# Patient Record
Sex: Male | Born: 1935 | Race: White | Hispanic: No | Marital: Married | Smoking: Former smoker
Health system: Southern US, Community
[De-identification: ages and names within clinical notes are randomized; demographics above are authoritative.]

## PROBLEM LIST (undated history)

## (undated) DIAGNOSIS — C801 Malignant (primary) neoplasm, unspecified: Secondary | ICD-10-CM

## (undated) DIAGNOSIS — I2510 Atherosclerotic heart disease of native coronary artery without angina pectoris: Secondary | ICD-10-CM

## (undated) DIAGNOSIS — E119 Type 2 diabetes mellitus without complications: Secondary | ICD-10-CM

## (undated) DIAGNOSIS — F329 Major depressive disorder, single episode, unspecified: Secondary | ICD-10-CM

## (undated) DIAGNOSIS — N135 Crossing vessel and stricture of ureter without hydronephrosis: Secondary | ICD-10-CM

## (undated) DIAGNOSIS — Z87442 Personal history of urinary calculi: Secondary | ICD-10-CM

## (undated) DIAGNOSIS — I714 Abdominal aortic aneurysm, without rupture, unspecified: Secondary | ICD-10-CM

## (undated) DIAGNOSIS — G4730 Sleep apnea, unspecified: Secondary | ICD-10-CM

## (undated) DIAGNOSIS — T8859XA Other complications of anesthesia, initial encounter: Secondary | ICD-10-CM

## (undated) DIAGNOSIS — F32A Depression, unspecified: Secondary | ICD-10-CM

## (undated) DIAGNOSIS — M1990 Unspecified osteoarthritis, unspecified site: Secondary | ICD-10-CM

## (undated) DIAGNOSIS — H269 Unspecified cataract: Secondary | ICD-10-CM

## (undated) DIAGNOSIS — E538 Deficiency of other specified B group vitamins: Secondary | ICD-10-CM

## (undated) DIAGNOSIS — N200 Calculus of kidney: Secondary | ICD-10-CM

## (undated) DIAGNOSIS — T4145XA Adverse effect of unspecified anesthetic, initial encounter: Secondary | ICD-10-CM

## (undated) DIAGNOSIS — K922 Gastrointestinal hemorrhage, unspecified: Secondary | ICD-10-CM

## (undated) DIAGNOSIS — K5792 Diverticulitis of intestine, part unspecified, without perforation or abscess without bleeding: Secondary | ICD-10-CM

## (undated) DIAGNOSIS — C61 Malignant neoplasm of prostate: Secondary | ICD-10-CM

## (undated) DIAGNOSIS — K449 Diaphragmatic hernia without obstruction or gangrene: Secondary | ICD-10-CM

## (undated) DIAGNOSIS — Z5189 Encounter for other specified aftercare: Secondary | ICD-10-CM

## (undated) DIAGNOSIS — D649 Anemia, unspecified: Secondary | ICD-10-CM

## (undated) DIAGNOSIS — Z9889 Other specified postprocedural states: Secondary | ICD-10-CM

## (undated) DIAGNOSIS — I10 Essential (primary) hypertension: Secondary | ICD-10-CM

## (undated) DIAGNOSIS — D500 Iron deficiency anemia secondary to blood loss (chronic): Secondary | ICD-10-CM

## (undated) DIAGNOSIS — E785 Hyperlipidemia, unspecified: Secondary | ICD-10-CM

## (undated) DIAGNOSIS — R112 Nausea with vomiting, unspecified: Secondary | ICD-10-CM

## (undated) DIAGNOSIS — K219 Gastro-esophageal reflux disease without esophagitis: Secondary | ICD-10-CM

## (undated) HISTORY — PX: CORONARY ARTERY BYPASS GRAFT: SHX141

## (undated) HISTORY — DX: Encounter for other specified aftercare: Z51.89

## (undated) HISTORY — DX: Type 2 diabetes mellitus without complications: E11.9

## (undated) HISTORY — DX: Unspecified cataract: H26.9

## (undated) HISTORY — DX: Abdominal aortic aneurysm, without rupture, unspecified: I71.40

## (undated) HISTORY — PX: APPENDECTOMY: SHX54

## (undated) HISTORY — DX: Hyperlipidemia, unspecified: E78.5

## (undated) HISTORY — DX: Unspecified osteoarthritis, unspecified site: M19.90

## (undated) HISTORY — DX: Major depressive disorder, single episode, unspecified: F32.9

## (undated) HISTORY — DX: Gastrointestinal hemorrhage, unspecified: K92.2

## (undated) HISTORY — DX: Iron deficiency anemia secondary to blood loss (chronic): D50.0

## (undated) HISTORY — DX: Gastro-esophageal reflux disease without esophagitis: K21.9

## (undated) HISTORY — DX: Deficiency of other specified B group vitamins: E53.8

## (undated) HISTORY — PX: BACK SURGERY: SHX140

## (undated) HISTORY — DX: Diverticulitis of intestine, part unspecified, without perforation or abscess without bleeding: K57.92

## (undated) HISTORY — PX: CATARACT EXTRACTION: SUR2

## (undated) HISTORY — PX: HERNIA REPAIR: SHX51

## (undated) HISTORY — DX: Depression, unspecified: F32.A

## (undated) HISTORY — DX: Essential (primary) hypertension: I10

## (undated) HISTORY — PX: GIVENS CAPSULE STUDY: SHX5432

## (undated) HISTORY — PX: CYSTOSCOPY: SUR368

## (undated) HISTORY — PX: CHOLECYSTECTOMY: SHX55

## (undated) HISTORY — DX: Diaphragmatic hernia without obstruction or gangrene: K44.9

## (undated) HISTORY — PX: SPINE SURGERY: SHX786

## (undated) HISTORY — DX: Abdominal aortic aneurysm, without rupture: I71.4

## (undated) HISTORY — PX: DESCENDING AORTIC ANEURYSM REPAIR W/ STENT: SHX1456

---

## 2006-12-30 DIAGNOSIS — E119 Type 2 diabetes mellitus without complications: Secondary | ICD-10-CM

## 2006-12-30 HISTORY — DX: Type 2 diabetes mellitus without complications: E11.9

## 2013-04-22 DIAGNOSIS — I714 Abdominal aortic aneurysm, without rupture: Secondary | ICD-10-CM | POA: Insufficient documentation

## 2013-07-14 ENCOUNTER — Encounter: Payer: Self-pay | Admitting: Cardiology

## 2013-08-27 ENCOUNTER — Ambulatory Visit (INDEPENDENT_AMBULATORY_CARE_PROVIDER_SITE_OTHER): Payer: Medicare Other | Admitting: Cardiovascular Disease

## 2013-08-27 ENCOUNTER — Encounter: Payer: Self-pay | Admitting: Cardiovascular Disease

## 2013-08-27 VITALS — BP 124/82 | HR 65 | Ht 74.0 in | Wt 210.0 lb

## 2013-08-27 DIAGNOSIS — I10 Essential (primary) hypertension: Secondary | ICD-10-CM

## 2013-08-27 DIAGNOSIS — I25810 Atherosclerosis of coronary artery bypass graft(s) without angina pectoris: Secondary | ICD-10-CM | POA: Insufficient documentation

## 2013-08-27 DIAGNOSIS — Z136 Encounter for screening for cardiovascular disorders: Secondary | ICD-10-CM

## 2013-08-27 DIAGNOSIS — Z951 Presence of aortocoronary bypass graft: Secondary | ICD-10-CM | POA: Insufficient documentation

## 2013-08-27 DIAGNOSIS — E785 Hyperlipidemia, unspecified: Secondary | ICD-10-CM | POA: Insufficient documentation

## 2013-08-27 NOTE — Progress Notes (Signed)
Patient ID: Andrew Zhang, male   DOB: Dec 20, 1934, 79 y.o.   MRN: [id number]    CARDIOLOGY CONSULT NOTE  Patient ID: Andrew Zhang MRN: [id number] DOB/AGE: [birth year] 79 y.o.  HPI: Andrew Zhang has a h/o DM II, HTN, PVD, and CAD, with a h/o CABG in 02/1994 by Dr. Nils Zhang with a LIMA to LAD, SVG to D1, and SVG to RCA. The patient denies any symptoms of chest pain, palpitations, shortness of breath, lightheadedness, dizziness, leg swelling, orthopnea, PND, and syncope.  He has been offered stress testing in the past with his former cardiologist, but the patient was leary and refused it.   Review of systems complete and found to be negative unless listed above in HPI  Past Medical History: see HPI   No family history on file.  History   Social History  . Marital Status: Unknown    Spouse Name: N/A    Number of Children: N/A  . Years of Education: N/A   Occupational History  . Not on file.   Social History Main Topics  . Smoking status: Former Research scientist (life sciences)  . Smokeless tobacco: Not on file  . Alcohol Use: Yes     Comment: 2-3 can of beer daily  . Drug Use: No  . Sexual Activity: Not on file   Other Topics Concern  . Not on file   Social History Narrative  . No narrative on file      Physical exam Blood pressure 124/82, pulse 65, height 6\' 2"  (1.88 m), weight 210 lb (95.255 kg). General: NAD Neck: No JVD, no thyromegaly or thyroid nodule.  Lungs: Clear to auscultation bilaterally with normal respiratory effort. CV: Nondisplaced PMI.  Heart regular S1/S2, no S3/S4, no murmur.  No peripheral edema.  No carotid bruit.  Normal pedal pulses.  Abdomen: Soft, nontender, no hepatosplenomegaly, no distention.  Skin: Intact without lesions or rashes.  Neurologic: Alert and oriented x 3.  Psych: Normal affect. Extremities: No clubbing or cyanosis.  HEENT: Normal.   Labs:   No results found for this basename: WBC, HGB, HCT, MCV, PLT   No results found for this basename: NA,  K, CL, CO2, BUN, CREATININE, CALCIUM, LABALBU, PROT, BILITOT, ALKPHOS, ALT, AST, GLUCOSE,  in the last 168 hours No results found for this basename: CKTOTAL, CKMB, CKMBINDEX, TROPONINI    No results found for this basename: CHOL   No results found for this basename: HDL   No results found for this basename: LDLCALC   No results found for this basename: TRIG   No results found for this basename: CHOLHDL   No results found for this basename: LDLDIRECT       EKG: Sinus rhythm with an LBBB and non-conducted PAC     ASSESSMENT AND PLAN:  1. CAD s/p 3-vessel CABG: symptomatically stable. On adequate medical therapy. Will obtain results of his prior echocardiogram and stress test. 2. HTN: controlled on present therapy. No adjustments necessary. 3. Hyperlipidemia: will see if he has had a recent lipid panel at his PCP's (Dr. Wenda Zhang) office.  Signed: Kate Zhang, M.D., F.A.C.C. 08/27/2013, 9:27 AM

## 2013-08-27 NOTE — Patient Instructions (Signed)
Continue all current medications. Your physician wants you to follow up in: 6 months.  You will receive a reminder letter in the mail one-two months in advance.  If you don't receive a letter, please call our office to schedule the follow up appointment   

## 2013-09-01 ENCOUNTER — Telehealth: Payer: Self-pay | Admitting: *Deleted

## 2013-09-01 NOTE — Telephone Encounter (Signed)
Message copied by Laurine Blazer on Wed Sep 01, 2013 11:44 AM ------      Message from: Kate Sable A      Created: Tue Aug 31, 2013 12:24 PM       No changes. No need for additional labs at this time.            ----- Message -----         From: Laurine Blazer, LPN         Sent: [id number]  11:15 AM           To: Herminio Commons, MD            Patient last seen 08/27/2013 - you requested recent labs.             ##  Hyperlipidemia: will see if he has had a recent lipid panel at his PCP's (Dr. Wenda Overland) office.            SEE LABS SCANNED IN - LABS DONE 03/26/2013.              Sunday Spillers                          ------

## 2013-11-01 ENCOUNTER — Encounter: Payer: Self-pay | Admitting: Cardiovascular Disease

## 2013-12-14 DIAGNOSIS — E785 Hyperlipidemia, unspecified: Secondary | ICD-10-CM | POA: Insufficient documentation

## 2014-02-28 ENCOUNTER — Ambulatory Visit: Payer: Medicare Other | Admitting: Cardiovascular Disease

## 2014-02-28 ENCOUNTER — Encounter: Payer: Self-pay | Admitting: Cardiovascular Disease

## 2014-03-28 ENCOUNTER — Ambulatory Visit (INDEPENDENT_AMBULATORY_CARE_PROVIDER_SITE_OTHER): Payer: Medicare Other | Admitting: Cardiovascular Disease

## 2014-03-28 ENCOUNTER — Encounter: Payer: Self-pay | Admitting: Cardiovascular Disease

## 2014-03-28 VITALS — BP 130/75 | HR 78 | Ht 70.0 in | Wt 211.0 lb

## 2014-03-28 DIAGNOSIS — C61 Malignant neoplasm of prostate: Secondary | ICD-10-CM

## 2014-03-28 DIAGNOSIS — I25810 Atherosclerosis of coronary artery bypass graft(s) without angina pectoris: Secondary | ICD-10-CM

## 2014-03-28 DIAGNOSIS — Z951 Presence of aortocoronary bypass graft: Secondary | ICD-10-CM

## 2014-03-28 DIAGNOSIS — I10 Essential (primary) hypertension: Secondary | ICD-10-CM

## 2014-03-28 DIAGNOSIS — E785 Hyperlipidemia, unspecified: Secondary | ICD-10-CM

## 2014-03-28 MED ORDER — NITROGLYCERIN 0.4 MG SL SUBL: 0.4000 mg | SUBLINGUAL_TABLET | SUBLINGUAL | Status: DC | PRN

## 2014-03-28 NOTE — Patient Instructions (Signed)
   Nitroglycerin as needed for severe chest pain - new sent to pharm Continue all other medications.   Your physician wants you to follow up in: 6 months.  You will receive a reminder letter in the mail one-two months in advance.  If you don't receive a letter, please call our office to schedule the follow up appointment

## 2014-03-28 NOTE — Progress Notes (Signed)
Patient ID: Andrew Zhang, male   DOB: 13-Mar-1934, 80 y.o.   MRN: [id number]      SUBJECTIVE: Andrew Zhang has a h/o DM II, HTN, PVD, and CAD, with a h/o CABG in 02/1994 by Dr. Nils Pyle with a LIMA to LAD, SVG to D1, and SVG to RCA.  The patient denies any symptoms of palpitations, shortness of breath, leg swelling, orthopnea, PND, and syncope. He occasionally has some lightheadedness due to vertigo. He occasionally experiences some back pain which radiates to his chest and may last 3 minutes, and then resolves spontaneously. It occurs moreso when he lies down on his right side which then resolves when he sits up.  He also has adenocarcinoma of the prostate, and he tells me it is well controlled with a recently normal PSA and no evidence of metastasis. He sees Dr. Exie Parody.     Allergies  Allergen Reactions  . Penicillins     Current Outpatient Prescriptions  Medication Sig Dispense Refill  . aspirin 81 MG tablet Take 81 mg by mouth daily.      . finasteride (PROSCAR) 5 MG tablet Take 5 mg by mouth daily.      Marland Kitchen glimepiride (AMARYL) 2 MG tablet Take 2 mg by mouth 2 (two) times daily.       Marland Kitchen lisinopril (PRINIVIL,ZESTRIL) 20 MG tablet Take 10 mg by mouth daily.      . metFORMIN (GLUMETZA) 500 MG (MOD) 24 hr tablet Take 500 mg by mouth daily with breakfast.      . omeprazole (PRILOSEC) 20 MG capsule Take 20 mg by mouth daily.      . sertraline (ZOLOFT) 100 MG tablet Take 50 mg by mouth daily.      . simvastatin (ZOCOR) 80 MG tablet Take 80 mg by mouth at bedtime.      . tamsulosin (FLOMAX) 0.4 MG CAPS Take 0.4 mg by mouth daily after supper.       No current facility-administered medications for this visit.    Past Medical History  Diagnosis Date  . Hiatal hernia   . DMII (diabetes mellitus, type 2) 2008  . Hyperlipidemia   . Depression   . GERD (gastroesophageal reflux disease)   . B12 deficiency   . Diverticulitis   . HTN (hypertension)   . Arthritis   . AAA (abdominal  aortic aneurysm)     No past surgical history on file.  History   Social History  . Marital Status: Married    Spouse Name: N/A    Number of Children: N/A  . Years of Education: N/A   Occupational History  . Not on file.   Social History Main Topics  . Smoking status: Former Research scientist (life sciences)  . Smokeless tobacco: Not on file  . Alcohol Use: Yes     Comment: 2-3 can of beer daily  . Drug Use: No  . Sexual Activity: Not on file   Other Topics Concern  . Not on file   Social History Narrative  . No narrative on file     Filed Vitals:   03/28/14 1014  BP: 130/75  Pulse: 78  Height: 5\' 10"  (1.778 m)  Weight: 211 lb (95.709 kg)  SpO2: 95%    PHYSICAL EXAM General: NAD Neck: No JVD, no thyromegaly. Lungs: Clear to auscultation bilaterally with normal respiratory effort. CV: Nondisplaced PMI.  Regular rate and rhythm, normal S1/S2, no S3/S4, no murmur. No pretibial or periankle edema.  No carotid bruit.  Normal pedal  pulses.  Abdomen: Soft, nontender, no hepatosplenomegaly, no distention.  Neurologic: Alert and oriented x 3.  Psych: Normal affect. Extremities: No clubbing or cyanosis.   ECG: reviewed and available in electronic records.      ASSESSMENT AND PLAN: 1. CAD s/p 3-vessel CABG: symptomatically stable with atypical symptoms, which appear to me more positional. On adequate medical therapy. I will refill SL nitroglycerin. No indication for noninvasive testing at this time. 2. HTN: controlled on present therapy. No adjustments necessary.  3. Hyperlipidemia: will continue present medical therapy. I will check to see if a lipid panel was recently performed by his PCP. If not, I will order a lipid panel. 4. Adenocarcinoma of the prostate: follows with Dr. Exie Parody.  Dispo: f/u 6 months.  Kate Sable, M.D., F.A.C.C.

## 2014-09-06 ENCOUNTER — Ambulatory Visit (INDEPENDENT_AMBULATORY_CARE_PROVIDER_SITE_OTHER): Payer: Medicare Other | Admitting: Cardiovascular Disease

## 2014-09-06 ENCOUNTER — Encounter: Payer: Self-pay | Admitting: *Deleted

## 2014-09-06 ENCOUNTER — Encounter: Payer: Self-pay | Admitting: Cardiovascular Disease

## 2014-09-06 VITALS — BP 112/66 | HR 77 | Ht 70.0 in | Wt 212.0 lb

## 2014-09-06 DIAGNOSIS — I209 Angina pectoris, unspecified: Secondary | ICD-10-CM

## 2014-09-06 DIAGNOSIS — I714 Abdominal aortic aneurysm, without rupture, unspecified: Secondary | ICD-10-CM

## 2014-09-06 DIAGNOSIS — I10 Essential (primary) hypertension: Secondary | ICD-10-CM

## 2014-09-06 DIAGNOSIS — E1140 Type 2 diabetes mellitus with diabetic neuropathy, unspecified: Secondary | ICD-10-CM | POA: Insufficient documentation

## 2014-09-06 DIAGNOSIS — I6523 Occlusion and stenosis of bilateral carotid arteries: Secondary | ICD-10-CM

## 2014-09-06 DIAGNOSIS — I658 Occlusion and stenosis of other precerebral arteries: Secondary | ICD-10-CM

## 2014-09-06 DIAGNOSIS — E119 Type 2 diabetes mellitus without complications: Secondary | ICD-10-CM

## 2014-09-06 DIAGNOSIS — I6529 Occlusion and stenosis of unspecified carotid artery: Secondary | ICD-10-CM

## 2014-09-06 DIAGNOSIS — I25708 Atherosclerosis of coronary artery bypass graft(s), unspecified, with other forms of angina pectoris: Secondary | ICD-10-CM

## 2014-09-06 DIAGNOSIS — I25810 Atherosclerosis of coronary artery bypass graft(s) without angina pectoris: Secondary | ICD-10-CM

## 2014-09-06 DIAGNOSIS — E785 Hyperlipidemia, unspecified: Secondary | ICD-10-CM

## 2014-09-06 DIAGNOSIS — C61 Malignant neoplasm of prostate: Secondary | ICD-10-CM | POA: Insufficient documentation

## 2014-09-06 DIAGNOSIS — R079 Chest pain, unspecified: Secondary | ICD-10-CM

## 2014-09-06 NOTE — Progress Notes (Signed)
Patient ID: Andrew Zhang, male   DOB: 19-Aug-1934, 80 y.o.   MRN: [id number]      SUBJECTIVE: Andrew Zhang has a history of DM II, HTN, PVD, and CAD, with a h/o CABG in 02/1994 by Dr. Nils Pyle with a LIMA to LAD, SVG to D1, and SVG to RCA.  Approximately 2 weeks ago, he had an episode of chest discomfort radiating to the back which was initially relieved with one sublingual nitroglycerin tablet. He had a recurrence approximately one hour later which was also relieved with one tablet. He does not walk much. He denies shortness of breath, palpitations, orthopnea and leg swelling. He completed radiation therapy for prostate adenocarcinoma this past June. He has been bothered by a dry nonproductive cough in the past 2 weeks.  ECG performed in the office today demonstrates normal sinus rhythm with an underlying left bundle branch block.  Review of Systems: As per "subjective", otherwise negative.  Allergies  Allergen Reactions  . Penicillins     Current Outpatient Prescriptions  Medication Sig Dispense Refill  . aspirin 81 MG tablet Take 81 mg by mouth daily.      . finasteride (PROSCAR) 5 MG tablet Take 5 mg by mouth daily.      Marland Kitchen glimepiride (AMARYL) 2 MG tablet Take 2 mg by mouth 2 (two) times daily.       Marland Kitchen lisinopril (PRINIVIL,ZESTRIL) 20 MG tablet Take 10 mg by mouth daily.      . metFORMIN (GLUMETZA) 500 MG (MOD) 24 hr tablet Take 500 mg by mouth daily with breakfast.      . nitroGLYCERIN (NITROSTAT) 0.4 MG SL tablet Place 1 tablet (0.4 mg total) under the tongue every 5 (five) minutes as needed for chest pain.  25 tablet  3  . omeprazole (PRILOSEC) 20 MG capsule Take 20 mg by mouth daily.      . sertraline (ZOLOFT) 100 MG tablet Take 50 mg by mouth daily.      . simvastatin (ZOCOR) 80 MG tablet Take 80 mg by mouth at bedtime.      . tamsulosin (FLOMAX) 0.4 MG CAPS Take 0.4 mg by mouth daily after supper.       No current facility-administered medications for this visit.     Past Medical History  Diagnosis Date  . Hiatal hernia   . DMII (diabetes mellitus, type 2) 2008  . Hyperlipidemia   . Depression   . GERD (gastroesophageal reflux disease)   . B12 deficiency   . Diverticulitis   . HTN (hypertension)   . Arthritis   . AAA (abdominal aortic aneurysm)     No past surgical history on file.  History   Social History  . Marital Status: Married    Spouse Name: N/A    Number of Children: N/A  . Years of Education: N/A   Occupational History  . Not on file.   Social History Main Topics  . Smoking status: Former Smoker -- 3.00 packs/day for 45 years    Types: Cigarettes    Start date: 04/19/1951    Quit date: 03/01/1994  . Smokeless tobacco: Never Used  . Alcohol Use: Yes     Comment: 2-3 can of beer daily  . Drug Use: No  . Sexual Activity: Not on file   Other Topics Concern  . Not on file   Social History Narrative  . No narrative on file     Filed Vitals:   09/06/14 1132  Height: 5\' 10"  (  1.778 m)  Weight: 212 lb (96.163 kg)    PHYSICAL EXAM General: NAD HEENT: Normal. Neck: No JVD, no thyromegaly. Lungs: Clear to auscultation bilaterally with normal respiratory effort. CV: Nondisplaced PMI.  Regular rate and rhythm, normal S1/S2, no S3/S4, no murmur. No pretibial or periankle edema.  No carotid bruit.  Normal pedal pulses.  Abdomen: Soft, nontender, no hepatosplenomegaly, no distention.  Neurologic: Alert and oriented x 3.  Psych: Normal affect. Skin: Normal. Musculoskeletal: Normal range of motion, no gross deformities. Extremities: No clubbing or cyanosis.   ECG: Most recent ECG reviewed.      ASSESSMENT AND PLAN: 1. CAD s/p 3-vessel CABG: Relatively stable ischemic heart disease with use of 2 SL nitro in past 6 months. As it has been several years since last ischemic evaluation and CABG 20 years ago, will obtain [employer]. Pt and wife requesting this. Continue present medication regimen with ASA and  statin. 2. Essential HTN: Controlled on present therapy. No adjustments necessary.  3. Hyperlipidemia: Will continue present medical therapy. Most recent lipids I have on record are from 10/2013. Will repeat if not already done by PCP. 4. Adenocarcinoma of the prostate: Completed radiation therapy in 05/2014, and follows with Dr. Exie Parody.  5. Type 2 diabetes mellitus: Stable on metformin, followed by PCP. 6. PVD: He follows with Dr. Sammuel Hines at [facility]. Carotid Dopplers performed in early August demonstrated less than 40% stenosis bilaterally. Abdominal aortic aneurysm was stable at 3.6 cm. There was a small type II endovascular leak from IMA deemed stable. Thyroid nodule was noted which was not deemed clinically significant. He had stable pulmonary nodules.  Dispo: f/u 6 months.   Kate Sable, M.D., F.A.C.C.

## 2014-09-06 NOTE — Patient Instructions (Signed)
   There were no changes to your medications. Continue as directed. Your physician has requested that you have a lexiscan myoview. For further information please visit [email or web address]. Please follow instruction sheet, as given. Office will contact with results via phone or letter.   Your physician wants you to follow up in: 6 months.  You will receive a reminder letter in the mail one-two months in advance.  If you don't receive a letter, please call our office to schedule the follow up appointment.

## 2014-09-14 ENCOUNTER — Encounter (HOSPITAL_COMMUNITY)
Admission: RE | Admit: 2014-09-14 | Discharge: 2014-09-14 | Disposition: A | Discharge status: Home / Self Care | Payer: Medicare Other | Source: Ambulatory Visit | Attending: Cardiovascular Disease | Admitting: Cardiovascular Disease

## 2014-09-14 ENCOUNTER — Encounter (HOSPITAL_COMMUNITY): Payer: Self-pay

## 2014-09-14 ENCOUNTER — Telehealth: Payer: Self-pay | Admitting: *Deleted

## 2014-09-14 DIAGNOSIS — I25810 Atherosclerosis of coronary artery bypass graft(s) without angina pectoris: Secondary | ICD-10-CM | POA: Insufficient documentation

## 2014-09-14 DIAGNOSIS — I209 Angina pectoris, unspecified: Secondary | ICD-10-CM | POA: Insufficient documentation

## 2014-09-14 DIAGNOSIS — I25708 Atherosclerosis of coronary artery bypass graft(s), unspecified, with other forms of angina pectoris: Secondary | ICD-10-CM

## 2014-09-14 DIAGNOSIS — I5189 Other ill-defined heart diseases: Secondary | ICD-10-CM | POA: Insufficient documentation

## 2014-09-14 DIAGNOSIS — I252 Old myocardial infarction: Secondary | ICD-10-CM | POA: Insufficient documentation

## 2014-09-14 DIAGNOSIS — R079 Chest pain, unspecified: Secondary | ICD-10-CM

## 2014-09-14 HISTORY — DX: Malignant (primary) neoplasm, unspecified: C80.1

## 2014-09-14 MED ORDER — REGADENOSON 0.4 MG/5ML IV SOLN
INTRAVENOUS | Status: AC
  Administered 2014-09-14: 0.4 mg via INTRAVENOUS
  Filled 2014-09-14: qty 5

## 2014-09-14 MED ORDER — TECHNETIUM TC 99M SESTAMIBI - CARDIOLITE
10.0000 | Freq: Once | INTRAVENOUS | Status: AC | PRN
  Administered 2014-09-14: 09:00:00 10 via INTRAVENOUS

## 2014-09-14 MED ORDER — TECHNETIUM TC 99M SESTAMIBI GENERIC - CARDIOLITE
30.0000 | Freq: Once | INTRAVENOUS | Status: AC | PRN
  Administered 2014-09-14: 30 via INTRAVENOUS

## 2014-09-14 MED ORDER — SODIUM CHLORIDE 0.9 % IJ SOLN
10.0000 mL | INTRAMUSCULAR | Status: DC | PRN
  Administered 2014-09-14: 10 mL via INTRAVENOUS

## 2014-09-14 MED ORDER — REGADENOSON 0.4 MG/5ML IV SOLN
0.4000 mg | Freq: Once | INTRAVENOUS | Status: AC | PRN
  Administered 2014-09-14: 0.4 mg via INTRAVENOUS

## 2014-09-14 MED ORDER — SODIUM CHLORIDE 0.9 % IJ SOLN
INTRAMUSCULAR | Status: AC
  Administered 2014-09-14: 10 mL via INTRAVENOUS
  Filled 2014-09-14: qty 10

## 2014-09-14 NOTE — Telephone Encounter (Signed)
Pt notified of results

## 2014-09-14 NOTE — Telephone Encounter (Signed)
Message copied by Orion Modest on Wed Sep 14, 2014  3:07 PM ------      Message from: Kate Sable A      Created: Wed Sep 14, 2014  2:35 PM       Myocardial scar, no ischemia. No need for cath. Continue present therapy. ------

## 2014-09-14 NOTE — Progress Notes (Signed)
Stress Lab Nurses Notes - Andrew Zhang 09/14/2014 Reason for doing test: CAD and chest discomfort Type of test: Wille Glaser Nurse performing test: Gerrit Halls, RN Nuclear Medicine Tech: Melburn Hake Echo Tech: Not Applicable MD performing test: Koneswaran/K.LawrenceNP Family MD: Bluth Test explained and consent signed: Yes.   IV started: Saline lock flushed, No redness or edema and Saline lock started in radiology Symptoms: dizziness & nausea Treatment/Intervention: None Reason test stopped: protocol completed After recovery IV was: Discontinued via X-ray tech, No redness or edema and Saline Lock flushed Patient to return to [name]. Med at : 12:10 Patient discharged: Home Patient's Condition upon discharge was: stable Comments: During test BP 122/62 & HR 80.  Recovery BP 120/62 & HR 75.  Symptoms resolved in recovery. [name] T

## 2015-03-31 HISTORY — PX: COLONOSCOPY: SHX174

## 2015-05-01 ENCOUNTER — Telehealth: Payer: Self-pay | Admitting: Cardiovascular Disease

## 2015-05-01 ENCOUNTER — Encounter: Payer: Self-pay | Admitting: Cardiovascular Disease

## 2015-05-01 NOTE — Telephone Encounter (Signed)
I've scheduled him to see Dr. Bronson Ing on Monday May 16th to hold an appt.slot if he can wait until then to be seen.

## 2015-05-01 NOTE — Telephone Encounter (Signed)
Spoke with patient and he c/o discomfort in his chest that felt like digestion rated 7-8 on a scale of 1-10 (10 being the greatest) that started a week ago and said it radiates to the middle of his back. Patient said it feels more like the indigestion that he has from his hiatal hernia. Patient said he took something for indigestion but the discomfort didn't go away but after taking nitroglycerin, the discomfort improved. Patient also said that he felt the same discomfort again and took another nitroglycerin about 12 hours later. Patient also c/o dizziness that occurred once today. Patient denied, sob. Patient was given an appointment to see Dr. Bronson Ing on Wednesday at the [facility] office and informed that if his symptoms reoccur or get worse, that he needed to go to the ED for an evaluation. Patient verbalized understanding of plan.

## 2015-05-02 ENCOUNTER — Encounter: Payer: Self-pay | Admitting: Cardiovascular Disease

## 2015-05-03 ENCOUNTER — Ambulatory Visit: Payer: Medicare Other | Admitting: Cardiovascular Disease

## 2015-05-15 ENCOUNTER — Ambulatory Visit: Payer: Medicare Other | Admitting: Cardiovascular Disease

## 2015-05-16 ENCOUNTER — Encounter: Payer: Self-pay | Admitting: *Deleted

## 2015-05-18 ENCOUNTER — Encounter: Payer: Self-pay | Admitting: Cardiovascular Disease

## 2015-05-18 ENCOUNTER — Ambulatory Visit (INDEPENDENT_AMBULATORY_CARE_PROVIDER_SITE_OTHER): Payer: Medicare Other | Admitting: Cardiovascular Disease

## 2015-05-18 VITALS — BP 130/74 | HR 73 | Ht 74.0 in | Wt 210.0 lb

## 2015-05-18 DIAGNOSIS — C61 Malignant neoplasm of prostate: Secondary | ICD-10-CM

## 2015-05-18 DIAGNOSIS — Z9289 Personal history of other medical treatment: Secondary | ICD-10-CM

## 2015-05-18 DIAGNOSIS — I6523 Occlusion and stenosis of bilateral carotid arteries: Secondary | ICD-10-CM

## 2015-05-18 DIAGNOSIS — Z87898 Personal history of other specified conditions: Secondary | ICD-10-CM

## 2015-05-18 DIAGNOSIS — E119 Type 2 diabetes mellitus without complications: Secondary | ICD-10-CM

## 2015-05-18 DIAGNOSIS — Z951 Presence of aortocoronary bypass graft: Secondary | ICD-10-CM | POA: Diagnosis not present

## 2015-05-18 DIAGNOSIS — R079 Chest pain, unspecified: Secondary | ICD-10-CM

## 2015-05-18 DIAGNOSIS — I10 Essential (primary) hypertension: Secondary | ICD-10-CM | POA: Diagnosis not present

## 2015-05-18 DIAGNOSIS — I25708 Atherosclerosis of coronary artery bypass graft(s), unspecified, with other forms of angina pectoris: Secondary | ICD-10-CM | POA: Diagnosis not present

## 2015-05-18 DIAGNOSIS — I714 Abdominal aortic aneurysm, without rupture, unspecified: Secondary | ICD-10-CM

## 2015-05-18 DIAGNOSIS — E785 Hyperlipidemia, unspecified: Secondary | ICD-10-CM

## 2015-05-18 NOTE — Patient Instructions (Signed)
Continue all current medications. Follow up in  4-6 months

## 2015-05-18 NOTE — Progress Notes (Signed)
Patient ID: Andrew Zhang, male   DOB: 05/18/1934, 81 y.o.   MRN: [id number]      SUBJECTIVE: Andrew Zhang has a history of DM II, HTN, PVD, and CAD, with a h/o CABG in 02/1994 by Dr. Nils Pyle with a LIMA to LAD, SVG to D1, and SVG to RCA.   Nuclear stress test on 09/14/14 demonstrated myocardial scar in the inferior wall extending from the apex to the base with no evidence of ischemia. Calculated LVEF 50%.  He was evaluated in the ED at [facility] on 05/01/15 for dizziness and abdominal pain. He took 2 sublingual nitroglycerin tablets 12 hours apart which gave him some mild relief. Chest x-ray demonstrated pulmonary vascular congestion. ECG showed sinus rhythm with left bundle branch block. Relevant labs showed BUN 30, creatinine 1.6, albumin 3.4, normal troponin, hemoglobin 10.4, platelets 106.  He currently denies any exertional chest pain and shortness of breath. His wife thinks he has some shortness of breath after walking about 100 or more yards. Lipids performed on 03/09/15 showed total cholesterol 122, HDL 39, LDL 10, TG 365.   Review of Systems: As per "subjective", otherwise negative.  Allergies  Allergen Reactions  . Penicillins     Current Outpatient Prescriptions  Medication Sig Dispense Refill  . aspirin 81 MG tablet Take 81 mg by mouth daily.    . Calcium Carbonate-Vitamin D (CALTRATE 600+D) 600-400 MG-UNIT per tablet Take 1 tablet by mouth daily.    . finasteride (PROSCAR) 5 MG tablet Take 5 mg by mouth daily.    Marland Kitchen glimepiride (AMARYL) 2 MG tablet Take 2 mg by mouth 2 (two) times daily.     Marland Kitchen lisinopril (PRINIVIL,ZESTRIL) 2.5 MG tablet Take 2.5 mg by mouth daily.    . metFORMIN (GLUMETZA) 500 MG (MOD) 24 hr tablet Take 500 mg by mouth daily with breakfast.    . nitroGLYCERIN (NITROSTAT) 0.4 MG SL tablet Place 1 tablet (0.4 mg total) under the tongue every 5 (five) minutes as needed for chest pain. 25 tablet 3  . omeprazole (PRILOSEC) 20 MG capsule Take 20 mg by  mouth daily.    . sertraline (ZOLOFT) 100 MG tablet Take 50 mg by mouth daily.    . simvastatin (ZOCOR) 80 MG tablet Take 80 mg by mouth at bedtime.    . tamsulosin (FLOMAX) 0.4 MG CAPS Take 0.4 mg by mouth daily after supper.     No current facility-administered medications for this visit.    Past Medical History  Diagnosis Date  . Hiatal hernia   . DMII (diabetes mellitus, type 2) 2008  . Hyperlipidemia   . Depression   . GERD (gastroesophageal reflux disease)   . B12 deficiency   . Diverticulitis   . HTN (hypertension)   . Arthritis   . AAA (abdominal aortic aneurysm)   . Cancer     No past surgical history on file.  History   Social History  . Marital Status: Married    Spouse Name: N/A  . Number of Children: N/A  . Years of Education: N/A   Occupational History  . Not on file.   Social History Main Topics  . Smoking status: Former Smoker -- 3.00 packs/day for 45 years    Types: Cigarettes    Start date: 04/19/1951    Quit date: 03/01/1994  . Smokeless tobacco: Never Used  . Alcohol Use: Yes     Comment: 2-3 can of beer daily  . Drug Use: No  . Sexual  Activity: Not on file   Other Topics Concern  . Not on file   Social History Narrative     Filed Vitals:   05/18/15 1258  BP: 130/74  Pulse: 73  Height: 6\' 2"  (1.88 m)  Weight: 210 lb (95.255 kg)  SpO2: 95%    PHYSICAL EXAM General: NAD HEENT: Normal. Neck: No JVD, no thyromegaly. Lungs: Clear to auscultation bilaterally with normal respiratory effort. CV: Nondisplaced PMI.  Regular rate and rhythm, normal S1/S2, no S3/S4, no murmur. No pretibial or periankle edema.  No carotid bruit.  Normal pedal pulses.  Abdomen: Soft, nontender, no hepatosplenomegaly, no distention.  Neurologic: Alert and oriented x 3.  Psych: Normal affect. Skin: Normal. Musculoskeletal: Normal range of motion, no gross deformities. Extremities: No clubbing or cyanosis.   ECG: Most recent ECG  reviewed.      ASSESSMENT AND PLAN: 1. CAD s/p 3-vessel CABG: Stable ischemic heart disease. Continue present medication regimen with ASA and statin. If recurrence of symptoms, would consider long-acting nitrates.  2. Essential HTN: Controlled on present therapy. No adjustments necessary.   3. Hyperlipidemia: Most recent results noted above. Continue simvastatin 80 mg.  4. Adenocarcinoma of the prostate: Completed radiation therapy in 05/2014, and follows with Dr. Exie Parody.   5. Type 2 diabetes mellitus: Stable on metformin, followed by PCP.  6. PVD: He follows with Dr. Sammuel Hines at [facility]. Carotid Dopplers performed in early August 2015 demonstrated less than 40% stenosis bilaterally. Abdominal aortic aneurysm was stable at 3.6 cm. There was a small type II endovascular leak from IMA deemed stable. Thyroid nodule was noted which was not deemed clinically significant. He had stable pulmonary nodules.  Dispo: f/u 4-6 months.  Time spent: 40 minutes, of which greater than 50% was spent reviewing symptoms, relevant blood tests and studies, and discussing management plan with the patient.  Kate Sable, M.D., F.A.C.C.

## 2015-09-01 ENCOUNTER — Encounter: Payer: Self-pay | Admitting: Cardiovascular Disease

## 2015-09-01 ENCOUNTER — Ambulatory Visit (INDEPENDENT_AMBULATORY_CARE_PROVIDER_SITE_OTHER): Payer: Medicare Other | Admitting: Cardiovascular Disease

## 2015-09-01 VITALS — BP 132/76 | HR 81 | Ht 72.0 in | Wt 213.8 lb

## 2015-09-01 DIAGNOSIS — I6523 Occlusion and stenosis of bilateral carotid arteries: Secondary | ICD-10-CM | POA: Diagnosis not present

## 2015-09-01 DIAGNOSIS — I25812 Atherosclerosis of bypass graft of coronary artery of transplanted heart without angina pectoris: Secondary | ICD-10-CM

## 2015-09-01 DIAGNOSIS — I714 Abdominal aortic aneurysm, without rupture, unspecified: Secondary | ICD-10-CM

## 2015-09-01 DIAGNOSIS — E785 Hyperlipidemia, unspecified: Secondary | ICD-10-CM | POA: Diagnosis not present

## 2015-09-01 DIAGNOSIS — I10 Essential (primary) hypertension: Secondary | ICD-10-CM

## 2015-09-01 DIAGNOSIS — Z951 Presence of aortocoronary bypass graft: Secondary | ICD-10-CM

## 2015-09-01 NOTE — Patient Instructions (Signed)
Continue all current medications. Your physician wants you to follow up in: 6 months.  You will receive a reminder letter in the mail one-two months in advance.  If you don't receive a letter, please call our office to schedule the follow up appointment   

## 2015-09-01 NOTE — Progress Notes (Signed)
Patient ID: [facility], male   DOB: 08-19-1934, 81 y.o.   MRN: [id number]      SUBJECTIVE: [facility] has a history of DM II, HTN, PVD, and CAD, with a h/o CABG in 02/1994 by Dr. Nils Zhang with a LIMA to LAD, SVG to D1, and SVG to RCA.   Nuclear stress test on 09/14/14 demonstrated myocardial scar in the inferior wall extending from the apex to the base with no evidence of ischemia. Calculated LVEF 50%.  Lipids performed on 03/09/15 showed total cholesterol 122, HDL 39, LDL 10, TG 365.  He currently denies any exertional chest pain and shortness of breath and is feeling very well.   Review of Systems: As per "subjective", otherwise negative.  Allergies  Allergen Reactions  . Penicillins     Current Outpatient Prescriptions  Medication Sig Dispense Refill  . aspirin 81 MG tablet Take 81 mg by mouth daily.    . Calcium Carbonate-Vitamin D (CALTRATE 600+D) 600-400 MG-UNIT per tablet Take 1 tablet by mouth daily.    . finasteride (PROSCAR) 5 MG tablet Take 5 mg by mouth daily.    Marland Kitchen glimepiride (AMARYL) 2 MG tablet Take 2 mg by mouth 2 (two) times daily.     Marland Kitchen lisinopril (PRINIVIL,ZESTRIL) 2.5 MG tablet Take 2.5 mg by mouth daily.    . metFORMIN (GLUMETZA) 500 MG (MOD) 24 hr tablet Take 500 mg by mouth daily with breakfast.    . nitroGLYCERIN (NITROSTAT) 0.4 MG SL tablet Place 1 tablet (0.4 mg total) under the tongue every 5 (five) minutes as needed for chest pain. 25 tablet 3  . omeprazole (PRILOSEC) 20 MG capsule Take 20 mg by mouth daily.    . sertraline (ZOLOFT) 100 MG tablet Take 50 mg by mouth daily.    . simvastatin (ZOCOR) 80 MG tablet Take 80 mg by mouth at bedtime.    . tamsulosin (FLOMAX) 0.4 MG CAPS Take 0.4 mg by mouth daily after supper.     No current facility-administered medications for this visit.    Past Medical History  Diagnosis Date  . Hiatal hernia   . DMII (diabetes mellitus, type 2) 2008  . Hyperlipidemia   . Depression   . GERD (gastroesophageal  reflux disease)   . B12 deficiency   . Diverticulitis   . HTN (hypertension)   . Arthritis   . AAA (abdominal aortic aneurysm)   . Cancer     No past surgical history on file.  Social History   Social History  . Marital Status: Married    Spouse Name: N/A  . Number of Children: N/A  . Years of Education: N/A   Occupational History  . Not on file.   Social History Main Topics  . Smoking status: Former Smoker -- 3.00 packs/day for 45 years    Types: Cigarettes    Start date: 04/19/1951    Quit date: 03/01/1994  . Smokeless tobacco: Never Used  . Alcohol Use: 0.0 oz/week    0 Standard drinks or equivalent per week     Comment: 2-3 can of beer daily  . Drug Use: No  . Sexual Activity: Not on file   Other Topics Concern  . Not on file   Social History Narrative     Filed Vitals:   09/01/15 1307  BP: 132/76  Pulse: 81  Height: 6' (1.829 m)  Weight: 213 lb 12.8 oz (96.979 kg)  SpO2: 94%    PHYSICAL EXAM General: NAD HEENT: Normal.  Neck: No JVD, no thyromegaly. Lungs: Clear to auscultation bilaterally with normal respiratory effort. CV: Nondisplaced PMI.  Regular rate and rhythm, normal S1/S2, no S3/S4, no murmur. No pretibial or periankle edema.  No carotid bruit.  Normal pedal pulses.  Abdomen: Soft, nontender, no hepatosplenomegaly, no distention.  Neurologic: Alert and oriented x 3.  Psych: Normal affect. Skin: Normal. Musculoskeletal: Normal range of motion, no gross deformities. Extremities: No clubbing or cyanosis.   ECG: Most recent ECG reviewed.      ASSESSMENT AND PLAN: 1. CAD s/p 3-vessel CABG: Stable ischemic heart disease. Continue present medication regimen with ASA and statin. If recurrence of symptoms, would consider long-acting nitrates.  2. Essential HTN: Controlled on present therapy. No adjustments necessary.   3. Hyperlipidemia: Most recent results noted above. Continue simvastatin 80 mg.  4. Adenocarcinoma of the prostate:  Completed radiation therapy in 05/2014, and follows with Dr. Exie Zhang.   5. Type 2 diabetes mellitus: Stable on metformin, followed by PCP.  6. PVD: He follows with Dr. Sammuel Zhang at [facility]. Carotid Dopplers performed in early August 2015 demonstrated less than 40% stenosis bilaterally. Abdominal aortic aneurysm was stable at 3.6 cm. There was a small type II endovascular leak from IMA deemed stable. Thyroid nodule was noted which was not deemed clinically significant. He had stable pulmonary nodules.  Dispo: f/u 6 months.   Kate Sable, M.D., F.A.C.C.

## 2016-01-31 DIAGNOSIS — H353121 Nonexudative age-related macular degeneration, left eye, early dry stage: Secondary | ICD-10-CM | POA: Diagnosis not present

## 2016-01-31 DIAGNOSIS — H2511 Age-related nuclear cataract, right eye: Secondary | ICD-10-CM | POA: Diagnosis not present

## 2016-01-31 DIAGNOSIS — E119 Type 2 diabetes mellitus without complications: Secondary | ICD-10-CM | POA: Diagnosis not present

## 2016-03-11 DIAGNOSIS — C61 Malignant neoplasm of prostate: Secondary | ICD-10-CM | POA: Diagnosis not present

## 2016-03-18 DIAGNOSIS — I872 Venous insufficiency (chronic) (peripheral): Secondary | ICD-10-CM | POA: Diagnosis not present

## 2016-03-18 DIAGNOSIS — E1165 Type 2 diabetes mellitus with hyperglycemia: Secondary | ICD-10-CM | POA: Diagnosis not present

## 2016-04-05 ENCOUNTER — Encounter: Payer: Self-pay | Admitting: Cardiovascular Disease

## 2016-04-05 ENCOUNTER — Ambulatory Visit (INDEPENDENT_AMBULATORY_CARE_PROVIDER_SITE_OTHER): Payer: Medicare Other | Admitting: Cardiovascular Disease

## 2016-04-05 VITALS — BP 142/68 | HR 68 | Ht 74.0 in | Wt 215.0 lb

## 2016-04-05 DIAGNOSIS — I714 Abdominal aortic aneurysm, without rupture, unspecified: Secondary | ICD-10-CM

## 2016-04-05 DIAGNOSIS — I10 Essential (primary) hypertension: Secondary | ICD-10-CM

## 2016-04-05 DIAGNOSIS — I25768 Atherosclerosis of bypass graft of coronary artery of transplanted heart with other forms of angina pectoris: Secondary | ICD-10-CM | POA: Diagnosis not present

## 2016-04-05 DIAGNOSIS — I6523 Occlusion and stenosis of bilateral carotid arteries: Secondary | ICD-10-CM | POA: Diagnosis not present

## 2016-04-05 DIAGNOSIS — E785 Hyperlipidemia, unspecified: Secondary | ICD-10-CM | POA: Diagnosis not present

## 2016-04-05 MED ORDER — NITROGLYCERIN 0.4 MG SL SUBL: 0.4000 mg | SUBLINGUAL_TABLET | SUBLINGUAL | Status: AC | PRN

## 2016-04-05 NOTE — Patient Instructions (Signed)
Your physician wants you to follow-up in: 1 year with Dr. Koneswaran.  You will receive a reminder letter in the mail two months in advance. If you don't receive a letter, please call our office to schedule the follow-up appointment.  Your physician recommends that you continue on your current medications as directed. Please refer to the Current Medication list given to you today.  Thank you for choosing [facility]!   

## 2016-04-05 NOTE — Progress Notes (Signed)
Patient ID: [facility], male   DOB: 1934/04/11, 82 y.o.   MRN: [id number]      SUBJECTIVE: [facility] has a history of DM II, HTN, PVD, and CAD, with a h/o CABG in 02/1994 by Dr. Nils Zhang with a LIMA to LAD, SVG to D1, and SVG to RCA.   Nuclear stress test on 09/14/14 demonstrated myocardial scar in the inferior wall extending from the apex to the base with no evidence of ischemia. Calculated LVEF 50%.  He has only had to use sublingual nitroglycerin on one or two occasions in the past several months. He is able to mow his lawn and perform his activities of daily living without difficulties. He denies significant exertional dyspnea as well as leg swelling.   He has been married to his wife for 1 years.   Review of Systems: As per "subjective", otherwise negative.  Allergies  Allergen Reactions  . Penicillins     Current Outpatient Prescriptions  Medication Sig Dispense Refill  . aspirin 81 MG tablet Take 81 mg by mouth daily.    . Calcium Carbonate-Vitamin D (CALTRATE 600+D) 600-400 MG-UNIT per tablet Take 1 tablet by mouth daily.    . finasteride (PROSCAR) 5 MG tablet Take 5 mg by mouth daily.    Marland Kitchen glimepiride (AMARYL) 2 MG tablet Take 2 mg by mouth 2 (two) times daily.     Marland Kitchen lisinopril (PRINIVIL,ZESTRIL) 2.5 MG tablet Take 2.5 mg by mouth daily.    . metFORMIN (GLUMETZA) 500 MG (MOD) 24 hr tablet Take 500 mg by mouth daily with breakfast.    . nitroGLYCERIN (NITROSTAT) 0.4 MG SL tablet Place 1 tablet (0.4 mg total) under the tongue every 5 (five) minutes as needed for chest pain. 25 tablet 3  . omeprazole (PRILOSEC) 20 MG capsule Take 20 mg by mouth daily.    . sertraline (ZOLOFT) 100 MG tablet Take 50 mg by mouth daily.    . simvastatin (ZOCOR) 80 MG tablet Take 80 mg by mouth at bedtime.    . tamsulosin (FLOMAX) 0.4 MG CAPS Take 0.4 mg by mouth daily after supper.     No current facility-administered medications for this visit.    Past Medical History  Diagnosis  Date  . Hiatal hernia   . DMII (diabetes mellitus, type 2) ([facility]) 2008  . Hyperlipidemia   . Depression   . GERD (gastroesophageal reflux disease)   . B12 deficiency   . Diverticulitis   . HTN (hypertension)   . Arthritis   . AAA (abdominal aortic aneurysm) ([facility])   . Cancer ([facility])     No past surgical history on file.  Social History   Social History  . Marital Status: Married    Spouse Name: N/A  . Number of Children: N/A  . Years of Education: N/A   Occupational History  . Not on file.   Social History Main Topics  . Smoking status: Former Smoker -- 3.00 packs/day for 45 years    Types: Cigarettes    Start date: 04/19/1951    Quit date: 03/01/1994  . Smokeless tobacco: Never Used  . Alcohol Use: 0.0 oz/week    0 Standard drinks or equivalent per week     Comment: 2-3 can of beer daily  . Drug Use: No  . Sexual Activity: Not on file   Other Topics Concern  . Not on file   Social History Narrative     Filed Vitals:   04/05/16 1312  BP: 142/68  Pulse: 68  Height: 6\' 2"  (1.88 m)  Weight: 215 lb (97.523 kg)  SpO2: 95%    PHYSICAL EXAM General: NAD HEENT: Normal. Neck: No JVD, no thyromegaly. Lungs: Clear to auscultation bilaterally with normal respiratory effort. CV: Nondisplaced PMI.  Regular rate and rhythm, normal S1/S2, no S3/S4, no murmur. No pretibial or periankle edema.  Soft b/l carotid bruits.   Abdomen: Soft, nontender, no distention.  Neurologic: Alert and oriented.  Psych: Normal affect. Skin: Normal. Musculoskeletal: No gross deformities.  ECG: Most recent ECG reviewed.      ASSESSMENT AND PLAN: 1. CAD s/p 3-vessel CABG: Stable ischemic heart disease. Continue present medication regimen with ASA and statin.  2. Essential HTN: Reasonably controlled for age on present therapy. No adjustments necessary.   3. Hyperlipidemia: Continue simvastatin 80 mg.  4. PVD: He follows with Dr. [facility]muel [facility] at [facility], most recently in 07/2015. Has mild  carotid artery stenosis bilaterally and AAA. There was a small type II endovascular leak from IMA deemed stable (EVAR in 2011).   Dispo: f/u 1 year.   [facility], M.D., F.A.C.C.

## 2016-06-13 DIAGNOSIS — C61 Malignant neoplasm of prostate: Secondary | ICD-10-CM | POA: Diagnosis not present

## 2016-06-18 DIAGNOSIS — C61 Malignant neoplasm of prostate: Secondary | ICD-10-CM | POA: Diagnosis not present

## 2016-06-26 DIAGNOSIS — E860 Dehydration: Secondary | ICD-10-CM | POA: Diagnosis not present

## 2016-06-26 DIAGNOSIS — C61 Malignant neoplasm of prostate: Secondary | ICD-10-CM | POA: Diagnosis not present

## 2016-06-26 DIAGNOSIS — J9811 Atelectasis: Secondary | ICD-10-CM | POA: Diagnosis not present

## 2016-06-26 DIAGNOSIS — I10 Essential (primary) hypertension: Secondary | ICD-10-CM | POA: Diagnosis not present

## 2016-06-26 DIAGNOSIS — Z79899 Other long term (current) drug therapy: Secondary | ICD-10-CM | POA: Diagnosis not present

## 2016-06-26 DIAGNOSIS — K219 Gastro-esophageal reflux disease without esophagitis: Secondary | ICD-10-CM | POA: Diagnosis not present

## 2016-06-26 DIAGNOSIS — R197 Diarrhea, unspecified: Secondary | ICD-10-CM | POA: Diagnosis not present

## 2016-06-26 DIAGNOSIS — Z7984 Long term (current) use of oral hypoglycemic drugs: Secondary | ICD-10-CM | POA: Diagnosis not present

## 2016-06-26 DIAGNOSIS — Z951 Presence of aortocoronary bypass graft: Secondary | ICD-10-CM | POA: Diagnosis not present

## 2016-06-26 DIAGNOSIS — Z8546 Personal history of malignant neoplasm of prostate: Secondary | ICD-10-CM | POA: Diagnosis not present

## 2016-06-26 DIAGNOSIS — R109 Unspecified abdominal pain: Secondary | ICD-10-CM | POA: Diagnosis not present

## 2016-06-26 DIAGNOSIS — Z7982 Long term (current) use of aspirin: Secondary | ICD-10-CM | POA: Diagnosis not present

## 2016-06-26 DIAGNOSIS — K529 Noninfective gastroenteritis and colitis, unspecified: Secondary | ICD-10-CM | POA: Diagnosis not present

## 2016-06-26 DIAGNOSIS — F419 Anxiety disorder, unspecified: Secondary | ICD-10-CM | POA: Diagnosis not present

## 2016-06-26 DIAGNOSIS — R1084 Generalized abdominal pain: Secondary | ICD-10-CM | POA: Diagnosis not present

## 2016-06-26 DIAGNOSIS — Z87440 Personal history of urinary (tract) infections: Secondary | ICD-10-CM | POA: Diagnosis not present

## 2016-06-26 DIAGNOSIS — E7800 Pure hypercholesterolemia, unspecified: Secondary | ICD-10-CM | POA: Diagnosis not present

## 2016-06-26 DIAGNOSIS — Z87891 Personal history of nicotine dependence: Secondary | ICD-10-CM | POA: Diagnosis not present

## 2016-06-26 DIAGNOSIS — A045 Campylobacter enteritis: Secondary | ICD-10-CM | POA: Diagnosis not present

## 2016-06-26 DIAGNOSIS — Z880 Allergy status to penicillin: Secondary | ICD-10-CM | POA: Diagnosis not present

## 2016-06-26 DIAGNOSIS — E119 Type 2 diabetes mellitus without complications: Secondary | ICD-10-CM | POA: Diagnosis not present

## 2016-06-28 DIAGNOSIS — A045 Campylobacter enteritis: Secondary | ICD-10-CM | POA: Diagnosis not present

## 2016-07-08 DIAGNOSIS — I10 Essential (primary) hypertension: Secondary | ICD-10-CM | POA: Diagnosis not present

## 2016-07-08 DIAGNOSIS — I129 Hypertensive chronic kidney disease with stage 1 through stage 4 chronic kidney disease, or unspecified chronic kidney disease: Secondary | ICD-10-CM | POA: Diagnosis not present

## 2016-07-08 DIAGNOSIS — N183 Chronic kidney disease, stage 3 (moderate): Secondary | ICD-10-CM | POA: Diagnosis not present

## 2016-07-17 DIAGNOSIS — L821 Other seborrheic keratosis: Secondary | ICD-10-CM | POA: Diagnosis not present

## 2016-07-17 DIAGNOSIS — L570 Actinic keratosis: Secondary | ICD-10-CM | POA: Diagnosis not present

## 2016-07-29 ENCOUNTER — Ambulatory Visit (INDEPENDENT_AMBULATORY_CARE_PROVIDER_SITE_OTHER): Payer: Medicare Other | Admitting: Cardiovascular Disease

## 2016-07-29 ENCOUNTER — Encounter: Payer: Self-pay | Admitting: Cardiovascular Disease

## 2016-07-29 ENCOUNTER — Encounter: Payer: Self-pay | Admitting: *Deleted

## 2016-07-29 ENCOUNTER — Inpatient Hospital Stay (HOSPITAL_COMMUNITY)
Admission: EM | Admit: 2016-07-29 | Discharge: 2016-07-31 | DRG: 378 | Disposition: A | Discharge status: Home / Self Care | Payer: Medicare Other | Attending: Family Medicine | Admitting: Family Medicine

## 2016-07-29 ENCOUNTER — Emergency Department (HOSPITAL_COMMUNITY): Payer: Medicare Other

## 2016-07-29 ENCOUNTER — Encounter (HOSPITAL_COMMUNITY): Payer: Self-pay | Admitting: Emergency Medicine

## 2016-07-29 ENCOUNTER — Telehealth: Payer: Self-pay | Admitting: *Deleted

## 2016-07-29 VITALS — BP 118/64 | HR 78 | Ht 74.0 in | Wt 213.0 lb

## 2016-07-29 DIAGNOSIS — K921 Melena: Principal | ICD-10-CM

## 2016-07-29 DIAGNOSIS — Z79899 Other long term (current) drug therapy: Secondary | ICD-10-CM

## 2016-07-29 DIAGNOSIS — E1122 Type 2 diabetes mellitus with diabetic chronic kidney disease: Secondary | ICD-10-CM | POA: Diagnosis present

## 2016-07-29 DIAGNOSIS — I2510 Atherosclerotic heart disease of native coronary artery without angina pectoris: Secondary | ICD-10-CM | POA: Diagnosis present

## 2016-07-29 DIAGNOSIS — K311 Adult hypertrophic pyloric stenosis: Secondary | ICD-10-CM | POA: Diagnosis not present

## 2016-07-29 DIAGNOSIS — D61818 Other pancytopenia: Secondary | ICD-10-CM

## 2016-07-29 DIAGNOSIS — R0602 Shortness of breath: Secondary | ICD-10-CM

## 2016-07-29 DIAGNOSIS — I129 Hypertensive chronic kidney disease with stage 1 through stage 4 chronic kidney disease, or unspecified chronic kidney disease: Secondary | ICD-10-CM | POA: Diagnosis present

## 2016-07-29 DIAGNOSIS — Z7984 Long term (current) use of oral hypoglycemic drugs: Secondary | ICD-10-CM

## 2016-07-29 DIAGNOSIS — I209 Angina pectoris, unspecified: Secondary | ICD-10-CM

## 2016-07-29 DIAGNOSIS — I6523 Occlusion and stenosis of bilateral carotid arteries: Secondary | ICD-10-CM

## 2016-07-29 DIAGNOSIS — N179 Acute kidney failure, unspecified: Secondary | ICD-10-CM | POA: Diagnosis not present

## 2016-07-29 DIAGNOSIS — K449 Diaphragmatic hernia without obstruction or gangrene: Secondary | ICD-10-CM | POA: Diagnosis present

## 2016-07-29 DIAGNOSIS — I714 Abdominal aortic aneurysm, without rupture, unspecified: Secondary | ICD-10-CM

## 2016-07-29 DIAGNOSIS — I25810 Atherosclerosis of coronary artery bypass graft(s) without angina pectoris: Secondary | ICD-10-CM | POA: Diagnosis present

## 2016-07-29 DIAGNOSIS — D696 Thrombocytopenia, unspecified: Secondary | ICD-10-CM | POA: Diagnosis not present

## 2016-07-29 DIAGNOSIS — D62 Acute posthemorrhagic anemia: Secondary | ICD-10-CM | POA: Diagnosis not present

## 2016-07-29 DIAGNOSIS — N183 Chronic kidney disease, stage 3 (moderate): Secondary | ICD-10-CM | POA: Diagnosis present

## 2016-07-29 DIAGNOSIS — K222 Esophageal obstruction: Secondary | ICD-10-CM | POA: Diagnosis present

## 2016-07-29 DIAGNOSIS — E538 Deficiency of other specified B group vitamins: Secondary | ICD-10-CM | POA: Diagnosis present

## 2016-07-29 DIAGNOSIS — Z833 Family history of diabetes mellitus: Secondary | ICD-10-CM

## 2016-07-29 DIAGNOSIS — N400 Enlarged prostate without lower urinary tract symptoms: Secondary | ICD-10-CM | POA: Diagnosis present

## 2016-07-29 DIAGNOSIS — K219 Gastro-esophageal reflux disease without esophagitis: Secondary | ICD-10-CM | POA: Diagnosis present

## 2016-07-29 DIAGNOSIS — Z8546 Personal history of malignant neoplasm of prostate: Secondary | ICD-10-CM

## 2016-07-29 DIAGNOSIS — E119 Type 2 diabetes mellitus without complications: Secondary | ICD-10-CM

## 2016-07-29 DIAGNOSIS — M1990 Unspecified osteoarthritis, unspecified site: Secondary | ICD-10-CM | POA: Diagnosis present

## 2016-07-29 DIAGNOSIS — E1140 Type 2 diabetes mellitus with diabetic neuropathy, unspecified: Secondary | ICD-10-CM

## 2016-07-29 DIAGNOSIS — C61 Malignant neoplasm of prostate: Secondary | ICD-10-CM | POA: Diagnosis present

## 2016-07-29 DIAGNOSIS — I10 Essential (primary) hypertension: Secondary | ICD-10-CM

## 2016-07-29 DIAGNOSIS — Z6827 Body mass index (BMI) 27.0-27.9, adult: Secondary | ICD-10-CM

## 2016-07-29 DIAGNOSIS — Z7982 Long term (current) use of aspirin: Secondary | ICD-10-CM

## 2016-07-29 DIAGNOSIS — E785 Hyperlipidemia, unspecified: Secondary | ICD-10-CM | POA: Diagnosis present

## 2016-07-29 DIAGNOSIS — Z87891 Personal history of nicotine dependence: Secondary | ICD-10-CM

## 2016-07-29 DIAGNOSIS — K317 Polyp of stomach and duodenum: Secondary | ICD-10-CM | POA: Diagnosis present

## 2016-07-29 DIAGNOSIS — E669 Obesity, unspecified: Secondary | ICD-10-CM | POA: Diagnosis present

## 2016-07-29 DIAGNOSIS — I25708 Atherosclerosis of coronary artery bypass graft(s), unspecified, with other forms of angina pectoris: Secondary | ICD-10-CM

## 2016-07-29 DIAGNOSIS — D649 Anemia, unspecified: Secondary | ICD-10-CM | POA: Diagnosis not present

## 2016-07-29 DIAGNOSIS — Z8249 Family history of ischemic heart disease and other diseases of the circulatory system: Secondary | ICD-10-CM

## 2016-07-29 DIAGNOSIS — K429 Umbilical hernia without obstruction or gangrene: Secondary | ICD-10-CM | POA: Diagnosis present

## 2016-07-29 DIAGNOSIS — F329 Major depressive disorder, single episode, unspecified: Secondary | ICD-10-CM | POA: Diagnosis present

## 2016-07-29 DIAGNOSIS — N189 Chronic kidney disease, unspecified: Secondary | ICD-10-CM | POA: Diagnosis present

## 2016-07-29 DIAGNOSIS — Z800 Family history of malignant neoplasm of digestive organs: Secondary | ICD-10-CM

## 2016-07-29 HISTORY — DX: Malignant neoplasm of prostate: C61

## 2016-07-29 HISTORY — DX: Atherosclerotic heart disease of native coronary artery without angina pectoris: I25.10

## 2016-07-29 LAB — COMPREHENSIVE METABOLIC PANEL
ALK PHOS: 47 U/L (ref 38–126)
ALT: 23 U/L (ref 17–63)
AST: 27 U/L (ref 15–41)
Albumin: 3.5 g/dL (ref 3.5–5.0)
Anion gap: 9 (ref 5–15)
BUN: 39 mg/dL — AB (ref 6–20)
CALCIUM: 8.6 mg/dL — AB (ref 8.9–10.3)
CO2: 21 mmol/L — AB (ref 22–32)
CREATININE: 2.06 mg/dL — AB (ref 0.61–1.24)
Chloride: 107 mmol/L (ref 101–111)
GFR calc non Af Amer: 29 mL/min — ABNORMAL LOW (ref >60)
GFR, EST AFRICAN AMERICAN: 33 mL/min — AB (ref >60)
GLUCOSE: 134 mg/dL — AB (ref 65–99)
Potassium: 4.4 mmol/L (ref 3.5–5.1)
SODIUM: 137 mmol/L (ref 135–145)
Total Bilirubin: 0.4 mg/dL (ref 0.3–1.2)
Total Protein: 6.5 g/dL (ref 6.5–8.1)

## 2016-07-29 LAB — CBC WITH DIFFERENTIAL/PLATELET
BASOS PCT: 0 %
Basophils Absolute: 0 10*3/uL (ref 0.0–0.1)
EOS ABS: 0.1 10*3/uL (ref 0.0–0.7)
Eosinophils Relative: 2 %
HCT: 18.7 % — ABNORMAL LOW (ref 39.0–52.0)
Hemoglobin: 5.7 g/dL — CL (ref 13.0–17.0)
LYMPHS PCT: 18 %
Lymphs Abs: 1 10*3/uL (ref 0.7–4.0)
MCH: 25.9 pg — AB (ref 26.0–34.0)
MCHC: 30.5 g/dL (ref 30.0–36.0)
MCV: 85 fL (ref 78.0–100.0)
Monocytes Absolute: 0.3 10*3/uL (ref 0.1–1.0)
Monocytes Relative: 6 %
Neutro Abs: 4.4 10*3/uL (ref 1.7–7.7)
Neutrophils Relative %: 74 %
Platelets: 154 10*3/uL (ref 150–400)
RBC: 2.2 MIL/uL — AB (ref 4.22–5.81)
RDW: 16.5 % — ABNORMAL HIGH (ref 11.5–15.5)
WBC: 5.8 10*3/uL (ref 4.0–10.5)

## 2016-07-29 LAB — GLUCOSE, CAPILLARY
GLUCOSE-CAPILLARY: 178 mg/dL — AB (ref 65–99)
Glucose-Capillary: 139 mg/dL — ABNORMAL HIGH (ref 65–99)

## 2016-07-29 LAB — PROTIME-INR
INR: 1.05
Prothrombin Time: 13.8 seconds (ref 11.4–15.2)

## 2016-07-29 LAB — ABO/RH: ABO/RH(D): B POS

## 2016-07-29 LAB — PREPARE RBC (CROSSMATCH)

## 2016-07-29 LAB — TROPONIN I: Troponin I: <0.03 ng/mL (ref <0.03)

## 2016-07-29 MED ORDER — INSULIN ASPART 100 UNIT/ML ~~LOC~~ SOLN
0.0000 [IU] | Freq: Three times a day (TID) | SUBCUTANEOUS | Status: DC
  Administered 2016-07-31: 1 [IU] via SUBCUTANEOUS
  Administered 2016-07-31: 2 [IU] via SUBCUTANEOUS

## 2016-07-29 MED ORDER — TAMSULOSIN HCL 0.4 MG PO CAPS
0.4000 mg | ORAL_CAPSULE | Freq: Every day | ORAL | Status: DC
  Administered 2016-07-30: 0.4 mg via ORAL
  Filled 2016-07-29: qty 1

## 2016-07-29 MED ORDER — FINASTERIDE 5 MG PO TABS
5.0000 mg | ORAL_TABLET | Freq: Every day | ORAL | Status: DC
  Administered 2016-07-30 – 2016-07-31 (×2): 5 mg via ORAL
  Filled 2016-07-29 (×4): qty 1

## 2016-07-29 MED ORDER — FAMOTIDINE IN NACL 20-0.9 MG/50ML-% IV SOLN
20.0000 mg | INTRAVENOUS | Status: AC
  Administered 2016-07-29: 20 mg via INTRAVENOUS
  Filled 2016-07-29: qty 50

## 2016-07-29 MED ORDER — ATORVASTATIN CALCIUM 40 MG PO TABS: 40.0000 mg | ORAL_TABLET | Freq: Every day | ORAL | Status: DC

## 2016-07-29 MED ORDER — SODIUM CHLORIDE 0.9 % IV SOLN
INTRAVENOUS | Status: DC
  Administered 2016-07-29: 19:00:00 via INTRAVENOUS

## 2016-07-29 MED ORDER — FAMOTIDINE IN NACL 20-0.9 MG/50ML-% IV SOLN
20.0000 mg | Freq: Two times a day (BID) | INTRAVENOUS | Status: DC
  Administered 2016-07-30: 20 mg via INTRAVENOUS
  Filled 2016-07-29 (×2): qty 50

## 2016-07-29 MED ORDER — SODIUM CHLORIDE 0.9 % IV SOLN: 10.0000 mL/h | Freq: Once | INTRAVENOUS | Status: AC

## 2016-07-29 NOTE — ED Triage Notes (Signed)
PT states generalized weakness with dizziness at times for a few weeks. PT states he had blood work at his cardiologist and they called him today and told him to come in for low hemoglobin and possibility of needed blood transfusion.

## 2016-07-29 NOTE — Patient Instructions (Signed)
Your physician recommends that you schedule a follow-up appointment in: 3 months with Dr. Bronson Ing  Your physician recommends that you continue on your current medications as directed. Please refer to the Current Medication list given to you today.  Your physician recommends that you return for lab work NOW CBC  You have been referred to [place] you for choosing [facility]!!

## 2016-07-29 NOTE — ED Provider Notes (Signed)
[facility] DEPT Provider Note   CSN: [id number] Arrival date & time: 07/29/16  1436  First Provider Contact:  None       History   Chief Complaint Chief Complaint  Patient presents with  . Anemia    HPI [facility] is a 82 y.o. male.  The pt is an 82 y/o male - he has hx of colonscopy last year which he reports was normal - he had an endcoscopy many years ago but can't remember why - he states that he has had dark black stools for a month or so but has also had some dark red blood that is in the stool - no abdominal pani but has been having increased weakness, fatigue and light headedness with standing - went to his Cardiologist office this AM and had bloodwork done showing hgb reportedly 5 and was sent to the ED.  He has hx of CABG many years ago and stents - is on ASA but no other blood thinners.  Sx are persistent, worse with standing, not assocaited with abd pain    Anemia     Past Medical History:  Diagnosis Date  . AAA (abdominal aortic aneurysm) ([place])   . Arthritis   . B12 deficiency   . Cancer ([place])   . Depression   . Diverticulitis   . DMII (diabetes mellitus, type 2) ([place]) 2008  . GERD (gastroesophageal reflux disease)   . Hiatal hernia   . HTN (hypertension)   . Hyperlipidemia     Patient Active Problem List   Diagnosis Date Noted  . Acute on chronic renal failure ([place]) 07/29/2016  . Anemia 07/29/2016  . Adenocarcinoma of prostate ([place]) 09/06/2014  . Type 2 diabetes mellitus without complications ([place]) 0000000  . CAD (coronary artery disease) of artery bypass graft 08/27/2013  . HTN (hypertension) 08/27/2013  . Hyperlipidemia 08/27/2013  . S/P CABG x 3 08/27/2013    Past Surgical History:  Procedure Laterality Date  . APPENDECTOMY    . BACK SURGERY    . CHOLECYSTECTOMY    . COLONOSCOPY    . CORONARY ARTERY BYPASS GRAFT    . DESCENDING AORTIC ANEURYSM REPAIR W/ STENT         Home Medications    Prior to Admission medications    Medication Sig Start Date End Date Taking? Authorizing Provider  aspirin 81 MG tablet Take 81 mg by mouth daily.   Yes Historical Provider, MD  Calcium Carbonate-Vitamin D (CALTRATE 600+D) 600-400 MG-UNIT per tablet Take 1 tablet by mouth daily.   Yes Historical Provider, MD  finasteride (PROSCAR) 5 MG tablet Take 5 mg by mouth daily.   Yes Historical Provider, MD  glimepiride (AMARYL) 2 MG tablet Take 2 mg by mouth daily with breakfast.    Yes Historical Provider, MD  lisinopril (PRINIVIL,ZESTRIL) 2.5 MG tablet Take 2.5 mg by mouth daily.   Yes Historical Provider, MD  metFORMIN (GLUCOPHAGE) 500 MG tablet Take 500 mg by mouth every evening.   Yes Historical Provider, MD  nitroGLYCERIN (NITROSTAT) 0.4 MG SL tablet Place 1 tablet (0.4 mg total) under the tongue every 5 (five) minutes as needed for chest pain. 04/05/16  Yes Herminio Commons, MD  omeprazole (PRILOSEC) 20 MG capsule Take 20 mg by mouth every evening.    Yes Historical Provider, MD  sertraline (ZOLOFT) 100 MG tablet Take 50 mg by mouth daily.   Yes Historical Provider, MD  simvastatin (ZOCOR) 80 MG tablet Take 80 mg by mouth at  bedtime.   Yes Historical Provider, MD  tamsulosin (FLOMAX) 0.4 MG CAPS Take 0.4 mg by mouth daily after supper.   Yes Historical Provider, MD  testosterone cypionate (DEPOTESTOTERONE CYPIONATE) 100 MG/ML injection Inject 100 mg into the muscle every 3 (three) months.   Yes Historical Provider, MD    Family History Family History  Problem Relation Age of Onset  . Heart disease    . Diabetes    . Hypertension      Social History Social History  Substance Use Topics  . Smoking status: Former Smoker    Packs/day: 3.00    Years: 45.00    Types: Cigarettes    Start date: 04/19/1951    Quit date: 03/01/1994  . Smokeless tobacco: Never Used  . Alcohol use 0.0 oz/week     Comment: 2-3 can of beer daily     Allergies   Penicillins   Review of Systems Review of Systems  All other systems reviewed  and are negative.    Physical Exam Updated Vital Signs BP 134/56   Pulse 74   Temp 98 F (36.7 C) (Oral)   Resp 22   Ht 6\' 2"  (1.88 m)   SpO2 100%   BMI 27.35 kg/m   Physical Exam  Constitutional: He appears well-developed and well-nourished. No distress.  HENT:  Head: Normocephalic and atraumatic.  Mouth/Throat: Oropharynx is clear and moist. No oropharyngeal exudate.  Eyes: EOM are normal. Pupils are equal, round, and reactive to light. Right eye exhibits no discharge. Left eye exhibits no discharge. No scleral icterus.  Pale conjunctivae  Neck: Normal range of motion. Neck supple. No JVD present. No thyromegaly present.  Cardiovascular: Normal rate, regular rhythm, normal heart sounds and intact distal pulses.  Exam reveals no gallop and no friction rub.   No murmur heard. Pulmonary/Chest: Effort normal and breath sounds normal. No respiratory distress. He has no wheezes. He has no rales.  Abdominal: Soft. Bowel sounds are normal. He exhibits no distension and no mass. There is no tenderness.  Musculoskeletal: Normal range of motion. He exhibits no edema or tenderness.  Lymphadenopathy:    He has no cervical adenopathy.  Neurological: He is alert. Coordination normal.  Skin: Skin is warm and dry. No rash noted. No erythema.  Psychiatric: He has a normal mood and affect. His behavior is normal.  Nursing note and vitals reviewed.    ED Treatments / Results  Labs (all labs ordered are listed, but only abnormal results are displayed) Labs Reviewed  CBC WITH DIFFERENTIAL/PLATELET - Abnormal; Notable for the following:       Result Value   RBC 2.20 (*)    Hemoglobin 5.7 (*)    HCT 18.7 (*)    MCH 25.9 (*)    RDW 16.5 (*)    All other components within normal limits  COMPREHENSIVE METABOLIC PANEL - Abnormal; Notable for the following:    CO2 21 (*)    Glucose, Bld 134 (*)    BUN 39 (*)    Creatinine, Ser 2.06 (*)    Calcium 8.6 (*)    GFR calc non Af Amer 29 (*)      GFR calc Af Amer 33 (*)    All other components within normal limits  TROPONIN I  PROTIME-INR  TYPE AND SCREEN  ABO/RH  PREPARE RBC (CROSSMATCH)    EKG  EKG Interpretation  Date/Time:  Monday July 29 2016 15:40:26 EDT Ventricular Rate:  77 PR Interval:    QRS  Duration: 158 QT Interval:  448 QTC Calculation: 508 R Axis:   -51 Text Interpretation:  Sinus rhythm Ventricular premature complex Borderline prolonged PR interval Left bundle branch block Baseline wander in lead(s) II III aVF No old tracing to compare Confirmed by Parminder Cupples  MD, Lariya Kinzie ([id number]) on 07/29/2016 3:43:17 PM       Radiology Dg Chest 2 View  Result Date: 07/29/2016 CLINICAL DATA:  82 year old male with weakness, dizziness, shortness of breath for 3 weeks. Anemia. Former smoker. Initial encounter. EXAM: CHEST  2 VIEW COMPARISON:  [facility] chest radiographs 06/26/2016 and earlier. CT Abdomen and Pelvis 06/26/2016 FINDINGS: Stable mediastinal contours. Mild cardiomegaly. Sequelae of CABG. Visualized tracheal air column is within normal limits. Partially visible abdominal aortic endograft. Stable lung volumes. No pneumothorax, pulmonary edema, pleural effusion or acute pulmonary opacity. Calcified thoracic aortic atherosclerosis. No acute osseous abnormality identified. Stable cholecystectomy clips. IMPRESSION: 1.  No acute cardiopulmonary abnormality. 2. Calcified aortic atherosclerosis. Electronically Signed   By: Genevie Ann M.D.   On: 07/29/2016 16:19    Procedures Procedures (including critical care time)  Medications Ordered in ED Medications  0.9 %  sodium chloride infusion (not administered)  famotidine (PEPCID) IVPB 20 mg premix (0 mg Intravenous Stopped 07/29/16 1623)     Initial Impression / Assessment and Plan / ED Course  I have reviewed the triage vital signs and the nursing notes.  Pertinent labs & imaging results that were available during my care of the patient were reviewed by me and  considered in my medical decision making (see chart for details).  Clinical Course  Comment By Time  Discussed care with Dr. Maudie Mercury who will admit for transfusion Noemi Chapel, MD 07/31 1649   Weakness and anemia - also having CP on the R when he lays down, needs repeat labs to confirm numbers though he does appear to be anemic and story fits with reported numbers.  Likely has UGI bleed as well and pepcid IV given.    Final Clinical Impressions(s) / ED Diagnoses   Final diagnoses:  Anemia, unspecified anemia type    New Prescriptions New Prescriptions   No medications on file     Noemi Chapel, MD 07/29/16 1651

## 2016-07-29 NOTE — Telephone Encounter (Signed)
Patient informed and verbalized understanding of plan. Copy sent to PCP 

## 2016-07-29 NOTE — Progress Notes (Signed)
SUBJECTIVE: Mr. [facility] has a history of DM II, HTN, PVD, and CAD, with a h/o CABG in 02/1994 by Dr. Nils Pyle with a LIMA to LAD, SVG to D1, and SVG to RCA.   Nuclear stress test on 09/14/14 demonstrated myocardial scar in the inferior wall extending from the apex to the base with no evidence of ischemia. Calculated LVEF 50%.  When I saw him in April 2017 he was doing well.  For the past 2 weeks he has been having exertional dyspnea. He gets dizzy when he is out in the sun. He was evaluated at [facility] in late June. Labs 06/26/16: Hemoglobin 8.3, platelets 108, white blood cells 3, BUN 42, creatinine 2.24.  When he lies on his right side he has chest pain and back pain alleviated with sitting up and taking nitroglycerin.  Michela Pitcher he had a colonoscopy one year ago. He does mention blood in the stool and was told this was due to hemorrhoids.   Review of Systems: As per "subjective", otherwise negative.  Allergies  Allergen Reactions  . Penicillins     Current Outpatient Prescriptions  Medication Sig Dispense Refill  . aspirin 81 MG tablet Take 81 mg by mouth daily.    . Calcium Carbonate-Vitamin D (CALTRATE 600+D) 600-400 MG-UNIT per tablet Take 1 tablet by mouth daily.    . finasteride (PROSCAR) 5 MG tablet Take 5 mg by mouth daily.    Marland Kitchen glimepiride (AMARYL) 2 MG tablet Take 2 mg by mouth daily with breakfast.     . lisinopril (PRINIVIL,ZESTRIL) 2.5 MG tablet Take 2.5 mg by mouth daily.    . metFORMIN (GLUMETZA) 500 MG (MOD) 24 hr tablet Take 500 mg by mouth daily with breakfast.    . nitroGLYCERIN (NITROSTAT) 0.4 MG SL tablet Place 1 tablet (0.4 mg total) under the tongue every 5 (five) minutes as needed for chest pain. 25 tablet 3  . omeprazole (PRILOSEC) 20 MG capsule Take 20 mg by mouth daily.    . sertraline (ZOLOFT) 100 MG tablet Take 50 mg by mouth daily.    . simvastatin (ZOCOR) 80 MG tablet Take 80 mg by mouth at bedtime.    . tamsulosin (FLOMAX) 0.4 MG CAPS Take 0.4  mg by mouth daily after supper.    . testosterone cypionate (DEPOTESTOTERONE CYPIONATE) 100 MG/ML injection Inject 100 mg into the muscle every 3 (three) months.     No current facility-administered medications for this visit.     Past Medical History:  Diagnosis Date  . AAA (abdominal aortic aneurysm) ([place])   . Arthritis   . B12 deficiency   . Cancer ([place])   . Depression   . Diverticulitis   . DMII (diabetes mellitus, type 2) ([place]) 2008  . GERD (gastroesophageal reflux disease)   . Hiatal hernia   . HTN (hypertension)   . Hyperlipidemia     No past surgical history on file.  Social History   Social History  . Marital status: Married    Spouse name: N/A  . Number of children: N/A  . Years of education: N/A   Occupational History  . Not on file.   Social History Main Topics  . Smoking status: Former Smoker    Packs/day: 3.00    Years: 45.00    Types: Cigarettes    Start date: 04/19/1951    Quit date: 03/01/1994  . Smokeless tobacco: Never Used  . Alcohol use 0.0 oz/week     Comment: 2-3 can  of beer daily  . Drug use: No  . Sexual activity: Not on file   Other Topics Concern  . Not on file   Social History Narrative  . No narrative on file     Vitals:   07/29/16 1101  BP: 118/64  Pulse: 78  Weight: 213 lb (96.6 kg)  Height: 6\' 2"  (1.88 m)    PHYSICAL EXAM General: NAD HEENT: Normal. Neck: No JVD, no thyromegaly. Lungs: Clear to auscultation bilaterally with normal respiratory effort. CV: Nondisplaced PMI.  Regular rate and rhythm, normal S1/S2, no S3/S4, no murmur. No pretibial or periankle edema.  Soft b/l carotid bruits.   Abdomen: Soft, nontender, no distention.  Neurologic: Alert and oriented.  Psych: Normal affect. Skin: Normal. Musculoskeletal: No gross deformities.   ECG: Most recent ECG reviewed.      ASSESSMENT AND PLAN: 1. Chest pain and shortness of breath in context of CAD s/p 3-vessel CABG: No ischemia by stress testing in  September 2015. He was markedly anemic, thrombocytopenic, and leukopenic in late June. I will repeat a CBC.. Continue present medication regimen with ASA and statin.  2. Essential HTN: Controlled on present therapy. No adjustments necessary.   3. Hyperlipidemia: Continue simvastatin 80 mg.  4. PVD: He follows with Dr. Sammuel Hines at [facility], most recently in 07/2015. Has mild carotid artery stenosis bilaterally and AAA. There was a small type II endovascular leak from IMA deemed stable (EVAR in 2011).   5. Pancytopenia: Will check CBC. Will make a referral to Dr. Whitney Muse (hematology).  Dispo: f/u 3 months.   Kate Sable, M.D., F.A.C.C.

## 2016-07-29 NOTE — H&P (Signed)
TRH H&P   Patient Demographics:    Andrew Zhang, is a 82 y.o. male  MRN: [id number]   DOB - Sep 24, 1934  Admit Date - 07/29/2016  Outpatient Primary MD for the patient is [name], MD  Referring MD/NP/PA:   Noemi Chapel  Outpatient Specialists:  Dr. Bronson Ing (cardiologist),   Patient coming from: home  Chief Complaint  Patient presents with  . Anemia      HPI:    Andrew Zhang  is a 82 y.o. male, w hypertension, CAD s/p CABG, AAA s/p repair, Dm2, Gerd apparently c/o  Fatigue and lightheadedness.  Pt states that has had black stool for the past 5-6 months.  Denies abd pain, diarrhea, brbpr.  Pt had Hgb checked by cardiology and returned low.  Last colonoscopy in [place], => diverticulosis.   Pt therefore presented to [facility].   In ED. Pt noted to be anemic with hgb 5.7,  Mild ARF. Bun/Creat  39/2.06  Pt will be admitted for symptomatic anemia and also ARF.     Review of systems:    In addition to the HPI above,  No Fever-chills, No Headache, No changes with Vision or hearing, No problems swallowing food or Liquids, No Chest pain, Cough or Shortness of Breath, No Abdominal pain, No Nausea or Vommitting, No Blood in stool or Urine, No dysuria, No new skin rashes or bruises, No new joints pains-aches,  No new weakness, tingling, numbness in any extremity, No recent weight gain or loss, No polyuria, polydypsia or polyphagia, No significant Mental Stressors.  A full 10 point Review of Systems was done, except as stated above, all other Review of Systems were negative.   With Past History of the following :    Past Medical History:  Diagnosis Date  . AAA (abdominal aortic aneurysm) ([place])   . Arthritis   . B12 deficiency   . CAD (coronary artery disease)   . Cancer ([place])   . Depression   . Diverticulitis   . DMII (diabetes mellitus, type 2) ([place]) 2008  . GERD  (gastroesophageal reflux disease)   . Hiatal hernia   . HTN (hypertension)   . Hyperlipidemia       Past Surgical History:  Procedure Laterality Date  . APPENDECTOMY    . BACK SURGERY    . CHOLECYSTECTOMY    . COLONOSCOPY    . CORONARY ARTERY BYPASS GRAFT    . DESCENDING AORTIC ANEURYSM REPAIR W/ STENT        Social History:     Social History  Substance Use Topics  . Smoking status: Former Smoker    Packs/day: 3.00    Years: 45.00    Types: Cigarettes    Start date: 04/19/1951    Quit date: 03/01/1994  . Smokeless tobacco: Never Used  . Alcohol use 0.0 oz/week     Comment: 2-3 can of beer daily  Lives - at home,   Mobility -   Walks without assistance.    Family History :     Family History  Problem Relation Age of Onset  . Heart disease    . Diabetes    . Hypertension    . Liver cancer Mother       Home Medications:   Prior to Admission medications   Medication Sig Start Date End Date Taking? Authorizing Provider  aspirin 81 MG tablet Take 81 mg by mouth daily.   Yes Historical Provider, MD  Calcium Carbonate-Vitamin D (CALTRATE 600+D) 600-400 MG-UNIT per tablet Take 1 tablet by mouth daily.   Yes Historical Provider, MD  finasteride (PROSCAR) 5 MG tablet Take 5 mg by mouth daily.   Yes Historical Provider, MD  glimepiride (AMARYL) 2 MG tablet Take 2 mg by mouth daily with breakfast.    Yes Historical Provider, MD  lisinopril (PRINIVIL,ZESTRIL) 2.5 MG tablet Take 2.5 mg by mouth daily.   Yes Historical Provider, MD  metFORMIN (GLUCOPHAGE) 500 MG tablet Take 500 mg by mouth every evening.   Yes Historical Provider, MD  nitroGLYCERIN (NITROSTAT) 0.4 MG SL tablet Place 1 tablet (0.4 mg total) under the tongue every 5 (five) minutes as needed for chest pain. 04/05/16  Yes Herminio Commons, MD  omeprazole (PRILOSEC) 20 MG capsule Take 20 mg by mouth every evening.    Yes Historical Provider, MD  sertraline (ZOLOFT) 100 MG tablet Take 50 mg by mouth daily.    Yes Historical Provider, MD  simvastatin (ZOCOR) 80 MG tablet Take 80 mg by mouth at bedtime.   Yes Historical Provider, MD  tamsulosin (FLOMAX) 0.4 MG CAPS Take 0.4 mg by mouth daily after supper.   Yes Historical Provider, MD  testosterone cypionate (DEPOTESTOTERONE CYPIONATE) 100 MG/ML injection Inject 100 mg into the muscle every 3 (three) months.   Yes Historical Provider, MD     Allergies:     Allergies  Allergen Reactions  . Penicillins Rash     Physical Exam:   Vitals  Blood pressure 123/79, pulse 70, temperature 98.3 F (36.8 C), temperature source Oral, resp. rate 18, height 6\' 2"  (1.88 m), weight 94.4 kg (208 lb 1.6 oz), SpO2 98 %.   1. General  lying in bed in NAD,   2. Normal affect and insight, Not Suicidal or Homicidal, Awake Alert, Oriented X 3.  3. No F.N deficits, ALL C.Nerves Intact, Strength 5/5 all 4 extremities, Sensation intact all 4 extremities, Plantars down going.  4. Ears and Eyes appear Normal, Conjunctivae pale. , PERRLA. Moist Oral Mucosa.  5. Supple Neck, No JVD, No cervical lymphadenopathy appriciated, No Carotid Bruits.  6. Symmetrical Chest wall movement, Good air movement bilaterally, CTAB.  7. Midline scar. RRR, No Gallops, Rubs or Murmurs, No Parasternal Heave.  8. Positive Bowel Sounds, Abdomen Soft, No tenderness, No organomegaly appriciated,No rebound -guarding or rigidity.  9.  No Cyanosis, Normal Skin Turgor, No Skin Rash or Bruise.  10. Good muscle tone,  joints appear normal , no effusions, Normal ROM.  11. No Palpable Lymph Nodes in Neck or Axillae     Data Review:    CBC  Recent Labs Lab 07/29/16 1523  WBC 5.8  HGB 5.7*  HCT 18.7*  PLT 154  MCV 85.0  MCH 25.9*  MCHC 30.5  RDW 16.5*  LYMPHSABS 1.0  MONOABS 0.3  EOSABS 0.1  BASOSABS 0.0   ------------------------------------------------------------------------------------------------------------------  Chemistries   Recent Labs Lab 07/29/16 1523  NA  137  K 4.4  CL 107  CO2 21*  GLUCOSE 134*  BUN 39*  CREATININE 2.06*  CALCIUM 8.6*  AST 27  ALT 23  ALKPHOS 47  BILITOT 0.4   ------------------------------------------------------------------------------------------------------------------ estimated creatinine clearance is 33.3 mL/min (by C-G formula based on SCr of 2.06 mg/dL). ------------------------------------------------------------------------------------------------------------------ No results for input(s): TSH, T4TOTAL, T3FREE, THYROIDAB in the last 72 hours.  Invalid input(s): FREET3  Coagulation profile  Recent Labs Lab 07/29/16 1523  INR 1.05   ------------------------------------------------------------------------------------------------------------------- No results for input(s): DDIMER in the last 72 hours. -------------------------------------------------------------------------------------------------------------------  Cardiac Enzymes  Recent Labs Lab 07/29/16 1523  TROPONINI <0.03   ------------------------------------------------------------------------------------------------------------------ No results found for: BNP   ---------------------------------------------------------------------------------------------------------------  Urinalysis No results found for: COLORURINE, APPEARANCEUR, LABSPEC, [place], GLUCOSEU, HGBUR, BILIRUBINUR, KETONESUR, PROTEINUR, UROBILINOGEN, NITRITE, LEUKOCYTESUR  ----------------------------------------------------------------------------------------------------------------   Imaging Results:    Dg Chest 2 View  Result Date: 07/29/2016 CLINICAL DATA:  82 year old male with weakness, dizziness, shortness of breath for 3 weeks. Anemia. Former smoker. Initial encounter. EXAM: CHEST  2 VIEW COMPARISON:  [facility] chest radiographs 06/26/2016 and earlier. CT Abdomen and Pelvis 06/26/2016 FINDINGS: Stable mediastinal contours. Mild cardiomegaly.  Sequelae of CABG. Visualized tracheal air column is within normal limits. Partially visible abdominal aortic endograft. Stable lung volumes. No pneumothorax, pulmonary edema, pleural effusion or acute pulmonary opacity. Calcified thoracic aortic atherosclerosis. No acute osseous abnormality identified. Stable cholecystectomy clips. IMPRESSION: 1.  No acute cardiopulmonary abnormality. 2. Calcified aortic atherosclerosis. Electronically Signed   By: Genevie Ann M.D.   On: 07/29/2016 16:19      Assessment & Plan:    Principal Problem:   Anemia Active Problems:   CAD (coronary artery disease) of artery bypass graft   Type 2 diabetes mellitus without complications (HCC)   Acute on chronic renal failure ([place])    1. Anemia NPO  Pepcid 20mg  iv bid GI consultation Consider check iron studies, b12, folate, tsh, spep,  Upep, LDH  2. ARF on CRF Hydrate with ns iv Hold lisinopril  3. Dm2 Stop metformin due to renal failure.  fsbs ac and qhs, iss.   4. CAD s/p CABG Cont zocor, if not gi bleeding restart aspirin  5. Prostate cancer/bph Cont flomax, cont finasteride  DVT Prophylaxis Heparin -  SCDs   AM Labs Ordered, also please review Full Orders  Family Communication: Admission, patients condition and plan of care including tests being ordered have been discussed with the patient who indicate understanding and agree with the plan and Code Status.  Code Status FULL CODE  Likely DC to    Condition GUARDED   Consults called:  GI consultation  Admission status: observation   Time spent in minutes : 28 min   Jani Gravel M.D on 07/29/2016 at 10:28 PM  Between 7am to 7pm - Pager - [phone]. After 7pm go to [email or web address] - password [facility]  [facility] - Office  [phone]

## 2016-07-29 NOTE — ED Notes (Signed)
CRITICAL VALUE ALERT  Critical value received:  hmg 5.7  Date of notification:  07/29/2016  Time of notification:  1554    Nurse who received alert: [employer]

## 2016-07-29 NOTE — Telephone Encounter (Signed)
-----   Message from Herminio Commons, MD sent at 07/29/2016  1:49 PM EDT ----- Hgb critically low. Needs to go to hospital for transfusion ASAP. Would go to [facility].

## 2016-07-30 ENCOUNTER — Encounter (HOSPITAL_COMMUNITY)
Admission: EM | Disposition: A | Discharge status: Home / Self Care | Payer: Self-pay | Source: Home / Self Care | Attending: Internal Medicine

## 2016-07-30 ENCOUNTER — Encounter (HOSPITAL_COMMUNITY): Payer: Self-pay | Admitting: Gastroenterology

## 2016-07-30 DIAGNOSIS — K921 Melena: Secondary | ICD-10-CM | POA: Diagnosis not present

## 2016-07-30 DIAGNOSIS — I25708 Atherosclerosis of coronary artery bypass graft(s), unspecified, with other forms of angina pectoris: Secondary | ICD-10-CM | POA: Diagnosis not present

## 2016-07-30 DIAGNOSIS — Z8249 Family history of ischemic heart disease and other diseases of the circulatory system: Secondary | ICD-10-CM | POA: Diagnosis not present

## 2016-07-30 DIAGNOSIS — Z833 Family history of diabetes mellitus: Secondary | ICD-10-CM | POA: Diagnosis not present

## 2016-07-30 DIAGNOSIS — K311 Adult hypertrophic pyloric stenosis: Secondary | ICD-10-CM | POA: Diagnosis not present

## 2016-07-30 DIAGNOSIS — Z79899 Other long term (current) drug therapy: Secondary | ICD-10-CM | POA: Diagnosis not present

## 2016-07-30 DIAGNOSIS — K317 Polyp of stomach and duodenum: Secondary | ICD-10-CM

## 2016-07-30 DIAGNOSIS — D649 Anemia, unspecified: Secondary | ICD-10-CM | POA: Diagnosis not present

## 2016-07-30 DIAGNOSIS — K219 Gastro-esophageal reflux disease without esophagitis: Secondary | ICD-10-CM | POA: Diagnosis present

## 2016-07-30 DIAGNOSIS — K222 Esophageal obstruction: Secondary | ICD-10-CM

## 2016-07-30 DIAGNOSIS — Z800 Family history of malignant neoplasm of digestive organs: Secondary | ICD-10-CM | POA: Diagnosis not present

## 2016-07-30 DIAGNOSIS — C61 Malignant neoplasm of prostate: Secondary | ICD-10-CM | POA: Diagnosis present

## 2016-07-30 DIAGNOSIS — N189 Chronic kidney disease, unspecified: Secondary | ICD-10-CM | POA: Diagnosis present

## 2016-07-30 DIAGNOSIS — D696 Thrombocytopenia, unspecified: Secondary | ICD-10-CM | POA: Diagnosis present

## 2016-07-30 DIAGNOSIS — E785 Hyperlipidemia, unspecified: Secondary | ICD-10-CM | POA: Diagnosis present

## 2016-07-30 DIAGNOSIS — F329 Major depressive disorder, single episode, unspecified: Secondary | ICD-10-CM | POA: Diagnosis present

## 2016-07-30 DIAGNOSIS — Z7982 Long term (current) use of aspirin: Secondary | ICD-10-CM | POA: Diagnosis not present

## 2016-07-30 DIAGNOSIS — E669 Obesity, unspecified: Secondary | ICD-10-CM | POA: Diagnosis present

## 2016-07-30 DIAGNOSIS — M1990 Unspecified osteoarthritis, unspecified site: Secondary | ICD-10-CM | POA: Diagnosis present

## 2016-07-30 DIAGNOSIS — D62 Acute posthemorrhagic anemia: Secondary | ICD-10-CM | POA: Diagnosis present

## 2016-07-30 DIAGNOSIS — N179 Acute kidney failure, unspecified: Secondary | ICD-10-CM | POA: Diagnosis not present

## 2016-07-30 DIAGNOSIS — Z8546 Personal history of malignant neoplasm of prostate: Secondary | ICD-10-CM | POA: Diagnosis not present

## 2016-07-30 DIAGNOSIS — E1122 Type 2 diabetes mellitus with diabetic chronic kidney disease: Secondary | ICD-10-CM | POA: Diagnosis present

## 2016-07-30 DIAGNOSIS — I2510 Atherosclerotic heart disease of native coronary artery without angina pectoris: Secondary | ICD-10-CM | POA: Diagnosis present

## 2016-07-30 DIAGNOSIS — K449 Diaphragmatic hernia without obstruction or gangrene: Secondary | ICD-10-CM | POA: Diagnosis present

## 2016-07-30 DIAGNOSIS — Z7984 Long term (current) use of oral hypoglycemic drugs: Secondary | ICD-10-CM | POA: Diagnosis not present

## 2016-07-30 DIAGNOSIS — N183 Chronic kidney disease, stage 3 (moderate): Secondary | ICD-10-CM | POA: Diagnosis present

## 2016-07-30 DIAGNOSIS — Z87891 Personal history of nicotine dependence: Secondary | ICD-10-CM | POA: Diagnosis not present

## 2016-07-30 HISTORY — PX: ESOPHAGOGASTRODUODENOSCOPY: SHX5428

## 2016-07-30 LAB — GLUCOSE, CAPILLARY
GLUCOSE-CAPILLARY: 130 mg/dL — AB (ref 65–99)
GLUCOSE-CAPILLARY: 146 mg/dL — AB (ref 65–99)
GLUCOSE-CAPILLARY: 120 mg/dL — AB (ref 65–99)
Glucose-Capillary: 98 mg/dL (ref 65–99)
Glucose-Capillary: 240 mg/dL — ABNORMAL HIGH (ref 65–99)
Glucose-Capillary: 79 mg/dL (ref 65–99)

## 2016-07-30 LAB — COMPREHENSIVE METABOLIC PANEL
ALBUMIN: 2.9 g/dL — AB (ref 3.5–5.0)
ALT: 19 U/L (ref 17–63)
AST: 19 U/L (ref 15–41)
Alkaline Phosphatase: 41 U/L (ref 38–126)
Anion gap: 6 (ref 5–15)
BUN: 40 mg/dL — AB (ref 6–20)
CHLORIDE: 111 mmol/L (ref 101–111)
CO2: 24 mmol/L (ref 22–32)
CREATININE: 1.95 mg/dL — AB (ref 0.61–1.24)
Calcium: 8.4 mg/dL — ABNORMAL LOW (ref 8.9–10.3)
GFR calc Af Amer: 36 mL/min — ABNORMAL LOW (ref >60)
GFR, EST NON AFRICAN AMERICAN: 31 mL/min — AB (ref >60)
GLUCOSE: 109 mg/dL — AB (ref 65–99)
POTASSIUM: 4.3 mmol/L (ref 3.5–5.1)
SODIUM: 141 mmol/L (ref 135–145)
Total Bilirubin: 0.7 mg/dL (ref 0.3–1.2)
Total Protein: 5.5 g/dL — ABNORMAL LOW (ref 6.5–8.1)

## 2016-07-30 LAB — HEMOGLOBIN A1C
HEMOGLOBIN A1C: 6.1 % — AB (ref 4.8–5.6)
MEAN PLASMA GLUCOSE: 128 mg/dL

## 2016-07-30 LAB — CBC WITH DIFFERENTIAL/PLATELET
Basophils Absolute: 0 10*3/uL (ref 0.0–0.1)
Basophils Relative: 1 %
EOS PCT: 4 %
Eosinophils Absolute: 0.2 10*3/uL (ref 0.0–0.7)
HCT: 22 % — ABNORMAL LOW (ref 39.0–52.0)
HEMOGLOBIN: 6.7 g/dL — AB (ref 13.0–17.0)
LYMPHS ABS: 1.1 10*3/uL (ref 0.7–4.0)
LYMPHS PCT: 25 %
MCH: 26.3 pg (ref 26.0–34.0)
MCHC: 30.9 g/dL (ref 30.0–36.0)
MCV: 84.9 fL (ref 78.0–100.0)
MONOS PCT: 11 %
Monocytes Absolute: 0.5 10*3/uL (ref 0.1–1.0)
Neutro Abs: 2.5 10*3/uL (ref 1.7–7.7)
Neutrophils Relative %: 60 %
PLATELETS: 121 10*3/uL — AB (ref 150–400)
RBC: 2.59 MIL/uL — AB (ref 4.22–5.81)
RDW: 15.7 % — ABNORMAL HIGH (ref 11.5–15.5)
WBC: 4.3 10*3/uL (ref 4.0–10.5)

## 2016-07-30 LAB — PREPARE RBC (CROSSMATCH)

## 2016-07-30 SURGERY — EGD (ESOPHAGOGASTRODUODENOSCOPY)
Anesthesia: Moderate Sedation

## 2016-07-30 MED ORDER — FENTANYL CITRATE (PF) 100 MCG/2ML IJ SOLN
INTRAMUSCULAR | Status: AC
  Filled 2016-07-30: qty 2

## 2016-07-30 MED ORDER — SODIUM CHLORIDE 0.9 % IV SOLN: INTRAVENOUS | Status: DC

## 2016-07-30 MED ORDER — MEPERIDINE HCL 100 MG/ML IJ SOLN
INTRAMUSCULAR | Status: AC
  Filled 2016-07-30: qty 2

## 2016-07-30 MED ORDER — LIDOCAINE VISCOUS 2 % MT SOLN
OROMUCOSAL | Status: DC | PRN
  Administered 2016-07-30: 4 mL via OROMUCOSAL

## 2016-07-30 MED ORDER — LIDOCAINE VISCOUS 2 % MT SOLN
OROMUCOSAL | Status: AC
  Filled 2016-07-30: qty 15

## 2016-07-30 MED ORDER — MIDAZOLAM HCL 5 MG/5ML IJ SOLN
INTRAMUSCULAR | Status: AC
  Filled 2016-07-30: qty 10

## 2016-07-30 MED ORDER — SODIUM CHLORIDE 0.9 % IV SOLN
Freq: Once | INTRAVENOUS | Status: AC
Start: 2016-07-30 — End: 2016-07-30
  Administered 2016-07-30: 11:00:00 via INTRAVENOUS

## 2016-07-30 MED ORDER — METOCLOPRAMIDE HCL 5 MG/ML IJ SOLN
10.0000 mg | INTRAMUSCULAR | Status: AC
  Administered 2016-07-30: 10 mg via INTRAVENOUS
  Filled 2016-07-30: qty 2

## 2016-07-30 MED ORDER — MIDAZOLAM HCL 5 MG/5ML IJ SOLN
INTRAMUSCULAR | Status: DC | PRN
  Administered 2016-07-30: 2 mg via INTRAVENOUS
  Administered 2016-07-30 (×2): 1 mg via INTRAVENOUS

## 2016-07-30 MED ORDER — FENTANYL CITRATE (PF) 100 MCG/2ML IJ SOLN
INTRAMUSCULAR | Status: DC | PRN
  Administered 2016-07-30: 50 ug via INTRAVENOUS

## 2016-07-30 MED ORDER — FAMOTIDINE IN NACL 20-0.9 MG/50ML-% IV SOLN: 20.0000 mg | INTRAVENOUS | Status: DC

## 2016-07-30 MED ORDER — FAMOTIDINE IN NACL 20-0.9 MG/50ML-% IV SOLN
INTRAVENOUS | Status: AC
  Filled 2016-07-30: qty 50

## 2016-07-30 MED ORDER — SODIUM CHLORIDE 0.9 % IV SOLN: Freq: Once | INTRAVENOUS | Status: AC

## 2016-07-30 MED ORDER — PANTOPRAZOLE SODIUM 40 MG PO TBEC
40.0000 mg | DELAYED_RELEASE_TABLET | Freq: Two times a day (BID) | ORAL | Status: DC
  Administered 2016-07-30 – 2016-07-31 (×3): 40 mg via ORAL
  Filled 2016-07-30 (×3): qty 1

## 2016-07-30 MED ORDER — MEPERIDINE HCL 100 MG/ML IJ SOLN
INTRAMUSCULAR | Status: DC | PRN
Start: 2016-07-30 — End: 2016-07-30

## 2016-07-30 NOTE — Progress Notes (Signed)
Patient has a hemoglobin of 6.7, MD informed and orders given

## 2016-07-30 NOTE — Care Management Obs Status (Signed)
[place] NOTIFICATION   Patient Details  Name: MIKIYAS MENTER MRN: [id number] Date of Birth: 1934-02-09   Medicare Observation Status Notification Given:  Yes    Mikias Lanz, Chauncey Reading, RN 07/30/2016, 12:28 PM

## 2016-07-30 NOTE — OR Nursing (Signed)
Givens capsule dropped @ 1502, patient can have clear liquids@ 2000, and regular diet @ 2300. Given to be removed at 2300 per Nursing supervisor

## 2016-07-30 NOTE — Interval H&P Note (Signed)
History and Physical Interval Note:  07/30/2016 2:19 PM  Andrew Zhang  has presented today for surgery, with the diagnosis of acute blood loss anemia, melena  The various methods of treatment have been discussed with the patient and family. After consideration of risks, benefits and other options for treatment, the patient has consented to  Procedure(s): ESOPHAGOGASTRODUODENOSCOPY (EGD) (N/A) as a surgical intervention .  The patient's history has been reviewed, patient examined, no change in status, stable for surgery.  I have reviewed the patient's chart and labs.  Questions were answered to the patient's satisfaction.     [employer]

## 2016-07-30 NOTE — Progress Notes (Signed)
PROGRESS NOTE    Andrew Zhang  [id number] DOB: 31-Jul-1934 DOA: 07/29/2016 PCP: [name], MD Outpatient Specialists:  Cardiology; Dr. Bronson Ing   Brief Narrative:  82 yom with a hx of AAA and GERD presented with complaints of fatigue and lightheadedness, as well as black stool for 5-6 months. While being evaluated in the ED, patient was noted to be anemic with hgb 5.7. His creatinine was also noted to be elevated, although no prior baseline is available. He was admitted for further management. He is being transfused prbc and evaluated by GI for endoscopy.   Assessment & Plan:   Principal Problem:   Anemia Active Problems:   CAD (coronary artery disease) of artery bypass graft   Type 2 diabetes mellitus without complications (HCC)   Acute on chronic renal failure (HCC)  1. Symptomatic anemia. Patient has been transfused 2 units pRBCs with insufficient improvement in hgb. An additional unit of PRBC has been ordered. Will follow up post transfusion cbc. .  2. GI bleeding. Patient reports melanotic stools prior to admission. He was reportedly taking Aleve prior to admission. GI consulted and plans on EGD today. Continue PPI 3. Suspect CKD, although no prior records. Possibly exacerbated by anemia. Will hold lisinopril. Continue IVFs. Recheck in AM 4. DM type 2. Hold metformin. Follow up hgb a1c. Continue SSI. 5. CAD s/p CABG. Aspirin is being held in the setting of possible GI bleed. No complaints of chest pain 6. Prostate cancer/ BPH. Continue current management.   DVT prophylaxis: SCDs Code Status: full Family Communication: discussed with patient and family at the bedside Disposition Plan: Discharge home once improved   Consultants:   Gastroenterology  Procedures:   3 units pRBC transfusion 8/1  Antimicrobials:   none    Subjective: No abdominal pain or vomiting. Overall feeling better  Objective: Vitals:   07/29/16 2112 07/29/16 2146 07/30/16 0007  07/30/16 0500  BP: (!) 138/55 123/79 (!) 133/50 132/64  Pulse: 70 70 67 72  Resp: 18 18 18 18   Temp: 98.6 F (37 C) 98.3 F (36.8 C) 98 F (36.7 C) 98.2 F (36.8 C)  TempSrc: Oral Oral Oral Oral  SpO2: 99% 98% 97% 98%  Weight:    94.3 kg (207 lb 14.4 oz)  Height:        Intake/Output Summary (Last 24 hours) at 07/30/16 0832 Last data filed at 07/30/16 0400  Gross per 24 hour  Intake             1350 ml  Output              100 ml  Net             1250 ml   Filed Weights   07/29/16 1738 07/30/16 0500  Weight: 94.4 kg (208 lb 1.6 oz) 94.3 kg (207 lb 14.4 oz)    Examination:  General exam: Appears calm and comfortable  Respiratory system: Clear to auscultation. Respiratory effort normal. Cardiovascular system: S1 & S2 heard, RRR. No JVD, murmurs, rubs, gallops or clicks. No pedal edema. Gastrointestinal system: Abdomen is nondistended, soft and nontender. No organomegaly or masses felt. Normal bowel sounds heard. Central nervous system: Alert and oriented. No focal neurological deficits. Extremities: Symmetric 5 x 5 power. Skin: No rashes, lesions or ulcers Psychiatry: Judgement and insight appear normal. Mood & affect appropriate.     Data Reviewed: I have personally reviewed following labs and imaging studies  CBC:  Recent Labs Lab 07/29/16 1523 07/30/16 0448  WBC 5.8 4.3  NEUTROABS 4.4 2.5  HGB 5.7* 6.7*  HCT 18.7* 22.0*  MCV 85.0 84.9  PLT 154 [id number]*   Basic Metabolic Panel:  Recent Labs Lab 07/29/16 1523 07/30/16 0448  NA 137 141  K 4.4 4.3  CL 107 111  CO2 21* 24  GLUCOSE 134* 109*  BUN 39* 40*  CREATININE 2.06* 1.95*  CALCIUM 8.6* 8.4*   GFR: Estimated Creatinine Clearance: 35.1 mL/min (by C-G formula based on SCr of 1.95 mg/dL). Liver Function Tests:  Recent Labs Lab 07/29/16 1523 07/30/16 0448  AST 27 19  ALT 23 19  ALKPHOS 47 41  BILITOT 0.4 0.7  PROT 6.5 5.5*  ALBUMIN 3.5 2.9*   Coagulation Profile:  Recent Labs Lab  07/29/16 1523  INR 1.05   Cardiac Enzymes:  Recent Labs Lab 07/29/16 1523  TROPONINI <0.03   CBG:  Recent Labs Lab 07/29/16 1945 07/30/16 0000 07/30/16 0405 07/30/16 0721  GLUCAP 139* 178* 130* 98    Radiology Studies: Dg Chest 2 View  Result Date: 07/29/2016 CLINICAL DATA:  82 year old male with weakness, dizziness, shortness of breath for 3 weeks. Anemia. Former smoker., dizziness, shortness of breath for 3 weeks. Anemia. Former smoker. Initial encounter. EXAM: CHEST  2 VIEW COMPARISON:  [facility] chest radiographs 06/26/2016 and earlier. CT Abdomen and Pelvis 06/26/2016 FINDINGS: Stable mediastinal contours. Mild cardiomegaly. Sequelae of CABG. Visualized tracheal air column is within normal limits. Partially visible abdominal aortic endograft. Stable lung volumes. No pneumothorax, pulmonary edema, pleural effusion or acute pulmonary opacity. Calcified thoracic aortic atherosclerosis. No acute osseous abnormality identified. Stable cholecystectomy clips. IMPRESSION: 1.  No acute cardiopulmonary abnormality. 2. Calcified aortic atherosclerosis. Electronically Signed   By: Genevie Ann M.D.   On: 07/29/2016 16:19    Scheduled Meds: . sodium chloride  10 mL/hr Intravenous Once  . atorvastatin  40 mg Oral q1800  . famotidine (PEPCID) IV  20 mg Intravenous Q24H  . finasteride  5 mg Oral Daily  . insulin aspart  0-9 Units Subcutaneous TID WC  . tamsulosin  0.4 mg Oral QPC supper   Continuous Infusions:    LOS: 0 days    Time spent: 25 minutes  Kathie Dike, MD [facility] Pager [phone]  If 7PM-7AM, please contact night-coverage [email or web address] Password [facility] 07/30/2016, 8:32 AM

## 2016-07-30 NOTE — Consult Note (Signed)
Referring Provider: Dr. Maudie Mercury  Primary Care Physician:  [name], MD Primary Gastroenterologist:  Dr. Britta Mccreedy (Dr. Oneida Alar while inpatient)   Date of Admission: 07/29/16 Date of Consultation: 07/30/16  Reason for Consultation:  Acute blood loss anemia, melena   HPI:  Andrew Zhang is an 82 y.o. year old male admitted with acute blood loss anemia, melena. Seen by cardiology yesterday as an outpatient and sent for labs, found to have profound anemia and sent to the ED. Presenting Hgb 5.7. Per office note by cardiology, outside labs from [facility] in June with Hgb 8.3, platelets 108, Cr 2.24.   Started having progressive weakness, dizziness, chest discomfort, hurting in his chest and his back. Saw Dr. Nicanor Alcon yesterday and had repeat blood work and was sent to the ED. Had 2 units PRBCs yesterday and an additional 1 unit PRBCs ordered today. States he has been seeing black, tarry stool for a several months.  Sometimes would feel like he would have to urinate and would have a chunk of jelly-like old blood from his rectum. States about 2 months ago he was told he was heme positive at his PCP's office. No abdominal pain. No loss of appetite. No N/V. Started taking Aleve about 4-5 months ago for "hurting all over", sometimes twice a day. Also takes an 81 mg aspirin daily. He states he saw Dr. Britta Mccreedy "the other day". Drinks 1-2 beers a day.   Believes he had a remote upper endoscopy. Last colonoscopy by Dr. Britta Mccreedy in [place] about a year ago. He does not believe he had any polyps.   Past Medical History:  Diagnosis Date  . AAA (abdominal aortic aneurysm) ([place])   . Arthritis   . B12 deficiency   . CAD (coronary artery disease)   . Cancer ([place])   . Depression   . Diverticulitis   . DMII (diabetes mellitus, type 2) ([place]) 2008  . GERD (gastroesophageal reflux disease)   . Hiatal hernia   . HTN (hypertension)   . Hyperlipidemia   . Prostate cancer [facility])     Past Surgical History:  Procedure  Laterality Date  . APPENDECTOMY    . BACK SURGERY    . CHOLECYSTECTOMY    . COLONOSCOPY     about a year ago (2015/2016 per patient)   . CORONARY ARTERY BYPASS GRAFT    . DESCENDING AORTIC ANEURYSM REPAIR W/ STENT      Prior to Admission medications   Medication Sig Start Date End Date Taking? Authorizing Provider  aspirin 81 MG tablet Take 81 mg by mouth daily.   Yes Historical Provider, MD  Calcium Carbonate-Vitamin D (CALTRATE 600+D) 600-400 MG-UNIT per tablet Take 1 tablet by mouth daily.   Yes Historical Provider, MD  finasteride (PROSCAR) 5 MG tablet Take 5 mg by mouth daily.   Yes Historical Provider, MD  glimepiride (AMARYL) 2 MG tablet Take 2 mg by mouth daily with breakfast.    Yes Historical Provider, MD  lisinopril (PRINIVIL,ZESTRIL) 2.5 MG tablet Take 2.5 mg by mouth daily.   Yes Historical Provider, MD  metFORMIN (GLUCOPHAGE) 500 MG tablet Take 500 mg by mouth every evening.   Yes Historical Provider, MD  nitroGLYCERIN (NITROSTAT) 0.4 MG SL tablet Place 1 tablet (0.4 mg total) under the tongue every 5 (five) minutes as needed for chest pain. 04/05/16  Yes Herminio Commons, MD  omeprazole (PRILOSEC) 20 MG capsule Take 20 mg by mouth every evening.    Yes Historical Provider, MD  sertraline (ZOLOFT) 100 MG tablet Take 50 mg by mouth daily.   Yes Historical Provider, MD  simvastatin (ZOCOR) 80 MG tablet Take 80 mg by mouth at bedtime.   Yes Historical Provider, MD  tamsulosin (FLOMAX) 0.4 MG CAPS Take 0.4 mg by mouth daily after supper.   Yes Historical Provider, MD  testosterone cypionate (DEPOTESTOTERONE CYPIONATE) 100 MG/ML injection Inject 100 mg into the muscle every 3 (three) months.   Yes Historical Provider, MD    Current Facility-Administered Medications  Medication Dose Route Frequency Provider Last Rate Last Dose  . 0.9 %  sodium chloride infusion  10 mL/hr Intravenous Once Noemi Chapel, MD      . atorvastatin (LIPITOR) tablet 40 mg  40 mg Oral q1800 Jani Gravel, MD       . finasteride (PROSCAR) tablet 5 mg  5 mg Oral Daily Jani Gravel, MD   5 mg at 07/30/16 [id number]  . insulin aspart (novoLOG) injection 0-9 Units  0-9 Units Subcutaneous TID WC Jani Gravel, MD      . pantoprazole (PROTONIX) EC tablet 40 mg  40 mg Oral BID AC Danie Binder, MD   40 mg at 07/30/16 0932  . tamsulosin (FLOMAX) capsule 0.4 mg  0.4 mg Oral QPC supper Jani Gravel, MD        Allergies as of 07/29/2016 - Review Complete 07/29/2016  Allergen Reaction Noted  . Penicillins Rash 08/27/2013    Family History  Problem Relation Age of Onset  . Heart disease    . Diabetes    . Hypertension    . Liver cancer Mother   . Colon cancer Neg Hx     Social History   Social History  . Marital status: Married    Spouse name: N/A  . Number of children: N/A  . Years of education: N/A   Occupational History  . Not on file.   Social History Main Topics  . Smoking status: Former Smoker    Packs/day: 3.00    Years: 45.00    Types: Cigarettes    Start date: 04/19/1951    Quit date: 03/01/1994  . Smokeless tobacco: Never Used  . Alcohol use 0.0 oz/week     Comment: 1-2 beers daily   . Drug use: No  . Sexual activity: Not on file   Other Topics Concern  . Not on file   Social History Narrative  . No narrative on file    Review of Systems: As mentioned in HPI   Physical Exam: Vital signs in last 24 hours: Temp:  [97.8 F (36.6 C)-99.2 F (37.3 C)] 98.2 F (36.8 C) (08/01 0500) Pulse Rate:  [67-92] 72 (08/01 0500) Resp:  [14-22] 18 (08/01 0500) BP: (115-170)/(50-79) 132/64 (08/01 0500) SpO2:  [97 %-100 %] 98 % (08/01 0500) Weight:  [207 lb 14.4 oz (94.3 kg)-213 lb (96.6 kg)] 207 lb 14.4 oz (94.3 kg) (08/01 0500) Last BM Date: 07/29/16 General:   Alert,  Well-developed, well-nourished, pleasant and cooperative in NAD, pale.  Head:  Normocephalic and atraumatic. Eyes:  Sclera clear, no icterus.   Conjunctiva pink. Ears:  Normal auditory acuity. Nose:  No deformity, discharge,   or lesions. Mouth:  No deformity or lesions, dentition normal. Lungs:  Clear throughout to auscultation.    Heart:  Regular rate and rhythm Abdomen:  Soft, nontender, obese. No rebound or guarding. Small umbilical hernia.  Rectal:  Deferred  Msk:  Symmetrical without gross deformities. Normal posture. Extremities:  Without  edema. Neurologic:  Alert and  oriented x4 Psych:  Alert and cooperative. Normal mood and affect.  Intake/Output from previous day: 07/31 0701 - 08/01 0700 In: 1350 [I.V.:300; Blood:1050] Out: 100 [Urine:100] Intake/Output this shift: Total I/O In: 240 [P.O.:240] Out: 200 [Urine:200]  Lab Results:  Recent Labs  07/29/16 1523 07/30/16 0448  WBC 5.8 4.3  HGB 5.7* 6.7*  HCT 18.7* 22.0*  PLT 154 121*   BMET  Recent Labs  07/29/16 1523 07/30/16 0448  NA 137 141  K 4.4 4.3  CL 107 111  CO2 21* 24  GLUCOSE 134* 109*  BUN 39* 40*  CREATININE 2.06* 1.95*  CALCIUM 8.6* 8.4*   LFT  Recent Labs  07/29/16 1523 07/30/16 0448  PROT 6.5 5.5*  ALBUMIN 3.5 2.9*  AST 27 19  ALT 23 19  ALKPHOS 47 41  BILITOT 0.4 0.7   PT/INR  Recent Labs  07/29/16 1523  LABPROT 13.8  INR 1.05   Studies/Results: Dg Chest 2 View  Result Date: 07/29/2016 CLINICAL DATA:  82 year old male with weakness, dizziness, shortness of breath for 3 weeks. Anemia. Former smoker. Initial encounter. EXAM: CHEST  2 VIEW COMPARISON:  [facility] chest radiographs 06/26/2016 and earlier. CT Abdomen and Pelvis 06/26/2016 FINDINGS: Stable mediastinal contours. Mild cardiomegaly. Sequelae of CABG. Visualized tracheal air column is within normal limits. Partially visible abdominal aortic endograft. Stable lung volumes. No pneumothorax, pulmonary edema, pleural effusion or acute pulmonary opacity. Calcified thoracic aortic atherosclerosis. No acute osseous abnormality identified. Stable cholecystectomy clips. IMPRESSION: 1.  No acute cardiopulmonary abnormality. 2.  Calcified aortic atherosclerosis. Electronically Signed   By: Genevie Ann M.D.   On: 07/29/2016 16:19    Impression: 82 year old male presenting with profound anemia, melena, and history notable for taking Aleve up to twice a day for at least the past 4-5 months. 2 units PRBCs received and an additional unit ordered to be given. Although he has eaten breakfast earlier this morning, he is now NPO and understands he is not to eat anything else. He is agreeable to a diagnostic EGD later today with Dr. Oneida Alar. Risks and benefits discussed in detail today with stated understanding by patient.   Plan: NPO  Protonix BID EGD with Dr. Oneida Alar later this afternoon Further recommendations to follow   Orvil Feil, ANP-BC [facility]     LOS: 0 days    07/30/2016, 10:43 AM

## 2016-07-30 NOTE — H&P (View-Only) (Signed)
Referring Provider: Dr. Maudie Mercury  Primary Care Physician:  [name], MD Primary Gastroenterologist:  Dr. Britta Mccreedy (Dr. Oneida Alar while inpatient)   Date of Admission: 07/29/16 Date of Consultation: 07/30/16  Reason for Consultation:  Acute blood loss anemia, melena   HPI:  Andrew Zhang is an 82 y.o. year old male admitted with acute blood loss anemia, melena. Seen by cardiology yesterday as an outpatient and sent for labs, found to have profound anemia and sent to the ED. Presenting Hgb 5.7. Per office note by cardiology, outside labs from [facility] in June with Hgb 8.3, platelets 108, Cr 2.24.   Started having progressive weakness, dizziness, chest discomfort, hurting in his chest and his back. Saw Dr. Nicanor Alcon yesterday and had repeat blood work and was sent to the ED. Had 2 units PRBCs yesterday and an additional 1 unit PRBCs ordered today. States he has been seeing black, tarry stool for a several months.  Sometimes would feel like he would have to urinate and would have a chunk of jelly-like old blood from his rectum. States about 2 months ago he was told he was heme positive at his PCP's office. No abdominal pain. No loss of appetite. No N/V. Started taking Aleve about 4-5 months ago for "hurting all over", sometimes twice a day. Also takes an 81 mg aspirin daily. He states he saw Dr. Britta Mccreedy "the other day". Drinks 1-2 beers a day.   Believes he had a remote upper endoscopy. Last colonoscopy by Dr. Britta Mccreedy in [place] about a year ago. He does not believe he had any polyps.   Past Medical History:  Diagnosis Date  . AAA (abdominal aortic aneurysm) ([place])   . Arthritis   . B12 deficiency   . CAD (coronary artery disease)   . Cancer ([place])   . Depression   . Diverticulitis   . DMII (diabetes mellitus, type 2) ([place]) 2008  . GERD (gastroesophageal reflux disease)   . Hiatal hernia   . HTN (hypertension)   . Hyperlipidemia   . Prostate cancer [facility])     Past Surgical History:  Procedure  Laterality Date  . APPENDECTOMY    . BACK SURGERY    . CHOLECYSTECTOMY    . COLONOSCOPY     about a year ago (2015/2016 per patient)   . CORONARY ARTERY BYPASS GRAFT    . DESCENDING AORTIC ANEURYSM REPAIR W/ STENT      Prior to Admission medications   Medication Sig Start Date End Date Taking? Authorizing Provider  aspirin 81 MG tablet Take 81 mg by mouth daily.   Yes Historical Provider, MD  Calcium Carbonate-Vitamin D (CALTRATE 600+D) 600-400 MG-UNIT per tablet Take 1 tablet by mouth daily.   Yes Historical Provider, MD  finasteride (PROSCAR) 5 MG tablet Take 5 mg by mouth daily.   Yes Historical Provider, MD  glimepiride (AMARYL) 2 MG tablet Take 2 mg by mouth daily with breakfast.    Yes Historical Provider, MD  lisinopril (PRINIVIL,ZESTRIL) 2.5 MG tablet Take 2.5 mg by mouth daily.   Yes Historical Provider, MD  metFORMIN (GLUCOPHAGE) 500 MG tablet Take 500 mg by mouth every evening.   Yes Historical Provider, MD  nitroGLYCERIN (NITROSTAT) 0.4 MG SL tablet Place 1 tablet (0.4 mg total) under the tongue every 5 (five) minutes as needed for chest pain. 04/05/16  Yes Herminio Commons, MD  omeprazole (PRILOSEC) 20 MG capsule Take 20 mg by mouth every evening.    Yes Historical Provider, MD  sertraline (ZOLOFT) 100 MG tablet Take 50 mg by mouth daily.   Yes Historical Provider, MD  simvastatin (ZOCOR) 80 MG tablet Take 80 mg by mouth at bedtime.   Yes Historical Provider, MD  tamsulosin (FLOMAX) 0.4 MG CAPS Take 0.4 mg by mouth daily after supper.   Yes Historical Provider, MD  testosterone cypionate (DEPOTESTOTERONE CYPIONATE) 100 MG/ML injection Inject 100 mg into the muscle every 3 (three) months.   Yes Historical Provider, MD    Current Facility-Administered Medications  Medication Dose Route Frequency Provider Last Rate Last Dose  . 0.9 %  sodium chloride infusion  10 mL/hr Intravenous Once Noemi Chapel, MD      . atorvastatin (LIPITOR) tablet 40 mg  40 mg Oral q1800 Jani Gravel, MD       . finasteride (PROSCAR) tablet 5 mg  5 mg Oral Daily Jani Gravel, MD   5 mg at 07/30/16 [id number]  . insulin aspart (novoLOG) injection 0-9 Units  0-9 Units Subcutaneous TID WC Jani Gravel, MD      . pantoprazole (PROTONIX) EC tablet 40 mg  40 mg Oral BID AC Danie Binder, MD   40 mg at 07/30/16 0932  . tamsulosin (FLOMAX) capsule 0.4 mg  0.4 mg Oral QPC supper Jani Gravel, MD        Allergies as of 07/29/2016 - Review Complete 07/29/2016  Allergen Reaction Noted  . Penicillins Rash 08/27/2013    Family History  Problem Relation Age of Onset  . Heart disease    . Diabetes    . Hypertension    . Liver cancer Mother   . Colon cancer Neg Hx     Social History   Social History  . Marital status: Married    Spouse name: N/A  . Number of children: N/A  . Years of education: N/A   Occupational History  . Not on file.   Social History Main Topics  . Smoking status: Former Smoker    Packs/day: 3.00    Years: 45.00    Types: Cigarettes    Start date: 04/19/1951    Quit date: 03/01/1994  . Smokeless tobacco: Never Used  . Alcohol use 0.0 oz/week     Comment: 1-2 beers daily   . Drug use: No  . Sexual activity: Not on file   Other Topics Concern  . Not on file   Social History Narrative  . No narrative on file    Review of Systems: As mentioned in HPI   Physical Exam: Vital signs in last 24 hours: Temp:  [97.8 F (36.6 C)-99.2 F (37.3 C)] 98.2 F (36.8 C) (08/01 0500) Pulse Rate:  [67-92] 72 (08/01 0500) Resp:  [14-22] 18 (08/01 0500) BP: (115-170)/(50-79) 132/64 (08/01 0500) SpO2:  [97 %-100 %] 98 % (08/01 0500) Weight:  [207 lb 14.4 oz (94.3 kg)-213 lb (96.6 kg)] 207 lb 14.4 oz (94.3 kg) (08/01 0500) Last BM Date: 07/29/16 General:   Alert,  Well-developed, well-nourished, pleasant and cooperative in NAD, pale.  Head:  Normocephalic and atraumatic. Eyes:  Sclera clear, no icterus.   Conjunctiva pink. Ears:  Normal auditory acuity. Nose:  No deformity, discharge,   or lesions. Mouth:  No deformity or lesions, dentition normal. Lungs:  Clear throughout to auscultation.    Heart:  Regular rate and rhythm Abdomen:  Soft, nontender, obese. No rebound or guarding. Small umbilical hernia.  Rectal:  Deferred  Msk:  Symmetrical without gross deformities. Normal posture. Extremities:  Without  edema. Neurologic:  Alert and  oriented x4 Psych:  Alert and cooperative. Normal mood and affect.  Intake/Output from previous day: 07/31 0701 - 08/01 0700 In: 1350 [I.V.:300; Blood:1050] Out: 100 [Urine:100] Intake/Output this shift: Total I/O In: 240 [P.O.:240] Out: 200 [Urine:200]  Lab Results:  Recent Labs  07/29/16 1523 07/30/16 0448  WBC 5.8 4.3  HGB 5.7* 6.7*  HCT 18.7* 22.0*  PLT 154 121*   BMET  Recent Labs  07/29/16 1523 07/30/16 0448  NA 137 141  K 4.4 4.3  CL 107 111  CO2 21* 24  GLUCOSE 134* 109*  BUN 39* 40*  CREATININE 2.06* 1.95*  CALCIUM 8.6* 8.4*   LFT  Recent Labs  07/29/16 1523 07/30/16 0448  PROT 6.5 5.5*  ALBUMIN 3.5 2.9*  AST 27 19  ALT 23 19  ALKPHOS 47 41  BILITOT 0.4 0.7   PT/INR  Recent Labs  07/29/16 1523  LABPROT 13.8  INR 1.05   Studies/Results: Dg Chest 2 View  Result Date: 07/29/2016 CLINICAL DATA:  82 year old male with weakness, dizziness, shortness of breath for 3 weeks. Anemia. Former smoker. Initial encounter. EXAM: CHEST  2 VIEW COMPARISON:  [facility] chest radiographs 06/26/2016 and earlier. CT Abdomen and Pelvis 06/26/2016 FINDINGS: Stable mediastinal contours. Mild cardiomegaly. Sequelae of CABG. Visualized tracheal air column is within normal limits. Partially visible abdominal aortic endograft. Stable lung volumes. No pneumothorax, pulmonary edema, pleural effusion or acute pulmonary opacity. Calcified thoracic aortic atherosclerosis. No acute osseous abnormality identified. Stable cholecystectomy clips. IMPRESSION: 1.  No acute cardiopulmonary abnormality. 2.  Calcified aortic atherosclerosis. Electronically Signed   By: Genevie Ann M.D.   On: 07/29/2016 16:19    Impression: 82 year old male presenting with profound anemia, melena, and history notable for taking Aleve up to twice a day for at least the past 4-5 months. 2 units PRBCs received and an additional unit ordered to be given. Although he has eaten breakfast earlier this morning, he is now NPO and understands he is not to eat anything else. He is agreeable to a diagnostic EGD later today with Dr. Oneida Alar. Risks and benefits discussed in detail today with stated understanding by patient.   Plan: NPO  Protonix BID EGD with Dr. Oneida Alar later this afternoon Further recommendations to follow   Orvil Feil, ANP-BC [facility]astroenter[facility]     LOS: 0 days    07/30/2016, 10:43 AM

## 2016-07-30 NOTE — Interval H&P Note (Signed)
History and Physical Interval Note:  07/30/2016 2:41 PM  Andrew Zhang  has presented today for surgery, with the diagnosis of acute blood loss anemia, melena  The various methods of treatment have been discussed with the patient and family. After consideration of risks, benefits and other options for treatment, the patient has consented to  Procedure(s): ESOPHAGOGASTRODUODENOSCOPY (EGD) (N/A) with givens capsule placementas a surgical intervention .  The patient's history has been reviewed, patient examined, no change in status, stable for surgery.  I have reviewed the patient's chart and labs.  Questions were answered to the patient's satisfaction.  DISCUSSED PROCEDURE, BENEFITS, and RISKS with wife. Melanis Ambrose as witness. VERBAL CONSENT OBTAINED.   [employer]

## 2016-07-30 NOTE — Op Note (Signed)
[facility] Patient Name: Andrew Zhang Procedure Date: 07/30/2016 2:05 PM MRN: [id number] Date of Birth: June 28, 1934 Attending MD: Barney Drain , MD CSN: [id number] Age: 82 Admit Type: Inpatient Procedure:                Upper GI endoscopy WITH PYLORIC DILATION AND GIVENS                            CAPSULE PLACEMENT Indications:              Suspected upper gastrointestinal bleeding in                            patient with chronic blood loss, Melena FOR MONTHS.                            LAST TCS 2016. TAKING ASA AND NAPROXEN PSHx: AAA                            REPAIR ~6 YRS AGO. Providers:                Barney Drain, MD, Tammy Vaught, RN, Purcell Nails                            Tina Griffiths, Technician Referring MD:             [name] MD, MD Medicines:                Fentanyl 50 micrograms IV, Midazolam 4 mg IV Complications:            No immediate complications. Estimated Blood Loss:     Estimated blood loss was minimal. Procedure:                Pre-Anesthesia Assessment:                           - Prior to the procedure, a History and Physical                            was performed, and patient medications and                            allergies were reviewed. The patient's tolerance of                            previous anesthesia was also reviewed. The risks                            and benefits of the procedure and the sedation                            options and risks were discussed with the patient.                            All questions were answered, and informed consent  was obtained. Prior Anticoagulants: The patient has                            taken naproxen, last dose was 1 week prior to                            procedure. ASA Grade Assessment: II - A patient                            with mild systemic disease. After reviewing the                            risks and benefits, the patient was deemed in            satisfactory condition to undergo the procedure.                            After obtaining informed consent, the endoscope was                            passed under direct vision. Throughout the                            procedure, the patient's blood pressure, pulse, and                            oxygen saturations were monitored continuously. The                            [id number] scope was introduced through the                            mouth, and advanced to the second part of duodenum.                            The upper GI endoscopy was somewhat difficult due                            to stenosis. The patient tolerated the procedure                            well. Scope In: 2:31:35 PM Scope Out: 3:03:41 PM Total Procedure Duration: 0 hours 32 minutes 6 seconds  Findings:      A non-obstructing Schatzki ring (acquired) was found at the       gastroesophageal junction.      Multiple small sessile polyps with no bleeding and no stigmata of recent       bleeding were found in the gastric fundus and in the gastric body.      GIVENS CAPSULE INITIALLY RELEASE BUT REPELLED BY PYLORUS. A       benign-appearing, intrinsic moderate stenosis was found at the pylorus.       This was traversed. A TTS dilator was passed through the scope. Dilation       with a 12-13.5-15 mm pyloric balloon  dilator was performed. The dilation       site was examined following endoscope reinsertion and showed moderate       improvement in luminal narrowing. GIVENS CAPSULE RETRIEVED AND RELEASED       THROUGH PYLORUS IN DUODENAL BULB FOLLOWING DILATION.      The examined duodenum was normal. Impression:               - Non-obstructing Schatzki ring.                           - Multiple gastric polyps.                           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                            Dilated.                           - NO SOURCE FOR MELENA IDENTIFIED. GIVENS STUDY                             PENDING. Moderate Sedation:      Moderate (conscious) sedation was administered by the endoscopy nurse       and supervised by the endoscopist. The following parameters were       monitored: oxygen saturation, heart rate, blood pressure, and response       to care. Total physician intraservice time was 38 minutes. Recommendation:           - Return patient to hospital ward.                           - NPO for 4 hours.                           - Continue present medications.                           - Clear liquid diet today @1900 , then advance as                            tolerated to low fat/DIABETIC DIET@2300 . Procedure Code(s):        --- Professional ---                           [phone], Esophagogastroduodenoscopy, flexible,                            transoral; with dilation of gastric/duodenal                            stricture(s) (eg, balloon, bougie)                           99152, Moderate sedation services provided by the  same physician or other qualified health care                            professional performing the diagnostic or                            therapeutic service that the sedation supports,                            requiring the presence of an independent trained                            observer to assist in the monitoring of the                            patient's level of consciousness and physiological                            status; initial 15 minutes of intraservice time,                            patient age 67 years or older                           [phone], Moderate sedation services; each additional                            15 minutes intraservice time                           99153, Moderate sedation services; each additional                            15 minutes intraservice time Diagnosis Code(s):        --- Professional ---                           K22.2, Esophageal obstruction                            K31.7, Polyp of stomach and duodenum                           K31.1, Adult hypertrophic pyloric stenosis                           R58, Hemorrhage, not elsewhere classified                           K92.1, Melena (includes Hematochezia) CPT copyright 2016 American [employer] Association. All rights reserved. The codes documented in this report are preliminary and upon coder review may  be revised to meet current compliance requirements. Barney Drain, MD Barney Drain, MD 07/30/2016 3:19:51 PM This report has been signed electronically. Number of Addenda: 0

## 2016-07-30 NOTE — Care Management Note (Signed)
Case Management Note  Patient Details  Name: Andrew Zhang MRN: [id number] Date of Birth: Jun 27, 1934  Subjective/Objective:  Patient is from home, adm with GI Bleed. Ind with ADL's. Has a cane he has been using recently due to dizziness. He has a PCP and medicare, reports no issues.                    Action/Plan: Anticipate DC home with self care. No CM needs identified.    Expected Discharge Date:  07/31/16               Expected Discharge Plan:  Home/Self Care  In-House Referral:  NA  Discharge planning Services  CM Consult  Post Acute Care Choice:    Choice offered to:     DME Arranged:    DME Agency:     HH Arranged:    HH Agency:     Status of Service:  In process, will continue to follow  If discussed at Long Length of Stay Meetings, dates discussed:    Additional Comments:  [name], Chauncey Reading, RN 07/30/2016, 12:26 PM

## 2016-07-31 ENCOUNTER — Telehealth: Payer: Self-pay | Admitting: Gastroenterology

## 2016-07-31 LAB — CBC
HEMATOCRIT: 25.3 % — AB (ref 39.0–52.0)
Hemoglobin: 7.8 g/dL — ABNORMAL LOW (ref 13.0–17.0)
MCH: 26.1 pg (ref 26.0–34.0)
MCHC: 30.8 g/dL (ref 30.0–36.0)
MCV: 84.6 fL (ref 78.0–100.0)
Platelets: 119 10*3/uL — ABNORMAL LOW (ref 150–400)
RBC: 2.99 MIL/uL — ABNORMAL LOW (ref 4.22–5.81)
RDW: 15.7 % — AB (ref 11.5–15.5)
WBC: 3.7 10*3/uL — ABNORMAL LOW (ref 4.0–10.5)

## 2016-07-31 LAB — GLUCOSE, CAPILLARY
Glucose-Capillary: 132 mg/dL — ABNORMAL HIGH (ref 65–99)
Glucose-Capillary: 132 mg/dL — ABNORMAL HIGH (ref 65–99)
Glucose-Capillary: 139 mg/dL — ABNORMAL HIGH (ref 65–99)
Glucose-Capillary: 199 mg/dL — ABNORMAL HIGH (ref 65–99)

## 2016-07-31 LAB — TYPE AND SCREEN
ABO/RH(D): B POS
Antibody Screen: NEGATIVE
UNIT DIVISION: 0
UNIT DIVISION: 0
Unit division: 0

## 2016-07-31 LAB — BASIC METABOLIC PANEL
Anion gap: 6 (ref 5–15)
BUN: 30 mg/dL — AB (ref 6–20)
CO2: 23 mmol/L (ref 22–32)
CREATININE: 1.9 mg/dL — AB (ref 0.61–1.24)
Calcium: 8.4 mg/dL — ABNORMAL LOW (ref 8.9–10.3)
Chloride: 111 mmol/L (ref 101–111)
GFR calc Af Amer: 37 mL/min — ABNORMAL LOW (ref >60)
GFR, EST NON AFRICAN AMERICAN: 32 mL/min — AB (ref >60)
GLUCOSE: 137 mg/dL — AB (ref 65–99)
POTASSIUM: 4.4 mmol/L (ref 3.5–5.1)
Sodium: 140 mmol/L (ref 135–145)

## 2016-07-31 MED ORDER — FERROUS SULFATE 325 (65 FE) MG PO TABS: 325.0000 mg | ORAL_TABLET | Freq: Two times a day (BID) | ORAL | 0 refills | Status: DC

## 2016-07-31 MED ORDER — OMEPRAZOLE 20 MG PO CPDR: 20.0000 mg | DELAYED_RELEASE_CAPSULE | Freq: Two times a day (BID) | ORAL | 0 refills | Status: DC

## 2016-07-31 NOTE — Telephone Encounter (Signed)
OPV IN 6 WEEKS WITH RGA E30 DX: OBSCURE/OVER GI BLEED.

## 2016-07-31 NOTE — Progress Notes (Signed)
PROGRESS NOTE  Andrew Zhang [id number] DOB: [birth year] DOA: 07/29/2016 PCP: [name], MD  Brief Narrative: 82 year old man presenting with fatigue and lightheadedness as well as reported black stool after being told by his physician to go to the emergency department for anemia. Repeat blood work in the emergency department confirmed profound anemia.  Assessment/Plan: 1. Symptomatic normocytic anemia. Presumed GI bleed although source unidentified. No bleeding during hospitalization. Hemoglobin responded appropriately to 3 units packed red blood cells. Anemia panel was not checked prior to transfusion. History and presentation suggests subacute bleed. 2. GI bleed, presumed upper as supported by history and elevated BUN. However EGD did not reveal source of bleeding in capsule study was unremarkable. Discussed with Dr. Oneida Alar of GI, she recommended PPI, outpatient follow-up, against colonoscopy now as the patient has had a colonoscopy approximately within the last year. No NSAIDs. 3. Thrombocytopenia, likely related to acute illness, also reportedly chronic (see cardiology note from July visit). He has been referred to hematology as an outpatient. 4. Suspected CKD stage III, although no prior records. ACE inhibitor on hold.  BUN trending down, creatinine remained stable suggesting chronic kidney disease. Patient does report that he has been told that he should not receive dye to avoid kidney damage. 5. DM type 2. Metformin being held. A1c elevated at 6.1. On SSI. 6. CAD s/p CABG. ASA in being held in the setting of possible GI bleed. No complaints of chest pain at this time. 7. Prostate cancer/BPH.    Overall improving. Hgb stabilized s/p 3 units pRBCs.  D/C telemetry Per GI:  AVOID IBUPROFEN/NAPROXEN. RE-START ASA ON AUG 7AVOID IBUPROFEN/NAPROXEN. RE-START ASA ON AUG 7 OPV IN 6 WEEKS WITH RGA DX: OBSCURE/OVER GI BLEED.  DVT prophylaxis: SCDs Code Status: full Family Communication:  discussed with patient and wife at the bedside Disposition Plan: Discharge home once improved/ cleared by GI  Murray Hodgkins, MD  [facility] Direct contact: [phone] --[place]ia [place]  --[email or web address]; password [email or web address]  7PM-7AM contact night coverage as above 07/31/2016, 7:24 AM  LOS: 1 day   Consultants:   Gastroenterology  Procedures:   3 units pRBC transfusion 8/1  EGD 8/01 Impression:               - Non-obstructing Schatzki ring.                           - Multiple gastric polyps.                           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                            Dilated.                           - NO SOURCE FOR MELENA IDENTIFIED. GIVENS STUDY                            PENDING.  Antimicrobials:   none   HPI/Subjective: Feels well today. Denies any bleeding, nausea, or vomiting. Has adequate appetite.   Objective: Vitals:   07/30/16 1425 07/30/16 1603 07/30/16 2123 07/31/16 0457  BP: (!) 154/73 (!) 155/71 (!) 146/62 138/74  Pulse: 65 62 66 70  Resp: (!) 21 20  20 18  Temp:  97.6 F (36.4 C) 98.2 F (36.8 C) 98.2 F (36.8 C)  TempSrc:  Oral Oral Oral  SpO2: 100% 97% 100% 100%  Weight:    95.6 kg (210 lb 12.8 oz)  Height:        Intake/Output Summary (Last 24 hours) at 07/31/16 0724 Last data filed at 07/30/16 1801  Gross per 24 hour  Intake           1567.5 ml  Output              200 ml  Net           1367.5 ml     Filed Weights   07/29/16 1738 07/30/16 0500 07/31/16 0457  Weight: 94.4 kg (208 lb 1.6 oz) 94.3 kg (207 lb 14.4 oz) 95.6 kg (210 lb 12.8 oz)    Exam:    Constitutional:  . Appears calm and comfortable Respiratory:  . CTA bilaterally, no w/r/r.  . Respiratory effort normal. No retractions or accessory muscle use Cardiovascular:  . RRR, no m/r/g . No LE extremity edema   . Telemetry SR Abdomen:  . no tenderness or masses Musculoskeletal:  o Moves all extremities to command Psychiatric:  . judgement  and insight appear normal . Mental status o Mood, affect appropriate   I have personally reviewed following labs and imaging studies:  BUN 30, Cr 1.9, trending down  Glucose 137, CBG 199  WBC 3.7  Hgb 7.8, Htc 25.3, improving.  Platelets 119, stable  Scheduled Meds: . finasteride  5 mg Oral Daily  . insulin aspart  0-9 Units Subcutaneous TID WC  . pantoprazole  40 mg Oral BID AC  . tamsulosin  0.4 mg Oral QPC supper   Continuous Infusions:   Principal Problem:   Anemia Active Problems:   CAD (coronary artery disease) of artery bypass graft   Type 2 diabetes mellitus without complications ([place])   Acute on chronic renal failure ([place])   Melena   LOS: 1 day   Time spent 25 minutes  By signing my name below, I, Delene Ruffini, attest that this documentation has been prepared under the direction and in the presence of Lanore Renderos P. Sarajane Jews, MD. Electronically Signed: Delene Ruffini, Scribe.  07/31/16 12:45pm    I personally performed the services described in this documentation. All medical record entries made by the scribe were at my direction. I have reviewed the chart and agree that the record reflects my personal performance and is accurate and complete. Murray Hodgkins, MD

## 2016-07-31 NOTE — Procedures (Signed)
Small Bowel Givens Capsule Study Procedure date:  07/30/16 REVIEWED-SEE ADDITIONAL RECOMMENDATIONS BELOW.  Referring Provider:  Dr. Oneida Alar  PCP:  Dr. [name], MD  Indication for procedure:  82 year old male admitted with profound anemia, reports of melena for several months, EGD with non-obstructing Schatzki's ring, multiple gastric polyps, pyloric stenosis at the pylorus s/p dilation with need for capsule to evaluate small bowel. Capsule released through pylorus following dilation. Last colonoscopy approximately 2016 by Dr. Britta Mccreedy.    Findings:   Capsule study is complete to the cecum. No obvious lesions, mass, tumors. Small wisps of blood noted in small bowel secondary to EGD/dilation. Possible few erosions in setting of NSAIDs. No obvious areas of bleeding.   First Duodenal image: 00:19:37 First Cecal image: 03:48:26 Small Bowel Passage time:  3h 38m  Summary & Recommendations: Obscure GI bleed, transfusion dependent anemia with EGD and capsule study without obvious source. Recommend absolute avoidance of NSAIDs. Continue BID Protonix. Upon review of notes, hematology referral has been initiated as outpatient. Labs reviewed: pancytopenia noted dating back on outside labs. May need hematology referral while inpatient vs outpatient. Need to obtain colonoscopy reports from Dr. Britta Mccreedy from last year; I have spoken with the unit secretary personally to obtain this. CBC IN 2 WEEKS. OPV IN 6 WEEKS.  Orvil Feil, ANP-BC [facility]

## 2016-07-31 NOTE — Discharge Summary (Signed)
Physician Discharge Summary  Andrew Zhang [id number] DOB: October 07, 1934 DOA: 07/29/2016  PCP: Andrew Savage, MD  Admit date: 07/29/2016 Discharge date: 07/31/2016  Recommendations for Outpatient Follow-up:  1. Symptomatic anemia, presumed upper GI bleed, see discussion below. Consider repeat CBC in follow-up. Additionally Dr. Oneida Zhang has plans to check CBC in 2 weeks.  2. Follow-up mild thrombocytopenia 3. Follow-up suspected chronic kidney disease stage III. Consider risk/benefit of metformin.   Follow-up Information    Andrew Savage, MD. Schedule an appointment as soon as possible for a visit in 1 week(s).   Specialty:  Family Medicine Contact information: [place] [address] [place], [place] Follow up in 6 week(s).   Specialty:  Gastroenterology Contact information: [address] [place] [address] [phone]          Discharge Diagnoses:  1. Symptomatic normocytic anemia 2. GI bleed, presumed upper 3. Thrombocytopenia 4. Suspected chronic kidney disease stage III 5. Diabetes mellitus type 2  Discharge Condition: improved Disposition: home  Diet recommendation: diabetic, heart healthy  Filed Weights   07/29/16 1738 07/30/16 0500 07/31/16 0457  Weight: 94.4 kg (208 lb 1.6 oz) 94.3 kg (207 lb 14.4 oz) 95.6 kg (210 lb 12.8 oz)    History of present illness:  82 year old man presenting with fatigue and lightheadedness as well as reported black stool after being told by his physician to go to the emergency department for anemia. Repeat blood work in the emergency department confirmed profound anemia.  Hospital Course:  Patient was transfused with 3 units packed red blood cells with appropriate response. No bleeding was noted. Seen by gastroenterology, workup included EGD and capsule study which were unremarkable. Given no bleeding, plans were made for outpatient follow-up with GI and PCP. Of note, the patient has been  referred to hematology by his outpatient cardiologist. Residual issues as below.  1. Symptomatic normocytic anemia. Presumed GI bleed although source unidentified. No bleeding during hospitalization. Hemoglobin responded appropriately to 3 units packed red blood cells. Anemia panel was not checked prior to transfusion. History and presentation suggests subacute bleed. 2. GI bleed, presumed upper as supported by history and elevated BUN. However EGD did not reveal source of bleeding in capsule study was unremarkable. Discussed with Dr. Oneida Zhang of GI, she recommended PPI, outpatient follow-up, against colonoscopy now as the patient has had a colonoscopy approximately within the last year. No NSAIDs. 3. Thrombocytopenia, likely related to acute illness, also reportedly chronic (see cardiology note from July visit). He has been referred to hematology as an outpatient. 4. Suspected CKD stage III, although no prior records. ACE inhibitor on hold.  BUN trending down, creatinine remained stable suggesting chronic kidney disease. Patient does report that he has been told that he should not receive dye to avoid kidney damage. 5. DM type 2. Metformin being held. A1c elevated at 6.1. On SSI. 6. CAD s/p CABG. ASA in being held in the setting of possible GI bleed. No complaints of chest pain at this time. 7. Prostate cancer/BPH.   Consultants:  Gastroenterology  Procedures:  3units pRBC transfusion 8/1  EGD 8/01 Impression: - Non-obstructing Schatzki ring. - Multiple gastric polyps. 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  Dilated. - NO SOURCE FOR MELENA IDENTIFIED. GIVENS STUDY  PENDING.  Antimicrobials:   none   Discharge Instructions  Discharge Instructions    Diet - low sodium heart healthy    Complete by:  As directed   Diet Carb Modified     Complete by:  As directed   Discharge instructions    Complete by:  As directed   Call your physician or seek immediate medical attention for bleeding, weakness, falls, dizziness, shortness of breath, pain or worsening of condition. You can restart aspirin on 8/7.   Increase activity slowly    Complete by:  As directed       Medication List    STOP taking these medications   aspirin 81 MG tablet     TAKE these medications   CALTRATE 600+D 600-400 MG-UNIT tablet Generic drug:  Calcium Carbonate-Vitamin D Take 1 tablet by mouth daily.   ferrous sulfate 325 (65 FE) MG tablet Take 1 tablet (325 mg total) by mouth 2 (two) times daily with a meal.   finasteride 5 MG tablet Commonly known as:  PROSCAR Take 5 mg by mouth daily.   glimepiride 2 MG tablet Commonly known as:  AMARYL Take 2 mg by mouth daily with breakfast.   lisinopril 2.5 MG tablet Commonly known as:  PRINIVIL,ZESTRIL Take 2.5 mg by mouth daily.   metFORMIN 500 MG tablet Commonly known as:  GLUCOPHAGE Take 500 mg by mouth every evening.   nitroGLYCERIN 0.4 MG SL tablet Commonly known as:  NITROSTAT Place 1 tablet (0.4 mg total) under the tongue every 5 (five) minutes as needed for chest pain.   omeprazole 20 MG capsule Commonly known as:  PRILOSEC Take 1 capsule (20 mg total) by mouth 2 (two) times daily before a meal. What changed:  when to take this   sertraline 100 MG tablet Commonly known as:  ZOLOFT Take 50 mg by mouth daily.   simvastatin 80 MG tablet Commonly known as:  ZOCOR Take 80 mg by mouth at bedtime.   tamsulosin 0.4 MG Caps capsule Commonly known as:  FLOMAX Take 0.4 mg by mouth daily after supper.   testosterone cypionate 100 MG/ML injection Commonly known as:  DEPOTESTOTERONE CYPIONATE Inject 100 mg into the muscle every 3 (three) months.      Allergies  Allergen Reactions  . Penicillins Rash    The results of significant diagnostics from this hospitalization (including  imaging, microbiology, ancillary and laboratory) are listed below for reference.    Significant Diagnostic Studies: Dg Chest 2 View  Result Date: 07/29/2016 CLINICAL DATA:  82 year old male with weakness, dizziness, shortness of breath for 3 weeks. Anemia. Former smoker. Initial encounter. EXAM: CHEST  2 VIEW COMPARISON:  [facility] chest radiographs 06/26/2016 and earlier. CT Abdomen and Pelvis 06/26/2016 FINDINGS: Stable mediastinal contours. Mild cardiomegaly. Sequelae of CABG. Visualized tracheal air column is within normal limits. Partially visible abdominal aortic endograft. Stable lung volumes. No pneumothorax, pulmonary edema, pleural effusion or acute pulmonary opacity. Calcified thoracic aortic atherosclerosis. No acute osseous abnormality identified. Stable cholecystectomy clips. IMPRESSION: 1.  No acute cardiopulmonary abnormality. 2. Calcified aortic atherosclerosis. Electronically Signed   By: Genevie Ann M.D.   On: 07/29/2016 16:19   Labs: Basic Metabolic Panel:  Recent Labs Lab 07/29/16 1523 07/30/16 0448 07/31/16 0518  NA 137 141 140  K 4.4 4.3 4.4  CL 107 111 111  CO2 21* 24 23  GLUCOSE 134* 109* 137*  BUN 39* 40* 30*  CREATININE 2.06* 1.95* 1.90*  CALCIUM 8.6* 8.4* 8.4*   Liver Function Tests:  Recent Labs Lab 07/29/16 1523 07/30/16 0448  AST 27 19  ALT 23 19  ALKPHOS 47 41  BILITOT 0.4 0.7  PROT 6.5 5.5*  ALBUMIN 3.5 2.9*   CBC:  Recent Labs Lab 07/29/16 1523 07/30/16 0448 07/31/16 0518  WBC 5.8 4.3 3.7*  NEUTROABS 4.4 2.5  --   HGB 5.7* 6.7* 7.8*  HCT 18.7* 22.0* 25.3*  MCV 85.0 84.9 84.6  PLT 154 121* 119*   Cardiac Enzymes:  Recent Labs Lab 07/29/16 1523  TROPONINI <0.03       CBG:  Recent Labs Lab 07/30/16 2012 07/31/16 0027 07/31/16 0456 07/31/16 0718 07/31/16 1115  GLUCAP 120* 139* 132* 132* 199*    Principal Problem:   Anemia Active Problems:   CAD (coronary artery disease) of artery bypass graft   Type  2 diabetes mellitus without complications ([place])   Acute on chronic renal failure ([place])   Melena   Time coordinating discharge: 35 minutes  Signed:  Murray Hodgkins, MD [facility] 07/31/2016, 6:43 PM

## 2016-07-31 NOTE — Progress Notes (Signed)
Pt discharged home today per Dr. Goodrich. Pt's IV site D/C'd and WDL. Pt's VSS. Pt provided with home medication list, discharge instructions and prescriptions. Verbalized understanding. Pt left floor via WC in stable condition accompanied by NT. 

## 2016-07-31 NOTE — Progress Notes (Signed)
    Subjective: No overt GI bleeding. No abdominal pain. Tolerated breakfast.   Objective: Vital signs in last 24 hours: Temp:  [97.6 F (36.4 C)-98.2 F (36.8 C)] 98.2 F (36.8 C) (08/02 0457) Pulse Rate:  [51-85] 70 (08/02 0457) Resp:  [13-21] 18 (08/02 0457) BP: (138-171)/(59-88) 138/74 (08/02 0457) SpO2:  [90 %-100 %] 100 % (08/02 0457) Weight:  [210 lb 12.8 oz (95.6 kg)] 210 lb 12.8 oz (95.6 kg) (08/02 0457) Last BM Date: 07/29/16 General:   Alert and oriented, pleasant Head:  Normocephalic and atraumatic. Abdomen:  Bowel sounds present, soft, obese, query small umbilical hernia.  Neurologic:  Alert and  oriented x4 Intake/Output from previous day: 08/01 0701 - 08/02 0700 In: 1807.5 [P.O.:240; I.V.:1237.5; Blood:330] Out: 200 [Urine:200] Intake/Output this shift: No intake/output data recorded.  Lab Results:  Recent Labs  07/29/16 1523 07/30/16 0448 07/31/16 0518  WBC 5.8 4.3 3.7*  HGB 5.7* 6.7* 7.8*  HCT 18.7* 22.0* 25.3*  PLT 154 121* 119*   BMET  Recent Labs  07/29/16 1523 07/30/16 0448 07/31/16 0518  NA 137 141 140  K 4.4 4.3 4.4  CL 107 111 111  CO2 21* 24 23  GLUCOSE 134* 109* 137*  BUN 39* 40* 30*  CREATININE 2.06* 1.95* 1.90*  CALCIUM 8.6* 8.4* 8.4*   LFT  Recent Labs  07/29/16 1523 07/30/16 0448  PROT 6.5 5.5*  ALBUMIN 3.5 2.9*  AST 27 19  ALT 23 19  ALKPHOS 47 41  BILITOT 0.4 0.7   PT/INR  Recent Labs  07/29/16 1523  LABPROT 13.8  INR 1.05    Studies/Results: Dg Chest 2 View  Result Date: 07/29/2016 CLINICAL DATA:  82 year old male with weakness, dizziness, shortness of breath for 3 weeks. Anemia. Former smoker. Initial encounter. EXAM: CHEST  2 VIEW COMPARISON:  [facility] chest radiographs 06/26/2016 and earlier. CT Abdomen and Pelvis 06/26/2016 FINDINGS: Stable mediastinal contours. Mild cardiomegaly. Sequelae of CABG. Visualized tracheal air column is within normal limits. Partially visible abdominal  aortic endograft. Stable lung volumes. No pneumothorax, pulmonary edema, pleural effusion or acute pulmonary opacity. Calcified thoracic aortic atherosclerosis. No acute osseous abnormality identified. Stable cholecystectomy clips. IMPRESSION: 1.  No acute cardiopulmonary abnormality. 2. Calcified aortic atherosclerosis. Electronically Signed   By: Genevie Ann M.D.   On: 07/29/2016 16:19    Assessment/Plan: 82 year old male presenting with profound anemia, melena, and history notable for taking Aleve up to twice a day for at least the past 4-5 months. Total of 3 units PRBCs this admission with Hgb improving only up to 2 grams, now 7.8. EGD 8/1 with non-obstructing Schatzki's ring, multiple gastric polyps, pyloric stenosis at the pylorus s/p dilation, capsule released through pylorus in doudenal bulb following dilation.   No further GI bleeding and capsule to be read today. No source for melena or anemia on EGD. Further recommendations after capsule reviewed later today.    Orvil Feil, ANP-BC [facility]     LOS: 1 day    07/31/2016, 10:34 AM

## 2016-07-31 NOTE — Care Management Important Message (Signed)
Important Message  Patient Details  Name: TERRALL HINZMAN MRN: [id number] Date of Birth: 01-Aug-1934   Medicare Important Message Given:  Yes    Demetrius Barrell, Chauncey [place], RN 07/31/2016, 12:01 PM

## 2016-08-01 ENCOUNTER — Encounter (HOSPITAL_COMMUNITY): Payer: Self-pay

## 2016-08-01 ENCOUNTER — Encounter (HOSPITAL_COMMUNITY): Payer: Self-pay | Admitting: Gastroenterology

## 2016-08-02 ENCOUNTER — Encounter (HOSPITAL_COMMUNITY): Payer: Medicare Other

## 2016-08-02 ENCOUNTER — Encounter (HOSPITAL_COMMUNITY): Payer: Self-pay

## 2016-08-02 ENCOUNTER — Encounter (HOSPITAL_COMMUNITY): Payer: Medicare Other | Attending: Hematology | Admitting: Hematology

## 2016-08-02 VITALS — BP 144/58 | HR 73 | Temp 97.9°F | Resp 20 | Ht 72.0 in | Wt 211.7 lb

## 2016-08-02 DIAGNOSIS — E538 Deficiency of other specified B group vitamins: Secondary | ICD-10-CM | POA: Diagnosis not present

## 2016-08-02 DIAGNOSIS — C61 Malignant neoplasm of prostate: Secondary | ICD-10-CM

## 2016-08-02 DIAGNOSIS — D649 Anemia, unspecified: Secondary | ICD-10-CM | POA: Diagnosis not present

## 2016-08-02 DIAGNOSIS — I6523 Occlusion and stenosis of bilateral carotid arteries: Secondary | ICD-10-CM

## 2016-08-02 DIAGNOSIS — K921 Melena: Secondary | ICD-10-CM

## 2016-08-02 LAB — CBC WITH DIFFERENTIAL/PLATELET
Basophils Absolute: 0 10*3/uL (ref 0.0–0.1)
Basophils Relative: 0 %
EOS ABS: 0.2 10*3/uL (ref 0.0–0.7)
EOS PCT: 4 %
HCT: 30.8 % — ABNORMAL LOW (ref 39.0–52.0)
Hemoglobin: 9.6 g/dL — ABNORMAL LOW (ref 13.0–17.0)
LYMPHS ABS: 1.1 10*3/uL (ref 0.7–4.0)
LYMPHS PCT: 21 %
MCH: 27 pg (ref 26.0–34.0)
MCHC: 31.2 g/dL (ref 30.0–36.0)
MCV: 86.5 fL (ref 78.0–100.0)
MONO ABS: 0.3 10*3/uL (ref 0.1–1.0)
MONOS PCT: 6 %
Neutro Abs: 3.5 10*3/uL (ref 1.7–7.7)
Neutrophils Relative %: 69 %
PLATELETS: 136 10*3/uL — AB (ref 150–400)
RBC: 3.56 MIL/uL — AB (ref 4.22–5.81)
RDW: 16.1 % — ABNORMAL HIGH (ref 11.5–15.5)
WBC: 5.1 10*3/uL (ref 4.0–10.5)

## 2016-08-02 LAB — COMPREHENSIVE METABOLIC PANEL
ALBUMIN: 3.6 g/dL (ref 3.5–5.0)
ALK PHOS: 50 U/L (ref 38–126)
ALT: 27 U/L (ref 17–63)
ANION GAP: 4 — AB (ref 5–15)
AST: 27 U/L (ref 15–41)
BILIRUBIN TOTAL: 0.4 mg/dL (ref 0.3–1.2)
BUN: 29 mg/dL — ABNORMAL HIGH (ref 6–20)
CALCIUM: 8.8 mg/dL — AB (ref 8.9–10.3)
CO2: 26 mmol/L (ref 22–32)
Chloride: 108 mmol/L (ref 101–111)
Creatinine, Ser: 1.81 mg/dL — ABNORMAL HIGH (ref 0.61–1.24)
GFR, EST AFRICAN AMERICAN: 39 mL/min — AB (ref >60)
GFR, EST NON AFRICAN AMERICAN: 34 mL/min — AB (ref >60)
GLUCOSE: 93 mg/dL (ref 65–99)
POTASSIUM: 4.2 mmol/L (ref 3.5–5.1)
Sodium: 138 mmol/L (ref 135–145)
TOTAL PROTEIN: 7.1 g/dL (ref 6.5–8.1)

## 2016-08-02 LAB — RETICULOCYTES
RBC.: 3.62 MIL/uL — AB (ref 4.22–5.81)
RETIC CT PCT: 2.7 % (ref 0.4–3.1)
Retic Count, Absolute: 97.7 10*3/uL (ref 19.0–186.0)

## 2016-08-02 LAB — LACTATE DEHYDROGENASE: LDH: 140 U/L (ref 98–192)

## 2016-08-02 LAB — IRON AND TIBC
IRON: 182 ug/dL (ref 45–182)
SATURATION RATIOS: 41 % — AB (ref 17.9–39.5)
TIBC: 445 ug/dL (ref 250–450)
UIBC: 263 ug/dL

## 2016-08-02 LAB — FERRITIN
FERRITIN: 33 ng/mL (ref 24–336)
FERRITIN: 35 ng/mL (ref 24–336)

## 2016-08-02 LAB — VITAMIN B12: Vitamin B-12: 211 pg/mL (ref 180–914)

## 2016-08-02 NOTE — Progress Notes (Signed)
[facility]    HEMATOLOGY/ONCOLOGY CONSULTATION NOTE  Date of Service: 08/02/2016  Patient Care Team: [name], MD as PCP - General (Family Medicine) Herminio Commons, MD as Attending Physician (Cardiology)  CHIEF COMPLAINTS/PURPOSE OF CONSULTATION:  Anemia  HISTORY OF PRESENTING ILLNESS:   Andrew Zhang is a wonderful 82 y.o. male who has been referred to [place] by Dr [name], MD  for evaluation and management of Anemia.  Patient has a history of coronary artery disease status post CABG, hypertension, diabetes, dyslipidemia, abdominal aortic aneurysm status post repair, prostate cancer currently on Lupron shots every 3 months (follows with Dr Willow Ora).  Patient presented to the emergency room on 07/29/2016 with black stools and blood in the stools for one month and presented with fatigue and lightheadedness. He is hemoglobin was down to 5 he received several units of PRBC transfusion. He had an EGD and capsule study which were unremarkable. He reports that he had a positive FOB T about a year ago when he had a colonoscopy for workup. Patient reports that he was taking Aleve for pain 2 times a day for the last 6-12 months and was also on aspirin.  Patient notes that he was diagnosed with prostate cancer about a year ago and treated with radiation therapy and has since been on Lupron shots every 3 months and is being followed by his urologist. PSA on 06/13/2016 was less than 0.1.  He reports that he had B12 deficiency about 20 years ago and was on B12 replacement at that time. He has currently been placed on ferrous sulfate 1 tablet by mouth twice a day for recent blood loss anemia.  His hemoglobin has improved progressively from 5.7 to 6.7 to 7.8 and today up to 9.6.  He appears to be tolerating his oral iron.  Currently reports no further black cold or blood in the stools at this time. He has been off his aspirin which shall be restarted in a few days.   MEDICAL HISTORY:    Past Medical History:  Diagnosis Date  . AAA (abdominal aortic aneurysm) ([place])   . Arthritis   . B12 deficiency   . CAD (coronary artery disease)   . Cancer ([place])   . Depression   . Diverticulitis   . DMII (diabetes mellitus, type 2) ([place]) 2008  . GERD (gastroesophageal reflux disease)   . Hiatal hernia   . HTN (hypertension)   . Hyperlipidemia   . Prostate cancer [facility])     SURGICAL HISTORY: Past Surgical History:  Procedure Laterality Date  . APPENDECTOMY    . BACK SURGERY    . CHOLECYSTECTOMY    . COLONOSCOPY     about a year ago (2015/2016 per patient)   . CORONARY ARTERY BYPASS GRAFT    . DESCENDING AORTIC ANEURYSM REPAIR W/ STENT    . ESOPHAGOGASTRODUODENOSCOPY N/A 07/30/2016   Procedure: ESOPHAGOGASTRODUODENOSCOPY (EGD);  Surgeon: Danie Binder, MD;  Location: [facility]ITE;  Service: Endoscopy;  Laterality: N/A;    SOCIAL HISTORY: Social History   Social History  . Marital status: Married    Spouse name: N/A  . Number of children: N/A  . Years of education: N/A   Occupational History  . Not on file.   Social History Main Topics  . Smoking status: Former Smoker    Packs/day: 3.00    Years: 45.00    Types: Cigarettes    Start date: 04/19/1951    Quit date: 03/01/1994  . Smokeless tobacco:  Never Used  . Alcohol use 0.0 oz/week     Comment: 1-2 beers daily   . Drug use: No  . Sexual activity: Not on file   Other Topics Concern  . Not on file   Social History Narrative  . No narrative on file    FAMILY HISTORY: Family History  Problem Relation Age of Onset  . Heart disease    . Diabetes    . Hypertension    . Liver cancer Mother   . Colon cancer Neg Hx     ALLERGIES:  is allergic to penicillins.  MEDICATIONS:  Current Outpatient Prescriptions  Medication Sig Dispense Refill  . Calcium Carbonate-Vitamin D (CALTRATE 600+D) 600-400 MG-UNIT per tablet Take 1 tablet by mouth daily.    . ferrous sulfate 325 (65 FE) MG tablet Take 1 tablet (325  mg total) by mouth 2 (two) times daily with a meal. 60 tablet 0  . finasteride (PROSCAR) 5 MG tablet Take 5 mg by mouth daily.    Marland Kitchen glimepiride (AMARYL) 2 MG tablet Take 2 mg by mouth daily with breakfast.     . lisinopril (PRINIVIL,ZESTRIL) 2.5 MG tablet Take 2.5 mg by mouth daily.    . metFORMIN (GLUCOPHAGE) 500 MG tablet Take 500 mg by mouth every evening.    . nitroGLYCERIN (NITROSTAT) 0.4 MG SL tablet Place 1 tablet (0.4 mg total) under the tongue every 5 (five) minutes as needed for chest pain. 25 tablet 3  . omeprazole (PRILOSEC) 20 MG capsule Take 1 capsule (20 mg total) by mouth 2 (two) times daily before a meal. 60 capsule 0  . sertraline (ZOLOFT) 100 MG tablet Take 50 mg by mouth daily.    . simvastatin (ZOCOR) 80 MG tablet Take 80 mg by mouth at bedtime.    . tamsulosin (FLOMAX) 0.4 MG CAPS Take 0.4 mg by mouth daily after supper.    . testosterone cypionate (DEPOTESTOTERONE CYPIONATE) 100 MG/ML injection Inject 100 mg into the muscle every 3 (three) months.     No current facility-administered medications for this visit.     REVIEW OF SYSTEMS:    10 Point review of Systems was done is negative except as noted above.  PHYSICAL EXAMINATION: ECOG PERFORMANCE STATUS: 2 - Symptomatic, <50% confined to bed  . Vitals:   08/02/16 1302  BP: (!) 144/58  Pulse: 73  Resp: 20  Temp: 97.9 F (36.6 C)   Filed Weights   08/02/16 1302  Weight: 211 lb 11.2 oz (96 kg)   .Body mass index is 28.71 kg/m.  GENERAL:alert, in no acute distress and comfortable SKIN: skin color, texture, turgor are normal, no rashes or significant lesions EYES: normal, conjunctiva are pink and non-injected, sclera clear OROPHARYNX:no exudate, no erythema and lips, buccal mucosa, and tongue normal  NECK: supple, no JVD, thyroid normal size, non-tender, without nodularity LYMPH:  no palpable lymphadenopathy in the cervical, axillary or inguinal LUNGS: clear to auscultation with normal respiratory  effort HEART: regular rate & rhythm,  no murmurs and no lower extremity edema ABDOMEN: abdomen soft, non-tender, normoactive bowel sounds  Musculoskeletal: no cyanosis of digits and no clubbing  PSYCH: alert & oriented x 3 with fluent speech NEURO: no focal motor/sensory deficits  LABORATORY DATA:  I have reviewed the data as listed  . CBC Latest Ref Rng & Units 08/02/2016 07/31/2016 07/30/2016  WBC 4.0 - 10.5 K/uL 5.1 3.7(L) 4.3  Hemoglobin 13.0 - 17.0 g/dL 9.6(L) 7.8(L) 6.7(LL)  Hematocrit 39.0 - 52.0 % 30.8(L)  25.3(L) 22.0(L)  Platelets 150 - 400 K/uL 136(L) 119(L) 121(L)   . CBC    Component Value Date/Time   WBC 5.1 08/02/2016 1438   RBC 3.62 (L) 08/02/2016 1438   RBC 3.56 (L) 08/02/2016 1438   HGB 9.6 (L) 08/02/2016 1438   HCT 30.8 (L) 08/02/2016 1438   PLT 136 (L) 08/02/2016 1438   MCV 86.5 08/02/2016 1438   MCH 27.0 08/02/2016 1438   MCHC 31.2 08/02/2016 1438   RDW 16.1 (H) 08/02/2016 1438   LYMPHSABS 1.1 08/02/2016 1438   MONOABS 0.3 08/02/2016 1438   EOSABS 0.2 08/02/2016 1438   BASOSABS 0.0 08/02/2016 1438    . CMP Latest Ref Rng & Units 08/02/2016 07/31/2016 07/30/2016  Glucose 65 - 99 mg/dL 93 137(H) 109(H)  BUN 6 - 20 mg/dL 29(H) 30(H) 40(H)  Creatinine 0.61 - 1.24 mg/dL 1.81(H) 1.90(H) 1.95(H)  Sodium 135 - 145 mmol/L 138 140 141  Potassium 3.5 - 5.1 mmol/L 4.2 4.4 4.3  Chloride 101 - 111 mmol/L 108 111 111  CO2 22 - 32 mmol/L 26 23 24   Calcium 8.9 - 10.3 mg/dL 8.8(L) 8.4(L) 8.4(L)  Total Protein 6.5 - 8.1 g/dL 7.1 - 5.5(L)  Total Bilirubin 0.3 - 1.2 mg/dL 0.4 - 0.7  Alkaline Phos 38 - 126 U/L 50 - 41  AST 15 - 41 U/L 27 - 19  ALT 17 - 63 U/L 27 - 19   Component     Latest Ref Rng & Units 08/02/2016         2:38 PM  Iron     45 - 182 ug/dL 182  TIBC     250 - 450 ug/dL 445  Saturation Ratios     17.9 - 39.5 % 41 (H)  UIBC     ug/dL 263  Retic Ct Pct     0.4 - 3.1 % 2.7  RBC.     4.22 - 5.81 MIL/uL 3.62 (L)  Retic Count, Manual     19.0 - 186.0  K/uL 97.7  Ferritin     24 - 336 ng/mL 35  Vitamin B12     180 - 914 pg/mL 211  LDH     98 - 192 U/L 140  Haptoglobin     34 - 200 mg/dL 129  Testosterone     264 - 916 ng/dL <3 (L)      RADIOGRAPHIC STUDIES: I have personally reviewed the radiological images as listed and agreed with the findings in the report. Dg Chest 2 View  Result Date: 07/29/2016 CLINICAL DATA:  82 year old male with weakness, dizziness, shortness of breath for 3 weeks. Anemia. Former smoker. Initial encounter. EXAM: CHEST  2 VIEW COMPARISON:  [facility] chest radiographs 06/26/2016 and earlier. CT Abdomen and Pelvis 06/26/2016 FINDINGS: Stable mediastinal contours. Mild cardiomegaly. Sequelae of CABG. Visualized tracheal air column is within normal limits. Partially visible abdominal aortic endograft. Stable lung volumes. No pneumothorax, pulmonary edema, pleural effusion or acute pulmonary opacity. Calcified thoracic aortic atherosclerosis. No acute osseous abnormality identified. Stable cholecystectomy clips. IMPRESSION: 1.  No acute cardiopulmonary abnormality. 2. Calcified aortic atherosclerosis. Electronically Signed   By: Genevie Ann M.D.   On: 07/29/2016 16:19    ASSESSMENT & PLAN:   82 year old Caucasian male with   #1 normocytic normochromic anemia. Likely multifactorial.  -Acute drop in hemoglobin leading to her recent hospitalization was from a gastrointestinal bleed. EGD and capsule endoscopy did not show overt source of bleeding. Patient was on NSAIDs which  could be a risk factor. -Iron deficiency due to GI bleeding -No evidence of hemolysis based on normal LDH and haptoglobin. 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  #2 history of previous B12 deficiency- chronic metformin use as a risk factor. Plan -Follow-up results of myeloma panel -Absolutely avoid NSAIDs due to risk of recurrent GI  bleeding. - we'll need to monitor hemoglobin when restarted on aspirin . -Currently started on a PPI daily  -Continue ferrous sulfate 1 tablet by mouth twice a day . Hemoglobin appears to be improving . Low threshold for consideration of IV iron . In the setting of chronic kidney disease the goal for ferritin would be more than 100 . -Would replace vitamin B12 as sublingual liquid 1000 g daily for low normal levels . -Vitamin B complex daily  #3 history of prostate cancer diagnosed one year ago treated with radiation therapy -Currently on Lupron every 3 months as per his urologist.   Return to care with our clinic in 1 month with repeat CBC, CMP, ferritin, B12    All of the patients questions were answered with apparent satisfaction. The patient knows to call the clinic with any problems, questions or concerns.  I spent 60 minutes counseling the patient face to face. The total time spent in the appointment was 60 minutes and more than 50% was on counseling and direct patient cares.    Sullivan Lone MD [place] AAHIVMS [facility] Hematology/Oncology Physician [facility]  (Office):       [phone] (Work cell):  [phone] (Fax):           [phone]  08/02/2016 1:34 PM

## 2016-08-02 NOTE — Patient Instructions (Signed)
[place] at [facility] Discharge Instructions  RECOMMENDATIONS MADE BY THE CONSULTANT AND ANY TEST RESULTS WILL BE SENT TO YOUR REFERRING PHYSICIAN.  You were seen by Dr Irene Limbo today.   You will have lab work today.   Return to clinic in 4 weeks for follow-up. Call with any questions or concerns.   Thank you for choosing [place] at [facility] to provide your oncology and hematology care.  To afford each patient quality time with our provider, please arrive at least 15 minutes before your scheduled appointment time.   Beginning January 23rd 2017 lab work for the [facility] will be done in the  Main lab at [facility] on 1st floor. If you have a lab appointment with the [facility] please come in thru the  Main Entrance and check in at the main information desk  You need to re-schedule your appointment should you arrive 10 or more minutes late.  We strive to give you quality time with our providers, and arriving late affects you and other patients whose appointments are after yours.  Also, if you no show three or more times for appointments you may be dismissed from the clinic at the providers discretion.     Again, thank you for choosing [facility].  Our hope is that these requests will decrease the amount of time that you wait before being seen by our physicians.       _____________________________________________________________  Should you have questions after your visit to [facility], please contact our office at [phone] between the hours of 8:30 a.m. and 4:30 p.m.  Voicemails left after 4:30 p.m. will not be returned until the following business day.  For prescription refill requests, have your pharmacy contact our office.         Resources For Cancer Patients and their Caregivers ? American Cancer Society: Can assist with transportation, wigs, general needs, runs Look Good Feel Better.         ([phone] 877-5885 ? Cancer Care: Provides financial assistance, online support groups, medication/co-pay assistance.  1-800-813-HOPE 718-449-2688) ? [place] Assists [place] cancer patients and their families through emotional , educational and financial support.  2290051300 ? [place] DSS Where to apply for food stamps, Medicaid and utility assistance. 845-614-8574 ? RCATS: Transportation to medical appointments. (364) 126-7800 ? Social Security Administration: May apply for disability if have a Stage IV cancer. (828)743-8208 (972) 240-4835 ? [employer], Disability and Transit Services: Assists with nutrition, care and transit needs. [place] Support Programs: @10RELATIVEDAYS @ > Cancer Support Group  2nd Tuesday of the month 1pm-2pm, Journey Room  > Creative Journey  3rd Tuesday of the month 1130am-1pm, Journey Room  > Look Good Feel Better  1st Wednesday of the month 10am-12 noon, Journey Room (Call [place] to register [phone])

## 2016-08-03 LAB — TESTOSTERONE

## 2016-08-03 LAB — HAPTOGLOBIN: HAPTOGLOBIN: 129 mg/dL (ref 34–200)

## 2016-08-05 ENCOUNTER — Telehealth (HOSPITAL_COMMUNITY): Payer: Self-pay

## 2016-08-05 LAB — MULTIPLE MYELOMA PANEL, SERUM
ALBUMIN/GLOB SERPL: 1.1 (ref 0.7–1.7)
ALPHA2 GLOB SERPL ELPH-MCNC: 0.8 g/dL (ref 0.4–1.0)
Albumin SerPl Elph-Mcnc: 3.4 g/dL (ref 2.9–4.4)
Alpha 1: 0.2 g/dL (ref 0.0–0.4)
B-GLOBULIN SERPL ELPH-MCNC: 1.2 g/dL (ref 0.7–1.3)
Gamma Glob SerPl Elph-Mcnc: 1 g/dL (ref 0.4–1.8)
Globulin, Total: 3.1 g/dL (ref 2.2–3.9)
IGG (IMMUNOGLOBIN G), SERUM: 955 mg/dL (ref 700–1600)
IgA: 224 mg/dL (ref 61–437)
IgM, Serum: 179 mg/dL — ABNORMAL HIGH (ref 15–143)
TOTAL PROTEIN ELP: 6.5 g/dL (ref 6.0–8.5)

## 2016-08-05 LAB — PATHOLOGIST SMEAR REVIEW

## 2016-08-05 LAB — FOLATE RBC
FOLATE, HEMOLYSATE: 404 ng/mL
Folate, RBC: 1338 ng/mL (ref >498)
Hematocrit: 30.2 % — ABNORMAL LOW (ref 37.5–51.0)

## 2016-08-05 LAB — KAPPA/LAMBDA LIGHT CHAINS
KAPPA FREE LGHT CHN: 69.3 mg/L — AB (ref 3.3–19.4)
KAPPA, LAMDA LIGHT CHAIN RATIO: 1.57 (ref 0.26–1.65)
LAMDA FREE LIGHT CHAINS: 44.2 mg/L — AB (ref 5.7–26.3)

## 2016-08-05 MED ORDER — B COMPLEX VITAMINS PO CAPS: 1.0000 | ORAL_CAPSULE | Freq: Every day | ORAL | 3 refills | Status: DC

## 2016-08-05 MED ORDER — CYANOCOBALAMIN 1000 MCG SL SUBL: 1000.0000 ug | SUBLINGUAL_TABLET | Freq: Every day | SUBLINGUAL | 3 refills | Status: DC

## 2016-08-05 NOTE — Telephone Encounter (Signed)
Patient returned call. Notified him Dr. Irene Limbo wanted hi to take a B complex vitamin and liquid B12. He can get it at his pharmacy or stores like [facility]. He verbalized understanding.

## 2016-08-05 NOTE — Telephone Encounter (Signed)
-----   Message from Darlina Guys, LPN sent at [phone] 11:28 AM EDT ----- Attempted to call patient at only number listed. Left message for him to call cancer center.  Sharyn Lull ----- Message ----- From: Brunetta Genera, MD Sent: 08/05/2016  12:51 AM To: Onc Nurse Ap  Plz call patient and have him start taking B complex and SL B12. See note for details Thanks, Sullivan Lone MD [place] AAHIVMS [facility] Hematology/Oncology Physician [place]  (Office):       [phone] (Work cell):  [phone] (Fax):           [phone]

## 2016-08-07 ENCOUNTER — Encounter: Payer: Self-pay | Admitting: Gastroenterology

## 2016-08-13 ENCOUNTER — Telehealth: Payer: Self-pay | Admitting: Gastroenterology

## 2016-08-13 NOTE — Telephone Encounter (Signed)
[phone] PATIENT SAW [facility] IN THE HOSPITAL AND HE IS CONFUSED ON WHAT DOCTOR WANTED HIM TO HAVE LABS DRAWN AFTER HE WAS RELEASED.  CAN YOU PLEASE LET HIM KNOW EITHER WAY IF IT WAS OR WAS NOT ORDERED BY [facility] SO HE CAN CONTACT THE OTHER PHYSICIAN BECAUSE THERE IS NO ORDER AT THE LAB IN [place]

## 2016-08-14 NOTE — Telephone Encounter (Signed)
Andrew Zhang, this pt has a hospital follow up with you on 09/18/2016 at 10:30 AM.  The only note of labs that I see is he is to follow up with Oncology in a month for some labs. Am I missing something?

## 2016-08-14 NOTE — Telephone Encounter (Signed)
Reviewed records. Dr. Oneida Alar had mentioned CBC 2 weeks after recent discharge however patient has subsequently had CBC done by hematology/oncology Dr. Irene Limbo on 08/02/2016 with improvement of hemoglobin. According to Dr. Grier Mitts note, plans for repeat labs in 4 weeks with their office.

## 2016-08-19 NOTE — Telephone Encounter (Signed)
PT is aware.

## 2016-08-21 ENCOUNTER — Telehealth: Payer: Self-pay | Admitting: Gastroenterology

## 2016-08-21 NOTE — Telephone Encounter (Signed)
Pt's wife called to say that patient has OV with [place] on 9/20. He is to have labs done the early part of September, but she doesn't know where they need to go. I read her the note in epic that he needed to have labs done in 4 weeks at Dr Grier Mitts office. She doesn't think that's right and had multiple questions about what type of lab was ordered. I told her that I would send a note to nurse and call her back. She agreed. [phone]

## 2016-08-21 NOTE — Telephone Encounter (Signed)
See Andrew Zhang's note of 08/13/2016. I informed pt's wife that he is to follow up with Dr. Grier Mitts office for next blood work.  She said she was just getting everything lined up and wanted to make sure she got it right.

## 2016-08-22 NOTE — Telephone Encounter (Signed)
Thanks, that's correct

## 2016-08-30 ENCOUNTER — Encounter (HOSPITAL_COMMUNITY): Payer: Self-pay | Admitting: Hematology & Oncology

## 2016-08-30 ENCOUNTER — Encounter (HOSPITAL_COMMUNITY): Payer: Medicare Other | Attending: Hematology & Oncology | Admitting: Hematology & Oncology

## 2016-08-30 ENCOUNTER — Encounter (HOSPITAL_COMMUNITY): Payer: Medicare Other

## 2016-08-30 VITALS — BP 137/58 | HR 74 | Temp 98.0°F | Resp 16 | Wt 210.2 lb

## 2016-08-30 DIAGNOSIS — F329 Major depressive disorder, single episode, unspecified: Secondary | ICD-10-CM | POA: Diagnosis not present

## 2016-08-30 DIAGNOSIS — I2510 Atherosclerotic heart disease of native coronary artery without angina pectoris: Secondary | ICD-10-CM | POA: Diagnosis not present

## 2016-08-30 DIAGNOSIS — C61 Malignant neoplasm of prostate: Secondary | ICD-10-CM

## 2016-08-30 DIAGNOSIS — D631 Anemia in chronic kidney disease: Secondary | ICD-10-CM | POA: Diagnosis not present

## 2016-08-30 DIAGNOSIS — Z7984 Long term (current) use of oral hypoglycemic drugs: Secondary | ICD-10-CM | POA: Diagnosis not present

## 2016-08-30 DIAGNOSIS — Z7982 Long term (current) use of aspirin: Secondary | ICD-10-CM | POA: Diagnosis not present

## 2016-08-30 DIAGNOSIS — K5900 Constipation, unspecified: Secondary | ICD-10-CM | POA: Insufficient documentation

## 2016-08-30 DIAGNOSIS — R42 Dizziness and giddiness: Secondary | ICD-10-CM | POA: Diagnosis not present

## 2016-08-30 DIAGNOSIS — E538 Deficiency of other specified B group vitamins: Secondary | ICD-10-CM | POA: Diagnosis not present

## 2016-08-30 DIAGNOSIS — N183 Chronic kidney disease, stage 3 (moderate): Secondary | ICD-10-CM | POA: Insufficient documentation

## 2016-08-30 DIAGNOSIS — Z9049 Acquired absence of other specified parts of digestive tract: Secondary | ICD-10-CM | POA: Insufficient documentation

## 2016-08-30 DIAGNOSIS — K449 Diaphragmatic hernia without obstruction or gangrene: Secondary | ICD-10-CM | POA: Diagnosis not present

## 2016-08-30 DIAGNOSIS — E291 Testicular hypofunction: Secondary | ICD-10-CM | POA: Insufficient documentation

## 2016-08-30 DIAGNOSIS — Z951 Presence of aortocoronary bypass graft: Secondary | ICD-10-CM | POA: Diagnosis not present

## 2016-08-30 DIAGNOSIS — I129 Hypertensive chronic kidney disease with stage 1 through stage 4 chronic kidney disease, or unspecified chronic kidney disease: Secondary | ICD-10-CM | POA: Insufficient documentation

## 2016-08-30 DIAGNOSIS — Z923 Personal history of irradiation: Secondary | ICD-10-CM | POA: Diagnosis not present

## 2016-08-30 DIAGNOSIS — D500 Iron deficiency anemia secondary to blood loss (chronic): Secondary | ICD-10-CM

## 2016-08-30 DIAGNOSIS — Z79899 Other long term (current) drug therapy: Secondary | ICD-10-CM | POA: Diagnosis not present

## 2016-08-30 DIAGNOSIS — K219 Gastro-esophageal reflux disease without esophagitis: Secondary | ICD-10-CM | POA: Insufficient documentation

## 2016-08-30 DIAGNOSIS — Z87891 Personal history of nicotine dependence: Secondary | ICD-10-CM | POA: Diagnosis not present

## 2016-08-30 DIAGNOSIS — Z880 Allergy status to penicillin: Secondary | ICD-10-CM | POA: Diagnosis not present

## 2016-08-30 DIAGNOSIS — Z79818 Long term (current) use of other agents affecting estrogen receptors and estrogen levels: Secondary | ICD-10-CM

## 2016-08-30 DIAGNOSIS — M1990 Unspecified osteoarthritis, unspecified site: Secondary | ICD-10-CM | POA: Diagnosis not present

## 2016-08-30 DIAGNOSIS — D649 Anemia, unspecified: Secondary | ICD-10-CM

## 2016-08-30 DIAGNOSIS — Z9889 Other specified postprocedural states: Secondary | ICD-10-CM | POA: Insufficient documentation

## 2016-08-30 DIAGNOSIS — K921 Melena: Secondary | ICD-10-CM

## 2016-08-30 DIAGNOSIS — D696 Thrombocytopenia, unspecified: Secondary | ICD-10-CM

## 2016-08-30 DIAGNOSIS — I714 Abdominal aortic aneurysm, without rupture: Secondary | ICD-10-CM | POA: Insufficient documentation

## 2016-08-30 LAB — CBC WITH DIFFERENTIAL/PLATELET
Basophils Absolute: 0 10*3/uL (ref 0.0–0.1)
Basophils Relative: 0 %
EOS ABS: 0.1 10*3/uL (ref 0.0–0.7)
Eosinophils Relative: 3 %
HCT: 32.5 % — ABNORMAL LOW (ref 39.0–52.0)
HEMOGLOBIN: 10.5 g/dL — AB (ref 13.0–17.0)
LYMPHS ABS: 1 10*3/uL (ref 0.7–4.0)
Lymphocytes Relative: 23 %
MCH: 29.2 pg (ref 26.0–34.0)
MCHC: 32.3 g/dL (ref 30.0–36.0)
MCV: 90.3 fL (ref 78.0–100.0)
MONOS PCT: 5 %
Monocytes Absolute: 0.2 10*3/uL (ref 0.1–1.0)
NEUTROS PCT: 68 %
Neutro Abs: 2.9 10*3/uL (ref 1.7–7.7)
Platelets: 119 10*3/uL — ABNORMAL LOW (ref 150–400)
RBC: 3.6 MIL/uL — ABNORMAL LOW (ref 4.22–5.81)
RDW: 19.3 % — ABNORMAL HIGH (ref 11.5–15.5)
WBC: 4.2 10*3/uL (ref 4.0–10.5)

## 2016-08-30 NOTE — Patient Instructions (Addendum)
[place] at [facility] Discharge Instructions  RECOMMENDATIONS MADE BY THE CONSULTANT AND ANY TEST RESULTS WILL BE SENT TO YOUR REFERRING PHYSICIAN.  You saw Dr. Whitney Muse today. You will be scheduled for IV iron. Labs 2 weeks after receiving IV iron. Follow up in one month with labs.  Thank you for choosing [place] at [facility] to provide your oncology and hematology care.  To afford each patient quality time with our provider, please arrive at least 15 minutes before your scheduled appointment time.   Beginning January 23rd 2017 lab work for the [facility] will be done in the  Main lab at [facility] on 1st floor. If you have a lab appointment with the [facility] please come in thru the  Main Entrance and check in at the main information desk  You need to re-schedule your appointment should you arrive 10 or more minutes late.  We strive to give you quality time with our providers, and arriving late affects you and other patients whose appointments are after yours.  Also, if you no show three or more times for appointments you may be dismissed from the clinic at the providers discretion.     Again, thank you for choosing [facility].  Our hope is that these requests will decrease the amount of time that you wait before being seen by our physicians.       _____________________________________________________________  Should you have questions after your visit to [facility], please contact our office at [phone] between the hours of 8:30 a.m. and 4:30 p.m.  Voicemails left after 4:30 p.m. will not be returned until the following business day.  For prescription refill requests, have your pharmacy contact our office.         Resources For Cancer Patients and their Caregivers ? American Cancer Society: Can assist with transportation, wigs, general needs, runs Look Good Feel Better.         [phone] ? Cancer Care: Provides financial assistance, online support groups, medication/co-pay assistance.  1-800-813-HOPE 872-254-1078) ? [place] Assists [place] cancer patients and their families through emotional , educational and financial support.  810-555-6634 ? [place] DSS Where to apply for food stamps, Medicaid and utility assistance. 787-269-0754 ? RCATS: Transportation to medical appointments. 9791934310 ? Social Security Administration: May apply for disability if have a Stage IV cancer. 571-406-0495 2025235276 ? [employer], Disability and Transit Services: Assists with nutrition, care and transit needs. [place] Support Programs: @10RELATIVEDAYS @ > Cancer Support Group  2nd Tuesday of the month 1pm-2pm, Journey Room  > Creative Journey  3rd Tuesday of the month 1130am-1pm, Journey Room  > Look Good Feel Better  1st Wednesday of the month 10am-12 noon, Journey Room (Call [place] to register [phone])

## 2016-08-30 NOTE — Progress Notes (Signed)
Marland Kitchen    HEMATOLOGY/ONCOLOGY CONSULTATION NOTE  Date of Service: 08/30/2016  Patient Care Team: [name], MD as PCP - General (Family Medicine) Herminio Commons, MD as Attending Physician (Cardiology)  CHIEF COMPLAINTS/PURPOSE OF CONSULTATION:  Anemia Stage III chronic kidney disease Low B12 Hypogonadism Iron deficiency  HISTORY OF PRESENTING ILLNESS:   Andrew Zhang is a wonderful 82 y.o. male who has been referred to [place] by Dr [name], MD  for evaluation and management of Anemia.  Patient has a history of coronary artery disease status post CABG, hypertension, diabetes, dyslipidemia, abdominal aortic aneurysm status post repair, prostate cancer currently on Lupron shots every 3 months (follows with Dr Willow Ora).  Patient presented to the emergency room on 07/29/2016 with black stools and blood in the stools for one month and presented with fatigue and lightheadedness. He is hemoglobin was down to 5 he received several units of PRBC transfusion. He had an EGD and capsule study which were unremarkable. He reports that he had a positive FOB T about a year ago when he had a colonoscopy for workup. Patient reports that he was taking Aleve for pain 2 times a day for the last 6-12 months and was also on aspirin.  Patient notes that he was diagnosed with prostate cancer about a year ago and treated with radiation therapy and has since been on Lupron shots every 3 months and is being followed by his urologist. PSA on 06/13/2016 was less than 0.1.  He reports that he had B12 deficiency about 20 years ago and was on B12 replacement at that time. He has currently been placed on ferrous sulfate 1 tablet by mouth twice a day for recent blood loss anemia.  His hemoglobin has improved progressively from 5.7 to 6.7 to 7.8 and today up to 9.6.  [name] is accompanied by his wife. I personally reviewed and went over laboratory studies with the patient.   He takes oral iron twice  daily, He does report occasional constipation.  On the 5th he noticed a little blood in his stool, on the 26th he noticed blood in the floor and down his pajama pant leg. By the time he made it to the bathroom, the bleeding had stopped.   He reports he has started feeling dizzy when he goes outside. He comes in and sits down to feel better, then feels chest pain. He saw cardiology after this because he thought he was having a heart attack.  He has been feeling better since he received the blood transfusion.  He denies chest pain or shortness of breath.   MEDICAL HISTORY:  Past Medical History:  Diagnosis Date  . AAA (abdominal aortic aneurysm) ([place])   . Arthritis   . B12 deficiency   . CAD (coronary artery disease)   . Cancer ([place])   . Depression   . Diverticulitis   . DMII (diabetes mellitus, type 2) ([place]) 2008  . GERD (gastroesophageal reflux disease)   . Hiatal hernia   . HTN (hypertension)   . Hyperlipidemia   . Prostate cancer [facility])     SURGICAL HISTORY: Past Surgical History:  Procedure Laterality Date  . APPENDECTOMY    . BACK SURGERY    . CHOLECYSTECTOMY    . COLONOSCOPY     about a year ago (2015/2016 per patient)   . CORONARY ARTERY BYPASS GRAFT    . DESCENDING AORTIC ANEURYSM REPAIR W/ STENT    . ESOPHAGOGASTRODUODENOSCOPY N/A 07/30/2016   Procedure: ESOPHAGOGASTRODUODENOSCOPY (  EGD);  Surgeon: Danie Binder, MD;  Location: [facility];  Service: Endoscopy;  Laterality: N/A;    SOCIAL HISTORY: Social History   Social History  . Marital status: Married    Spouse name: N/A  . Number of children: N/A  . Years of education: N/A   Occupational History  . Not on file.   Social History Main Topics  . Smoking status: Former Smoker    Packs/day: 3.00    Years: 45.00    Types: Cigarettes    Start date: 04/19/1951    Quit date: 03/01/1994  . Smokeless tobacco: Never Used  . Alcohol use 0.0 oz/week     Comment: 1-2 beers daily   . Drug use: No  . Sexual  activity: Not on file     Comment: married 59 years - 5 children    Other Topics Concern  . Not on file   Social History Narrative  . No narrative on file    FAMILY HISTORY: Family History  Problem Relation Age of Onset  . Heart disease    . Diabetes    . Hypertension    . Liver cancer Mother   . Colon cancer Neg Hx     ALLERGIES:  is allergic to penicillins.  MEDICATIONS:  Current Outpatient Prescriptions  Medication Sig Dispense Refill  . aspirin 81 MG chewable tablet Chew 81 mg by mouth daily.    Marland Kitchen b complex vitamins capsule Take 1 capsule by mouth daily. 30 capsule 3  . Calcium Carbonate-Vitamin D (CALTRATE 600+D) 600-400 MG-UNIT per tablet Take 1 tablet by mouth daily.    . Cyanocobalamin 1000 MCG SUBL Place 1 tablet (1,000 mcg total) under the tongue daily. 30 tablet 3  . ferrous sulfate 325 (65 FE) MG tablet Take 1 tablet (325 mg total) by mouth 2 (two) times daily with a meal. 60 tablet 0  . finasteride (PROSCAR) 5 MG tablet Take 5 mg by mouth daily.    Marland Kitchen glimepiride (AMARYL) 2 MG tablet Take 2 mg by mouth daily with breakfast.     . leuprolide (LUPRON) 22.5 MG injection Inject 22.5 mg into the muscle every 3 (three) months.    Marland Kitchen lisinopril (PRINIVIL,ZESTRIL) 2.5 MG tablet Take 2.5 mg by mouth daily.    . metFORMIN (GLUCOPHAGE) 500 MG tablet Take 500 mg by mouth every evening.    . nitroGLYCERIN (NITROSTAT) 0.4 MG SL tablet Place 1 tablet (0.4 mg total) under the tongue every 5 (five) minutes as needed for chest pain. 25 tablet 3  . omeprazole (PRILOSEC) 20 MG capsule Take 1 capsule (20 mg total) by mouth 2 (two) times daily before a meal. 60 capsule 0  . sertraline (ZOLOFT) 100 MG tablet Take 50 mg by mouth daily.    . simvastatin (ZOCOR) 80 MG tablet Take 80 mg by mouth at bedtime.    . tamsulosin (FLOMAX) 0.4 MG CAPS Take 0.4 mg by mouth daily after supper.     No current facility-administered medications for this visit.     REVIEW OF SYSTEMS:   Positive for  constipation. Occasional constipation.  Positive for blood in stool. 14 point review of systems was performed and is negative except as detailed under history of present illness and above   PHYSICAL EXAMINATION: ECOG PERFORMANCE STATUS: 2 - Symptomatic, <50% confined to bed  . Vitals:   08/30/16 1251  BP: (!) 137/58  Pulse: 74  Resp: 16  Temp: 98 F (36.7 C)   Filed Weights  08/30/16 1251  Weight: 210 lb 3.2 oz (95.3 kg)   .Body mass index is 28.51 kg/m.  GENERAL:alert, in no acute distress and comfortable, accompanied by his wife, [name] SKIN: skin color, texture, turgor are normal, no rashes or significant lesions EYES: normal, conjunctiva are pink and non-injected, sclera clear OROPHARYNX:no exudate, no erythema and lips, buccal mucosa, and tongue normal  NECK: supple, no JVD, thyroid normal size, non-tender, without nodularity LYMPH:  no palpable lymphadenopathy in the cervical, axillary or inguinal LUNGS: clear to auscultation with normal respiratory effort HEART: regular rate & rhythm,  no murmurs and no lower extremity edema ABDOMEN: abdomen soft, non-tender, normoactive bowel sounds  Musculoskeletal: no cyanosis of digits and no clubbing  PSYCH: alert & oriented x 3 with fluent speech NEURO: no focal motor/sensory deficits  LABORATORY DATA:  I have reviewed the data as listed  . CBC Latest Ref Rng & Units 08/30/2016 08/02/2016 08/02/2016  WBC 4.0 - 10.5 K/uL 4.2 5.1 -  Hemoglobin 13.0 - 17.0 g/dL 10.5(L) 9.6(L) -  Hematocrit 39.0 - 52.0 % 32.5(L) 30.2(L) 30.8(L)  Platelets 150 - 400 K/uL 119(L) 136(L) -   . CBC    Component Value Date/Time   WBC 4.2 08/30/2016 1136   RBC 3.60 (L) 08/30/2016 1136   HGB 10.5 (L) 08/30/2016 1136   HCT 32.5 (L) 08/30/2016 1136   HCT 30.2 (L) 08/02/2016 1439   PLT 119 (L) 08/30/2016 1136   MCV 90.3 08/30/2016 1136   MCH 29.2 08/30/2016 1136   MCHC 32.3 08/30/2016 1136   RDW 19.3 (H) 08/30/2016 1136   LYMPHSABS 1.0 08/30/2016  1136   MONOABS 0.2 08/30/2016 1136   EOSABS 0.1 08/30/2016 1136   BASOSABS 0.0 08/30/2016 1136    . CMP Latest Ref Rng & Units 08/02/2016 07/31/2016 07/30/2016  Glucose 65 - 99 mg/dL 93 137(H) 109(H)  BUN 6 - 20 mg/dL 29(H) 30(H) 40(H)  Creatinine 0.61 - 1.24 mg/dL 1.81(H) 1.90(H) 1.95(H)  Sodium 135 - 145 mmol/L 138 140 141  Potassium 3.5 - 5.1 mmol/L 4.2 4.4 4.3  Chloride 101 - 111 mmol/L 108 111 111  CO2 22 - 32 mmol/L 26 23 24   Calcium 8.9 - 10.3 mg/dL 8.8(L) 8.4(L) 8.4(L)  Total Protein 6.5 - 8.1 g/dL 7.1 - 5.5(L)  Total Bilirubin 0.3 - 1.2 mg/dL 0.4 - 0.7  Alkaline Phos 38 - 126 U/L 50 - 41  AST 15 - 41 U/L 27 - 19  ALT 17 - 63 U/L 27 - 19       RADIOGRAPHIC STUDIES: I have personally reviewed the radiological images as listed and agreed with the findings in the report. No results found.  ASSESSMENT & PLAN:  Anemia, multifactorial Stage III chronic kidney disease Low B12 Hypogonadism Iron deficiency Lupron use for prostate carcinoma thrombocytopenia  82 year old Caucasian male with   #1 normocytic normochromic anemia. Likely multifactorial.  -Acute drop in hemoglobin leading to her recent hospitalization was from a gastrointestinal bleed. EGD and capsule endoscopy did not show overt source of bleeding. Patient was on NSAIDs which could be a risk factor. -Iron deficiency due to GI bleeding -No evidence of hemolysis based on normal LDH and haptoglobin. 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   In the setting of chronic kidney disease the goal for ferritin would be more than 100 . -Would replace vitamin B12 as sublingual liquid 1000 g daily for low  normal levels . -Vitamin B complex daily  #2 history of prostate cancer diagnosed one year ago treated with radiation therapy -Currently on Lupron every 3 months as per his urologist, his induced hypogonadism contributes to his  anemia  Oral iron is constipating to the patient. Given his problems with rectal bleeding I have encouraged him to discontinue additional oral iron. The patient will be scheduled to receive Feraheme. CBC with diff will be checked 2 weeks post infusion.  If he experiences abnormal bleeding again, he is to contact us and go to the emergency room.  Thrombocytopenia will continue to be followed.   RTC 1 month with CBC and iron studies, along with a B12 check.  All of the patients questions were answered with apparent satisfaction. The patient knows to call the clinic with any problems, questions or concerns.  This document serves as a record of services personally performed by Ancil Linsey, MD. It was created on her behalf by Arlyce Harman, a trained medical scribe. The creation of this record is based on the scribe's personal observations and the provider's statements to them. This document has been checked and approved by the attending provider.  I have reviewed the above documentation for accuracy and completeness and I agree with the above.  Kelby Fam. Verdell Kincannon, MD  08/30/2016 1:09 PM

## 2016-09-08 ENCOUNTER — Encounter (HOSPITAL_COMMUNITY): Payer: Self-pay | Admitting: Hematology & Oncology

## 2016-09-09 ENCOUNTER — Encounter (HOSPITAL_COMMUNITY): Payer: Self-pay

## 2016-09-09 ENCOUNTER — Encounter (HOSPITAL_BASED_OUTPATIENT_CLINIC_OR_DEPARTMENT_OTHER): Payer: Medicare Other

## 2016-09-09 VITALS — BP 150/57 | HR 68 | Temp 97.6°F | Resp 18

## 2016-09-09 DIAGNOSIS — Z23 Encounter for immunization: Secondary | ICD-10-CM

## 2016-09-09 DIAGNOSIS — D509 Iron deficiency anemia, unspecified: Secondary | ICD-10-CM | POA: Diagnosis present

## 2016-09-09 MED ORDER — SODIUM CHLORIDE 0.9 % IV SOLN
510.0000 mg | Freq: Once | INTRAVENOUS | Status: AC
  Administered 2016-09-09: 510 mg via INTRAVENOUS
  Filled 2016-09-09: qty 17

## 2016-09-09 MED ORDER — SODIUM CHLORIDE 0.9 % IV SOLN
Freq: Once | INTRAVENOUS | Status: AC
  Administered 2016-09-09: 14:00:00 via INTRAVENOUS

## 2016-09-09 MED ORDER — INFLUENZA VAC SPLIT QUAD 0.5 ML IM SUSY
0.5000 mL | PREFILLED_SYRINGE | Freq: Once | INTRAMUSCULAR | Status: AC
  Administered 2016-09-09: 0.5 mL via INTRAMUSCULAR

## 2016-09-09 NOTE — Patient Instructions (Signed)
[facility] at [facility] Discharge Instructions  RECOMMENDATIONS MADE BY THE CONSULTANT AND ANY TEST RESULTS WILL BE SENT TO YOUR REFERRING PHYSICIAN.  IV iron given today . Follow up as scheduled.  Thank you for choosing [facility] at [facility] to provide your oncology and hematology care.  To afford each patient quality time with our provider, please arrive at least 15 minutes before your scheduled appointment time.   Beginning January 23rd 2017 lab work for the Cancer Center will be done in the  Main lab at [facility] on 1st floor. If you have a lab appointment with the Cancer Center please come in thru the  Main Entrance and check in at the main information desk  You need to re-schedule your appointment should you arrive 10 or more minutes late.  We strive to give you quality time with our providers, and arriving late affects you and other patients whose appointments are after yours.  Also, if you no show three or more times for appointments you may be dismissed from the clinic at the providers discretion.     Again, thank you for choosing [facility].  Our hope is that these requests will decrease the amount of time that you wait before being seen by our physicians.       _____________________________________________________________  Should you have questions after your visit to [facility], please contact our office at [phone] between the hours of 8:30 a.m. and 4:30 p.m.  Voicemails left after 4:30 p.m. will not be returned until the following business day.  For prescription refill requests, have your pharmacy contact our office.         Resources For Cancer Patients and their Caregivers ? American Cancer Society: Can assist with transportation, wigs, general needs, runs Look Good Feel Better.        1-888-227-6333 ? Cancer Care: Provides financial assistance, online support groups,  medication/co-pay assistance.  1-800-813-HOPE (4673) ? [facility] Assists [place] cancer patients and their families through emotional , educational and financial support.  336-427-4357 ? [place] DSS Where to apply for food stamps, Medicaid and utility assistance. 336-342-1394 ? [facility]: Transportation to medical appointments. 336-347-2287 ? Social Security Administration: May apply for disability if have a Stage IV cancer. 336-342-7796 1-800-772-1213 ? [place] Aging, Disability and Transit Services: Assists with nutrition, care and transit needs. 336-349-2343  Cancer Center Support Programs: @[email or web address]@ > Cancer Support Group  2nd Tuesday of the month 1pm-2pm, Journey Room  > Creative Journey  3rd Tuesday of the month 1130am-1pm, Journey Room  > Look Good Feel Better  1st Wednesday of the month 10am-12 noon, Journey Room (Call American Cancer Society to register 1-800-395-5775)   

## 2016-09-09 NOTE — Progress Notes (Signed)
Patient received IV iron today. Patient tolerated without problems. Vitals stable and discharged ambulatory from clinic.     Andrew Zhang presents today for injection per MD orders. Influenza injection administered IM in right Upper Arm. Administration without incident. Patient tolerated well.

## 2016-09-18 ENCOUNTER — Ambulatory Visit (INDEPENDENT_AMBULATORY_CARE_PROVIDER_SITE_OTHER): Payer: Medicare Other | Admitting: Gastroenterology

## 2016-09-18 ENCOUNTER — Encounter: Payer: Self-pay | Admitting: Gastroenterology

## 2016-09-18 DIAGNOSIS — K922 Gastrointestinal hemorrhage, unspecified: Secondary | ICD-10-CM

## 2016-09-18 DIAGNOSIS — I6523 Occlusion and stenosis of bilateral carotid arteries: Secondary | ICD-10-CM | POA: Diagnosis not present

## 2016-09-18 DIAGNOSIS — K921 Melena: Secondary | ICD-10-CM | POA: Diagnosis not present

## 2016-09-18 HISTORY — DX: Gastrointestinal hemorrhage, unspecified: K92.2

## 2016-09-18 NOTE — Assessment & Plan Note (Signed)
82 year old gentleman with history of multifactorial anemia in the setting of chronic renal insufficiency, Lupron, recent GI bleeding. Patient with improvement of hemoglobin status post iron infusions. He remains on oral iron therapy, stools remain dark to black. He clinically is feeling better. He has good energy level. Notes less discomfort with ambulation since his hemoglobin has improved. No longer on Aleve or other NSAIDs. Obscure GI bleeding suspected to be related to NSAID use possibly NSAID colopathy. Can't exclude radiation-induced although typically would see fresh blood per rectum in that setting.  We will retrieve most recent colonoscopy report, this will be our third attempt. Continue to follow upcoming labs. Likely would not require additional colonoscopy if he's able to maintain his hemoglobin and ferritin and does not develop recurrent GI bleeding. He will continue to avoid all NSAIDs. Further recommendations to follow.

## 2016-09-18 NOTE — Progress Notes (Signed)
Primary Care Physician: [name], MD  Primary Gastroenterologist:  Barney Drain, MD   Chief Complaint  Patient presents with  . Follow-up    HPI: Andrew Zhang is a 82 y.o. male here for follow up of obscure GI bleeding. Patient was hospitalized back in the early part of August he presented with acute blood loss anemia, melena. Outpatient labs were done by cardiology which revealed a hemoglobin of 5.7. His hemoglobin in June had been 8.3 per cardiology patient received several units of blood. He reported black tarry stools. He had been taking Aleve for 4-5 months sometimes twice daily. Had been seen by Dr. Britta Mccreedy. Patient reports had a colonoscopy about a year ago, we have requested the reports times but has not seen one yet. Requested again today.  He has a history of B12 deficiency chronically. He has been on iron orally and recently received his first iron infusion on 08/30/2016. He notes that his stools are dark to black. Regular bowel movements. Denies abdominal pain, nausea or vomiting, heartburn. Appetite is good. During hospitalization he had EGD and capsule endoscopy study without etiology for anemia/GI bleeding. Patient reports that he has improved energy level. He wonders if he was losing blood from his colon, history of prostate cancer status post radiation treatments 42, 2 years out per patient.   Current Outpatient Prescriptions  Medication Sig Dispense Refill  . aspirin 81 MG chewable tablet Chew 81 mg by mouth daily.    Marland Kitchen b complex vitamins capsule Take 1 capsule by mouth daily. 30 capsule 3  . Calcium Carbonate-Vitamin D (CALTRATE 600+D) 600-400 MG-UNIT per tablet Take 1 tablet by mouth daily.    . Cyanocobalamin 1000 MCG SUBL Place 1 tablet (1,000 mcg total) under the tongue daily. 30 tablet 3  . ferrous sulfate 325 (65 FE) MG tablet Take 1 tablet (325 mg total) by mouth 2 (two) times daily with a meal. 60 tablet 0  . finasteride (PROSCAR) 5 MG tablet Take 5  mg by mouth daily.    Marland Kitchen glimepiride (AMARYL) 2 MG tablet Take 2 mg by mouth daily with breakfast.     . leuprolide (LUPRON) 22.5 MG injection Inject 22.5 mg into the muscle every 3 (three) months.    Marland Kitchen lisinopril (PRINIVIL,ZESTRIL) 2.5 MG tablet Take 2.5 mg by mouth daily.    . metFORMIN (GLUCOPHAGE) 500 MG tablet Take 500 mg by mouth every evening.    . nitroGLYCERIN (NITROSTAT) 0.4 MG SL tablet Place 1 tablet (0.4 mg total) under the tongue every 5 (five) minutes as needed for chest pain. 25 tablet 3  . omeprazole (PRILOSEC) 20 MG capsule Take 1 capsule (20 mg total) by mouth 2 (two) times daily before a meal. 60 capsule 0  . sertraline (ZOLOFT) 100 MG tablet Take 50 mg by mouth daily.    . simvastatin (ZOCOR) 80 MG tablet Take 80 mg by mouth at bedtime.    . tamsulosin (FLOMAX) 0.4 MG CAPS Take 0.4 mg by mouth daily after supper.     No current facility-administered medications for this visit.     Allergies as of 09/18/2016 - Review Complete 09/18/2016  Allergen Reaction Noted  . Penicillins Rash 08/27/2013    ROS:  General: Negative for anorexia, weight loss, fever, chills, fatigue, weakness. ENT: Negative for hoarseness, difficulty swallowing , nasal congestion. CV: Negative for chest pain, angina, palpitations, dyspnea on exertion, peripheral edema.  Respiratory: Negative for dyspnea at rest, dyspnea on exertion, cough, sputum,  wheezing.  GI: See history of present illness. GU:  Negative for dysuria, hematuria, urinary incontinence, urinary frequency, nocturnal urination.  Endo: Negative for unusual weight change.    Physical Examination:   BP 138/75   Pulse 72   Temp 97.8 F (36.6 C) (Oral)   Ht 6\' 2"  (1.88 m)   Wt 206 lb (93.4 kg)   BMI 26.45 kg/m   General: Well-nourished, well-developed in no acute distress.  Eyes: No icterus. Mouth: Oropharyngeal mucosa moist and pink , no lesions erythema or exudate. Lungs: Clear to auscultation bilaterally.  Heart: Regular  rate and rhythm, no murmurs rubs or gallops.  Abdomen: Bowel sounds are normal, nontender, nondistended, no hepatosplenomegaly or masses, no abdominal bruits or hernia , no rebound or guarding.   Extremities: No lower extremity edema. No clubbing or deformities. Neuro: Alert and oriented x 4   Skin: Warm and dry, no jaundice.   Psych: Alert and cooperative, normal mood and affect.  Labs:  Lab Results  Component Value Date   WBC 4.2 08/30/2016   HGB 10.5 (L) 08/30/2016   HCT 32.5 (L) 08/30/2016   MCV 90.3 08/30/2016   PLT 119 (L) 08/30/2016   Lab Results  Component Value Date   IRON 182 08/02/2016   TIBC 445 08/02/2016   FERRITIN 33 08/02/2016   Lab Results  Component Value Date   VITAMINB12 211 08/02/2016   Lab Results  Component Value Date   ALT 27 08/02/2016   AST 27 08/02/2016   ALKPHOS 50 08/02/2016   BILITOT 0.4 08/02/2016   Lab Results  Component Value Date   CREATININE 1.81 (H) 08/02/2016   BUN 29 (H) 08/02/2016   NA 138 08/02/2016   K 4.2 08/02/2016   CL 108 08/02/2016   CO2 26 08/02/2016    Imaging Studies: No results found.

## 2016-09-18 NOTE — Patient Instructions (Addendum)
1. Follow up with Dr. Whitney Muse for future labs as planned. I will continue to follow your labs. No plans for repeat colonoscopy unless you cannot maintain your hemoglobin and iron OR you note recurrent bleeding. 2. We will retrieve copy of your colonoscopy from Dr. Britta Mccreedy for review, further recommendations regarding follow to be provided.  3. Please continue to avoid anti-inflammatories like Aleve, Advil, ibuprofen.

## 2016-09-18 NOTE — Progress Notes (Signed)
CC'ED TO PCP 

## 2016-09-19 DIAGNOSIS — C61 Malignant neoplasm of prostate: Secondary | ICD-10-CM | POA: Diagnosis not present

## 2016-09-19 DIAGNOSIS — R7989 Other specified abnormal findings of blood chemistry: Secondary | ICD-10-CM | POA: Diagnosis not present

## 2016-09-19 DIAGNOSIS — D509 Iron deficiency anemia, unspecified: Secondary | ICD-10-CM | POA: Diagnosis not present

## 2016-09-20 ENCOUNTER — Telehealth (HOSPITAL_COMMUNITY): Payer: Self-pay | Admitting: *Deleted

## 2016-09-20 NOTE — Telephone Encounter (Signed)
Called patient wife, instructed her to keep appointments as is until I can check with MD.

## 2016-09-23 ENCOUNTER — Encounter: Payer: Self-pay | Admitting: Gastroenterology

## 2016-09-23 ENCOUNTER — Encounter (HOSPITAL_COMMUNITY): Payer: Medicare Other

## 2016-09-23 DIAGNOSIS — I129 Hypertensive chronic kidney disease with stage 1 through stage 4 chronic kidney disease, or unspecified chronic kidney disease: Secondary | ICD-10-CM | POA: Diagnosis not present

## 2016-09-23 DIAGNOSIS — I714 Abdominal aortic aneurysm, without rupture: Secondary | ICD-10-CM | POA: Diagnosis not present

## 2016-09-23 DIAGNOSIS — D649 Anemia, unspecified: Secondary | ICD-10-CM

## 2016-09-23 DIAGNOSIS — E291 Testicular hypofunction: Secondary | ICD-10-CM | POA: Diagnosis not present

## 2016-09-23 DIAGNOSIS — N183 Chronic kidney disease, stage 3 (moderate): Secondary | ICD-10-CM | POA: Diagnosis not present

## 2016-09-23 DIAGNOSIS — C61 Malignant neoplasm of prostate: Secondary | ICD-10-CM | POA: Diagnosis not present

## 2016-09-23 DIAGNOSIS — D631 Anemia in chronic kidney disease: Secondary | ICD-10-CM | POA: Diagnosis not present

## 2016-09-23 LAB — CBC WITH DIFFERENTIAL/PLATELET
BASOS PCT: 0 %
Basophils Absolute: 0 10*3/uL (ref 0.0–0.1)
EOS ABS: 0.2 10*3/uL (ref 0.0–0.7)
EOS PCT: 4 %
HCT: 32.9 % — ABNORMAL LOW (ref 39.0–52.0)
HEMOGLOBIN: 10.9 g/dL — AB (ref 13.0–17.0)
Lymphocytes Relative: 22 %
Lymphs Abs: 1 10*3/uL (ref 0.7–4.0)
MCH: 30 pg (ref 26.0–34.0)
MCHC: 33.1 g/dL (ref 30.0–36.0)
MCV: 90.6 fL (ref 78.0–100.0)
MONOS PCT: 4 %
Monocytes Absolute: 0.2 10*3/uL (ref 0.1–1.0)
NEUTROS PCT: 70 %
Neutro Abs: 3.2 10*3/uL (ref 1.7–7.7)
PLATELETS: 109 10*3/uL — AB (ref 150–400)
RBC: 3.63 MIL/uL — ABNORMAL LOW (ref 4.22–5.81)
RDW: 17.8 % — ABNORMAL HIGH (ref 11.5–15.5)
WBC: 4.5 10*3/uL (ref 4.0–10.5)

## 2016-09-23 NOTE — Progress Notes (Signed)
Patient know that we received a copy of his colonoscopy done by Dr. Britta Mccreedy dated 04/14/2015.  He had severe diverticulosis in the descending and sigmoid colon, telangiectasias in the rectum from radiation therapy, small flat polyp in the ascending colon which was tubular adenoma.  Recommend labs with Dr. Donald Pore office next month as planned. As long as he is able to maintain his hemoglobin, likely will not require repeat colonoscopy. We will continue to follow.

## 2016-09-23 NOTE — Progress Notes (Signed)
Pt is aware. He had labs today. Said he saw Dr. Whitney Muse and she said don't worry about doing labs next month now, just see them in the office.

## 2016-10-02 ENCOUNTER — Other Ambulatory Visit (HOSPITAL_COMMUNITY): Payer: Medicare Other

## 2016-10-02 ENCOUNTER — Encounter (HOSPITAL_COMMUNITY): Payer: Self-pay | Admitting: Hematology & Oncology

## 2016-10-02 ENCOUNTER — Encounter (HOSPITAL_COMMUNITY): Payer: Medicare Other | Attending: Hematology | Admitting: Hematology & Oncology

## 2016-10-02 ENCOUNTER — Encounter (HOSPITAL_COMMUNITY): Payer: Self-pay | Admitting: Lab

## 2016-10-02 VITALS — BP 118/47 | HR 74 | Temp 98.2°F | Resp 16 | Wt 209.7 lb

## 2016-10-02 DIAGNOSIS — C61 Malignant neoplasm of prostate: Secondary | ICD-10-CM

## 2016-10-02 DIAGNOSIS — D509 Iron deficiency anemia, unspecified: Secondary | ICD-10-CM | POA: Diagnosis not present

## 2016-10-02 DIAGNOSIS — D696 Thrombocytopenia, unspecified: Secondary | ICD-10-CM

## 2016-10-02 DIAGNOSIS — E538 Deficiency of other specified B group vitamins: Secondary | ICD-10-CM | POA: Insufficient documentation

## 2016-10-02 DIAGNOSIS — D649 Anemia, unspecified: Secondary | ICD-10-CM | POA: Diagnosis not present

## 2016-10-02 DIAGNOSIS — K5903 Drug induced constipation: Secondary | ICD-10-CM | POA: Diagnosis not present

## 2016-10-02 LAB — CBC WITH DIFFERENTIAL/PLATELET
BASOS ABS: 0 10*3/uL (ref 0.0–0.1)
BASOS PCT: 0 %
EOS ABS: 0.2 10*3/uL (ref 0.0–0.7)
Eosinophils Relative: 4 %
HEMATOCRIT: 33.9 % — AB (ref 39.0–52.0)
HEMOGLOBIN: 11.5 g/dL — AB (ref 13.0–17.0)
Lymphocytes Relative: 24 %
Lymphs Abs: 1.1 10*3/uL (ref 0.7–4.0)
MCH: 31.2 pg (ref 26.0–34.0)
MCHC: 33.9 g/dL (ref 30.0–36.0)
MCV: 91.9 fL (ref 78.0–100.0)
MONOS PCT: 6 %
Monocytes Absolute: 0.3 10*3/uL (ref 0.1–1.0)
NEUTROS ABS: 3.1 10*3/uL (ref 1.7–7.7)
NEUTROS PCT: 66 %
Platelets: 111 10*3/uL — ABNORMAL LOW (ref 150–400)
RBC: 3.69 MIL/uL — AB (ref 4.22–5.81)
RDW: 17.3 % — ABNORMAL HIGH (ref 11.5–15.5)
WBC: 4.7 10*3/uL (ref 4.0–10.5)

## 2016-10-02 LAB — FERRITIN: FERRITIN: 209 ng/mL (ref 24–336)

## 2016-10-02 LAB — VITAMIN B12: Vitamin B-12: 1445 pg/mL — ABNORMAL HIGH (ref 180–914)

## 2016-10-02 NOTE — Patient Instructions (Addendum)
[place] at [facility] Discharge Instructions  RECOMMENDATIONS MADE BY THE CONSULTANT AND ANY TEST RESULTS WILL BE SENT TO YOUR REFERRING PHYSICIAN.  You saw Dr. Whitney Muse today. Lab work today. Follow up in 2 months with lab work.  Thank you for choosing [place] at [facility] to provide your oncology and hematology care.  To afford each patient quality time with our provider, please arrive at least 15 minutes before your scheduled appointment time.   Beginning January 23rd 2017 lab work for the [facility] will be done in the  Main lab at [facility] on 1st floor. If you have a lab appointment with the [facility] please come in thru the  Main Entrance and check in at the main information desk  You need to re-schedule your appointment should you arrive 10 or more minutes late.  We strive to give you quality time with our providers, and arriving late affects you and other patients whose appointments are after yours.  Also, if you no show three or more times for appointments you may be dismissed from the clinic at the providers discretion.     Again, thank you for choosing [facility].  Our hope is that these requests will decrease the amount of time that you wait before being seen by our physicians.       _____________________________________________________________  Should you have questions after your visit to [facility], please contact our office at [phone] between the hours of 8:30 a.m. and 4:30 p.m.  Voicemails left after 4:30 p.m. will not be returned until the following business day.  For prescription refill requests, have your pharmacy contact our office.         Resources For Cancer Patients and their Caregivers ? American Cancer Society: Can assist with transportation, wigs, general needs, runs Look Good Feel Better.        657-547-0363 ? Cancer Care: Provides financial assistance,  online support groups, medication/co-pay assistance.  1-800-813-HOPE 425-871-4564) ? [place] Assists [place] cancer patients and their families through emotional , educational and financial support.  [phone] ? [place] DSS Where to apply for food stamps, Medicaid and utility assistance. 651-766-6198 ? RCATS: Transportation to medical appointments. (651)486-9131 ? Social Security Administration: May apply for disability if have a Stage IV cancer. (647)577-1466 [phone])[phone] ? [employer], Disability and Transit Services: Assists with nutrition, care and transit needs. [place] Support Programs: @10RELATIVEDAYS @ > Cancer Support Group  2nd Tuesday of the month 1pm-2pm, Journey Room  > Creative Journey  3rd Tuesday of the month 1130am-1pm, Journey Room  > Look Good Feel Better  1st Wednesday of the month 10am-12 noon, Journey Room (Call [place] to register [phone])

## 2016-10-02 NOTE — Progress Notes (Unsigned)
Referral to [facility].  Records faxed on 10/4.  They will call patient with appt

## 2016-10-02 NOTE — Progress Notes (Signed)
HEMATOLOGY/ONCOLOGY PROGRESS NOTE  Date of Service: 10/03/2016  Patient Care Team: [name], MD as PCP - General (Family Medicine) Herminio Commons, MD as Attending Physician (Cardiology) Danie Binder, MD as Consulting Physician (Gastroenterology)  CHIEF COMPLAINTS/PURPOSE OF CONSULTATION:  Anemia Stage III chronic kidney disease Low B12 Hypogonadism Iron deficiency  HISTORY OF PRESENTING ILLNESS:   Andrew Zhang is a wonderful 82 y.o. male who has been referred to [place] by Dr [name], MD for evaluation and management of Anemia.  Patient has a history of coronary artery disease status post CABG, hypertension, diabetes, dyslipidemia, abdominal aortic aneurysm status post repair, prostate cancer currently on Lupron shots every 3 months (follows with Dr Willow Ora).  Patient presented to the emergency room on 07/29/2016 with black stools and blood in the stools for one month and presented with fatigue and lightheadedness. He is hemoglobin was down to 5 he received several units of PRBC transfusion. He had an EGD and capsule study which were unremarkable. He reports that he had a positive FOB T about a year ago when he had a colonoscopy for workup. Patient reports that he was taking Aleve for pain 2 times a day for the last 6-12 months and was also on aspirin.  Patient notes that he was diagnosed with prostate cancer about a year ago and treated with radiation therapy and has since been on Lupron shots every 3 months and is being followed by his urologist. PSA on 06/13/2016 was less than 0.1.  He reports that he had B12 deficiency about 20 years ago and was on B12 replacement at that time. He has currently been placed on ferrous sulfate 1 tablet by mouth twice a day for recent blood loss anemia.  Mr. Hinderer is accompanied by his wife and ambulates with cane. I personally reviewed and went over laboratory studies with the patient.  He just took his last dose of liquid  B12 today. He took his last hormone shot this month.   He has felt pretty good. His appetite is good, "I eat everything I see".   He has not seen anymore bleeding. He checks about every day.   The patient has been trying to get an appointment with Dr. Lowanda Foster but has not been able to.  He has received a flu shot this year.  He did well with his IV iron. He denies any problems afterwards. Energy is improved.   MEDICAL HISTORY:  Past Medical History:  Diagnosis Date  . AAA (abdominal aortic aneurysm) ([place])   . Arthritis   . B12 deficiency   . CAD (coronary artery disease)   . Cancer ([place])   . Depression   . Diverticulitis   . DMII (diabetes mellitus, type 2) ([place]) 2008  . GERD (gastroesophageal reflux disease)   . Hiatal hernia   . HTN (hypertension)   . Hyperlipidemia   . Prostate cancer [facility])     SURGICAL HISTORY: Past Surgical History:  Procedure Laterality Date  . APPENDECTOMY    . BACK SURGERY    . CHOLECYSTECTOMY    . COLONOSCOPY  03/2015   Dr. Britta Mccreedy: Diverticulosis, single tubular adenoma removed.  . CORONARY ARTERY BYPASS GRAFT    . DESCENDING AORTIC ANEURYSM REPAIR W/ STENT    . ESOPHAGOGASTRODUODENOSCOPY N/A 07/30/2016   Dr. Oneida Alar. Nonobstructing Schatzki ring at GE junction, multiple small sessile polyps with no bleeding or stigmata of recent bleeding in the gastric fundus and gastric body. Benign-appearing intrinsic moderate stenosis found the  pylorus status post dilation. Small bowel capsule deployed.  Marland Kitchen GIVENS CAPSULE STUDY     Capsule study is complete to the cecum. No obvious lesions, mass, tumors. Small wisps of blood noted in small bowel secondary to EGD/dilation. Possible few erosions in setting of NSAIDs. No obvious areas of bleeding.    SOCIAL HISTORY: Social History   Social History  . Marital status: Married    Spouse name: N/A  . Number of children: N/A  . Years of education: N/A   Occupational History  . Not on file.   Social History  Main Topics  . Smoking status: Former Smoker    Packs/day: 3.00    Years: 45.00    Types: Cigarettes    Start date: 04/19/1951    Quit date: 03/01/1994  . Smokeless tobacco: Never Used  . Alcohol use 0.0 oz/week     Comment: 1-2 beers daily   . Drug use: No  . Sexual activity: Not on file     Comment: married 59 years - 5 children    Other Topics Concern  . Not on file   Social History Narrative  . No narrative on file    FAMILY HISTORY: Family History  Problem Relation Age of Onset  . Heart disease    . Diabetes    . Hypertension    . Liver cancer Mother   . Colon cancer Neg Hx     ALLERGIES:  is allergic to penicillins.  MEDICATIONS:  Current Outpatient Prescriptions  Medication Sig Dispense Refill  . aspirin 81 MG chewable tablet Chew 81 mg by mouth daily.    Marland Kitchen b complex vitamins capsule Take 1 capsule by mouth daily. 30 capsule 3  . Calcium Carbonate-Vitamin D (CALTRATE 600+D) 600-400 MG-UNIT per tablet Take 1 tablet by mouth daily.    . Cyanocobalamin 1000 MCG SUBL Place 1 tablet (1,000 mcg total) under the tongue daily. 30 tablet 3  . ferrous sulfate 325 (65 FE) MG tablet Take 1 tablet (325 mg total) by mouth 2 (two) times daily with a meal. 60 tablet 0  . finasteride (PROSCAR) 5 MG tablet Take 5 mg by mouth daily.    Marland Kitchen glimepiride (AMARYL) 2 MG tablet Take 2 mg by mouth daily with breakfast.     . leuprolide (LUPRON) 22.5 MG injection Inject 22.5 mg into the muscle every 3 (three) months.    Marland Kitchen lisinopril (PRINIVIL,ZESTRIL) 2.5 MG tablet Take 2.5 mg by mouth daily.    . metFORMIN (GLUCOPHAGE) 500 MG tablet Take 500 mg by mouth every evening.    . nitroGLYCERIN (NITROSTAT) 0.4 MG SL tablet Place 1 tablet (0.4 mg total) under the tongue every 5 (five) minutes as needed for chest pain. 25 tablet 3  . omeprazole (PRILOSEC) 20 MG capsule Take 1 capsule (20 mg total) by mouth 2 (two) times daily before a meal. 60 capsule 0  . sertraline (ZOLOFT) 100 MG tablet Take 50 mg  by mouth daily.    . simvastatin (ZOCOR) 80 MG tablet Take 80 mg by mouth at bedtime.    . tamsulosin (FLOMAX) 0.4 MG CAPS Take 0.4 mg by mouth daily after supper.     No current facility-administered medications for this visit.     REVIEW OF SYSTEMS:   Review of Systems  Constitutional: Negative.   HENT: Negative.   Eyes: Negative.   Respiratory: Negative.   Cardiovascular: Negative.   Gastrointestinal: Negative for blood in stool.       Touch of  blood, "Though not like it was".  Genitourinary: Negative.   Musculoskeletal: Negative.   Skin: Negative.   Neurological: Negative.   Endo/Heme/Allergies: Negative.  Does not bruise/bleed easily.  Psychiatric/Behavioral: Negative.   All other systems reviewed and are negative.   14 point review of systems was performed and is negative except as detailed under history of present illness and above   PHYSICAL EXAMINATION: ECOG PERFORMANCE STATUS: 1 - Symptomatic but completely ambulatory  . Vitals:   10/02/16 1344  BP: (!) 118/47  Pulse: 74  Resp: 16  Temp: 98.2 F (36.8 C)   Filed Weights   10/02/16 1344  Weight: 209 lb 11.2 oz (95.1 kg)   .Body mass index is 26.92 kg/m.  Physical Exam  Constitutional: He is oriented to person, place, and time and well-developed, well-nourished, and in no distress.  accompanied by his wife, [name]  HENT:  Head: Normocephalic and atraumatic.  Mouth/Throat: Oropharynx is clear and moist.  Eyes: Conjunctivae and EOM are normal. Pupils are equal, round, and reactive to light.  Neck: Normal range of motion. Neck supple.  Cardiovascular: Normal rate, regular rhythm and normal heart sounds.   Pulmonary/Chest: Effort normal and breath sounds normal.  Abdominal: Soft. Bowel sounds are normal.  Musculoskeletal: Normal range of motion.  Neurological: He is alert and oriented to person, place, and time. Gait normal.  Skin: Skin is warm and dry.  Nursing note and vitals reviewed.   LABORATORY  DATA:  I have reviewed the data as listed  . CBC Latest Ref Rng & Units 10/02/2016 09/23/2016 08/30/2016  WBC 4.0 - 10.5 K/uL 4.7 4.5 4.2  Hemoglobin 13.0 - 17.0 g/dL 11.5(L) 10.9(L) 10.5(L)  Hematocrit 39.0 - 52.0 % 33.9(L) 32.9(L) 32.5(L)  Platelets 150 - 400 K/uL 111(L) 109(L) 119(L)   . CBC    Component Value Date/Time   WBC 4.7 10/02/2016 1433   RBC 3.69 (L) 10/02/2016 1433   HGB 11.5 (L) 10/02/2016 1433   HCT 33.9 (L) 10/02/2016 1433   HCT 30.2 (L) 08/02/2016 1439   PLT 111 (L) 10/02/2016 1433   MCV 91.9 10/02/2016 1433   MCH 31.2 10/02/2016 1433   MCHC 33.9 10/02/2016 1433   RDW 17.3 (H) 10/02/2016 1433   LYMPHSABS 1.1 10/02/2016 1433   MONOABS 0.3 10/02/2016 1433   EOSABS 0.2 10/02/2016 1433   BASOSABS 0.0 10/02/2016 1433    . CMP Latest Ref Rng & Units 08/02/2016 07/31/2016 07/30/2016  Glucose 65 - 99 mg/dL 93 137(H) 109(H)  BUN 6 - 20 mg/dL 29(H) 30(H) 40(H)  Creatinine 0.61 - 1.24 mg/dL 1.81(H) 1.90(H) 1.95(H)  Sodium 135 - 145 mmol/L 138 140 141  Potassium 3.5 - 5.1 mmol/L 4.2 4.4 4.3  Chloride 101 - 111 mmol/L 108 111 111  CO2 22 - 32 mmol/L 26 23 24   Calcium 8.9 - 10.3 mg/dL 8.8(L) 8.4(L) 8.4(L)  Total Protein 6.5 - 8.1 g/dL 7.1 - 5.5(L)  Total Bilirubin 0.3 - 1.2 mg/dL 0.4 - 0.7  Alkaline Phos 38 - 126 U/L 50 - 41  AST 15 - 41 U/L 27 - 19  ALT 17 - 63 U/L 27 - 19       RADIOGRAPHIC STUDIES: I have personally reviewed the radiological images as listed and agreed with the findings in the report. Study Result   CLINICAL DATA:  82 year old male with weakness, dizziness, shortness of breath for 3 weeks. Anemia. Former smoker. Initial encounter.  EXAM: CHEST  2 VIEW  COMPARISON:  [facility] chest  radiographs 06/26/2016 and earlier.  CT Abdomen and Pelvis 06/26/2016  FINDINGS: Stable mediastinal contours. Mild cardiomegaly. Sequelae of CABG. Visualized tracheal air column is within normal limits. Partially visible abdominal aortic  endograft. Stable lung volumes. No pneumothorax, pulmonary edema, pleural effusion or acute pulmonary opacity. Calcified thoracic aortic atherosclerosis. No acute osseous abnormality identified. Stable cholecystectomy clips.  IMPRESSION: 1.  No acute cardiopulmonary abnormality. 2. Calcified aortic atherosclerosis.   Electronically Signed   By: Genevie Ann M.D.   On: 07/29/2016 16:19     ASSESSMENT & PLAN:  Anemia, multifactorial Stage III chronic kidney disease Low B12 Hypogonadism Iron deficiency Lupron use for prostate carcinoma thrombocytopenia  82 year old Caucasian male with   #1 normocytic normochromic anemia. Likely multifactorial.  -Acute drop in hemoglobin leading to her recent hospitalization was from a gastrointestinal bleed. EGD and capsule endoscopy did not show overt source of bleeding. Patient was on NSAIDs which could be a risk factor. -Iron deficiency due to GI bleeding -No evidence of hemolysis based on normal LDH and haptoglobin. 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   In the setting of chronic kidney disease the goal for ferritin would be more than 100 . -Would replace vitamin B12 as sublingual liquid 1000 g daily for low normal levels . -Vitamin B complex daily  #2 history of prostate cancer diagnosed one year ago treated with radiation therapy -Currently on Lupron every 3 months as per his urologist, his induced hypogonadism contributes to his anemia  Oral iron is constipating to the patient. Given his problems with rectal bleeding I have encouraged him to discontinue additional oral iron. He has received one dose of feraheme. Labs from today are currently available and reviewed H/H are markedly improved at 11.5/33.9. Will await on his ferritin and advise the patient if additional oral iron is needed.  If he experiences GI bleeding again, he is to contact  us and go to the emergency room.  Thrombocytopenia will continue to be followed.   RTC 1 month with CBC and iron studies, along with a B12 check.  Will notify the patient of B12 levels when available as well. If he needs IM B12 he would like to arrange this at the [facility] if possible.   I have referred the patient to Dr. Lowanda Foster, at the patient's request.  He is scheduled to follow up with Dr. Bronson Ing on 11/11/2016.  He will return for follow up in 2 to 3 months.   All of the patients questions were answered with apparent satisfaction. The patient knows to call the clinic with any problems, questions or concerns.  This document serves as a record of services personally performed by Ancil Linsey, MD. It was created on her behalf by Arlyce Harman, a trained medical scribe. The creation of this record is based on the scribe's personal observations and the provider's statements to them. This document has been checked and approved by the attending provider.  I have reviewed the above documentation for accuracy and completeness and I agree with the above.  Kelby Fam. Penland, MD  10/03/2016 7:26 PM

## 2016-10-03 ENCOUNTER — Encounter (HOSPITAL_COMMUNITY): Payer: Self-pay | Admitting: Hematology & Oncology

## 2016-10-04 ENCOUNTER — Telehealth (HOSPITAL_COMMUNITY): Payer: Self-pay | Admitting: *Deleted

## 2016-10-04 ENCOUNTER — Telehealth (HOSPITAL_COMMUNITY): Payer: Self-pay | Admitting: Emergency Medicine

## 2016-10-04 NOTE — Telephone Encounter (Signed)
Notified pt and wife about lab results

## 2016-10-31 ENCOUNTER — Telehealth (HOSPITAL_COMMUNITY): Payer: Self-pay | Admitting: *Deleted

## 2016-10-31 DIAGNOSIS — Z951 Presence of aortocoronary bypass graft: Secondary | ICD-10-CM | POA: Diagnosis not present

## 2016-10-31 DIAGNOSIS — I10 Essential (primary) hypertension: Secondary | ICD-10-CM | POA: Diagnosis not present

## 2016-10-31 DIAGNOSIS — Z87891 Personal history of nicotine dependence: Secondary | ICD-10-CM | POA: Diagnosis not present

## 2016-10-31 DIAGNOSIS — Z7982 Long term (current) use of aspirin: Secondary | ICD-10-CM | POA: Diagnosis not present

## 2016-10-31 DIAGNOSIS — N390 Urinary tract infection, site not specified: Secondary | ICD-10-CM | POA: Diagnosis not present

## 2016-10-31 DIAGNOSIS — Z7984 Long term (current) use of oral hypoglycemic drugs: Secondary | ICD-10-CM | POA: Diagnosis not present

## 2016-10-31 DIAGNOSIS — R109 Unspecified abdominal pain: Secondary | ICD-10-CM | POA: Diagnosis not present

## 2016-10-31 DIAGNOSIS — Z79899 Other long term (current) drug therapy: Secondary | ICD-10-CM | POA: Diagnosis not present

## 2016-10-31 DIAGNOSIS — N200 Calculus of kidney: Secondary | ICD-10-CM | POA: Diagnosis not present

## 2016-10-31 DIAGNOSIS — N289 Disorder of kidney and ureter, unspecified: Secondary | ICD-10-CM | POA: Diagnosis not present

## 2016-10-31 DIAGNOSIS — K219 Gastro-esophageal reflux disease without esophagitis: Secondary | ICD-10-CM | POA: Diagnosis not present

## 2016-10-31 DIAGNOSIS — E7800 Pure hypercholesterolemia, unspecified: Secondary | ICD-10-CM | POA: Diagnosis not present

## 2016-10-31 DIAGNOSIS — E119 Type 2 diabetes mellitus without complications: Secondary | ICD-10-CM | POA: Diagnosis not present

## 2016-10-31 DIAGNOSIS — Z923 Personal history of irradiation: Secondary | ICD-10-CM | POA: Diagnosis not present

## 2016-10-31 DIAGNOSIS — Z8249 Family history of ischemic heart disease and other diseases of the circulatory system: Secondary | ICD-10-CM | POA: Diagnosis not present

## 2016-10-31 DIAGNOSIS — Z8546 Personal history of malignant neoplasm of prostate: Secondary | ICD-10-CM | POA: Diagnosis not present

## 2016-10-31 DIAGNOSIS — R319 Hematuria, unspecified: Secondary | ICD-10-CM | POA: Diagnosis not present

## 2016-10-31 DIAGNOSIS — Z833 Family history of diabetes mellitus: Secondary | ICD-10-CM | POA: Diagnosis not present

## 2016-10-31 DIAGNOSIS — R911 Solitary pulmonary nodule: Secondary | ICD-10-CM | POA: Diagnosis not present

## 2016-10-31 DIAGNOSIS — N132 Hydronephrosis with renal and ureteral calculous obstruction: Secondary | ICD-10-CM | POA: Diagnosis not present

## 2016-11-01 DIAGNOSIS — N201 Calculus of ureter: Secondary | ICD-10-CM | POA: Diagnosis not present

## 2016-11-01 DIAGNOSIS — M1990 Unspecified osteoarthritis, unspecified site: Secondary | ICD-10-CM | POA: Diagnosis not present

## 2016-11-01 DIAGNOSIS — Z8546 Personal history of malignant neoplasm of prostate: Secondary | ICD-10-CM | POA: Diagnosis not present

## 2016-11-01 DIAGNOSIS — E119 Type 2 diabetes mellitus without complications: Secondary | ICD-10-CM | POA: Diagnosis not present

## 2016-11-01 DIAGNOSIS — Z880 Allergy status to penicillin: Secondary | ICD-10-CM | POA: Diagnosis not present

## 2016-11-01 DIAGNOSIS — R7989 Other specified abnormal findings of blood chemistry: Secondary | ICD-10-CM | POA: Diagnosis not present

## 2016-11-01 DIAGNOSIS — K219 Gastro-esophageal reflux disease without esophagitis: Secondary | ICD-10-CM | POA: Diagnosis not present

## 2016-11-01 DIAGNOSIS — Z79899 Other long term (current) drug therapy: Secondary | ICD-10-CM | POA: Diagnosis not present

## 2016-11-01 DIAGNOSIS — Z881 Allergy status to other antibiotic agents status: Secondary | ICD-10-CM | POA: Diagnosis not present

## 2016-11-01 DIAGNOSIS — N132 Hydronephrosis with renal and ureteral calculous obstruction: Secondary | ICD-10-CM | POA: Diagnosis not present

## 2016-11-01 DIAGNOSIS — I2510 Atherosclerotic heart disease of native coronary artery without angina pectoris: Secondary | ICD-10-CM | POA: Diagnosis not present

## 2016-11-01 DIAGNOSIS — E785 Hyperlipidemia, unspecified: Secondary | ICD-10-CM | POA: Diagnosis not present

## 2016-11-01 DIAGNOSIS — I10 Essential (primary) hypertension: Secondary | ICD-10-CM | POA: Diagnosis not present

## 2016-11-01 DIAGNOSIS — R319 Hematuria, unspecified: Secondary | ICD-10-CM | POA: Diagnosis not present

## 2016-11-01 DIAGNOSIS — Z7982 Long term (current) use of aspirin: Secondary | ICD-10-CM | POA: Diagnosis not present

## 2016-11-01 DIAGNOSIS — Z7984 Long term (current) use of oral hypoglycemic drugs: Secondary | ICD-10-CM | POA: Diagnosis not present

## 2016-11-01 DIAGNOSIS — F329 Major depressive disorder, single episode, unspecified: Secondary | ICD-10-CM | POA: Diagnosis not present

## 2016-11-04 DIAGNOSIS — H353121 Nonexudative age-related macular degeneration, left eye, early dry stage: Secondary | ICD-10-CM | POA: Diagnosis not present

## 2016-11-04 DIAGNOSIS — H2511 Age-related nuclear cataract, right eye: Secondary | ICD-10-CM | POA: Diagnosis not present

## 2016-11-04 DIAGNOSIS — E119 Type 2 diabetes mellitus without complications: Secondary | ICD-10-CM | POA: Diagnosis not present

## 2016-11-06 DIAGNOSIS — N200 Calculus of kidney: Secondary | ICD-10-CM | POA: Diagnosis not present

## 2016-11-06 DIAGNOSIS — R300 Dysuria: Secondary | ICD-10-CM | POA: Diagnosis not present

## 2016-11-06 DIAGNOSIS — N184 Chronic kidney disease, stage 4 (severe): Secondary | ICD-10-CM | POA: Diagnosis not present

## 2016-11-06 DIAGNOSIS — N390 Urinary tract infection, site not specified: Secondary | ICD-10-CM | POA: Diagnosis not present

## 2016-11-06 DIAGNOSIS — I10 Essential (primary) hypertension: Secondary | ICD-10-CM | POA: Diagnosis not present

## 2016-11-11 ENCOUNTER — Ambulatory Visit: Payer: Medicare Other | Admitting: Cardiovascular Disease

## 2016-12-02 ENCOUNTER — Encounter (HOSPITAL_COMMUNITY): Payer: Medicare Other

## 2016-12-02 ENCOUNTER — Encounter (HOSPITAL_COMMUNITY): Payer: Self-pay | Admitting: Hematology & Oncology

## 2016-12-02 ENCOUNTER — Encounter (HOSPITAL_COMMUNITY): Payer: Medicare Other | Attending: Hematology & Oncology | Admitting: Hematology & Oncology

## 2016-12-02 VITALS — BP 120/67 | HR 76 | Temp 97.7°F | Resp 16 | Ht 74.0 in | Wt 206.0 lb

## 2016-12-02 DIAGNOSIS — N183 Chronic kidney disease, stage 3 (moderate): Secondary | ICD-10-CM | POA: Diagnosis not present

## 2016-12-02 DIAGNOSIS — E785 Hyperlipidemia, unspecified: Secondary | ICD-10-CM | POA: Insufficient documentation

## 2016-12-02 DIAGNOSIS — Z951 Presence of aortocoronary bypass graft: Secondary | ICD-10-CM | POA: Insufficient documentation

## 2016-12-02 DIAGNOSIS — D696 Thrombocytopenia, unspecified: Secondary | ICD-10-CM

## 2016-12-02 DIAGNOSIS — C61 Malignant neoplasm of prostate: Secondary | ICD-10-CM | POA: Diagnosis not present

## 2016-12-02 DIAGNOSIS — I714 Abdominal aortic aneurysm, without rupture: Secondary | ICD-10-CM | POA: Insufficient documentation

## 2016-12-02 DIAGNOSIS — D631 Anemia in chronic kidney disease: Secondary | ICD-10-CM | POA: Insufficient documentation

## 2016-12-02 DIAGNOSIS — N200 Calculus of kidney: Secondary | ICD-10-CM | POA: Diagnosis not present

## 2016-12-02 DIAGNOSIS — Z87891 Personal history of nicotine dependence: Secondary | ICD-10-CM | POA: Insufficient documentation

## 2016-12-02 DIAGNOSIS — K921 Melena: Secondary | ICD-10-CM

## 2016-12-02 DIAGNOSIS — E538 Deficiency of other specified B group vitamins: Secondary | ICD-10-CM | POA: Diagnosis not present

## 2016-12-02 DIAGNOSIS — I129 Hypertensive chronic kidney disease with stage 1 through stage 4 chronic kidney disease, or unspecified chronic kidney disease: Secondary | ICD-10-CM | POA: Insufficient documentation

## 2016-12-02 DIAGNOSIS — Z7982 Long term (current) use of aspirin: Secondary | ICD-10-CM | POA: Insufficient documentation

## 2016-12-02 DIAGNOSIS — D509 Iron deficiency anemia, unspecified: Secondary | ICD-10-CM

## 2016-12-02 DIAGNOSIS — Z7984 Long term (current) use of oral hypoglycemic drugs: Secondary | ICD-10-CM | POA: Insufficient documentation

## 2016-12-02 DIAGNOSIS — Z79899 Other long term (current) drug therapy: Secondary | ICD-10-CM | POA: Insufficient documentation

## 2016-12-02 DIAGNOSIS — Z79818 Long term (current) use of other agents affecting estrogen receptors and estrogen levels: Secondary | ICD-10-CM | POA: Diagnosis not present

## 2016-12-02 DIAGNOSIS — R319 Hematuria, unspecified: Secondary | ICD-10-CM

## 2016-12-02 DIAGNOSIS — I2510 Atherosclerotic heart disease of native coronary artery without angina pectoris: Secondary | ICD-10-CM | POA: Insufficient documentation

## 2016-12-02 LAB — CBC WITH DIFFERENTIAL/PLATELET
Basophils Absolute: 0 10*3/uL (ref 0.0–0.1)
Basophils Relative: 0 %
EOS ABS: 0.2 10*3/uL (ref 0.0–0.7)
EOS PCT: 3 %
HCT: 26.5 % — ABNORMAL LOW (ref 39.0–52.0)
Hemoglobin: 9 g/dL — ABNORMAL LOW (ref 13.0–17.0)
LYMPHS ABS: 1.1 10*3/uL (ref 0.7–4.0)
Lymphocytes Relative: 22 %
MCH: 32.5 pg (ref 26.0–34.0)
MCHC: 34 g/dL (ref 30.0–36.0)
MCV: 95.7 fL (ref 78.0–100.0)
MONOS PCT: 7 %
Monocytes Absolute: 0.4 10*3/uL (ref 0.1–1.0)
Neutro Abs: 3.4 10*3/uL (ref 1.7–7.7)
Neutrophils Relative %: 68 %
PLATELETS: 123 10*3/uL — AB (ref 150–400)
RBC: 2.77 MIL/uL — ABNORMAL LOW (ref 4.22–5.81)
RDW: 14 % (ref 11.5–15.5)
WBC: 5.1 10*3/uL (ref 4.0–10.5)

## 2016-12-02 LAB — COMPREHENSIVE METABOLIC PANEL
ALT: 30 U/L (ref 17–63)
ANION GAP: 7 (ref 5–15)
AST: 31 U/L (ref 15–41)
Albumin: 3.5 g/dL (ref 3.5–5.0)
Alkaline Phosphatase: 46 U/L (ref 38–126)
BUN: 51 mg/dL — ABNORMAL HIGH (ref 6–20)
CALCIUM: 9.2 mg/dL (ref 8.9–10.3)
CHLORIDE: 110 mmol/L (ref 101–111)
CO2: 22 mmol/L (ref 22–32)
Creatinine, Ser: 2.3 mg/dL — ABNORMAL HIGH (ref 0.61–1.24)
GFR calc non Af Amer: 25 mL/min — ABNORMAL LOW (ref >60)
GFR, EST AFRICAN AMERICAN: 29 mL/min — AB (ref >60)
Glucose, Bld: 138 mg/dL — ABNORMAL HIGH (ref 65–99)
Potassium: 4.7 mmol/L (ref 3.5–5.1)
SODIUM: 139 mmol/L (ref 135–145)
Total Bilirubin: 0.4 mg/dL (ref 0.3–1.2)
Total Protein: 6.7 g/dL (ref 6.5–8.1)

## 2016-12-02 LAB — IRON AND TIBC
IRON: 76 ug/dL (ref 45–182)
Saturation Ratios: 23 % (ref 17.9–39.5)
TIBC: 325 ug/dL (ref 250–450)
UIBC: 249 ug/dL

## 2016-12-02 LAB — FERRITIN: Ferritin: 43 ng/mL (ref 24–336)

## 2016-12-02 NOTE — Patient Instructions (Signed)
[place] at [facility] Discharge Instructions  RECOMMENDATIONS MADE BY THE CONSULTANT AND ANY TEST RESULTS WILL BE SENT TO YOUR REFERRING PHYSICIAN.  You were seen today by Dr. Whitney Muse. Will call with iron results. Blood transfusion this week. Labs every 4 weeks. Return to clinic in 2 months for follow up and B12.  Thank you for choosing [place] at [facility] to provide your oncology and hematology care.  To afford each patient quality time with our provider, please arrive at least 15 minutes before your scheduled appointment time.   Beginning January 23rd 2017 lab work for the [facility] will be done in the  Main lab at [facility] on 1st floor. If you have a lab appointment with the [place] please come in thru the  Main Entrance and check in at the main information desk  You need to re-schedule your appointment should you arrive 10 or more minutes late.  We strive to give you quality time with our providers, and arriving late affects you and other patients whose appointments are after yours.  Also, if you no show three or more times for appointments you may be dismissed from the clinic at the providers discretion.     Again, thank you for choosing [facility].  Our hope is that these requests will decrease the amount of time that you wait before being seen by our physicians.       _____________________________________________________________  Should you have questions after your visit to [facility], please contact our office at [phone] between the hours of 8:30 a.m. and 4:30 p.m.  Voicemails left after 4:30 p.m. will not be returned until the following business day.  For prescription refill requests, have your pharmacy contact our office.         Resources For Cancer Patients and their Caregivers ? American Cancer Society: Can assist with transportation, wigs, general needs, runs Look Good  Feel Better.        405-750-6703 ? Cancer Care: Provides financial assistance, online support groups, medication/co-pay assistance.  1-800-813-HOPE 415-718-8516) ? [place] Assists [place] cancer patients and their families through emotional , educational and financial support.  716-482-3871 ? [place] DSS Where to apply for food stamps, Medicaid and utility assistance. 719-616-8766 ? RCATS: Transportation to medical appointments. (318) 132-1392 ? Social Security Administration: May apply for disability if have a Stage IV cancer. (902)740-6269 [phone])489-1799 ? [employer], Disability and Transit Services: Assists with nutrition, care and transit needs. [place] Support Programs: @10RELATIVEDAYS @ > Cancer Support Group  2nd Tuesday of the month 1pm-2pm, Journey Room  > Creative Journey  3rd Tuesday of the month 1130am-1pm, Journey Room  > Look Good Feel Better  1st Wednesday of the month 10am-12 noon, Journey Room (Call [place] to register [phone])

## 2016-12-02 NOTE — Progress Notes (Signed)
HEMATOLOGY/ONCOLOGY PROGRESS NOTE  Date of Service: 12/02/2016  Patient Care Team: [name], MD as PCP - General (Family Medicine) Herminio Commons, MD as Attending Physician (Cardiology) Danie Binder, MD as Consulting Physician (Gastroenterology)  CHIEF COMPLAINTS/PURPOSE OF CONSULTATION:  Anemia Stage III chronic kidney disease Low B12 Hypogonadism Iron deficiency  HISTORY OF PRESENTING ILLNESS:   Andrew Zhang is a wonderful 82 y.o. male who has been referred to [place] by Dr [name], MD for evaluation and management of Anemia.  Patient has a history of coronary artery disease status post CABG, hypertension, diabetes, dyslipidemia, abdominal aortic aneurysm status post repair, prostate cancer currently on Lupron shots every 3 months (follows with Dr Willow Ora).  [name] is accompanied by his wife. I personally reviewed and went over laboratory studies with the patient.  He had a kidney stone with associated pain in his left flank and hematuria. He has not had hematuria since. He notes that his hematuria "was significant."  He reports fatigue and that his legs wobble. His wife agrees he seems tired and lays around a lot. He believes a blood transfusion would help him.  He is doing a 24 hour urine collection then he will undergo an ultrasound.   His wife notes he moves his bowels frequently - sometimes 3 to 4 times daily with diarrhea. He denies abdominal pain.   States he has been eating pretty good. He takes liquid B12, once a week.  He does not wear socks unless it is really cold as his feet swell when he wears socks.  He has received a flu shot this year.   MEDICAL HISTORY:  Past Medical History:  Diagnosis Date  . AAA (abdominal aortic aneurysm) ([place])   . Arthritis   . B12 deficiency   . CAD (coronary artery disease)   . Cancer ([place])   . Depression   . Diverticulitis   . DMII (diabetes mellitus, type 2) ([place]) 2008  . GERD (gastroesophageal  reflux disease)   . Hiatal hernia   . HTN (hypertension)   . Hyperlipidemia   . Prostate cancer [facility])     SURGICAL HISTORY: Past Surgical History:  Procedure Laterality Date  . APPENDECTOMY    . BACK SURGERY    . CHOLECYSTECTOMY    . COLONOSCOPY  03/2015   Dr. Britta Mccreedy: Diverticulosis, single tubular adenoma removed.  . CORONARY ARTERY BYPASS GRAFT    . DESCENDING AORTIC ANEURYSM REPAIR W/ STENT    . ESOPHAGOGASTRODUODENOSCOPY N/A 07/30/2016   Dr. Oneida Alar. Nonobstructing Schatzki ring at GE junction, multiple small sessile polyps with no bleeding or stigmata of recent bleeding in the gastric fundus and gastric body. Benign-appearing intrinsic moderate stenosis found the pylorus status post dilation. Small bowel capsule deployed.  Marland Kitchen GIVENS CAPSULE STUDY     Capsule study is complete to the cecum. No obvious lesions, mass, tumors. Small wisps of blood noted in small bowel secondary to EGD/dilation. Possible few erosions in setting of NSAIDs. No obvious areas of bleeding.    SOCIAL HISTORY: Social History   Social History  . Marital status: Married    Spouse name: N/A  . Number of children: N/A  . Years of education: N/A   Occupational History  . Not on file.   Social History Main Topics  . Smoking status: Former Smoker    Packs/day: 3.00    Years: 45.00    Types: Cigarettes    Start date: 04/19/1951    Quit date: 03/01/1994  .  Smokeless tobacco: Never Used  . Alcohol use 0.0 oz/week     Comment: 1-2 beers daily   . Drug use: No  . Sexual activity: Not on file     Comment: married 59 years - 5 children    Other Topics Concern  . Not on file   Social History Narrative  . No narrative on file    FAMILY HISTORY: Family History  Problem Relation Age of Onset  . Heart disease    . Diabetes    . Hypertension    . Liver cancer Mother   . Colon cancer Neg Hx     ALLERGIES:  is allergic to penicillins.  MEDICATIONS:  Current Outpatient Prescriptions  Medication Sig  Dispense Refill  . aspirin 81 MG chewable tablet Chew 81 mg by mouth daily.    . Calcium Carbonate-Vitamin D (CALTRATE 600+D) 600-400 MG-UNIT per tablet Take 1 tablet by mouth daily.    . Cyanocobalamin 1000 MCG SUBL Place 1 tablet (1,000 mcg total) under the tongue daily. 30 tablet 3  . ferrous sulfate 325 (65 FE) MG tablet Take 1 tablet (325 mg total) by mouth 2 (two) times daily with a meal. 60 tablet 0  . finasteride (PROSCAR) 5 MG tablet Take 5 mg by mouth daily.    Marland Kitchen glimepiride (AMARYL) 2 MG tablet Take 2 mg by mouth daily with breakfast.     . lisinopril (PRINIVIL,ZESTRIL) 2.5 MG tablet Take 2.5 mg by mouth daily.    . metFORMIN (GLUCOPHAGE) 500 MG tablet Take 500 mg by mouth every evening.    . nitroGLYCERIN (NITROSTAT) 0.4 MG SL tablet Place 1 tablet (0.4 mg total) under the tongue every 5 (five) minutes as needed for chest pain. 25 tablet 3  . omeprazole (PRILOSEC) 20 MG capsule Take 1 capsule (20 mg total) by mouth 2 (two) times daily before a meal. 60 capsule 0  . sertraline (ZOLOFT) 100 MG tablet Take 50 mg by mouth daily.    . simvastatin (ZOCOR) 80 MG tablet Take 80 mg by mouth at bedtime.    . tamsulosin (FLOMAX) 0.4 MG CAPS Take 0.4 mg by mouth daily after supper.     No current facility-administered medications for this visit.     REVIEW OF SYSTEMS:   Review of Systems  Constitutional: Negative.   HENT: Negative.   Eyes: Negative.   Respiratory: Negative.   Cardiovascular: Negative.   Gastrointestinal: Positive for diarrhea. Negative for blood in stool.       Bowel frequency  Genitourinary: Negative.   Musculoskeletal: Negative.   Skin: Negative.   Neurological: Negative.   Endo/Heme/Allergies: Negative.  Does not bruise/bleed easily.  Psychiatric/Behavioral: Negative.   All other systems reviewed and are negative.  14 point review of systems was performed and is negative except as detailed under history of present illness and above   PHYSICAL  EXAMINATION: ECOG PERFORMANCE STATUS: 1 - Symptomatic but completely ambulatory  Vitals:   12/02/16 1344  BP: 120/67  Pulse: 76  Resp: 16  Temp: 97.7 F (36.5 C)   Filed Weights   12/02/16 1344  Weight: 206 lb (93.4 kg)   .Body mass index is 26.45 kg/m.  Physical Exam  Constitutional: He is oriented to person, place, and time and well-developed, well-nourished, and in no distress.  accompanied by his wife, [name]  HENT:  Head: Normocephalic and atraumatic.  Mouth/Throat: Oropharynx is clear and moist.  Eyes: Conjunctivae and EOM are normal. Pupils are equal, round, and reactive to  light.  Neck: Normal range of motion. Neck supple.  Cardiovascular: Normal rate, regular rhythm and normal heart sounds.   Pulmonary/Chest: Effort normal and breath sounds normal.  Abdominal: Soft. Bowel sounds are normal.  Musculoskeletal: Normal range of motion.  Neurological: He is alert and oriented to person, place, and time. Gait normal.  Skin: Skin is warm and dry.  Nursing note and vitals reviewed.   LABORATORY DATA:  I have reviewed the data as listed  CBC Latest Ref Rng & Units 12/02/2016 10/02/2016 09/23/2016  WBC 4.0 - 10.5 K/uL 5.1 4.7 4.5  Hemoglobin 13.0 - 17.0 g/dL 9.0(L) 11.5(L) 10.9(L)  Hematocrit 39.0 - 52.0 % 26.5(L) 33.9(L) 32.9(L)  Platelets 150 - 400 K/uL 123(L) 111(L) 109(L)   . CBC    Component Value Date/Time   WBC 5.1 12/02/2016 1247   RBC 2.77 (L) 12/02/2016 1247   HGB 9.0 (L) 12/02/2016 1247   HCT 26.5 (L) 12/02/2016 1247   HCT 30.2 (L) 08/02/2016 1439   PLT 123 (L) 12/02/2016 1247   MCV 95.7 12/02/2016 1247   MCH 32.5 12/02/2016 1247   MCHC 34.0 12/02/2016 1247   RDW 14.0 12/02/2016 1247   LYMPHSABS 1.1 12/02/2016 1247   MONOABS 0.4 12/02/2016 1247   EOSABS 0.2 12/02/2016 1247   BASOSABS 0.0 12/02/2016 1247    CMP Latest Ref Rng & Units 12/02/2016 08/02/2016 07/31/2016  Glucose 65 - 99 mg/dL 138(H) 93 137(H)  BUN 6 - 20 mg/dL 51(H) 29(H) 30(H)   Creatinine 0.61 - 1.24 mg/dL 2.30(H) 1.81(H) 1.90(H)  Sodium 135 - 145 mmol/L 139 138 140  Potassium 3.5 - 5.1 mmol/L 4.7 4.2 4.4  Chloride 101 - 111 mmol/L 110 108 111  CO2 22 - 32 mmol/L 22 26 23   Calcium 8.9 - 10.3 mg/dL 9.2 8.8(L) 8.4(L)  Total Protein 6.5 - 8.1 g/dL 6.7 7.1 -  Total Bilirubin 0.3 - 1.2 mg/dL 0.4 0.4 -  Alkaline Phos 38 - 126 U/L 46 50 -  AST 15 - 41 U/L 31 27 -  ALT 17 - 63 U/L 30 27 -       RADIOGRAPHIC STUDIES: I have personally reviewed the radiological images as listed and agreed with the findings in the report. Study Result   CLINICAL DATA:  82 year old male with weakness, dizziness, shortness of breath for 3 weeks. Anemia. Former smoker. Initial encounter.  EXAM: CHEST  2 VIEW  COMPARISON:  [facility] chest radiographs 06/26/2016 and earlier.  CT Abdomen and Pelvis 06/26/2016  FINDINGS: Stable mediastinal contours. Mild cardiomegaly. Sequelae of CABG. Visualized tracheal air column is within normal limits. Partially visible abdominal aortic endograft. Stable lung volumes. No pneumothorax, pulmonary edema, pleural effusion or acute pulmonary opacity. Calcified thoracic aortic atherosclerosis. No acute osseous abnormality identified. Stable cholecystectomy clips.  IMPRESSION: 1.  No acute cardiopulmonary abnormality. 2. Calcified aortic atherosclerosis.   Electronically Signed   By: Genevie Ann M.D.   On: 07/29/2016 16:19     ASSESSMENT & PLAN:  Anemia, multifactorial Stage III chronic kidney disease Low B12 Hypogonadism Iron deficiency Lupron use for prostate carcinoma Thrombocytopenia Colonoscopy 03/2015 with telangectasias from RT in rectum, diverticulosis hematuria  82 year old Caucasian male with   #1 normocytic normochromic anemia. Likely multifactorial.  -Acute drop in hemoglobin leading to her recent hospitalization was from a gastrointestinal bleed. EGD and capsule endoscopy did not show overt  source of bleeding. Patient was on NSAIDs which could be a risk factor. -Iron deficiency due to GI bleeding -No evidence of  hemolysis based on normal LDH and haptoglobin. 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   In the setting of chronic kidney disease the goal for ferritin would be more than 100 . -Would replace vitamin B12 as sublingual liquid 1000 g daily for low normal levels . -Vitamin B complex daily  #2 history of prostate cancer diagnosed one year ago treated with radiation therapy -Currently on Lupron every 3 months as per his urologist, his induced hypogonadism contributes to his anemia  Oral iron is constipating to the patient. Given his problems with rectal bleeding I have encouraged him to discontinue additional oral iron. He has received one dose of feraheme. Labs from today are currently available and reviewed H/H are markedly improved at 11.5/33.9. Will await on his ferritin and advise the patient if additional oral iron is needed.  If he experiences GI bleeding again, he is to contact us and go to the emergency room.  Labs reviewed. Results are noted above. Iron pending, will call with these results and schedule for iron infusion, if needed.  His counts are abnormal today. Hemoglobin 9.0. Accordingly, we discussed having labs checked more frequently than our current schedule.   He will be set up for a blood transfusion this week (he notes he feels very weak and dizzy) and he will receive an iron infusion, if needed.  He is scheduled to follow up in cardiology with Dr. Bronson Ing on 12/06/2016.  He will return for labs every 4 weeks and will return for follow up in 8 weeks.   Orders Placed This Encounter  Procedures  . CBC with Differential    Standing Status:   Standing    Number of Occurrences:   10    Standing Expiration Date:   12/02/2017  . Ferritin    Standing Status:    Standing    Number of Occurrences:   10    Standing Expiration Date:   12/02/2017  . Vitamin B12    Standing Status:   Future    Standing Expiration Date:   12/02/2017   All of the patients questions were answered with apparent satisfaction. The patient knows to call the clinic with any problems, questions or concerns.  This document serves as a record of services personally performed by Ancil Linsey, MD. It was created on her behalf by Arlyce Harman, a trained medical scribe. The creation of this record is based on the scribe's personal observations and the provider's statements to them. This document has been checked and approved by the attending provider.  I have reviewed the above documentation for accuracy and completeness and I agree with the above.  Kelby Fam. Penland, MD  12/02/2016 1:55 PM

## 2016-12-03 DIAGNOSIS — R809 Proteinuria, unspecified: Secondary | ICD-10-CM | POA: Diagnosis not present

## 2016-12-03 DIAGNOSIS — N200 Calculus of kidney: Secondary | ICD-10-CM | POA: Diagnosis not present

## 2016-12-03 DIAGNOSIS — N189 Chronic kidney disease, unspecified: Secondary | ICD-10-CM | POA: Diagnosis not present

## 2016-12-03 DIAGNOSIS — Z1159 Encounter for screening for other viral diseases: Secondary | ICD-10-CM | POA: Diagnosis not present

## 2016-12-03 DIAGNOSIS — D519 Vitamin B12 deficiency anemia, unspecified: Secondary | ICD-10-CM | POA: Diagnosis not present

## 2016-12-03 DIAGNOSIS — D509 Iron deficiency anemia, unspecified: Secondary | ICD-10-CM | POA: Diagnosis not present

## 2016-12-03 DIAGNOSIS — E559 Vitamin D deficiency, unspecified: Secondary | ICD-10-CM | POA: Diagnosis not present

## 2016-12-03 DIAGNOSIS — N183 Chronic kidney disease, stage 3 (moderate): Secondary | ICD-10-CM | POA: Diagnosis not present

## 2016-12-03 DIAGNOSIS — I129 Hypertensive chronic kidney disease with stage 1 through stage 4 chronic kidney disease, or unspecified chronic kidney disease: Secondary | ICD-10-CM | POA: Diagnosis not present

## 2016-12-03 DIAGNOSIS — Z79899 Other long term (current) drug therapy: Secondary | ICD-10-CM | POA: Diagnosis not present

## 2016-12-03 DIAGNOSIS — N1330 Unspecified hydronephrosis: Secondary | ICD-10-CM | POA: Diagnosis not present

## 2016-12-04 ENCOUNTER — Encounter (HOSPITAL_COMMUNITY): Payer: Self-pay | Admitting: Hematology & Oncology

## 2016-12-04 ENCOUNTER — Encounter (HOSPITAL_COMMUNITY): Payer: Medicare Other

## 2016-12-04 DIAGNOSIS — I129 Hypertensive chronic kidney disease with stage 1 through stage 4 chronic kidney disease, or unspecified chronic kidney disease: Secondary | ICD-10-CM | POA: Diagnosis not present

## 2016-12-04 DIAGNOSIS — D509 Iron deficiency anemia, unspecified: Secondary | ICD-10-CM

## 2016-12-04 DIAGNOSIS — I714 Abdominal aortic aneurysm, without rupture: Secondary | ICD-10-CM | POA: Diagnosis not present

## 2016-12-04 DIAGNOSIS — D631 Anemia in chronic kidney disease: Secondary | ICD-10-CM | POA: Diagnosis not present

## 2016-12-04 DIAGNOSIS — K921 Melena: Secondary | ICD-10-CM

## 2016-12-04 DIAGNOSIS — N183 Chronic kidney disease, stage 3 (moderate): Secondary | ICD-10-CM | POA: Diagnosis not present

## 2016-12-04 DIAGNOSIS — I2510 Atherosclerotic heart disease of native coronary artery without angina pectoris: Secondary | ICD-10-CM | POA: Diagnosis not present

## 2016-12-04 LAB — PREPARE RBC (CROSSMATCH)

## 2016-12-05 ENCOUNTER — Encounter (HOSPITAL_BASED_OUTPATIENT_CLINIC_OR_DEPARTMENT_OTHER): Payer: Medicare Other

## 2016-12-05 DIAGNOSIS — K921 Melena: Secondary | ICD-10-CM

## 2016-12-05 DIAGNOSIS — I2510 Atherosclerotic heart disease of native coronary artery without angina pectoris: Secondary | ICD-10-CM | POA: Diagnosis not present

## 2016-12-05 DIAGNOSIS — I714 Abdominal aortic aneurysm, without rupture: Secondary | ICD-10-CM | POA: Diagnosis not present

## 2016-12-05 DIAGNOSIS — D509 Iron deficiency anemia, unspecified: Secondary | ICD-10-CM | POA: Diagnosis present

## 2016-12-05 DIAGNOSIS — N183 Chronic kidney disease, stage 3 (moderate): Secondary | ICD-10-CM | POA: Diagnosis not present

## 2016-12-05 DIAGNOSIS — D631 Anemia in chronic kidney disease: Secondary | ICD-10-CM | POA: Diagnosis not present

## 2016-12-05 DIAGNOSIS — I129 Hypertensive chronic kidney disease with stage 1 through stage 4 chronic kidney disease, or unspecified chronic kidney disease: Secondary | ICD-10-CM | POA: Diagnosis not present

## 2016-12-05 MED ORDER — DIPHENHYDRAMINE HCL 25 MG PO CAPS
25.0000 mg | ORAL_CAPSULE | Freq: Once | ORAL | Status: AC
  Administered 2016-12-05: 25 mg via ORAL
  Filled 2016-12-05: qty 1

## 2016-12-05 MED ORDER — ACETAMINOPHEN 325 MG PO TABS
650.0000 mg | ORAL_TABLET | Freq: Once | ORAL | Status: AC
  Administered 2016-12-05: 650 mg via ORAL
  Filled 2016-12-05: qty 2

## 2016-12-05 MED ORDER — SODIUM CHLORIDE 0.9% FLUSH: 10.0000 mL | INTRAVENOUS | Status: DC | PRN

## 2016-12-05 MED ORDER — HEPARIN SOD (PORK) LOCK FLUSH 100 UNIT/ML IV SOLN
500.0000 [IU] | Freq: Every day | INTRAVENOUS | Status: DC | PRN
Start: 2016-12-05 — End: 2016-12-05

## 2016-12-05 MED ORDER — SODIUM CHLORIDE 0.9 % IV SOLN
250.0000 mL | Freq: Once | INTRAVENOUS | Status: AC
  Administered 2016-12-05: 250 mL via INTRAVENOUS

## 2016-12-05 NOTE — Patient Instructions (Signed)
[place] at [facility] Discharge Instructions  RECOMMENDATIONS MADE BY THE CONSULTANT AND ANY TEST RESULTS WILL BE SENT TO YOUR REFERRING PHYSICIAN.  Received blood transfusion of 2 units PRBC's. Follow-up as scheduled. Call clinic for any questions or concerns  Thank you for choosing [place] at [facility] to provide your oncology and hematology care.  To afford each patient quality time with our provider, please arrive at least 15 minutes before your scheduled appointment time.   Beginning January 23rd 2017 lab work for the [facility] will be done in the  Main lab at [facility] on 1st floor. If you have a lab appointment with the [place] please come in thru the  Main Entrance and check in at the main information desk  You need to re-schedule your appointment should you arrive 10 or more minutes late.  We strive to give you quality time with our providers, and arriving late affects you and other patients whose appointments are after yours.  Also, if you no show three or more times for appointments you may be dismissed from the clinic at the providers discretion.     Again, thank you for choosing [facility].  Our hope is that these requests will decrease the amount of time that you wait before being seen by our physicians.       _____________________________________________________________  Should you have questions after your visit to [facility], please contact our office at [phone] between the hours of 8:30 a.m. and 4:30 p.m.  Voicemails left after 4:30 p.m. will not be returned until the following business day.  For prescription refill requests, have your pharmacy contact our office.         Resources For Cancer Patients and their Caregivers ? American Cancer Society: Can assist with transportation, wigs, general needs, runs Look Good Feel Better.        780-149-3299 ? Cancer  Care: Provides financial assistance, online support groups, medication/co-pay assistance.  1-800-813-HOPE 727-078-1217) ? [facility] Assists [place] cancer patients and their families through emotional , educational and financial support.  825-465-6438 ? [place] DSS Where to apply for food stamps, Medicaid and utility assistance. 762-660-5989 ? RCATS: Transportation to medical appointments. 757 683 8702 ? Social Security Administration: May apply for disability if have a Stage IV cancer. 716-616-7967 564-025-9106 ? [employer], Disability and Transit Services: Assists with nutrition, care and transit needs. [place] Support Programs: @10RELATIVEDAYS @ > Cancer Support Group  2nd Tuesday of the month 1pm-2pm, Journey Room  > Creative Journey  3rd Tuesday of the month 1130am-1pm, Journey Room  > Look Good Feel Better  1st Wednesday of the month 10am-12 noon, Journey Room (Call [place] to register [phone])

## 2016-12-05 NOTE — Progress Notes (Signed)
Andrew Zhang tolerated blood transfusion with 2 units of PRBC's well without complaints or incident.VSS throughout blood transfusion and upon discharge. Pt discharged self ambulatory using cane in satisfactory condition accompanied by his wife

## 2016-12-06 ENCOUNTER — Ambulatory Visit: Payer: Medicare Other | Admitting: Cardiovascular Disease

## 2016-12-06 DIAGNOSIS — N1330 Unspecified hydronephrosis: Secondary | ICD-10-CM | POA: Diagnosis not present

## 2016-12-06 DIAGNOSIS — N200 Calculus of kidney: Secondary | ICD-10-CM | POA: Diagnosis not present

## 2016-12-06 DIAGNOSIS — E790 Hyperuricemia without signs of inflammatory arthritis and tophaceous disease: Secondary | ICD-10-CM | POA: Diagnosis not present

## 2016-12-06 LAB — TYPE AND SCREEN
ABO/RH(D): B POS
Antibody Screen: NEGATIVE
UNIT DIVISION: 0
UNIT DIVISION: 0

## 2016-12-10 DIAGNOSIS — Z87442 Personal history of urinary calculi: Secondary | ICD-10-CM | POA: Diagnosis not present

## 2016-12-10 DIAGNOSIS — N1330 Unspecified hydronephrosis: Secondary | ICD-10-CM | POA: Diagnosis not present

## 2016-12-25 ENCOUNTER — Other Ambulatory Visit (HOSPITAL_COMMUNITY): Payer: Self-pay | Admitting: *Deleted

## 2016-12-25 DIAGNOSIS — D509 Iron deficiency anemia, unspecified: Secondary | ICD-10-CM

## 2016-12-27 DIAGNOSIS — N200 Calculus of kidney: Secondary | ICD-10-CM | POA: Diagnosis not present

## 2016-12-27 DIAGNOSIS — N135 Crossing vessel and stricture of ureter without hydronephrosis: Secondary | ICD-10-CM | POA: Diagnosis not present

## 2016-12-31 ENCOUNTER — Encounter (HOSPITAL_COMMUNITY): Payer: Medicare Other | Attending: Hematology & Oncology

## 2016-12-31 DIAGNOSIS — I129 Hypertensive chronic kidney disease with stage 1 through stage 4 chronic kidney disease, or unspecified chronic kidney disease: Secondary | ICD-10-CM | POA: Diagnosis not present

## 2016-12-31 DIAGNOSIS — Z87891 Personal history of nicotine dependence: Secondary | ICD-10-CM | POA: Insufficient documentation

## 2016-12-31 DIAGNOSIS — Z7982 Long term (current) use of aspirin: Secondary | ICD-10-CM | POA: Diagnosis not present

## 2016-12-31 DIAGNOSIS — D696 Thrombocytopenia, unspecified: Secondary | ICD-10-CM | POA: Insufficient documentation

## 2016-12-31 DIAGNOSIS — D509 Iron deficiency anemia, unspecified: Secondary | ICD-10-CM | POA: Diagnosis not present

## 2016-12-31 DIAGNOSIS — Z951 Presence of aortocoronary bypass graft: Secondary | ICD-10-CM | POA: Insufficient documentation

## 2016-12-31 DIAGNOSIS — Z79899 Other long term (current) drug therapy: Secondary | ICD-10-CM | POA: Insufficient documentation

## 2016-12-31 DIAGNOSIS — I714 Abdominal aortic aneurysm, without rupture: Secondary | ICD-10-CM | POA: Diagnosis not present

## 2016-12-31 DIAGNOSIS — C61 Malignant neoplasm of prostate: Secondary | ICD-10-CM | POA: Insufficient documentation

## 2016-12-31 DIAGNOSIS — N200 Calculus of kidney: Secondary | ICD-10-CM | POA: Insufficient documentation

## 2016-12-31 DIAGNOSIS — I2510 Atherosclerotic heart disease of native coronary artery without angina pectoris: Secondary | ICD-10-CM | POA: Diagnosis not present

## 2016-12-31 DIAGNOSIS — N183 Chronic kidney disease, stage 3 (moderate): Secondary | ICD-10-CM | POA: Diagnosis not present

## 2016-12-31 DIAGNOSIS — E785 Hyperlipidemia, unspecified: Secondary | ICD-10-CM | POA: Diagnosis not present

## 2016-12-31 DIAGNOSIS — Z7984 Long term (current) use of oral hypoglycemic drugs: Secondary | ICD-10-CM | POA: Insufficient documentation

## 2016-12-31 DIAGNOSIS — D631 Anemia in chronic kidney disease: Secondary | ICD-10-CM | POA: Diagnosis not present

## 2016-12-31 LAB — CBC WITH DIFFERENTIAL/PLATELET
BASOS ABS: 0 10*3/uL (ref 0.0–0.1)
BASOS PCT: 0 %
EOS ABS: 0.2 10*3/uL (ref 0.0–0.7)
EOS PCT: 4 %
HCT: 32 % — ABNORMAL LOW (ref 39.0–52.0)
Hemoglobin: 10.7 g/dL — ABNORMAL LOW (ref 13.0–17.0)
Lymphocytes Relative: 24 %
Lymphs Abs: 1.1 10*3/uL (ref 0.7–4.0)
MCH: 32.1 pg (ref 26.0–34.0)
MCHC: 33.4 g/dL (ref 30.0–36.0)
MCV: 96.1 fL (ref 78.0–100.0)
Monocytes Absolute: 0.3 10*3/uL (ref 0.1–1.0)
Monocytes Relative: 6 %
NEUTROS PCT: 66 %
Neutro Abs: 2.8 10*3/uL (ref 1.7–7.7)
PLATELETS: 127 10*3/uL — AB (ref 150–400)
RBC: 3.33 MIL/uL — AB (ref 4.22–5.81)
RDW: 13.9 % (ref 11.5–15.5)
WBC: 4.4 10*3/uL (ref 4.0–10.5)

## 2016-12-31 LAB — FERRITIN: FERRITIN: 57 ng/mL (ref 24–336)

## 2016-12-31 LAB — SAMPLE TO BLOOD BANK

## 2017-01-01 DIAGNOSIS — D638 Anemia in other chronic diseases classified elsewhere: Secondary | ICD-10-CM | POA: Diagnosis not present

## 2017-01-01 DIAGNOSIS — N183 Chronic kidney disease, stage 3 (moderate): Secondary | ICD-10-CM | POA: Diagnosis not present

## 2017-01-01 DIAGNOSIS — R809 Proteinuria, unspecified: Secondary | ICD-10-CM | POA: Diagnosis not present

## 2017-01-02 DIAGNOSIS — Z8546 Personal history of malignant neoplasm of prostate: Secondary | ICD-10-CM | POA: Diagnosis not present

## 2017-01-02 DIAGNOSIS — N135 Crossing vessel and stricture of ureter without hydronephrosis: Secondary | ICD-10-CM | POA: Diagnosis not present

## 2017-01-02 DIAGNOSIS — N200 Calculus of kidney: Secondary | ICD-10-CM | POA: Diagnosis not present

## 2017-01-02 DIAGNOSIS — N131 Hydronephrosis with ureteral stricture, not elsewhere classified: Secondary | ICD-10-CM | POA: Diagnosis not present

## 2017-01-02 DIAGNOSIS — E119 Type 2 diabetes mellitus without complications: Secondary | ICD-10-CM | POA: Diagnosis not present

## 2017-01-02 DIAGNOSIS — K219 Gastro-esophageal reflux disease without esophagitis: Secondary | ICD-10-CM | POA: Diagnosis not present

## 2017-01-02 DIAGNOSIS — Z880 Allergy status to penicillin: Secondary | ICD-10-CM | POA: Diagnosis not present

## 2017-01-02 DIAGNOSIS — Z808 Family history of malignant neoplasm of other organs or systems: Secondary | ICD-10-CM | POA: Diagnosis not present

## 2017-01-02 DIAGNOSIS — Z7984 Long term (current) use of oral hypoglycemic drugs: Secondary | ICD-10-CM | POA: Diagnosis not present

## 2017-01-02 DIAGNOSIS — Z466 Encounter for fitting and adjustment of urinary device: Secondary | ICD-10-CM | POA: Diagnosis not present

## 2017-01-02 DIAGNOSIS — E538 Deficiency of other specified B group vitamins: Secondary | ICD-10-CM | POA: Diagnosis not present

## 2017-01-02 DIAGNOSIS — M1990 Unspecified osteoarthritis, unspecified site: Secondary | ICD-10-CM | POA: Diagnosis not present

## 2017-01-02 DIAGNOSIS — N1330 Unspecified hydronephrosis: Secondary | ICD-10-CM | POA: Diagnosis not present

## 2017-01-02 DIAGNOSIS — E785 Hyperlipidemia, unspecified: Secondary | ICD-10-CM | POA: Diagnosis not present

## 2017-01-02 DIAGNOSIS — I519 Heart disease, unspecified: Secondary | ICD-10-CM | POA: Diagnosis not present

## 2017-01-02 DIAGNOSIS — Z951 Presence of aortocoronary bypass graft: Secondary | ICD-10-CM | POA: Diagnosis not present

## 2017-01-02 DIAGNOSIS — Z79899 Other long term (current) drug therapy: Secondary | ICD-10-CM | POA: Diagnosis not present

## 2017-01-02 DIAGNOSIS — N201 Calculus of ureter: Secondary | ICD-10-CM | POA: Diagnosis not present

## 2017-01-02 DIAGNOSIS — I10 Essential (primary) hypertension: Secondary | ICD-10-CM | POA: Diagnosis not present

## 2017-01-02 DIAGNOSIS — Z923 Personal history of irradiation: Secondary | ICD-10-CM | POA: Diagnosis not present

## 2017-01-02 DIAGNOSIS — G4730 Sleep apnea, unspecified: Secondary | ICD-10-CM | POA: Diagnosis not present

## 2017-01-02 DIAGNOSIS — F329 Major depressive disorder, single episode, unspecified: Secondary | ICD-10-CM | POA: Diagnosis not present

## 2017-01-02 DIAGNOSIS — Z87891 Personal history of nicotine dependence: Secondary | ICD-10-CM | POA: Diagnosis not present

## 2017-01-02 DIAGNOSIS — Z7982 Long term (current) use of aspirin: Secondary | ICD-10-CM | POA: Diagnosis not present

## 2017-01-02 DIAGNOSIS — Z881 Allergy status to other antibiotic agents status: Secondary | ICD-10-CM | POA: Diagnosis not present

## 2017-01-06 DIAGNOSIS — L299 Pruritus, unspecified: Secondary | ICD-10-CM | POA: Diagnosis not present

## 2017-01-06 DIAGNOSIS — L509 Urticaria, unspecified: Secondary | ICD-10-CM | POA: Diagnosis not present

## 2017-01-07 ENCOUNTER — Telehealth (HOSPITAL_COMMUNITY): Payer: Self-pay

## 2017-01-07 NOTE — Progress Notes (Signed)
Reviewing patient's labs done by Dr. Whitney Muse. Noted patient required transfusion last month.   Would recommend nonurgent ov with SLF only in next 4-6 weeks for transfusion dependent anemia.

## 2017-01-07 NOTE — Telephone Encounter (Signed)
-----   Message from Louis Meckel sent at 01/07/2017 11:59 AM EST ----- Regarding:  results Please call patient with his lab results  Thanks

## 2017-01-07 NOTE — Telephone Encounter (Signed)
After reviewing with PA, called patient to let him know lab results. Spoke with wife. Let her know that his labs were good. She verbalized understanding.

## 2017-01-08 ENCOUNTER — Other Ambulatory Visit (HOSPITAL_COMMUNITY): Payer: Self-pay | Admitting: Hematology & Oncology

## 2017-01-08 DIAGNOSIS — D500 Iron deficiency anemia secondary to blood loss (chronic): Secondary | ICD-10-CM | POA: Insufficient documentation

## 2017-01-08 HISTORY — DX: Iron deficiency anemia secondary to blood loss (chronic): D50.0

## 2017-01-08 NOTE — Progress Notes (Signed)
I spoke to pt's wife today( 01/08/2017) and scheduled pt for an OV with Dr. Oneida Alar on 02/05/2017 at 2:00 pm.

## 2017-01-14 ENCOUNTER — Encounter (HOSPITAL_BASED_OUTPATIENT_CLINIC_OR_DEPARTMENT_OTHER): Payer: Medicare Other

## 2017-01-14 VITALS — BP 120/54 | HR 64 | Temp 97.4°F | Resp 18

## 2017-01-14 DIAGNOSIS — D509 Iron deficiency anemia, unspecified: Secondary | ICD-10-CM

## 2017-01-14 DIAGNOSIS — D500 Iron deficiency anemia secondary to blood loss (chronic): Secondary | ICD-10-CM

## 2017-01-14 MED ORDER — SODIUM CHLORIDE 0.9 % IV SOLN
INTRAVENOUS | Status: DC
  Administered 2017-01-14: 14:00:00 via INTRAVENOUS

## 2017-01-14 MED ORDER — SODIUM CHLORIDE 0.9 % IV SOLN
510.0000 mg | Freq: Once | INTRAVENOUS | Status: AC
  Administered 2017-01-14: 510 mg via INTRAVENOUS
  Filled 2017-01-14: qty 17

## 2017-01-14 NOTE — Patient Instructions (Signed)
[facility] at [facility] Discharge Instructions  RECOMMENDATIONS MADE BY THE CONSULTANT AND ANY TEST RESULTS WILL BE SENT TO YOUR REFERRING PHYSICIAN.  Received Feraheme infusion today.Follow-up as scheduled. Call clinic for any questions or concerns  Thank you for choosing  [facility] at [facility] to provide your oncology and hematology care.  To afford each patient quality time with our provider, please arrive at least 15 minutes before your scheduled appointment time.    If you have a lab appointment with the Cancer Center please come in thru the  Main Entrance and check in at the main information desk  You need to re-schedule your appointment should you arrive 10 or more minutes late.  We strive to give you quality time with our providers, and arriving late affects you and other patients whose appointments are after yours.  Also, if you no show three or more times for appointments you may be dismissed from the clinic at the providers discretion.     Again, thank you for choosing [facility].  Our hope is that these requests will decrease the amount of time that you wait before being seen by our physicians.       _____________________________________________________________  Should you have questions after your visit to [facility], please contact our office at [phone] between the hours of 8:30 a.m. and 4:30 p.m.  Voicemails left after 4:30 p.m. will not be returned until the following business day.  For prescription refill requests, have your pharmacy contact our office.       Resources For Cancer Patients and their Caregivers ? American Cancer Society: Can assist with transportation, wigs, general needs, runs Look Good Feel Better.        1-888-227-6333 ? Cancer Care: Provides financial assistance, online support groups, medication/co-pay assistance.  1-800-813-HOPE (4673) ? [facility] Assists [place] cancer patients and their families through emotional , educational and financial support.  336-427-4357 ? [place] DSS Where to apply for food stamps, Medicaid and utility assistance. 336-342-1394 ? RCATS: Transportation to medical appointments. 336-347-2287 ? Social Security Administration: May apply for disability if have a Stage IV cancer. 336-342-7796 1-800-772-1213 ? [place] A[facility], Disability and Transit Services: Assists with nutrition, care and transit needs. 336-349-2343  Cancer Center Support Programs: @10RELATIVEDAYS@ > Cancer Support Group  2nd Tuesday of the month 1pm-2pm, Journey Room  > Creative Journey  3rd Tuesday of the month 1130am-1pm, Journey Room  > Look Good Feel Better  1st Wednesday of the month 10am-12 noon, Journey Room (Call American Cancer [employer] to register 1-800-395-5775)   

## 2017-01-14 NOTE — Progress Notes (Signed)
Andrew Zhang tolerated Feraheme infusion well without complaints or incident.VSS upon discharge. Pt discharged self ambulatory using cane in satisfactory condition accompanied by his wife

## 2017-01-20 DIAGNOSIS — N135 Crossing vessel and stricture of ureter without hydronephrosis: Secondary | ICD-10-CM | POA: Diagnosis not present

## 2017-01-20 DIAGNOSIS — N200 Calculus of kidney: Secondary | ICD-10-CM | POA: Diagnosis not present

## 2017-01-20 DIAGNOSIS — N1330 Unspecified hydronephrosis: Secondary | ICD-10-CM | POA: Diagnosis not present

## 2017-01-22 ENCOUNTER — Encounter: Payer: Self-pay | Admitting: *Deleted

## 2017-01-23 ENCOUNTER — Ambulatory Visit (INDEPENDENT_AMBULATORY_CARE_PROVIDER_SITE_OTHER): Payer: Medicare Other | Admitting: Cardiovascular Disease

## 2017-01-23 ENCOUNTER — Encounter: Payer: Self-pay | Admitting: Cardiovascular Disease

## 2017-01-23 VITALS — BP 117/66 | HR 68 | Ht 74.0 in | Wt 205.4 lb

## 2017-01-23 DIAGNOSIS — I25768 Atherosclerosis of bypass graft of coronary artery of transplanted heart with other forms of angina pectoris: Secondary | ICD-10-CM | POA: Diagnosis not present

## 2017-01-23 DIAGNOSIS — I714 Abdominal aortic aneurysm, without rupture, unspecified: Secondary | ICD-10-CM

## 2017-01-23 DIAGNOSIS — D61818 Other pancytopenia: Secondary | ICD-10-CM

## 2017-01-23 DIAGNOSIS — E782 Mixed hyperlipidemia: Secondary | ICD-10-CM | POA: Diagnosis not present

## 2017-01-23 DIAGNOSIS — Z951 Presence of aortocoronary bypass graft: Secondary | ICD-10-CM

## 2017-01-23 DIAGNOSIS — I209 Angina pectoris, unspecified: Secondary | ICD-10-CM

## 2017-01-23 DIAGNOSIS — I10 Essential (primary) hypertension: Secondary | ICD-10-CM

## 2017-01-23 NOTE — Patient Instructions (Signed)

## 2017-01-23 NOTE — Progress Notes (Signed)
SUBJECTIVE: Andrew Zhang has a history of DM II, HTN, PVD, and CAD, with a h/o CABG in 02/1994 by Dr. Nils Pyle with a LIMA to LAD, SVG to D1, and SVG to RCA.   Nuclear stress test on 09/14/14 demonstrated myocardial scar in the inferior wall extending from the apex to the base with no evidence of ischemia. Calculated LVEF 50%.  He also has a history of normocytic normochromic anemia likely multifactorial in etiology including iron deficiency, chronic kidney disease stage III, low testosterone, and GI bleeding. Hemoglobin 10.7, platelets 127 on 12/31/16.  He denies chest pain, leg swelling, orthopnea, exertional dyspnea, and paroxysmal nocturnal dyspnea.  Review of Systems: As per "subjective", otherwise negative.  Allergies  Allergen Reactions  . Penicillins Rash    Current Outpatient Prescriptions  Medication Sig Dispense Refill  . Calcium Carbonate-Vitamin D (CALTRATE 600+D) 600-400 MG-UNIT per tablet Take 1 tablet by mouth daily.    . Cyanocobalamin 1000 MCG SUBL Place 1 tablet (1,000 mcg total) under the tongue daily. 30 tablet 3  . ferrous sulfate 325 (65 FE) MG tablet Take 1 tablet (325 mg total) by mouth 2 (two) times daily with a meal. 60 tablet 0  . finasteride (PROSCAR) 5 MG tablet Take 5 mg by mouth daily.    Marland Kitchen glimepiride (AMARYL) 2 MG tablet Take 2 mg by mouth daily with breakfast.     . lisinopril (PRINIVIL,ZESTRIL) 2.5 MG tablet Take 2.5 mg by mouth daily.    . metFORMIN (GLUCOPHAGE) 500 MG tablet Take 500 mg by mouth every evening.    . nitroGLYCERIN (NITROSTAT) 0.4 MG SL tablet Place 1 tablet (0.4 mg total) under the tongue every 5 (five) minutes as needed for chest pain. 25 tablet 3  . omeprazole (PRILOSEC) 20 MG capsule Take 1 capsule (20 mg total) by mouth 2 (two) times daily before a meal. 60 capsule 0  . sertraline (ZOLOFT) 100 MG tablet Take 50 mg by mouth daily.    . simvastatin (ZOCOR) 80 MG tablet Take 80 mg by mouth at bedtime.    . tamsulosin (FLOMAX)  0.4 MG CAPS Take 0.4 mg by mouth daily after supper.    Marland Kitchen aspirin 81 MG chewable tablet Chew 81 mg by mouth daily.     No current facility-administered medications for this visit.     Past Medical History:  Diagnosis Date  . AAA (abdominal aortic aneurysm) ([place])   . Arthritis   . B12 deficiency   . CAD (coronary artery disease)   . Cancer ([employer])   . Depression   . Diverticulitis   . DMII (diabetes mellitus, type 2) ([place]) 2008  . GERD (gastroesophageal reflux disease)   . Hiatal hernia   . HTN (hypertension)   . Hyperlipidemia   . Prostate cancer [facility])     Past Surgical History:  Procedure Laterality Date  . APPENDECTOMY    . BACK SURGERY    . CHOLECYSTECTOMY    . COLONOSCOPY  03/2015   Dr. Britta Mccreedy: Diverticulosis, single tubular adenoma removed.  . CORONARY ARTERY BYPASS GRAFT    . DESCENDING AORTIC ANEURYSM REPAIR W/ STENT    . ESOPHAGOGASTRODUODENOSCOPY N/A 07/30/2016   Dr. Oneida Alar. Nonobstructing Schatzki ring at GE junction, multiple small sessile polyps with no bleeding or stigmata of recent bleeding in the gastric fundus and gastric body. Benign-appearing intrinsic moderate stenosis found the pylorus status post dilation. Small bowel capsule deployed.  Marland Kitchen GIVENS CAPSULE STUDY     Capsule study  is complete to the cecum. No obvious lesions, mass, tumors. Small wisps of blood noted in small bowel secondary to EGD/dilation. Possible few erosions in setting of NSAIDs. No obvious areas of bleeding.    Social History   Social History  . Marital status: Married    Spouse name: N/A  . Number of children: N/A  . Years of education: N/A   Occupational History  . Not on file.   Social History Main Topics  . Smoking status: Former Smoker    Packs/day: 3.00    Years: 45.00    Types: Cigarettes    Start date: 04/19/1951    Quit date: 03/01/1994  . Smokeless tobacco: Never Used  . Alcohol use 0.0 oz/week     Comment: 1-2 beers daily   . Drug use: No  . Sexual activity: Not on  file     Comment: married 59 years - 5 children    Other Topics Concern  . Not on file   Social History Narrative  . No narrative on file     Vitals:   01/23/17 1249  BP: 117/66  Pulse: 68  SpO2: 96%  Weight: 205 lb 6.4 oz (93.2 kg)  Height: 6\' 2"  (1.88 m)    PHYSICAL EXAM General: NAD HEENT: Normal. Neck: No JVD, no thyromegaly. Lungs: Clear to auscultation bilaterally with normal respiratory effort. CV: Nondisplaced PMI. Regular rate and rhythm, normal S1/S2, no S3/S4, no murmur. No pretibial or periankle edema. Soft b/l carotid bruits.  Abdomen: Soft, nontender, no distention.  Neurologic: Alert and oriented.  Psych: Normal affect. Skin: Normal. Musculoskeletal: No gross deformities.    ECG: Most recent ECG reviewed.      ASSESSMENT AND PLAN: 1. CAD s/p 3-vessel CABG: No ischemia by stress testing in September 2015. Symptomatically stable. Continue present medication regimen with ASA and statin.  2. Essential HTN: Controlled on present therapy. No adjustments necessary.   3. Hyperlipidemia: Continue simvastatin 80 mg.  4. PVD: He follows with Dr. Sammuel Hines at [facility]. Has mild carotid artery stenosis bilaterally and AAA. There was a small type II endovascular leak from IMA deemed stable (EVAR in 2011).   5. Pancytopenia: Hemoglobin 10.7, platelets 127 on 12/31/16. I previously made a referral to Dr. Whitney Muse (hematology), whom he now follows with.  Dispo: f/u 6 months.   Kate Sable, M.D., F.A.C.C.

## 2017-01-30 ENCOUNTER — Encounter (HOSPITAL_COMMUNITY): Payer: Medicare Other

## 2017-01-30 ENCOUNTER — Encounter (HOSPITAL_COMMUNITY): Payer: Self-pay

## 2017-01-30 ENCOUNTER — Encounter (HOSPITAL_COMMUNITY): Payer: Medicare Other | Attending: Oncology | Admitting: Oncology

## 2017-01-30 VITALS — BP 121/46 | HR 72 | Temp 97.8°F | Resp 18 | Wt 206.4 lb

## 2017-01-30 DIAGNOSIS — D696 Thrombocytopenia, unspecified: Secondary | ICD-10-CM | POA: Insufficient documentation

## 2017-01-30 DIAGNOSIS — E538 Deficiency of other specified B group vitamins: Secondary | ICD-10-CM | POA: Diagnosis not present

## 2017-01-30 DIAGNOSIS — K5730 Diverticulosis of large intestine without perforation or abscess without bleeding: Secondary | ICD-10-CM | POA: Insufficient documentation

## 2017-01-30 DIAGNOSIS — C61 Malignant neoplasm of prostate: Secondary | ICD-10-CM | POA: Diagnosis not present

## 2017-01-30 DIAGNOSIS — R319 Hematuria, unspecified: Secondary | ICD-10-CM | POA: Diagnosis not present

## 2017-01-30 DIAGNOSIS — N183 Chronic kidney disease, stage 3 (moderate): Secondary | ICD-10-CM | POA: Diagnosis not present

## 2017-01-30 DIAGNOSIS — D509 Iron deficiency anemia, unspecified: Secondary | ICD-10-CM | POA: Diagnosis not present

## 2017-01-30 DIAGNOSIS — I129 Hypertensive chronic kidney disease with stage 1 through stage 4 chronic kidney disease, or unspecified chronic kidney disease: Secondary | ICD-10-CM | POA: Insufficient documentation

## 2017-01-30 DIAGNOSIS — D631 Anemia in chronic kidney disease: Secondary | ICD-10-CM | POA: Insufficient documentation

## 2017-01-30 LAB — CBC WITH DIFFERENTIAL/PLATELET
Basophils Absolute: 0 K/uL (ref 0.0–0.1)
Basophils Relative: 0 %
Eosinophils Absolute: 0.1 K/uL (ref 0.0–0.7)
Eosinophils Relative: 3 %
HCT: 31.2 % — ABNORMAL LOW (ref 39.0–52.0)
Hemoglobin: 10.3 g/dL — ABNORMAL LOW (ref 13.0–17.0)
Lymphocytes Relative: 18 %
Lymphs Abs: 0.8 K/uL (ref 0.7–4.0)
MCH: 31.8 pg (ref 26.0–34.0)
MCHC: 33 g/dL (ref 30.0–36.0)
MCV: 96.3 fL (ref 78.0–100.0)
Monocytes Absolute: 0.3 K/uL (ref 0.1–1.0)
Monocytes Relative: 7 %
Neutro Abs: 3.2 K/uL (ref 1.7–7.7)
Neutrophils Relative %: 72 %
Platelets: 122 K/uL — ABNORMAL LOW (ref 150–400)
RBC: 3.24 MIL/uL — ABNORMAL LOW (ref 4.22–5.81)
RDW: 14.6 % (ref 11.5–15.5)
WBC: 4.5 K/uL (ref 4.0–10.5)

## 2017-01-30 LAB — FERRITIN: Ferritin: 150 ng/mL (ref 24–336)

## 2017-01-30 LAB — VITAMIN B12: Vitamin B-12: 873 pg/mL (ref 180–914)

## 2017-01-30 NOTE — Patient Instructions (Signed)
[place] at [facility] Discharge Instructions  RECOMMENDATIONS MADE BY THE CONSULTANT AND ANY TEST RESULTS WILL BE SENT TO YOUR REFERRING PHYSICIAN.  You were seen today by Dr. Barron Schmid Follow up in 8 weeks with lab work  Thank you for choosing [place] at [facility] to provide your oncology and hematology care.  To afford each patient quality time with our provider, please arrive at least 15 minutes before your scheduled appointment time.    If you have a lab appointment with the [place] please come in thru the  Main Entrance and check in at the main information desk  You need to re-schedule your appointment should you arrive 10 or more minutes late.  We strive to give you quality time with our providers, and arriving late affects you and other patients whose appointments are after yours.  Also, if you no show three or more times for appointments you may be dismissed from the clinic at the providers discretion.     Again, thank you for choosing [facility].  Our hope is that these requests will decrease the amount of time that you wait before being seen by our physicians.       _____________________________________________________________  Should you have questions after your visit to [facility], please contact our office at [phone] between the hours of 8:30 a.m. and 4:30 p.m.  Voicemails left after 4:30 p.m. will not be returned until the following business day.  For prescription refill requests, have your pharmacy contact our office.       Resources For Cancer Patients and their Caregivers ? American Cancer Society: Can assist with transportation, wigs, general needs, runs Look Good Feel Better.        (613)187-4226 ? Cancer Care: Provides financial assistance, online support groups, medication/co-pay assistance.  1-800-813-HOPE 812 180 8132) ? [facility] Assists  [place] cancer patients and their families through emotional , educational and financial support.  (520) 488-7134 ? [place] DSS Where to apply for food stamps, Medicaid and utility assistance. (406)230-6602 ? [facility]: Transportation to medical appointments. 450-207-2631 ? Social Security Administration: May apply for disability if have a Stage IV cancer. 913 459 6713 508 748 4595 ? [employer], Disability and Transit Services: Assists with nutrition, care and transit needs. [place] Support Programs: @10RELATIVEDAYS @ > Cancer Support Group  2nd Tuesday of the month 1pm-2pm, Journey Room  > Creative Journey  3rd Tuesday of the month 1130am-1pm, Journey Room  > Look Good Feel Better  1st Wednesday of the month 10am-12 noon, Journey Room (Call [place] to register [phone])

## 2017-01-30 NOTE — Progress Notes (Signed)
HEMATOLOGY/ONCOLOGY PROGRESS NOTE  Date of Service: 01/30/2017  Patient Care Team: [name], MD as PCP - General (Family Medicine) Herminio Commons, MD as Attending Physician (Cardiology) Danie Binder, MD as Consulting Physician (Gastroenterology)  CHIEF COMPLAINTS/PURPOSE OF CONSULTATION:  Anemia Stage III chronic kidney disease Low B12 Hypogonadism Iron deficiency  HISTORY OF PRESENTING ILLNESS:   Andrew Zhang is a wonderful 83 y.o. male who has been referred to [place] by Dr [name], MD for evaluation and management of Anemia.  Patient has a history of coronary artery disease status post CABG, hypertension, diabetes, dyslipidemia, abdominal aortic aneurysm status post repair, prostate cancer currently on Lupron shots every 3 months (follows with Dr Willow Ora).  Andrew Zhang is accompanied by his wife. I personally reviewed and went over laboratory studies with the patient.  Andrew Zhang presents today for follow up with his wife. He states he has been doing well. He has a left ureteral stent in place that's going to get exchanged next week.   He denies any fatigue, chest pain, SOB, abdominal pain. Denies any bleeding including hematochezia, melena, hematuria, hemoptysis.   MEDICAL HISTORY:  Past Medical History:  Diagnosis Date  . AAA (abdominal aortic aneurysm) ([place])   . Arthritis   . B12 deficiency   . CAD (coronary artery disease)   . Cancer ([place])   . Depression   . Diverticulitis   . DMII (diabetes mellitus, type 2) ([place]) 2008  . GERD (gastroesophageal reflux disease)   . Hiatal hernia   . HTN (hypertension)   . Hyperlipidemia   . Prostate cancer [facility])     SURGICAL HISTORY: Past Surgical History:  Procedure Laterality Date  . APPENDECTOMY    . BACK SURGERY    . CHOLECYSTECTOMY    . COLONOSCOPY  03/2015   Dr. Britta Mccreedy: Diverticulosis, single tubular adenoma removed.  . CORONARY ARTERY BYPASS GRAFT    . DESCENDING AORTIC ANEURYSM REPAIR W/ STENT     . ESOPHAGOGASTRODUODENOSCOPY N/A 07/30/2016   Dr. Oneida Alar. Nonobstructing Schatzki ring at GE junction, multiple small sessile polyps with no bleeding or stigmata of recent bleeding in the gastric fundus and gastric body. Benign-appearing intrinsic moderate stenosis found the pylorus status post dilation. Small bowel capsule deployed.  Andrew Zhang Kitchen GIVENS CAPSULE STUDY     Capsule study is complete to the cecum. No obvious lesions, mass, tumors. Small wisps of blood noted in small bowel secondary to EGD/dilation. Possible few erosions in setting of NSAIDs. No obvious areas of bleeding.    SOCIAL HISTORY: Social History   Social History  . Marital status: Married    Spouse name: N/A  . Number of children: N/A  . Years of education: N/A   Occupational History  . Not on file.   Social History Main Topics  . Smoking status: Former Smoker    Packs/day: 3.00    Years: 45.00    Types: Cigarettes    Start date: 04/19/1949    Quit date: 03/01/1994  . Smokeless tobacco: Never Used  . Alcohol use 0.0 oz/week     Comment: 1-2 beers daily   . Drug use: No  . Sexual activity: Not on file     Comment: married 59 years - 5 children    Other Topics Concern  . Not on file   Social History Narrative  . No narrative on file    FAMILY HISTORY: Family History  Problem Relation Age of Onset  . Heart disease    . Diabetes    .  Hypertension    . Liver cancer Mother   . Colon cancer Neg Hx     ALLERGIES:  is allergic to penicillins.  MEDICATIONS:  Current Outpatient Prescriptions  Medication Sig Dispense Refill  . aspirin 81 MG chewable tablet Chew 81 mg by mouth daily.    . Calcium Carbonate-Vitamin D (CALTRATE 600+D) 600-400 MG-UNIT per tablet Take 1 tablet by mouth daily.    . Cyanocobalamin 1000 MCG SUBL Place 1 tablet (1,000 mcg total) under the tongue daily. 30 tablet 3  . ferrous sulfate 325 (65 FE) MG tablet Take 1 tablet (325 mg total) by mouth 2 (two) times daily with a meal. 60 tablet 0    . finasteride (PROSCAR) 5 MG tablet Take 5 mg by mouth daily.    Andrew Zhang Kitchen glimepiride (AMARYL) 2 MG tablet Take 2 mg by mouth daily with breakfast.     . lisinopril (PRINIVIL,ZESTRIL) 2.5 MG tablet Take 2.5 mg by mouth daily.    . metFORMIN (GLUCOPHAGE) 500 MG tablet Take 500 mg by mouth every evening.    . nitroGLYCERIN (NITROSTAT) 0.4 MG SL tablet Place 1 tablet (0.4 mg total) under the tongue every 5 (five) minutes as needed for chest pain. 25 tablet 3  . omeprazole (PRILOSEC) 20 MG capsule Take 1 capsule (20 mg total) by mouth 2 (two) times daily before a meal. 60 capsule 0  . sertraline (ZOLOFT) 100 MG tablet Take 50 mg by mouth daily.    . simvastatin (ZOCOR) 80 MG tablet Take 80 mg by mouth at bedtime.    . tamsulosin (FLOMAX) 0.4 MG CAPS Take 0.4 mg by mouth daily after supper.     No current facility-administered medications for this visit.     REVIEW OF SYSTEMS:   Review of Systems  Constitutional: Negative.   HENT: Negative.   Eyes: Negative.   Respiratory: Negative.   Cardiovascular: Negative.   Gastrointestinal: Negative.   Genitourinary: Negative.   Musculoskeletal: Negative.   Skin: Negative.   Neurological: Negative.   Endo/Heme/Allergies: Negative.   Psychiatric/Behavioral: Negative.   All other systems reviewed and are negative.  14 point review of systems was performed and is negative except as detailed under history of present illness and above   PHYSICAL EXAMINATION: ECOG PERFORMANCE STATUS: 1 - Symptomatic but completely ambulatory  Vitals:   01/30/17 1126  BP: (!) 121/46  Pulse: 72  Resp: 18  Temp: 97.8 F (36.6 C)    Physical Exam  Constitutional: He is oriented to person, place, and time and well-developed, well-nourished, and in no distress.  HENT:  Head: Normocephalic and atraumatic.  Mouth/Throat: Oropharynx is clear and moist.  Eyes: Conjunctivae and EOM are normal. Pupils are equal, round, and reactive to light.  Neck: Normal range of motion.  Neck supple.  Cardiovascular: Normal rate, regular rhythm and normal heart sounds.   Pulmonary/Chest: Effort normal and breath sounds normal.  Abdominal: Soft. Bowel sounds are normal.  Musculoskeletal: Normal range of motion.  Neurological: He is alert and oriented to person, place, and time. Gait normal.  Skin: Skin is warm and dry.  Nursing note and vitals reviewed.   LABORATORY DATA:  I have reviewed the data as listed  CBC Latest Ref Rng & Units 12/31/2016 12/02/2016 10/02/2016  WBC 4.0 - 10.5 K/uL 4.4 5.1 4.7  Hemoglobin 13.0 - 17.0 g/dL 10.7(L) 9.0(L) 11.5(L)  Hematocrit 39.0 - 52.0 % 32.0(L) 26.5(L) 33.9(L)  Platelets 150 - 400 K/uL 127(L) 123(L) 111(L)   . CBC  Component Value Date/Time   WBC 4.4 12/31/2016 1330   RBC 3.33 (L) 12/31/2016 1330   HGB 10.7 (L) 12/31/2016 1330   HCT 32.0 (L) 12/31/2016 1330   HCT 30.2 (L) 08/02/2016 1439   PLT 127 (L) 12/31/2016 1330   MCV 96.1 12/31/2016 1330   MCH 32.1 12/31/2016 1330   MCHC 33.4 12/31/2016 1330   RDW 13.9 12/31/2016 1330   LYMPHSABS 1.1 12/31/2016 1330   MONOABS 0.3 12/31/2016 1330   EOSABS 0.2 12/31/2016 1330   BASOSABS 0.0 12/31/2016 1330    CMP Latest Ref Rng & Units 12/02/2016 08/02/2016 07/31/2016  Glucose 65 - 99 mg/dL 138(H) 93 137(H)  BUN 6 - 20 mg/dL 51(H) 29(H) 30(H)  Creatinine 0.61 - 1.24 mg/dL 2.30(H) 1.81(H) 1.90(H)  Sodium 135 - 145 mmol/L 139 138 140  Potassium 3.5 - 5.1 mmol/L 4.7 4.2 4.4  Chloride 101 - 111 mmol/L 110 108 111  CO2 22 - 32 mmol/L 22 26 23   Calcium 8.9 - 10.3 mg/dL 9.2 8.8(L) 8.4(L)  Total Protein 6.5 - 8.1 g/dL 6.7 7.1 -  Total Bilirubin 0.3 - 1.2 mg/dL 0.4 0.4 -  Alkaline Phos 38 - 126 U/L 46 50 -  AST 15 - 41 U/L 31 27 -  ALT 17 - 63 U/L 30 27 -       RADIOGRAPHIC STUDIES: I have personally reviewed the radiological images as listed and agreed with the findings in the report. Study Result   CLINICAL DATA:  83 year old male with weakness, dizziness, shortness of breath  for 3 weeks. Anemia. Former smoker. Initial encounter.  EXAM: CHEST  2 VIEW  COMPARISON:  [facility] chest radiographs 06/26/2016 and earlier.  CT Abdomen and Pelvis 06/26/2016  FINDINGS: Stable mediastinal contours. Mild cardiomegaly. Sequelae of CABG. Visualized tracheal air column is within normal limits. Partially visible abdominal aortic endograft. Stable lung volumes. No pneumothorax, pulmonary edema, pleural effusion or acute pulmonary opacity. Calcified thoracic aortic atherosclerosis. No acute osseous abnormality identified. Stable cholecystectomy clips.  IMPRESSION: 1.  No acute cardiopulmonary abnormality. 2. Calcified aortic atherosclerosis.   Electronically Signed   By: Genevie Ann M.D.   On: 07/29/2016 16:19     ASSESSMENT & PLAN:  Anemia, multifactorial Stage III chronic kidney disease Low B12 Hypogonadism Iron deficiency Lupron use for prostate carcinoma Thrombocytopenia Colonoscopy 03/2015 with telangectasias from RT in rectum, diverticulosis hematuria  83 year old Caucasian male with   #1 normocytic normochromic anemia. Likely multifactorial.  -Acute drop in hemoglobin leading to her recent hospitalization was from a gastrointestinal bleed. EGD and capsule endoscopy did not show overt source of bleeding. Patient was on NSAIDs which could be a risk factor. -Iron deficiency due to GI bleeding. Received one dose of feraheme on 01/14/17. Hemoglobin stable.  -No evidence of hemolysis based on normal LDH and haptoglobin. 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   In the setting of chronic kidney disease the goal for ferritin would be more than 100 . -Would replace vitamin B12 as sublingual liquid 1000 g daily for low normal levels . -Vitamin B complex daily - Discussed that if his hemoglobin should decrease below 10 g/dL, will consider  aranesp for the patient.   #2 history of prostate cancer diagnosed one year ago treated with radiation therapy -Currently on Lupron every 3 months as per his urologist, his induced hypogonadism contributes to his anemia  Labs reviewed in detail  with the patient and his wife today.  Continue to monitor his labs. RTC in 2 months for follow up.   All of the patients questions were answered with apparent satisfaction. The patient knows to call the clinic with any problems, questions or concerns.  This document serves as a record of services personally performed by Twana First, MD. It was created on her behalf by Shirlean Mylar, a trained medical scribe. The creation of this record is based on the scribe's personal observations and the provider's statements to them. This document has been checked and approved by the attending provider.  I have reviewed the above documentation for accuracy and completeness and I agree with the above.  01/30/2017 9:16 AM

## 2017-02-03 ENCOUNTER — Ambulatory Visit (HOSPITAL_COMMUNITY): Payer: Medicare Other | Admitting: Hematology & Oncology

## 2017-02-04 DIAGNOSIS — N184 Chronic kidney disease, stage 4 (severe): Secondary | ICD-10-CM | POA: Diagnosis not present

## 2017-02-04 DIAGNOSIS — E1165 Type 2 diabetes mellitus with hyperglycemia: Secondary | ICD-10-CM | POA: Diagnosis not present

## 2017-02-04 DIAGNOSIS — N183 Chronic kidney disease, stage 3 (moderate): Secondary | ICD-10-CM | POA: Diagnosis not present

## 2017-02-04 DIAGNOSIS — C61 Malignant neoplasm of prostate: Secondary | ICD-10-CM | POA: Diagnosis not present

## 2017-02-05 ENCOUNTER — Ambulatory Visit (INDEPENDENT_AMBULATORY_CARE_PROVIDER_SITE_OTHER): Payer: Medicare Other | Admitting: Gastroenterology

## 2017-02-05 ENCOUNTER — Encounter: Payer: Self-pay | Admitting: Gastroenterology

## 2017-02-05 ENCOUNTER — Other Ambulatory Visit: Payer: Self-pay

## 2017-02-05 DIAGNOSIS — D500 Iron deficiency anemia secondary to blood loss (chronic): Secondary | ICD-10-CM | POA: Diagnosis not present

## 2017-02-05 DIAGNOSIS — K625 Hemorrhage of anus and rectum: Secondary | ICD-10-CM

## 2017-02-05 DIAGNOSIS — I25768 Atherosclerosis of bypass graft of coronary artery of transplanted heart with other forms of angina pectoris: Secondary | ICD-10-CM

## 2017-02-05 NOTE — Patient Instructions (Addendum)
CONTINUE ASPIRIN.  AVOID ANTI-INFLAMMATORY DRUGS.  I WILL TALK WITH YOUR KIDNEY DOCTORS. I THINK YOU COULD BENEFIT FROM PROCRIT.  REPEAT COLONOSCOPY IN 3 WEEKS AND WE WILL CAUTERIZE ANY SUPERFICIAL VESSELS IN YOUR RECTUM.  FOLLOW UP IN 3 MOS.

## 2017-02-05 NOTE — Assessment & Plan Note (Addendum)
WITH OCCASIONAL HEMATOCHEZIA AND A TRANSFUSION DEPENDENT. ANEMIA PERSISTS IN SPITE OF 5u p RBCs SINCE DEC 2017, IV FE AND VIT B12. ANEMIA IS MULTIFACTORIAL-ASA USE, RADIATION PROCTITIS, AND COMPLICATED BY CRI AND LOW PLTS. EGD/GIVENS IN 2017-NO SOURCE FOR ANEMIA IDENTIFIED.  CONTINUE ASPIRIN.  AVOID NSAIDs. LEFT MESSAGE WITH DR. Theador Hawthorne. I THINK PT COULD BENEFIT FROM PROCRIT. FOLLOW UP WITH HEMATOLOGY. REPEAT COLONOSCOPY  W/ MAC(POLYPHARMACY) IN 3 WEEKS AND WE WILL CAUTERIZE ANY SUPERFICIAL VESSELS IN YOUR RECTUM. DISCUSSED PROCEDURE, BENEFITS, & RISKS: < 1% chance of medication reaction, bleeding, perforation, or rupture of spleen/liver. NEEDS CBC WITH PREOP LABS.   FOLLOW UP IN 3 MOS.

## 2017-02-05 NOTE — Progress Notes (Signed)
Subjective:     Patient ID: Andrew Zhang, male   DOB: 01-09-1934, 83 y.o.   MRN: [id number] [name], MD  HPI PT HAVING OCCASIONAL BRBPR. RECENT TCS-RADIATION PROCTITIS. CONTINUES WITH NEED FOR pRBCs(5u SINCE DEC 2017). ASLO HAS CRI(GFR25), AND LOW JSHF(026V). STOOL ARE BLACK DUE TO PO IRON  PT DENIES FEVER, CHILLS, HEMATEMESIS, nausea, vomiting, melena, diarrhea, CHEST PAIN, SHORTNESS OF BREATH,  CHANGE IN BOWEL IN HABITS, constipation, abdominal pain, problems swallowing, problems with sedation, OR heartburn or indigestion.   Past Medical History:  Diagnosis Date  . AAA (abdominal aortic aneurysm) ([place])   . Arthritis   . B12 deficiency   . CAD (coronary artery disease)   . Cancer ([place])   . Depression   . Diverticulitis   . DMII (diabetes mellitus, type 2) ([place]) 2008  . GERD (gastroesophageal reflux disease)   . Hiatal hernia   . HTN (hypertension)   . Hyperlipidemia   . Prostate cancer [facility])     Past Surgical History:  Procedure Laterality Date  . APPENDECTOMY    . BACK SURGERY    . CHOLECYSTECTOMY    . COLONOSCOPY  03/2015   Dr. Britta Mccreedy: Diverticulosis, single tubular adenoma removed.  . CORONARY ARTERY BYPASS GRAFT    . DESCENDING AORTIC ANEURYSM REPAIR W/ STENT    . ESOPHAGOGASTRODUODENOSCOPY N/A 07/30/2016   Dr. Oneida Alar. Nonobstructing Schatzki ring at GE junction, multiple small sessile polyps with no bleeding or stigmata of recent bleeding in the gastric fundus and gastric body. Benign-appearing intrinsic moderate stenosis found the pylorus status post dilation. Small bowel capsule deployed.  Marland Kitchen GIVENS CAPSULE STUDY     Capsule study is complete to the cecum. No obvious lesions, mass, tumors. Small wisps of blood noted in small bowel secondary to EGD/dilation. Possible few erosions in setting of NSAIDs. No obvious areas of bleeding.    Allergies  Allergen Reactions  . Penicillins Rash   Current Outpatient Prescriptions  Medication Sig Dispense Refill  .  aspirin 81 MG chewable tablet Chew 81 mg by mouth daily.    . Calcium Carbonate-Vitamin D (CALTRATE 600+D) 600-400 MG-UNIT per tablet Take 1 tablet by mouth daily.    . Cyanocobalamin 1000 MCG/ML LIQD Take by mouth daily.    . ferrous sulfate 325 (65 FE) MG tablet Take 1 tablet (325 mg total) by mouth 2 (two) times daily with a meal. 60 tablet 0  . finasteride (PROSCAR) 5 MG tablet Take 5 mg by mouth daily.    Marland Kitchen glimepiride (AMARYL) 2 MG tablet Take 2 mg by mouth daily with breakfast.     . lisinopril (PRINIVIL,ZESTRIL) 2.5 MG tablet Take 2.5 mg by mouth daily.    . nitroGLYCERIN (NITROSTAT) 0.4 MG SL tablet Place 1 tablet (0.4 mg total) under the tongue every 5 (five) minutes as needed for chest pain. 25 tablet 3  . omeprazole (PRILOSEC) 20 MG capsule Take 1 capsule (20 mg total) by mouth 2 (two) times daily before a meal. 60 capsule 0  . sertraline (ZOLOFT) 100 MG tablet Take 50 mg by mouth daily.    . simvastatin (ZOCOR) 80 MG tablet Take 80 mg by mouth at bedtime.    . tamsulosin (FLOMAX) 0.4 MG CAPS Take 0.4 mg by mouth daily after supper.    . metFORMIN (GLUCOPHAGE) 500 MG tablet Take 500 mg by mouth every evening.     No current facility-administered medications for this visit.       Review of Systems PER HPI OTHERWISE  ALL SYSTEMS ARE NEGATIVE.    Objective:   Physical Exam  Constitutional: He is oriented to person, place, and time. He appears well-developed and well-nourished. No distress.  HENT:  Head: Normocephalic and atraumatic.  Mouth/Throat: Oropharynx is clear and moist. No oropharyngeal exudate.  Eyes: Pupils are equal, round, and reactive to light. No scleral icterus.  Neck: Normal range of motion. Neck supple.  Cardiovascular: Normal rate, regular rhythm and normal heart sounds.   Pulmonary/Chest: Effort normal and breath sounds normal. No respiratory distress.  Abdominal: Soft. Bowel sounds are normal. He exhibits no distension. There is no tenderness.   Musculoskeletal: He exhibits no edema.  Lymphadenopathy:    He has no cervical adenopathy.  Neurological: He is alert and oriented to person, place, and time.  NO  NEW FOCAL DEFICITS  Psychiatric:  SLIGHTLY ANXIOUS MOOD, FLAT AFFECT   Vitals reviewed.     Assessment:         Plan:

## 2017-02-05 NOTE — Assessment & Plan Note (Addendum)
WITH OCCASIONAL HEMATOCHEZIA AND A TRANSFUSION DEPENDENT. ANEMIA PERSISTS IN SPITE OF 5u p RBCs SINCE DEC 2017, IV FE AND VIT B12. ANEMIA IS MULTIFACTORIAL-ASA USE, RADIATION PROCTITIS, AND COMPLICATED BY CRI AND LOW PLTS.  CONTINUE ASPIRIN.  AVOID NSAIDs. LEFT MESSAGE WITH DR. Theador Hawthorne. I THINK PT COULD BENEFIT FROM PROCRIT. FOLLOW UP WITH HEMATOLOGY. REPEAT COLONOSCOPY  W/ MAC IN 3 WEEKS AND WE WILL CAUTERIZE ANY SUPERFICIAL VESSELS IN YOUR RECTUM. DISCUSSED PROCEDURE, BENEFITS, & RISKS: < 1% chance of medication reaction, bleeding, perforation, or rupture of spleen/liver. NEEDS CBC WITH PREOP LABS.   FOLLOW UP IN 3 MOS.

## 2017-02-06 ENCOUNTER — Telehealth: Payer: Self-pay

## 2017-02-06 DIAGNOSIS — F329 Major depressive disorder, single episode, unspecified: Secondary | ICD-10-CM | POA: Diagnosis not present

## 2017-02-06 DIAGNOSIS — Z880 Allergy status to penicillin: Secondary | ICD-10-CM | POA: Diagnosis not present

## 2017-02-06 DIAGNOSIS — Z79899 Other long term (current) drug therapy: Secondary | ICD-10-CM | POA: Diagnosis not present

## 2017-02-06 DIAGNOSIS — I10 Essential (primary) hypertension: Secondary | ICD-10-CM | POA: Diagnosis not present

## 2017-02-06 DIAGNOSIS — E119 Type 2 diabetes mellitus without complications: Secondary | ICD-10-CM | POA: Diagnosis not present

## 2017-02-06 DIAGNOSIS — Z8546 Personal history of malignant neoplasm of prostate: Secondary | ICD-10-CM | POA: Diagnosis not present

## 2017-02-06 DIAGNOSIS — N131 Hydronephrosis with ureteral stricture, not elsewhere classified: Secondary | ICD-10-CM | POA: Diagnosis not present

## 2017-02-06 DIAGNOSIS — E118 Type 2 diabetes mellitus with unspecified complications: Secondary | ICD-10-CM | POA: Diagnosis not present

## 2017-02-06 DIAGNOSIS — E538 Deficiency of other specified B group vitamins: Secondary | ICD-10-CM | POA: Diagnosis not present

## 2017-02-06 DIAGNOSIS — M1990 Unspecified osteoarthritis, unspecified site: Secondary | ICD-10-CM | POA: Diagnosis not present

## 2017-02-06 DIAGNOSIS — N135 Crossing vessel and stricture of ureter without hydronephrosis: Secondary | ICD-10-CM | POA: Diagnosis not present

## 2017-02-06 DIAGNOSIS — Z7982 Long term (current) use of aspirin: Secondary | ICD-10-CM | POA: Diagnosis not present

## 2017-02-06 DIAGNOSIS — Z87891 Personal history of nicotine dependence: Secondary | ICD-10-CM | POA: Diagnosis not present

## 2017-02-06 DIAGNOSIS — K219 Gastro-esophageal reflux disease without esophagitis: Secondary | ICD-10-CM | POA: Diagnosis not present

## 2017-02-06 DIAGNOSIS — Z951 Presence of aortocoronary bypass graft: Secondary | ICD-10-CM | POA: Diagnosis not present

## 2017-02-06 DIAGNOSIS — N1330 Unspecified hydronephrosis: Secondary | ICD-10-CM | POA: Diagnosis not present

## 2017-02-06 DIAGNOSIS — Z7984 Long term (current) use of oral hypoglycemic drugs: Secondary | ICD-10-CM | POA: Diagnosis not present

## 2017-02-06 DIAGNOSIS — G4730 Sleep apnea, unspecified: Secondary | ICD-10-CM | POA: Diagnosis not present

## 2017-02-06 DIAGNOSIS — Z466 Encounter for fitting and adjustment of urinary device: Secondary | ICD-10-CM | POA: Diagnosis not present

## 2017-02-06 DIAGNOSIS — Z881 Allergy status to other antibiotic agents status: Secondary | ICD-10-CM | POA: Diagnosis not present

## 2017-02-06 DIAGNOSIS — E785 Hyperlipidemia, unspecified: Secondary | ICD-10-CM | POA: Diagnosis not present

## 2017-02-06 DIAGNOSIS — Z808 Family history of malignant neoplasm of other organs or systems: Secondary | ICD-10-CM | POA: Diagnosis not present

## 2017-02-06 DIAGNOSIS — Z923 Personal history of irradiation: Secondary | ICD-10-CM | POA: Diagnosis not present

## 2017-02-06 DIAGNOSIS — N132 Hydronephrosis with renal and ureteral calculous obstruction: Secondary | ICD-10-CM | POA: Diagnosis not present

## 2017-02-06 DIAGNOSIS — I519 Heart disease, unspecified: Secondary | ICD-10-CM | POA: Diagnosis not present

## 2017-02-06 NOTE — Telephone Encounter (Signed)
LMOVM and informed pt of pre-op appt 02/20/17 at 10:00am. Letter mailed.

## 2017-02-06 NOTE — Progress Notes (Signed)
cc'ed to pcp °

## 2017-02-06 NOTE — Progress Notes (Signed)
ON RECALL  °

## 2017-02-10 DIAGNOSIS — C61 Malignant neoplasm of prostate: Secondary | ICD-10-CM | POA: Diagnosis not present

## 2017-02-10 DIAGNOSIS — N139 Obstructive and reflux uropathy, unspecified: Secondary | ICD-10-CM | POA: Diagnosis not present

## 2017-02-19 NOTE — Patient Instructions (Signed)
Andrew Zhang  02/19/2017     @PREFPERIOPPHARMACY @   Your procedure is scheduled on  02/25/2017   Report to [facility] at  800  A.M.  Call this number if you have problems the morning of surgery:  [phone]   Remember:  Do not eat food or drink liquids after midnight.  Take these medicines the morning of surgery with A SIP OF WATER  Proscar, lisinopril, prilosec, zoloft, flomax. DO NOT take any medications for diabetes the morning of your procedure.   Do not wear jewelry, make-up or nail polish.  Do not wear lotions, powders, or perfumes, or deoderant.  Do not shave 48 hours prior to surgery.  Men may shave face and neck.  Do not bring valuables to the hospital.  [facility] is not responsible for any belongings or valuables.  Contacts, dentures or bridgework may not be worn into surgery.  Leave your suitcase in the car.  After surgery it may be brought to your room.  For patients admitted to the hospital, discharge time will be determined by your treatment team.  Patients discharged the day of surgery will not be allowed to drive home.   Name and phone number of your driver:   family Special instructions:  Follow the diet and prep instructions given to you by Dr Nona Dell office.  Please read over the following fact sheets that you were given. Anesthesia Post-op Instructions and Care and Recovery After Surgery       Colonoscopy, Adult A colonoscopy is an exam to look at the entire large intestine. During the exam, a lubricated, bendable tube is inserted into the anus and then passed into the rectum, colon, and other parts of the large intestine. A colonoscopy is often done as a part of normal colorectal screening or in response to certain symptoms, such as anemia, persistent diarrhea, abdominal pain, and blood in the stool. The exam can help screen for and diagnose medical problems, including:  Tumors.  Polyps.  Inflammation.  Areas of  bleeding. Tell a health care provider about:  Any allergies you have.  All medicines you are taking, including vitamins, herbs, eye drops, creams, and over-the-counter medicines.  Any problems you or family members have had with anesthetic medicines.  Any blood disorders you have.  Any surgeries you have had.  Any medical conditions you have.  Any problems you have had passing stool. What are the risks? Generally, this is a safe procedure. However, problems may occur, including:  Bleeding.  A tear in the intestine.  A reaction to medicines given during the exam.  Infection (rare). What happens before the procedure? Eating and drinking restrictions  Follow instructions from your health care provider about eating and drinking, which may include:  A few days before the procedure - follow a low-fiber diet. Avoid nuts, seeds, dried fruit, raw fruits, and vegetables.  1-3 days before the procedure - follow a clear liquid diet. Drink only clear liquids, such as clear broth or bouillon, black coffee or tea, clear juice, clear soft drinks or sports drinks, gelatin desert, and popsicles. Avoid any liquids that contain red or purple dye.  On the day of the procedure - do not eat or drink anything during the 2 hours before the procedure, or within the time period that your health care provider recommends. Bowel prep  If you were prescribed an oral bowel prep to clean out your colon:  Take it  as told by your health care provider. Starting the day before your procedure, you will need to drink a large amount of medicated liquid. The liquid will cause you to have multiple loose stools until your stool is almost clear or light green.  If your skin or anus gets irritated from diarrhea, you may use these to relieve the irritation:  Medicated wipes, such as adult wet wipes with aloe and vitamin E.  A skin soothing-product like petroleum jelly.  If you vomit while drinking the bowel prep,  take a break for up to 60 minutes and then begin the bowel prep again. If vomiting continues and you cannot take the bowel prep without vomiting, call your health care provider. General instructions  Ask your health care provider about changing or stopping your regular medicines. This is especially important if you are taking diabetes medicines or blood thinners.  Plan to have someone take you home from the hospital or clinic. What happens during the procedure?  An IV tube may be inserted into one of your veins.  You will be given medicine to help you relax (sedative).  To reduce your risk of infection:  Your health care team will wash or sanitize their hands.  Your anal area will be washed with soap.  You will be asked to lie on your side with your knees bent.  Your health care provider will lubricate a long, thin, flexible tube. The tube will have a camera and a light on the end.  The tube will be inserted into your anus.  The tube will be gently eased through your rectum and colon.  Air will be delivered into your colon to keep it open. You may feel some pressure or cramping.  The camera will be used to take images during the procedure.  A small tissue sample may be removed from your body to be examined under a microscope (biopsy). If any potential problems are found, the tissue will be sent to a lab for testing.  If small polyps are found, your health care provider may remove them and have them checked for cancer cells.  The tube that was inserted into your anus will be slowly removed. The procedure may vary among health care providers and hospitals. What happens after the procedure?  Your blood pressure, heart rate, breathing rate, and blood oxygen level will be monitored until the medicines you were given have worn off.  Do not drive for 24 hours after the exam.  You may have a small amount of blood in your stool.  You may pass gas and have mild abdominal cramping or  bloating due to the air that was used to inflate your colon during the exam.  It is up to you to get the results of your procedure. Ask your health care provider, or the department performing the procedure, when your results will be ready. This information is not intended to replace advice given to you by your health care provider. Make sure you discuss any questions you have with your health care provider. Document Released: 12/13/2000 Document Revised: 07/05/2016 Document Reviewed: 02/27/2016 Elsevier Interactive Patient Education  2017 [employer]  Colonoscopy, Adult, Care After This sheet gives you information about how to care for yourself after your procedure. Your health care provider may also give you more specific instructions. If you have problems or questions, contact your health care provider. What can I expect after the procedure? After the procedure, it is common to have:  A small amount  of blood in your stool for 24 hours after the procedure.  Some gas.  Mild abdominal cramping or bloating. Follow these instructions at home: General instructions  For the first 24 hours after the procedure:  Do not drive or use machinery.  Do not sign important documents.  Do not drink alcohol.  Do your regular daily activities at a slower pace than normal.  Eat soft, easy-to-digest foods.  Rest often.  Take over-the-counter or prescription medicines only as told by your health care provider.  It is up to you to get the results of your procedure. Ask your health care provider, or the department performing the procedure, when your results will be ready. Relieving cramping and bloating  Try walking around when you have cramps or feel bloated.  Apply heat to your abdomen as told by your health care provider. Use a heat source that your health care provider recommends, such as a moist heat pack or a heating pad.  Place a towel between your skin and the heat source.  Leave the  heat on for 20-30 minutes.  Remove the heat if your skin turns bright red. This is especially important if you are unable to feel pain, heat, or cold. You may have a greater risk of getting burned. Eating and drinking  Drink enough fluid to keep your urine clear or pale yellow.  Resume your normal diet as instructed by your health care provider. Avoid heavy or fried foods that are hard to digest.  Avoid drinking alcohol for as long as instructed by your health care provider. Contact a health care provider if:  You have blood in your stool 2-3 days after the procedure. Get help right away if:  You have more than a small spotting of blood in your stool.  You pass large blood clots in your stool.  Your abdomen is swollen.  You have nausea or vomiting.  You have a fever.  You have increasing abdominal pain that is not relieved with medicine. This information is not intended to replace advice given to you by your health care provider. Make sure you discuss any questions you have with your health care provider. Document Released: 07/30/2004 Document Revised: 09/09/2016 Document Reviewed: 02/27/2016 Elsevier Interactive Patient Education  2017 [place] Anesthesia is a term that refers to techniques, procedures, and medicines that help a person stay safe and comfortable during a medical procedure. Monitored anesthesia care, or sedation, is one type of anesthesia. Your anesthesia specialist may recommend sedation if you will be having a procedure that does not require you to be unconscious, such as:  Cataract surgery.  A dental procedure.  A biopsy.  A colonoscopy. During the procedure, you may receive a medicine to help you relax (sedative). There are three levels of sedation:  Mild sedation. At this level, you may feel awake and relaxed. You will be able to follow directions.  Moderate sedation. At this level, you will be sleepy. You may not  remember the procedure.  Deep sedation. At this level, you will be asleep. You will not remember the procedure. The more medicine you are given, the deeper your level of sedation will be. Depending on how you respond to the procedure, the anesthesia specialist may change your level of sedation or the type of anesthesia to fit your needs. An anesthesia specialist will monitor you closely during the procedure. Let your health care provider know about:  Any allergies you have.  All medicines you are  taking, including vitamins, herbs, eye drops, creams, and over-the-counter medicines.  Any use of steroids (by mouth or as a cream).  Any problems you or family members have had with sedatives and anesthetic medicines.  Any blood disorders you have.  Any surgeries you have had.  Any medical conditions you have, such as sleep apnea.  Whether you are pregnant or may be pregnant.  Any use of cigarettes, alcohol, or street drugs. What are the risks? Generally, this is a safe procedure. However, problems may occur, including:  Getting too much medicine (oversedation).  Nausea.  Allergic reaction to medicines.  Trouble breathing. If this happens, a breathing tube may be used to help with breathing. It will be removed when you are awake and breathing on your own.  Heart trouble.  Lung trouble. Before the procedure Staying hydrated  Follow instructions from your health care provider about hydration, which may include:  Up to 2 hours before the procedure - you may continue to drink clear liquids, such as water, clear fruit juice, black coffee, and plain tea. Eating and drinking restrictions  Follow instructions from your health care provider about eating and drinking, which may include:  8 hours before the procedure - stop eating heavy meals or foods such as meat, fried foods, or fatty foods.  6 hours before the procedure - stop eating light meals or foods, such as toast or cereal.  6  hours before the procedure - stop drinking milk or drinks that contain milk.  2 hours before the procedure - stop drinking clear liquids. Medicines  Ask your health care provider about:  Changing or stopping your regular medicines. This is especially important if you are taking diabetes medicines or blood thinners.  Taking medicines such as aspirin and ibuprofen. These medicines can thin your blood. Do not take these medicines before your procedure if your health care provider instructs you not to. Tests and exams  You will have a physical exam.  You may have blood tests done to show:  How well your kidneys and liver are working.  How well your blood can clot.  General instructions  Plan to have someone take you home from the hospital or clinic.  If you will be going home right after the procedure, plan to have someone with you for 24 hours. What happens during the procedure?  Your blood pressure, heart rate, breathing, level of pain and overall condition will be monitored.  An IV tube will be inserted into one of your veins.  Your anesthesia specialist will give you medicines as needed to keep you comfortable during the procedure. This may mean changing the level of sedation.  The procedure will be performed. After the procedure  Your blood pressure, heart rate, breathing rate, and blood oxygen level will be monitored until the medicines you were given have worn off.  Do not drive for 24 hours if you received a sedative.  You may:  Feel sleepy, clumsy, or nauseous.  Feel forgetful about what happened after the procedure.  Have a sore throat if you had a breathing tube during the procedure.  Vomit. This information is not intended to replace advice given to you by your health care provider. Make sure you discuss any questions you have with your health care provider. Document Released: 09/11/2005 Document Revised: 05/24/2016 Document Reviewed: 04/07/2016 Elsevier  Interactive Patient Education  2017 [place], Care After These instructions provide you with information about caring for yourself after your procedure.  Your health care provider may also give you more specific instructions. Your treatment has been planned according to current medical practices, but problems sometimes occur. Call your health care provider if you have any problems or questions after your procedure. What can I expect after the procedure? After your procedure, it is common to:  Feel sleepy for several hours.  Feel clumsy and have poor balance for several hours.  Feel forgetful about what happened after the procedure.  Have poor judgment for several hours.  Feel nauseous or vomit.  Have a sore throat if you had a breathing tube during the procedure. Follow these instructions at home: For at least 24 hours after the procedure:   Do not:  Participate in activities in which you could fall or become injured.  Drive.  Use heavy machinery.  Drink alcohol.  Take sleeping pills or medicines that cause drowsiness.  Make important decisions or sign legal documents.  Take care of children on your own.  Rest. Eating and drinking  Follow the diet that is recommended by your health care provider.  If you vomit, drink water, juice, or soup when you can drink without vomiting.  Make sure you have little or no nausea before eating solid foods. General instructions  Have a responsible adult stay with you until you are awake and alert.  Take over-the-counter and prescription medicines only as told by your health care provider.  If you smoke, do not smoke without supervision.  Keep all follow-up visits as told by your health care provider. This is important. Contact a health care provider if:  You keep feeling nauseous or you keep vomiting.  You feel light-headed.  You develop a rash.  You have a fever. Get help right away  if:  You have trouble breathing. This information is not intended to replace advice given to you by your health care provider. Make sure you discuss any questions you have with your health care provider. Document Released: 04/07/2016 Document Revised: 08/07/2016 Document Reviewed: 04/07/2016 Elsevier Interactive Patient Education  2017 [employer].

## 2017-02-20 ENCOUNTER — Encounter (HOSPITAL_COMMUNITY): Payer: Self-pay

## 2017-02-20 ENCOUNTER — Encounter (HOSPITAL_COMMUNITY)
Admission: RE | Admit: 2017-02-20 | Discharge: 2017-02-20 | Disposition: A | Discharge status: Home / Self Care | Payer: Medicare Other | Source: Ambulatory Visit | Attending: Gastroenterology | Admitting: Gastroenterology

## 2017-02-20 ENCOUNTER — Other Ambulatory Visit: Payer: Self-pay

## 2017-02-20 DIAGNOSIS — Z01818 Encounter for other preprocedural examination: Secondary | ICD-10-CM | POA: Insufficient documentation

## 2017-02-20 DIAGNOSIS — Z01810 Encounter for preprocedural cardiovascular examination: Secondary | ICD-10-CM | POA: Diagnosis not present

## 2017-02-20 DIAGNOSIS — I440 Atrioventricular block, first degree: Secondary | ICD-10-CM | POA: Insufficient documentation

## 2017-02-20 DIAGNOSIS — I447 Left bundle-branch block, unspecified: Secondary | ICD-10-CM | POA: Insufficient documentation

## 2017-02-20 DIAGNOSIS — K625 Hemorrhage of anus and rectum: Secondary | ICD-10-CM | POA: Diagnosis not present

## 2017-02-20 HISTORY — DX: Anemia, unspecified: D64.9

## 2017-02-20 HISTORY — DX: Sleep apnea, unspecified: G47.30

## 2017-02-20 HISTORY — DX: Adverse effect of unspecified anesthetic, initial encounter: T41.45XA

## 2017-02-20 HISTORY — DX: Personal history of urinary calculi: Z87.442

## 2017-02-20 HISTORY — DX: Other complications of anesthesia, initial encounter: T88.59XA

## 2017-02-20 HISTORY — DX: Other specified postprocedural states: R11.2

## 2017-02-20 HISTORY — DX: Other specified postprocedural states: Z98.890

## 2017-02-20 LAB — BASIC METABOLIC PANEL
ANION GAP: 5 (ref 5–15)
BUN: 62 mg/dL — ABNORMAL HIGH (ref 6–20)
CALCIUM: 9 mg/dL (ref 8.9–10.3)
CO2: 23 mmol/L (ref 22–32)
CREATININE: 2.32 mg/dL — AB (ref 0.61–1.24)
Chloride: 111 mmol/L (ref 101–111)
GFR calc non Af Amer: 25 mL/min — ABNORMAL LOW (ref >60)
GFR, EST AFRICAN AMERICAN: 29 mL/min — AB (ref >60)
Glucose, Bld: 167 mg/dL — ABNORMAL HIGH (ref 65–99)
Potassium: 5.1 mmol/L (ref 3.5–5.1)
SODIUM: 139 mmol/L (ref 135–145)

## 2017-02-20 LAB — CBC WITH DIFFERENTIAL/PLATELET
BASOS ABS: 0 10*3/uL (ref 0.0–0.1)
BASOS PCT: 0 %
EOS ABS: 0.2 10*3/uL (ref 0.0–0.7)
EOS PCT: 4 %
HEMATOCRIT: 21.9 % — AB (ref 39.0–52.0)
Hemoglobin: 7.4 g/dL — ABNORMAL LOW (ref 13.0–17.0)
Lymphocytes Relative: 20 %
Lymphs Abs: 0.8 10*3/uL (ref 0.7–4.0)
MCH: 32.9 pg (ref 26.0–34.0)
MCHC: 33.8 g/dL (ref 30.0–36.0)
MCV: 97.3 fL (ref 78.0–100.0)
MONO ABS: 0.3 10*3/uL (ref 0.1–1.0)
MONOS PCT: 6 %
NEUTROS ABS: 2.7 10*3/uL (ref 1.7–7.7)
Neutrophils Relative %: 70 %
PLATELETS: 130 10*3/uL — AB (ref 150–400)
RBC: 2.25 MIL/uL — ABNORMAL LOW (ref 4.22–5.81)
RDW: 15.3 % (ref 11.5–15.5)
WBC: 3.9 10*3/uL — ABNORMAL LOW (ref 4.0–10.5)

## 2017-02-21 ENCOUNTER — Encounter (HOSPITAL_COMMUNITY)
Admission: RE | Admit: 2017-02-21 | Discharge: 2017-02-21 | Disposition: A | Discharge status: Home / Self Care | Payer: Medicare Other | Source: Ambulatory Visit | Attending: Gastroenterology | Admitting: Gastroenterology

## 2017-02-21 ENCOUNTER — Telehealth: Payer: Self-pay | Admitting: Gastroenterology

## 2017-02-21 DIAGNOSIS — D6489 Other specified anemias: Secondary | ICD-10-CM | POA: Insufficient documentation

## 2017-02-21 LAB — PREPARE RBC (CROSSMATCH)

## 2017-02-21 NOTE — Telephone Encounter (Signed)
PLEASE CALL PT. HIS Hb IS 7.4. HE NEEDS A TYPE AND CROSS AND TRANSFUSE 1u pRBC ON MON FEB 26.

## 2017-02-21 NOTE — Pre-Procedure Instructions (Signed)
H&H 704/ 21.9 shown to Dr Patsey Berthold as well as history of iron infusions and blood transfusions. He wants CBC repeated the morning of surgery. Dr Nona Dell also aware.

## 2017-02-21 NOTE — Telephone Encounter (Signed)
REVIEWED-NO ADDITIONAL RECOMMENDATIONS. 

## 2017-02-21 NOTE — Telephone Encounter (Signed)
I called and informed pt and he is aware someone from [facility] will be calling him to schedule the Type and Cross and the transfusion.   I have faxed paperwork to [facility] to have Dr. Oneida Alar complete and sign.

## 2017-02-24 ENCOUNTER — Encounter (HOSPITAL_COMMUNITY)
Admission: RE | Admit: 2017-02-24 | Discharge: 2017-02-24 | Disposition: A | Discharge status: Home / Self Care | Payer: Medicare Other | Source: Ambulatory Visit | Attending: Gastroenterology | Admitting: Gastroenterology

## 2017-02-24 DIAGNOSIS — D6489 Other specified anemias: Secondary | ICD-10-CM | POA: Diagnosis not present

## 2017-02-24 LAB — HEMOGLOBIN AND HEMATOCRIT, BLOOD
HEMATOCRIT: 25.2 % — AB (ref 39.0–52.0)
Hemoglobin: 8.4 g/dL — ABNORMAL LOW (ref 13.0–17.0)

## 2017-02-24 MED ORDER — SODIUM CHLORIDE 0.9 % IV SOLN
Freq: Once | INTRAVENOUS | Status: AC
  Administered 2017-02-24: 250 mL via INTRAVENOUS

## 2017-02-24 MED ORDER — ACETAMINOPHEN 325 MG PO TABS
ORAL_TABLET | ORAL | Status: AC
  Filled 2017-02-24: qty 2

## 2017-02-24 MED ORDER — ACETAMINOPHEN 325 MG PO TABS
650.0000 mg | ORAL_TABLET | Freq: Once | ORAL | Status: AC
  Administered 2017-02-24: 650 mg via ORAL

## 2017-02-24 MED ORDER — DIPHENHYDRAMINE HCL 25 MG PO CAPS
ORAL_CAPSULE | ORAL | Status: AC
  Filled 2017-02-24: qty 1

## 2017-02-24 MED ORDER — DIPHENHYDRAMINE HCL 25 MG PO TABS
25.0000 mg | ORAL_TABLET | Freq: Once | ORAL | Status: AC
  Administered 2017-02-24: 25 mg via ORAL
  Filled 2017-02-24: qty 1

## 2017-02-25 ENCOUNTER — Ambulatory Visit (HOSPITAL_COMMUNITY): Payer: Medicare Other | Admitting: Anesthesiology

## 2017-02-25 ENCOUNTER — Telehealth: Payer: Self-pay | Admitting: Gastroenterology

## 2017-02-25 ENCOUNTER — Ambulatory Visit (HOSPITAL_COMMUNITY)
Admission: RE | Admit: 2017-02-25 | Discharge: 2017-02-25 | Disposition: A | Discharge status: Home / Self Care | Payer: Medicare Other | Source: Ambulatory Visit | Attending: Gastroenterology | Admitting: Gastroenterology

## 2017-02-25 ENCOUNTER — Encounter (HOSPITAL_COMMUNITY): Payer: Self-pay | Admitting: *Deleted

## 2017-02-25 ENCOUNTER — Encounter (HOSPITAL_COMMUNITY)
Admission: RE | Disposition: A | Discharge status: Home / Self Care | Payer: Self-pay | Source: Ambulatory Visit | Attending: Gastroenterology

## 2017-02-25 DIAGNOSIS — K625 Hemorrhage of anus and rectum: Secondary | ICD-10-CM

## 2017-02-25 DIAGNOSIS — Z8546 Personal history of malignant neoplasm of prostate: Secondary | ICD-10-CM | POA: Diagnosis not present

## 2017-02-25 DIAGNOSIS — K627 Radiation proctitis: Secondary | ICD-10-CM | POA: Diagnosis not present

## 2017-02-25 DIAGNOSIS — E785 Hyperlipidemia, unspecified: Secondary | ICD-10-CM | POA: Insufficient documentation

## 2017-02-25 DIAGNOSIS — I129 Hypertensive chronic kidney disease with stage 1 through stage 4 chronic kidney disease, or unspecified chronic kidney disease: Secondary | ICD-10-CM | POA: Insufficient documentation

## 2017-02-25 DIAGNOSIS — Z880 Allergy status to penicillin: Secondary | ICD-10-CM | POA: Diagnosis not present

## 2017-02-25 DIAGNOSIS — Z7982 Long term (current) use of aspirin: Secondary | ICD-10-CM | POA: Insufficient documentation

## 2017-02-25 DIAGNOSIS — K5730 Diverticulosis of large intestine without perforation or abscess without bleeding: Secondary | ICD-10-CM | POA: Diagnosis not present

## 2017-02-25 DIAGNOSIS — I2510 Atherosclerotic heart disease of native coronary artery without angina pectoris: Secondary | ICD-10-CM | POA: Diagnosis not present

## 2017-02-25 DIAGNOSIS — F329 Major depressive disorder, single episode, unspecified: Secondary | ICD-10-CM | POA: Insufficient documentation

## 2017-02-25 DIAGNOSIS — E1122 Type 2 diabetes mellitus with diabetic chronic kidney disease: Secondary | ICD-10-CM | POA: Insufficient documentation

## 2017-02-25 DIAGNOSIS — N189 Chronic kidney disease, unspecified: Secondary | ICD-10-CM | POA: Diagnosis not present

## 2017-02-25 DIAGNOSIS — K219 Gastro-esophageal reflux disease without esophagitis: Secondary | ICD-10-CM | POA: Insufficient documentation

## 2017-02-25 DIAGNOSIS — K644 Residual hemorrhoidal skin tags: Secondary | ICD-10-CM | POA: Insufficient documentation

## 2017-02-25 DIAGNOSIS — Z7984 Long term (current) use of oral hypoglycemic drugs: Secondary | ICD-10-CM | POA: Insufficient documentation

## 2017-02-25 DIAGNOSIS — Z87891 Personal history of nicotine dependence: Secondary | ICD-10-CM | POA: Diagnosis not present

## 2017-02-25 DIAGNOSIS — Z79899 Other long term (current) drug therapy: Secondary | ICD-10-CM | POA: Insufficient documentation

## 2017-02-25 DIAGNOSIS — D62 Acute posthemorrhagic anemia: Secondary | ICD-10-CM

## 2017-02-25 DIAGNOSIS — D500 Iron deficiency anemia secondary to blood loss (chronic): Secondary | ICD-10-CM | POA: Diagnosis not present

## 2017-02-25 DIAGNOSIS — E1151 Type 2 diabetes mellitus with diabetic peripheral angiopathy without gangrene: Secondary | ICD-10-CM | POA: Insufficient documentation

## 2017-02-25 DIAGNOSIS — Z951 Presence of aortocoronary bypass graft: Secondary | ICD-10-CM | POA: Insufficient documentation

## 2017-02-25 DIAGNOSIS — R001 Bradycardia, unspecified: Secondary | ICD-10-CM | POA: Insufficient documentation

## 2017-02-25 DIAGNOSIS — D539 Nutritional anemia, unspecified: Secondary | ICD-10-CM | POA: Diagnosis not present

## 2017-02-25 HISTORY — PX: COLONOSCOPY WITH PROPOFOL: SHX5780

## 2017-02-25 LAB — TYPE AND SCREEN
ABO/RH(D): B POS
ANTIBODY SCREEN: NEGATIVE
UNIT DIVISION: 0

## 2017-02-25 LAB — GLUCOSE, CAPILLARY
GLUCOSE-CAPILLARY: 116 mg/dL — AB (ref 65–99)
Glucose-Capillary: 104 mg/dL — ABNORMAL HIGH (ref 65–99)

## 2017-02-25 SURGERY — COLONOSCOPY WITH PROPOFOL
Anesthesia: Monitor Anesthesia Care

## 2017-02-25 MED ORDER — LIDOCAINE HCL (PF) 1 % IJ SOLN
INTRAMUSCULAR | Status: AC
  Filled 2017-02-25: qty 5

## 2017-02-25 MED ORDER — LIDOCAINE HCL (CARDIAC) 10 MG/ML IV SOLN
INTRAVENOUS | Status: DC | PRN
  Administered 2017-02-25: 50 mg via INTRAVENOUS

## 2017-02-25 MED ORDER — CHLORHEXIDINE GLUCONATE CLOTH 2 % EX PADS: 6.0000 | MEDICATED_PAD | Freq: Once | CUTANEOUS | Status: DC

## 2017-02-25 MED ORDER — FENTANYL CITRATE (PF) 100 MCG/2ML IJ SOLN
25.0000 ug | INTRAMUSCULAR | Status: AC
  Administered 2017-02-25: 25 ug via INTRAVENOUS

## 2017-02-25 MED ORDER — ATROPINE SULFATE 0.4 MG/ML IJ SOLN
INTRAMUSCULAR | Status: DC | PRN
  Administered 2017-02-25: 0.2 mg via INTRAVENOUS

## 2017-02-25 MED ORDER — MIDAZOLAM HCL 2 MG/2ML IJ SOLN
1.0000 mg | INTRAMUSCULAR | Status: AC
  Administered 2017-02-25: 1 mg via INTRAVENOUS

## 2017-02-25 MED ORDER — LACTATED RINGERS IV SOLN
INTRAVENOUS | Status: DC
  Administered 2017-02-25: 09:00:00 via INTRAVENOUS

## 2017-02-25 MED ORDER — GLYCOPYRROLATE 0.2 MG/ML IJ SOLN
INTRAMUSCULAR | Status: AC
  Filled 2017-02-25: qty 1

## 2017-02-25 MED ORDER — GLYCOPYRROLATE 0.2 MG/ML IJ SOLN
INTRAMUSCULAR | Status: DC | PRN
  Administered 2017-02-25: 0.2 mg via INTRAVENOUS

## 2017-02-25 MED ORDER — FENTANYL CITRATE (PF) 100 MCG/2ML IJ SOLN
INTRAMUSCULAR | Status: AC
  Filled 2017-02-25: qty 2

## 2017-02-25 MED ORDER — PROPOFOL 500 MG/50ML IV EMUL
INTRAVENOUS | Status: DC | PRN
  Administered 2017-02-25: 40 ug/kg/min via INTRAVENOUS
  Administered 2017-02-25: 25 ug/kg/min via INTRAVENOUS

## 2017-02-25 MED ORDER — MIDAZOLAM HCL 2 MG/2ML IJ SOLN
INTRAMUSCULAR | Status: AC
  Filled 2017-02-25: qty 2

## 2017-02-25 MED ORDER — PROPOFOL 10 MG/ML IV BOLUS
INTRAVENOUS | Status: DC | PRN
  Administered 2017-02-25 (×3): 20 mg via INTRAVENOUS

## 2017-02-25 MED ORDER — ATROPINE SULFATE 0.4 MG/ML IJ SOLN
INTRAMUSCULAR | Status: AC
  Filled 2017-02-25: qty 1

## 2017-02-25 MED ORDER — STERILE WATER FOR IRRIGATION IR SOLN
Status: DC | PRN
  Administered 2017-02-25: 100 mL

## 2017-02-25 NOTE — Transfer of Care (Deleted)
Immediate Anesthesia Transfer of Care Note  Patient: Andrew Zhang  Procedure(s) Performed: Procedure(s) with comments: COLONOSCOPY WITH PROPOFOL (N/A) - 9:30am  Patient Location: PACU  Anesthesia Type:MAC  Level of Consciousness: awake and patient cooperative  Airway & Oxygen Therapy: Patient Spontanous Breathing and Patient connected to nasal cannula oxygen  Post-op Assessment: Report given to RN and Post -op Vital signs reviewed and stable  Post vital signs: Reviewed and stable  Last Vitals:  Vitals:   02/25/17 0757  BP: (!) 151/71  Pulse: 69  Resp: (!) 22  Temp: 36.4 C    Last Pain:  Vitals:   02/25/17 0757  TempSrc: Oral         Complications: No apparent anesthesia complications

## 2017-02-25 NOTE — Progress Notes (Signed)
Results for Andrew Zhang, Andrew Zhang (MRN [id number]) as of 02/25/2017 09:34  Ref. Range 02/24/2017 10:47  Hemoglobin Latest Ref Range: 13.0 - 17.0 g/dL 8.4 (L)  HCT Latest Ref Range: 39.0 - 52.0 % 25.2 (L)  After 1 unit of PRBC's.

## 2017-02-25 NOTE — Telephone Encounter (Signed)
I spoke with the patient about this. WITH MR. LILIES. DISCUSSED CASE. WILL START PROCRIT PRIOR TO NEXT OPV MAR 7.

## 2017-02-25 NOTE — Transfer of Care (Signed)
Immediate Anesthesia Transfer of Care Note  Patient: Andrew Zhang  Procedure(s) Performed: Procedure(s) with comments: COLONOSCOPY WITH PROPOFOL (N/A) - 9:30am  Patient Location: PACU  Anesthesia Type:MAC  Level of Consciousness: awake and patient cooperative  Airway & Oxygen Therapy: Patient Spontanous Breathing and Patient connected to nasal cannula oxygen  Post-op Assessment: Report given to RN and Post -op Vital signs reviewed and stable  Post vital signs: Reviewed  Last Vitals:  Vitals:   02/25/17 0757  BP: (!) 151/71  Pulse: 69  Resp: (!) 22  Temp: 36.4 C    Last Pain:  Vitals:   02/25/17 0757  TempSrc: Oral         Complications: No apparent anesthesia complications

## 2017-02-25 NOTE — Op Note (Signed)
[facility] Patient Name: Andrew Zhang Procedure Date: 02/25/2017 9:17 AM MRN: [id number] Date of Birth: 1934-02-26 Attending MD: Barney Drain , MD CSN: [id number] Age: 83 Admit Type: Outpatient Procedure:                Colonoscopy WITH ARGON PLASMA CAUTERY Indications:              Iron deficiency anemia secondary to chronic blood                            loss-TCS AUG 2016: ONE SIMPLE ADENOMA, EGD AUG 2017                            SCHATZKI'S RING, GIVENS AUG 2017: OCCASIONAL                            EROSION, 2017: GFR 25, JAN 2018-FEB 2018 Hb                            10.4-10.7 OVER 3 WEEKS COUNTS DRIFTED TO 7.4. 1u                            pRBC FEB 26. POSTRANFUSION Hb 8.4. JAN-FEB 2018 PLT                            CT 122-130. Providers:                Barney Drain, MD, Rosina Lowenstein, RN, Aram Candela Referring MD:             Fran Lowes MD, MD, DR. Marylene Buerger Medicines:                Propofol per Anesthesia Complications:            Bradycardia-PT GIVE ROBINUL 0.2 MG AND ATROPINE 0.2                            MG IV. PT BRADYCARDIA INDUCED BY ABDOMIAL                            PRESSURE/LOOPING OF THE COLONOSCOPE AND THEREFORE I                            WAS UNABLE TO INTUBATE THE ILEUM. Estimated Blood Loss:     Estimated blood loss was minimal. Procedure:                Pre-Anesthesia Assessment:                           - Prior to the procedure, a History and Physical                            was performed, and patient medications and                            allergies were reviewed. The patient's tolerance of  previous anesthesia was also reviewed. The risks                            and benefits of the procedure and the sedation                            options and risks were discussed with the patient.                            All questions were answered, and informed consent                            was obtained.  Prior Anticoagulants: The patient has                            taken no previous anticoagulant or antiplatelet                            agents. ASA Grade Assessment: II - A patient with                            mild systemic disease. After reviewing the risks                            and benefits, the patient was deemed in                            satisfactory condition to undergo the procedure.                            After obtaining informed consent, the colonoscope                            was passed under direct vision. Throughout the                            procedure, the patient's blood pressure, pulse, and                            oxygen saturations were monitored continuously. The                            Colonoscope was introduced through the anus and                            advanced to the the cecum, identified by                            appendiceal orifice and ileocecal valve. The                            ileocecal valve, appendiceal orifice, and rectum  were photographed. The colonoscopy was technically                            difficult and complex due to significant looping                            and the patient's cardiovascular instability                            (arrhythmia). Successful completion of the                            procedure was aided by straightening and shortening                            the scope to obtain bowel loop reduction and                            managing the patient's medical instability. The                            patient tolerated the procedure fairly well. The                            quality of the bowel preparation was excellent. Scope In: 10:02:00 AM Scope Out: 10:32:13 AM Scope Withdrawal Time: 0 hours 20 minutes 44 seconds  Total Procedure Duration: 0 hours 30 minutes 13 seconds  Findings:      The digital rectal exam findings include non-thrombosed external        hemorrhoids.      The recto-sigmoid colon, sigmoid colon and transverse colon revealed       significantly excessive looping.      Multiple small and large-mouthed diverticula were found in the       recto-sigmoid colon and sigmoid colon.      The mucosa vascular pattern in the rectum was segmentally increased       CONSISTENT WITH RADIATION PROCTITIS. SMALL AMOUNT OF BRIGHT RED BLOOD       SEEN IN RECTUM Coagulation for tissue destruction using argon plasma at       0.8 liters/minute and 20 watts was successful. Estimated blood loss was       minimal. HEMOSTASIS AND OBLITERATION ACHEIVED.      External hemorrhoids were found. The hemorrhoids were moderate. Impression:               - Non-thrombosed external hemorrhoids found on                            digital rectal exam.                           - There was significant looping of the LEFT colon.                           - SEVERE Diverticulosis in the recto-sigmoid colon  and in the sigmoid colon.                           - External hemorrhoids.                           - RADIATION PROCTITIS CONTRIBUTING TO TRANSFUSION                            DEPENDENT ANEMIA. ANEMIA EXACERBATED BY CHRONIC                            RENALINSUFFICIENCY AND THROMBOCYTOPENIA/ASA USE. Moderate Sedation:      Per Anesthesia Care Recommendation:           - Resume previous diet.                           - Continue present medications. HOLD IRON FOR 30                            DAYS TO ASSESS IF PT TRULY IS HAVING BLACK TARRY                            STOOLS. REPEAT CBC IN 2 WEEKS. CONTACTED [place].                           - Return to my office in 4 weeks.                           - NO ADDITONAL GI WORKUP AVAILABLE TO IDENTIFY GI                            SOURCE FOR BLOOD LOSS.                           - Patient has a contact number available for                             emergencies. The signs and symptoms of potential                            delayed complications were discussed with the                            patient. Return to normal activities tomorrow.                            Written discharge instructions were provided to the                            patient.                           -  No repeat colonoscopy due to age. Procedure Code(s):        --- Professional ---                           [phone], Colonoscopy, flexible; with ablation of                            tumor(s), polyp(s), or other lesion(s) (includes                            pre- and post-dilation and guide wire passage, when                            performed) Diagnosis Code(s):        --- Professional ---                           K64.4, Residual hemorrhoidal skin tags                           D50.0, Iron deficiency anemia secondary to blood                            loss (chronic)                           K57.30, Diverticulosis of large intestine without                            perforation or abscess without bleeding CPT copyright 2016 American Medical Association. All rights reserved. The codes documented in this report are preliminary and upon coder review may  be revised to meet current compliance requirements. Barney Drain, MD Barney Drain, MD 02/25/2017 11:04:03 AM This report has been signed electronically. Number of Addenda: 0

## 2017-02-25 NOTE — Interval H&P Note (Signed)
History and Physical Interval Note:  02/25/2017 9:16 AM  Andrew Zhang  has presented today for surgery, with the diagnosis of rectal bleeding  The various methods of treatment have been discussed with the patient and family. After consideration of risks, benefits and other options for treatment, the patient has consented to  Procedure(s) with comments: COLONOSCOPY WITH PROPOFOL (N/A) - 9:30am as a surgical intervention .  The patient's history has been reviewed, patient examined, no change in status, stable for surgery.  I have reviewed the patient's chart and labs.  Questions were answered to the patient's satisfaction.     [employer]

## 2017-02-25 NOTE — Anesthesia Procedure Notes (Signed)
Procedure Name: MAC Date/Time: 02/25/2017 9:38 AM Performed by: Vista Deck Pre-anesthesia Checklist: Patient identified, Emergency Drugs available, Suction available, Timeout performed and Patient being monitored Patient Re-evaluated:Patient Re-evaluated prior to inductionOxygen Delivery Method: Non-rebreather mask

## 2017-02-25 NOTE — Telephone Encounter (Signed)
ENDO called to make patient a 4 month follow up and also said that patient needs a repeat CBC on 03/10/2017

## 2017-02-25 NOTE — Anesthesia Preprocedure Evaluation (Signed)
Anesthesia Evaluation  Patient identified by MRN, date of birth, ID band Patient awake    Reviewed: Allergy & Precautions, NPO status , Patient's Chart, lab work & pertinent test results  History of Anesthesia Complications (+) PONV and history of anesthetic complications  Airway Mallampati: I  TM Distance: >3 FB     Dental  (+) Edentulous Upper, Partial Lower   Pulmonary sleep apnea , former smoker,    breath sounds clear to auscultation       Cardiovascular hypertension, Pt. on medications + CAD, + CABG and + Peripheral Vascular Disease   Rhythm:Regular Rate:Normal     Neuro/Psych PSYCHIATRIC DISORDERS Depression    GI/Hepatic hiatal hernia, GERD  ,  Endo/Other  diabetes, Type 2  Renal/GU Renal disease     Musculoskeletal  (+) Arthritis ,   Abdominal   Peds  Hematology  (+) anemia ,   Anesthesia Other Findings   Reproductive/Obstetrics                             Anesthesia Physical Anesthesia Plan  ASA: III  Anesthesia Plan: MAC   Post-op Pain Management:    Induction: Intravenous  Airway Management Planned: Simple Face Mask  Additional Equipment:   Intra-op Plan:   Post-operative Plan:   Informed Consent: I have reviewed the patients History and Physical, chart, labs and discussed the procedure including the risks, benefits and alternatives for the proposed anesthesia with the patient or authorized representative who has indicated his/her understanding and acceptance.     Plan Discussed with:   Anesthesia Plan Comments:         Anesthesia Quick Evaluation

## 2017-02-25 NOTE — H&P (View-Only) (Signed)
Subjective:     Patient ID: Andrew Zhang, male   DOB: 02-Dec-1934, 83 y.o.   MRN: [id number] Andrew Savage, MD  HPI PT HAVING OCCASIONAL BRBPR. RECENT TCS-RADIATION PROCTITIS. CONTINUES WITH NEED FOR pRBCs(5u SINCE DEC 2017). ASLO HAS CRI(GFR25), AND LOW VZCH(885O). STOOL ARE BLACK DUE TO PO IRON  PT DENIES FEVER, CHILLS, HEMATEMESIS, nausea, vomiting, melena, diarrhea, CHEST PAIN, SHORTNESS OF BREATH,  CHANGE IN BOWEL IN HABITS, constipation, abdominal pain, problems swallowing, problems with sedation, OR heartburn or indigestion.   Past Medical History:  Diagnosis Date  . AAA (abdominal aortic aneurysm) ([place])   . Arthritis   . B12 deficiency   . CAD (coronary artery disease)   . Cancer ([place])   . Depression   . Diverticulitis   . DMII (diabetes mellitus, type 2) ([place]) 2008  . GERD (gastroesophageal reflux disease)   . Hiatal hernia   . HTN (hypertension)   . Hyperlipidemia   . Prostate cancer [facility])     Past Surgical History:  Procedure Laterality Date  . APPENDECTOMY    . BACK SURGERY    . CHOLECYSTECTOMY    . COLONOSCOPY  03/2015   Dr. Britta Mccreedy: Diverticulosis, single tubular adenoma removed.  . CORONARY ARTERY BYPASS GRAFT    . DESCENDING AORTIC ANEURYSM REPAIR W/ STENT    . ESOPHAGOGASTRODUODENOSCOPY N/A 07/30/2016   Dr. Oneida Alar. Nonobstructing Schatzki ring at GE junction, multiple small sessile polyps with no bleeding or stigmata of recent bleeding in the gastric fundus and gastric body. Benign-appearing intrinsic moderate stenosis found the pylorus status post dilation. Small bowel capsule deployed.  Marland Kitchen GIVENS CAPSULE STUDY     Capsule study is complete to the cecum. No obvious lesions, mass, tumors. Small wisps of blood noted in small bowel secondary to EGD/dilation. Possible few erosions in setting of NSAIDs. No obvious areas of bleeding.    Allergies  Allergen Reactions  . Penicillins Rash   Current Outpatient Prescriptions  Medication Sig Dispense Refill  .  aspirin 81 MG chewable tablet Chew 81 mg by mouth daily.    . Calcium Carbonate-Vitamin D (CALTRATE 600+D) 600-400 MG-UNIT per tablet Take 1 tablet by mouth daily.    . Cyanocobalamin 1000 MCG/ML LIQD Take by mouth daily.    . ferrous sulfate 325 (65 FE) MG tablet Take 1 tablet (325 mg total) by mouth 2 (two) times daily with a meal. 60 tablet 0  . finasteride (PROSCAR) 5 MG tablet Take 5 mg by mouth daily.    Marland Kitchen glimepiride (AMARYL) 2 MG tablet Take 2 mg by mouth daily with breakfast.     . lisinopril (PRINIVIL,ZESTRIL) 2.5 MG tablet Take 2.5 mg by mouth daily.    . nitroGLYCERIN (NITROSTAT) 0.4 MG SL tablet Place 1 tablet (0.4 mg total) under the tongue every 5 (five) minutes as needed for chest pain. 25 tablet 3  . omeprazole (PRILOSEC) 20 MG capsule Take 1 capsule (20 mg total) by mouth 2 (two) times daily before a meal. 60 capsule 0  . sertraline (ZOLOFT) 100 MG tablet Take 50 mg by mouth daily.    . simvastatin (ZOCOR) 80 MG tablet Take 80 mg by mouth at bedtime.    . tamsulosin (FLOMAX) 0.4 MG CAPS Take 0.4 mg by mouth daily after supper.    . metFORMIN (GLUCOPHAGE) 500 MG tablet Take 500 mg by mouth every evening.     No current facility-administered medications for this visit.       Review of Systems PER HPI OTHERWISE  ALL SYSTEMS ARE NEGATIVE.    Objective:   Physical Exam  Constitutional: He is oriented to person, place, and time. He appears well-developed and well-nourished. No distress.  HENT:  Head: Normocephalic and atraumatic.  Mouth/Throat: Oropharynx is clear and moist. No oropharyngeal exudate.  Eyes: Pupils are equal, round, and reactive to light. No scleral icterus.  Neck: Normal range of motion. Neck supple.  Cardiovascular: Normal rate, regular rhythm and normal heart sounds.   Pulmonary/Chest: Effort normal and breath sounds normal. No respiratory distress.  Abdominal: Soft. Bowel sounds are normal. He exhibits no distension. There is no tenderness.   Musculoskeletal: He exhibits no edema.  Lymphadenopathy:    He has no cervical adenopathy.  Neurological: He is alert and oriented to person, place, and time.  NO  NEW FOCAL DEFICITS  Psychiatric:  SLIGHTLY ANXIOUS MOOD, FLAT AFFECT   Vitals reviewed.     Assessment:         Plan:

## 2017-02-25 NOTE — Discharge Instructions (Signed)
You have RADIATION PROCTITIS, which CAN CAUSE FOR YOUR RECTAL BLEEDING/LOW BLOOD COUNT.  YOU HAVE diverticulosis IN YOUR LEFT COLON.   HOLD IRON FOR 30 DAYS.  Follow a HIGH FIBER DIET. AVOID ITEMS THAT CAUSE BLOATING. See info below.  USE PREPARATION H FOUR TIMES A DAY FOR 7 DAYS IF YOU HAVE RECTAL BLEEDING.   HAVE LABS FOR DR. Theador Hawthorne DRAWN ON MAR 5.  FOLLOW UP IN 4 MOS.    REPEAT CBC MAR 12.   Colonoscopy Care After Read the instructions outlined below and refer to this sheet in the next week. These discharge instructions provide you with general information on caring for yourself after you leave the hospital. While your treatment has been planned according to the most current medical practices available, unavoidable complications occasionally occur. If you have any problems or questions after discharge, call DR. Athea Haley, [phone].  ACTIVITY  You may resume your regular activity, but move at a slower pace for the next 24 hours.   Take frequent rest periods for the next 24 hours.   Walking will help get rid of the air and reduce the bloated feeling in your belly (abdomen).   No driving for 24 hours (because of the medicine (anesthesia) used during the test).   You may shower.   Do not sign any important legal documents or operate any machinery for 24 hours (because of the anesthesia used during the test).    NUTRITION  Drink plenty of fluids.   You may resume your normal diet as instructed by your doctor.   Begin with a light meal and progress to your normal diet. Heavy or fried foods are harder to digest and may make you feel sick to your stomach (nauseated).   Avoid alcoholic beverages for 24 hours or as instructed.    MEDICATIONS  You may resume your normal medications.   WHAT YOU CAN EXPECT TODAY  Some feelings of bloating in the abdomen.   Passage of more gas than usual.   Spotting of blood in your stool or on the toilet paper  .  IF YOU HAD  POLYPS REMOVED DURING THE COLONOSCOPY:  Eat a soft diet IF YOU HAVE NAUSEA, BLOATING, ABDOMINAL PAIN, OR VOMITING.    FINDING OUT THE RESULTS OF YOUR TEST Not all test results are available during your visit. DR. Oneida Alar WILL CALL YOU WITHIN 7 DAYS OF YOUR PROCEDUE WITH YOUR RESULTS. Do not assume everything is normal if you have not heard from DR. Carmen Vallecillo IN ONE WEEK, CALL HER OFFICE AT [phone].  SEEK IMMEDIATE MEDICAL ATTENTION AND CALL THE OFFICE: [phone] IF:  You have more than a spotting of blood in your stool.   Your belly is swollen (abdominal distention).   You are nauseated or vomiting.   You have a temperature over 101F.   You have abdominal pain or discomfort that is severe or gets worse throughout the day.  High-Fiber Diet A high-fiber diet changes your normal diet to include more whole grains, legumes, fruits, and vegetables. Changes in the diet involve replacing refined carbohydrates with unrefined foods. The calorie level of the diet is essentially unchanged. The Dietary Reference Intake (recommended amount) for adult males is 38 grams per day. For adult females, it is 25 grams per day. Pregnant and lactating women should consume 28 grams of fiber per day. Fiber is the intact part of a plant that is not broken down during digestion. Functional fiber is fiber that has been isolated from the plant to  provide a beneficial effect in the body. PURPOSE  Increase stool bulk.   Ease and regulate bowel movements.   Lower cholesterol.  INDICATIONS THAT YOU NEED MORE FIBER  Constipation and hemorrhoids.   Uncomplicated diverticulosis (intestine condition) and irritable bowel syndrome.   Weight management.   As a protective measure against hardening of the arteries (atherosclerosis), diabetes, and cancer.   GUIDELINES FOR INCREASING FIBER IN THE DIET  Start adding fiber to the diet slowly. A gradual increase of about 5 more grams (2 slices of whole-wheat bread, 2  servings of most fruits or vegetables, or 1 bowl of high-fiber cereal) per day is best. Too rapid an increase in fiber may result in constipation, flatulence, and bloating.   Drink enough water and fluids to keep your urine clear or pale yellow. Water, juice, or caffeine-free drinks are recommended. Not drinking enough fluid may cause constipation.   Eat a variety of high-fiber foods rather than one type of fiber.   Try to increase your intake of fiber through using high-fiber foods rather than fiber pills or supplements that contain small amounts of fiber.   The goal is to change the types of food eaten. Do not supplement your present diet with high-fiber foods, but replace foods in your present diet.  INCLUDE A VARIETY OF FIBER SOURCES  Replace refined and processed grains with whole grains, canned fruits with fresh fruits, and incorporate other fiber sources. White rice, white breads, and most bakery goods contain little or no fiber.   Brown whole-grain rice, buckwheat oats, and many fruits and vegetables are all good sources of fiber. These include: broccoli, Brussels sprouts, cabbage, cauliflower, beets, sweet potatoes, white potatoes (skin on), carrots, tomatoes, eggplant, squash, berries, fresh fruits, and dried fruits.   Cereals appear to be the richest source of fiber. Cereal fiber is found in whole grains and bran. Bran is the fiber-rich outer coat of cereal grain, which is largely removed in refining. In whole-grain cereals, the bran remains. In breakfast cereals, the largest amount of fiber is found in those with "bran" in their names. The fiber content is sometimes indicated on the label.   You may need to include additional fruits and vegetables each day.   In baking, for 1 cup white flour, you may use the following substitutions:   1 cup whole-wheat flour minus 2 tablespoons.   1/2 cup white flour plus 1/2 cup whole-wheat flour.   Diverticulosis Diverticulosis is a common  condition that develops when small pouches (diverticula) form in the wall of the colon. The risk of diverticulosis increases with age. It happens more often in people who eat a low-fiber diet. Most individuals with diverticulosis have no symptoms. Those individuals with symptoms usually experience belly (abdominal) pain, constipation, or loose stools (diarrhea).  HOME CARE INSTRUCTIONS  Increase the amount of fiber in your diet as directed by your caregiver or dietician. This may reduce symptoms of diverticulosis.   Drink at least 6 to 8 glasses of water each day to prevent constipation.   Try not to strain when you have a bowel movement.   Avoiding nuts and seeds to prevent complications is NOT NECESSARY.       FOODS HAVING HIGH FIBER CONTENT INCLUDE:  Fruits. Apple, peach, pear, tangerine, raisins, prunes.   Vegetables. Brussels sprouts, asparagus, broccoli, cabbage, carrot, cauliflower, romaine lettuce, spinach, summer squash, tomato, winter squash, zucchini.   Starchy Vegetables. Baked beans, kidney beans, lima beans, split peas, lentils, potatoes (with skin).  Grains. Whole wheat bread, brown rice, bran flake cereal, plain oatmeal, white rice, shredded wheat, bran muffins.    SEEK IMMEDIATE MEDICAL CARE IF:  You develop increasing pain or severe bloating.   You have an oral temperature above 101F.   You develop vomiting or bowel movements that are bloody or black.   Hemorrhoids Hemorrhoids are dilated (enlarged) veins around the rectum. Sometimes clots will form in the veins. This makes them swollen and painful. These are called thrombosed hemorrhoids. Causes of hemorrhoids include:  Constipation.   Straining to have a bowel movement.   HEAVY LIFTING HOME CARE INSTRUCTIONS  Eat a well balanced diet and drink 6 to 8 glasses of water every day to avoid constipation. You may also use a bulk laxative.   Avoid straining to have bowel movements.   Keep anal area dry  and clean.   Do not use a donut shaped pillow or sit on the toilet for long periods. This increases blood pooling and pain.   Move your bowels when your body has the urge; this will require less straining and will decrease pain and pressure.

## 2017-02-25 NOTE — Anesthesia Postprocedure Evaluation (Signed)
Anesthesia Post Note  Patient: QUASHAUN LAZALDE  Procedure(s) Performed: Procedure(s) (LRB): COLONOSCOPY WITH PROPOFOL (N/A)  Patient location during evaluation: PACU Anesthesia Type: MAC Level of consciousness: awake and alert Pain management: pain level controlled Vital Signs Assessment: post-procedure vital signs reviewed and stable Respiratory status: spontaneous breathing and patient connected to nasal cannula oxygen Cardiovascular status: stable Anesthetic complications: no     Last Vitals:  Vitals:   02/25/17 0757 02/25/17 1040  BP: (!) 151/71 (!) 98/49  Pulse: 69 73  Resp: (!) 22 (!) 23  Temp: 36.4 C 36.6 C    Last Pain:  Vitals:   02/25/17 0757  TempSrc: Oral                 Colbert Curenton

## 2017-02-26 ENCOUNTER — Encounter (HOSPITAL_COMMUNITY): Payer: Self-pay | Admitting: Gastroenterology

## 2017-02-27 ENCOUNTER — Other Ambulatory Visit: Payer: Self-pay

## 2017-02-27 DIAGNOSIS — D500 Iron deficiency anemia secondary to blood loss (chronic): Secondary | ICD-10-CM

## 2017-02-27 NOTE — Telephone Encounter (Signed)
Tried to call pt. Many rings and no answer.  Mailing the lab order to him to have done on 03/10/2017.

## 2017-02-28 DIAGNOSIS — E1129 Type 2 diabetes mellitus with other diabetic kidney complication: Secondary | ICD-10-CM | POA: Diagnosis not present

## 2017-02-28 DIAGNOSIS — N183 Chronic kidney disease, stage 3 (moderate): Secondary | ICD-10-CM | POA: Diagnosis not present

## 2017-02-28 DIAGNOSIS — D649 Anemia, unspecified: Secondary | ICD-10-CM | POA: Diagnosis not present

## 2017-02-28 DIAGNOSIS — I129 Hypertensive chronic kidney disease with stage 1 through stage 4 chronic kidney disease, or unspecified chronic kidney disease: Secondary | ICD-10-CM | POA: Diagnosis not present

## 2017-03-05 DIAGNOSIS — R809 Proteinuria, unspecified: Secondary | ICD-10-CM | POA: Diagnosis not present

## 2017-03-05 DIAGNOSIS — N184 Chronic kidney disease, stage 4 (severe): Secondary | ICD-10-CM | POA: Diagnosis not present

## 2017-03-05 DIAGNOSIS — D509 Iron deficiency anemia, unspecified: Secondary | ICD-10-CM | POA: Diagnosis not present

## 2017-03-05 DIAGNOSIS — D638 Anemia in other chronic diseases classified elsewhere: Secondary | ICD-10-CM | POA: Diagnosis not present

## 2017-03-10 DIAGNOSIS — D500 Iron deficiency anemia secondary to blood loss (chronic): Secondary | ICD-10-CM | POA: Diagnosis not present

## 2017-03-10 LAB — CBC WITH DIFFERENTIAL/PLATELET
BASOS ABS: 0 {cells}/uL (ref 0–200)
BASOS PCT: 0 %
Eosinophils Absolute: 192 cells/uL (ref 15–500)
Eosinophils Relative: 4 %
HEMATOCRIT: 28.3 % — AB (ref 38.5–50.0)
Hemoglobin: 9.1 g/dL — ABNORMAL LOW (ref 13.2–17.1)
LYMPHS PCT: 23 %
Lymphs Abs: 1104 cells/uL (ref 850–3900)
MCH: 30.8 pg (ref 27.0–33.0)
MCHC: 32.2 g/dL (ref 32.0–36.0)
MCV: 95.9 fL (ref 80.0–100.0)
MONO ABS: 288 {cells}/uL (ref 200–950)
MPV: 9.4 fL (ref 7.5–12.5)
Monocytes Relative: 6 %
Neutro Abs: 3216 cells/uL (ref 1500–7800)
Neutrophils Relative %: 67 %
Platelets: 156 10*3/uL (ref 140–400)
RBC: 2.95 MIL/uL — ABNORMAL LOW (ref 4.20–5.80)
RDW: 14.5 % (ref 11.0–15.0)
WBC: 4.8 10*3/uL (ref 3.8–10.8)

## 2017-03-11 ENCOUNTER — Telehealth: Payer: Self-pay | Admitting: Gastroenterology

## 2017-03-11 ENCOUNTER — Ambulatory Visit: Payer: Medicare Other | Admitting: Urology

## 2017-03-11 NOTE — Telephone Encounter (Signed)
Repeat labs in 4 weeks per [facility]

## 2017-03-11 NOTE — Telephone Encounter (Signed)
Pt's wife called to check on patient's lab results that he had done yesterday. I told her it may be too soon to have the results back yet, but I would let the nurse know that she had called. [phone]

## 2017-03-12 ENCOUNTER — Other Ambulatory Visit: Payer: Self-pay

## 2017-03-12 DIAGNOSIS — D649 Anemia, unspecified: Secondary | ICD-10-CM

## 2017-03-12 NOTE — Progress Notes (Signed)
Lab order on file for 04/10/2017.

## 2017-03-13 ENCOUNTER — Other Ambulatory Visit: Payer: Self-pay

## 2017-03-13 DIAGNOSIS — D649 Anemia, unspecified: Secondary | ICD-10-CM

## 2017-03-13 NOTE — Telephone Encounter (Signed)
Pt is aware blood count has improved and we will recheck in 4 weeks. I am mailing the order to him.

## 2017-03-13 NOTE — Telephone Encounter (Signed)
Lab order mailed to pt.

## 2017-03-17 ENCOUNTER — Encounter (HOSPITAL_COMMUNITY)
Admission: RE | Admit: 2017-03-17 | Discharge: 2017-03-17 | Disposition: A | Discharge status: Home / Self Care | Payer: Medicare Other | Source: Ambulatory Visit | Attending: Nephrology | Admitting: Nephrology

## 2017-03-17 ENCOUNTER — Encounter (HOSPITAL_COMMUNITY): Payer: Self-pay

## 2017-03-17 DIAGNOSIS — D509 Iron deficiency anemia, unspecified: Secondary | ICD-10-CM | POA: Insufficient documentation

## 2017-03-17 MED ORDER — SODIUM CHLORIDE 0.9 % IV SOLN
INTRAVENOUS | Status: DC
  Administered 2017-03-17: 250 mL via INTRAVENOUS

## 2017-03-17 MED ORDER — SODIUM CHLORIDE 0.9 % IV SOLN
510.0000 mg | Freq: Once | INTRAVENOUS | Status: AC
  Administered 2017-03-17: 510 mg via INTRAVENOUS
  Filled 2017-03-17: qty 17

## 2017-03-24 ENCOUNTER — Encounter (HOSPITAL_COMMUNITY)
Admission: RE | Admit: 2017-03-24 | Discharge: 2017-03-24 | Disposition: A | Discharge status: Home / Self Care | Payer: Medicare Other | Source: Ambulatory Visit | Attending: Nephrology | Admitting: Nephrology

## 2017-03-24 DIAGNOSIS — D509 Iron deficiency anemia, unspecified: Secondary | ICD-10-CM | POA: Diagnosis not present

## 2017-03-24 MED ORDER — SODIUM CHLORIDE 0.9 % IV SOLN
INTRAVENOUS | Status: DC
Start: 2017-03-24 — End: 2017-03-25
  Administered 2017-03-24: 250 mL via INTRAVENOUS

## 2017-03-24 MED ORDER — SODIUM CHLORIDE 0.9 % IV SOLN
510.0000 mg | Freq: Once | INTRAVENOUS | Status: AC
  Administered 2017-03-24: 510 mg via INTRAVENOUS
  Filled 2017-03-24: qty 17

## 2017-03-27 ENCOUNTER — Encounter (HOSPITAL_COMMUNITY): Payer: Self-pay

## 2017-03-27 ENCOUNTER — Encounter (HOSPITAL_COMMUNITY): Payer: Medicare Other

## 2017-03-27 ENCOUNTER — Encounter (HOSPITAL_COMMUNITY): Payer: Medicare Other | Attending: Oncology | Admitting: Oncology

## 2017-03-27 VITALS — BP 121/53 | HR 68 | Temp 97.6°F | Resp 18 | Wt 209.0 lb

## 2017-03-27 DIAGNOSIS — D631 Anemia in chronic kidney disease: Secondary | ICD-10-CM | POA: Diagnosis not present

## 2017-03-27 DIAGNOSIS — N183 Chronic kidney disease, stage 3 (moderate): Secondary | ICD-10-CM

## 2017-03-27 DIAGNOSIS — C61 Malignant neoplasm of prostate: Secondary | ICD-10-CM | POA: Diagnosis not present

## 2017-03-27 LAB — COMPREHENSIVE METABOLIC PANEL
ALBUMIN: 3.2 g/dL — AB (ref 3.5–5.0)
ALK PHOS: 57 U/L (ref 38–126)
ALT: 29 U/L (ref 17–63)
AST: 29 U/L (ref 15–41)
Anion gap: 6 (ref 5–15)
BUN: 34 mg/dL — AB (ref 6–20)
CALCIUM: 8.8 mg/dL — AB (ref 8.9–10.3)
CO2: 25 mmol/L (ref 22–32)
CREATININE: 2.35 mg/dL — AB (ref 0.61–1.24)
Chloride: 107 mmol/L (ref 101–111)
GFR calc Af Amer: 28 mL/min — ABNORMAL LOW (ref >60)
GFR calc non Af Amer: 25 mL/min — ABNORMAL LOW (ref >60)
Glucose, Bld: 257 mg/dL — ABNORMAL HIGH (ref 65–99)
Potassium: 5.3 mmol/L — ABNORMAL HIGH (ref 3.5–5.1)
SODIUM: 138 mmol/L (ref 135–145)
Total Bilirubin: 0.5 mg/dL (ref 0.3–1.2)
Total Protein: 6 g/dL — ABNORMAL LOW (ref 6.5–8.1)

## 2017-03-27 LAB — CBC WITH DIFFERENTIAL/PLATELET
BASOS ABS: 0 10*3/uL (ref 0.0–0.1)
BASOS PCT: 0 %
EOS ABS: 0.1 10*3/uL (ref 0.0–0.7)
Eosinophils Relative: 4 %
HCT: 26.5 % — ABNORMAL LOW (ref 39.0–52.0)
HEMOGLOBIN: 8.7 g/dL — AB (ref 13.0–17.0)
LYMPHS ABS: 0.8 10*3/uL (ref 0.7–4.0)
Lymphocytes Relative: 21 %
MCH: 32.2 pg (ref 26.0–34.0)
MCHC: 32.8 g/dL (ref 30.0–36.0)
MCV: 98.1 fL (ref 78.0–100.0)
Monocytes Absolute: 0.3 10*3/uL (ref 0.1–1.0)
Monocytes Relative: 8 %
NEUTROS PCT: 67 %
Neutro Abs: 2.5 10*3/uL (ref 1.7–7.7)
Platelets: ADEQUATE 10*3/uL (ref 150–400)
RBC: 2.7 MIL/uL — AB (ref 4.22–5.81)
RDW: 15.8 % — ABNORMAL HIGH (ref 11.5–15.5)
WBC: 3.7 10*3/uL — AB (ref 4.0–10.5)

## 2017-03-27 LAB — IRON AND TIBC
Iron: 175 ug/dL (ref 45–182)
Saturation Ratios: 63 % — ABNORMAL HIGH (ref 17.9–39.5)
TIBC: 276 ug/dL (ref 250–450)
UIBC: 101 ug/dL

## 2017-03-27 LAB — RETICULOCYTES
RBC.: 2.7 MIL/uL — ABNORMAL LOW (ref 4.22–5.81)
Retic Count, Absolute: 135 10*3/uL (ref 19.0–186.0)
Retic Ct Pct: 5 % — ABNORMAL HIGH (ref 0.4–3.1)

## 2017-03-27 LAB — FERRITIN: Ferritin: 462 ng/mL — ABNORMAL HIGH (ref 24–336)

## 2017-03-27 LAB — VITAMIN B12: Vitamin B-12: 786 pg/mL (ref 180–914)

## 2017-03-27 NOTE — Patient Instructions (Addendum)
[facility] Discharge Instructions  RECOMMENDATIONS MADE BY THE CONSULTANT AND ANY TEST RESULTS WILL BE SENT TO YOUR REFERRING PHYSICIAN.  You were seen today by Dr. Louise Zhou Follow up in 3 months with lab work See Amy up front for appointments   Thank you for choosing [facility] at [facility] to provide your oncology and hematology care.  To afford each patient quality time with our provider, please arrive at least 15 minutes before your scheduled appointment time.    If you have a lab appointment with the Cancer Center please come in thru the  Main Entrance and check in at the main information desk  You need to re-schedule your appointment should you arrive 10 or more minutes late.  We strive to give you quality time with our providers, and arriving late affects you and other patients whose appointments are after yours.  Also, if you no show three or more times for appointments you may be dismissed from the clinic at the providers discretion.     Again, thank you for choosing [facility].  Our hope is that these requests will decrease the amount of time that you wait before being seen by our physicians.       _____________________________________________________________  Should you have questions after your visit to [facility], please contact our office at [phone] between the hours of 8:30 a.m. and 4:30 p.m.  Voicemails left after 4:30 p.m. will not be returned until the following business day.  For prescription refill requests, have your pharmacy contact our office.       Resources For Cancer Patients and their Caregivers ? American Cancer Society: Can assist with transportation, wigs, general needs, runs Look Good Feel Better.        1-888-227-6333 ? Cancer Care: Provides financial assistance, online support groups, medication/co-pay assistance.  1-800-813-HOPE (4673) ? [facility] Assists [place] cancer patients and their families through emotional , educational and financial support.  336-427-4357 ? [place] DSS Where to apply for food stamps, Medicaid and utility assistance. 336-342-1394 ? [facility]: Transportation to medical appointments. 336-347-2287 ? Social Security Administration: May apply for disability if have a Stage IV cancer. 336-342-7796 1-800-772-1213 ? [place] Aging, Disability and Transit Services: Assists with nutrition, care and transit needs. 336-349-2343  Cancer Center Support Programs: [email or web address]@ > Cancer Support Group  2nd Tuesday of the month 1pm-2pm, Journey Room  > Creative Journey  3rd Tuesday of the month 1130am-1pm, Journey Room  > Look Good Feel Better  1st Wednesday of the month 10am-12 noon, Journey Room (Call American Cancer Society to register 1-800-395-5775)    

## 2017-03-27 NOTE — Progress Notes (Signed)
HEMATOLOGY/ONCOLOGY PROGRESS NOTE  Date of Service: 03/27/2017  Patient Care Team: [name], MD as PCP - General (Family Medicine) Herminio Commons, MD as Attending Physician (Cardiology) Danie Binder, MD as Consulting Physician (Gastroenterology)  CHIEF COMPLAINTS/PURPOSE OF CONSULTATION:  Anemia Stage III chronic kidney disease Low B12 Hypogonadism Iron deficiency  HISTORY OF PRESENTING ILLNESS:   Andrew Zhang is a wonderful 83 y.o. male who has been referred to [place] by Dr [name], MD for evaluation and management of Anemia.  Patient has a history of coronary artery disease status post CABG, hypertension, diabetes, dyslipidemia, abdominal aortic aneurysm status post repair, prostate cancer currently on Lupron shots every 3 months (follows with Dr Willow Ora).  Andrew Zhang is accompanied by his wife for continuing follow up. I personally reviewed and went over laboratory studies with the patient.  He received 2 units of feraheme on 3/19 and 03/24/17. He has been started on procrit by his nephrologist per patient.   Patient states that he is doing well. Denies fatigue. He states that he doesn't think his stool is black and denies having any blood in his stool.      MEDICAL HISTORY:  Past Medical History:  Diagnosis Date  . AAA (abdominal aortic aneurysm) ([place])   . Anemia   . Arthritis   . B12 deficiency   . CAD (coronary artery disease)   . Cancer [facility])    radiation for prostate cancer and lupron.  . Complication of anesthesia    sts,"after hearat surgery my stomach didnt wake up like it was supossed to".  . Depression   . Diverticulitis   . DMII (diabetes mellitus, type 2) ([place]) 2008  . GERD (gastroesophageal reflux disease)   . Hiatal hernia   . History of kidney stones   . HTN (hypertension)   . Hyperlipidemia   . PONV (postoperative nausea and vomiting)   . Prostate cancer ([place])   . Sleep apnea    cannot tolerate, PCP aware    SURGICAL  HISTORY: Past Surgical History:  Procedure Laterality Date  . APPENDECTOMY    . BACK SURGERY     x2  . CATARACT EXTRACTION Left   . CHOLECYSTECTOMY    . COLONOSCOPY  03/2015   Dr. Britta Mccreedy: Diverticulosis, single tubular adenoma removed.  . COLONOSCOPY WITH PROPOFOL N/A 02/25/2017   Procedure: COLONOSCOPY WITH PROPOFOL;  Surgeon: Danie Binder, MD;  Location: [facility];  Service: Endoscopy;  Laterality: N/A;  9:30am  . CORONARY ARTERY BYPASS GRAFT    . CYSTOSCOPY     with stent-left kidney. x2  . DESCENDING AORTIC ANEURYSM REPAIR W/ STENT     x2  . ESOPHAGOGASTRODUODENOSCOPY N/A 07/30/2016   Dr. Oneida Alar. Nonobstructing Schatzki ring at GE junction, multiple small sessile polyps with no bleeding or stigmata of recent bleeding in the gastric fundus and gastric body. Benign-appearing intrinsic moderate stenosis found the pylorus status post dilation. Small bowel capsule deployed.  Marland Kitchen GIVENS CAPSULE STUDY     Capsule study is complete to the cecum. No obvious lesions, mass, tumors. Small wisps of blood noted in small bowel secondary to EGD/dilation. Possible few erosions in setting of NSAIDs. No obvious areas of bleeding.    SOCIAL HISTORY: Social History   Social History  . Marital status: Married    Spouse name: N/A  . Number of children: N/A  . Years of education: N/A   Occupational History  . Not on file.   Social History Main Topics  .  Smoking status: Former Smoker    Packs/day: 3.00    Years: 45.00    Types: Cigarettes    Start date: 04/19/1951    Quit date: 03/01/1994  . Smokeless tobacco: Never Used  . Alcohol use 0.0 oz/week     Comment: 1-2 beers daily   . Drug use: No  . Sexual activity: Yes    Birth control/ protection: None     Comment: married 59 years - 5 children    Other Topics Concern  . Not on file   Social History Narrative  . No narrative on file    FAMILY HISTORY: Family History  Problem Relation Age of Onset  . Heart disease    . Diabetes      . Hypertension    . Liver cancer Mother   . Colon cancer Neg Hx     ALLERGIES:  is allergic to penicillins.  MEDICATIONS:  Current Outpatient Prescriptions  Medication Sig Dispense Refill  . aspirin 81 MG tablet Chew 81 mg by mouth daily.    . Calcium Carbonate-Vitamin D (CALTRATE 600+D) 600-400 MG-UNIT per tablet Take 1 tablet by mouth daily.    . Cyanocobalamin 1000 MCG/ML LIQD Place 1 mL under the tongue daily.     . finasteride (PROSCAR) 5 MG tablet Take 5 mg by mouth daily.    Marland Kitchen glimepiride (AMARYL) 2 MG tablet Take 2 mg by mouth daily with breakfast.     . lisinopril (PRINIVIL,ZESTRIL) 2.5 MG tablet Take 2.5 mg by mouth daily.    . metFORMIN (GLUCOPHAGE) 500 MG tablet Take 500 mg by mouth every evening.    . nitroGLYCERIN (NITROSTAT) 0.4 MG SL tablet Place 1 tablet (0.4 mg total) under the tongue every 5 (five) minutes as needed for chest pain. 25 tablet 3  . omeprazole (PRILOSEC) 20 MG capsule Take 1 capsule (20 mg total) by mouth 2 (two) times daily before a meal. 60 capsule 0  . sertraline (ZOLOFT) 100 MG tablet Take 50 mg by mouth daily.    . simvastatin (ZOCOR) 80 MG tablet Take 80 mg by mouth at bedtime.    . tamsulosin (FLOMAX) 0.4 MG CAPS Take 0.4 mg by mouth daily after supper.    . triamcinolone cream (KENALOG) 0.1 %   5   No current facility-administered medications for this visit.      REVIEW OF SYSTEMS:   Review of Systems  Constitutional: Negative.  Negative for malaise/fatigue.  HENT: Negative.   Eyes: Negative.   Respiratory: Negative.   Cardiovascular: Negative.   Gastrointestinal: Negative.  Negative for abdominal pain, blood in stool and melena.  Genitourinary: Negative.   Musculoskeletal: Negative.   Skin: Negative.   Neurological: Negative.   Endo/Heme/Allergies: Negative.   Psychiatric/Behavioral: Negative.   All other systems reviewed and are negative.  14 point review of systems was performed and is negative except as detailed under history  of present illness and above   PHYSICAL EXAMINATION: ECOG PERFORMANCE STATUS: 1 - Symptomatic but completely ambulatory  Vitals:   03/27/17 1320  BP: (!) 121/53  Pulse: 68  Resp: 18  Temp: 97.6 F (36.4 C)   Filed Weights   03/27/17 1320  Weight: 209 lb (94.8 kg)      Physical Exam  Constitutional: He is oriented to person, place, and time and well-developed, well-nourished, and in no distress.  HENT:  Head: Normocephalic and atraumatic.  Mouth/Throat: Oropharynx is clear and moist.  Eyes: Conjunctivae and EOM are normal. Pupils are equal,  round, and reactive to light.  Neck: Normal range of motion. Neck supple.  Cardiovascular: Normal rate, regular rhythm and normal heart sounds.   Pulmonary/Chest: Effort normal and breath sounds normal.  Abdominal: Soft. Bowel sounds are normal.  Musculoskeletal: Normal range of motion.  Neurological: He is alert and oriented to person, place, and time. Gait normal.  Skin: Skin is warm and dry.  Nursing note and vitals reviewed.   LABORATORY DATA:  I have reviewed the data as listed  CBC Latest Ref Rng & Units 03/27/2017 03/10/2017 02/24/2017  WBC 4.0 - 10.5 K/uL 3.7(L) 4.8 -  Hemoglobin 13.0 - 17.0 g/dL 8.7(L) 9.1(L) 8.4(L)  Hematocrit 39.0 - 52.0 % 26.5(L) 28.3(L) 25.2(L)  Platelets 150 - 400 K/uL PLATELET CLUMPS NOTED ON SMEAR, COUNT APPEARS ADEQUATE 156 -   . CBC    Component Value Date/Time   WBC 3.7 (L) 03/27/2017 1243   RBC 2.70 (L) 03/27/2017 1243   RBC 2.70 (L) 03/27/2017 1243   HGB 8.7 (L) 03/27/2017 1243   HCT 26.5 (L) 03/27/2017 1243   HCT 30.2 (L) 08/02/2016 1439   PLT  03/27/2017 1243    PLATELET CLUMPS NOTED ON SMEAR, COUNT APPEARS ADEQUATE   MCV 98.1 03/27/2017 1243   MCH 32.2 03/27/2017 1243   MCHC 32.8 03/27/2017 1243   RDW 15.8 (H) 03/27/2017 1243   LYMPHSABS 0.8 03/27/2017 1243   MONOABS 0.3 03/27/2017 1243   EOSABS 0.1 03/27/2017 1243   BASOSABS 0.0 03/27/2017 1243    CMP Latest Ref Rng & Units  03/27/2017 02/20/2017 12/02/2016  Glucose 65 - 99 mg/dL 257(H) 167(H) 138(H)  BUN 6 - 20 mg/dL 34(H) 62(H) 51(H)  Creatinine 0.61 - 1.24 mg/dL 2.35(H) 2.32(H) 2.30(H)  Sodium 135 - 145 mmol/L 138 139 139  Potassium 3.5 - 5.1 mmol/L 5.3(H) 5.1 4.7  Chloride 101 - 111 mmol/L 107 111 110  CO2 22 - 32 mmol/L 25 23 22   Calcium 8.9 - 10.3 mg/dL 8.8(L) 9.0 9.2  Total Protein 6.5 - 8.1 g/dL 6.0(L) - 6.7  Total Bilirubin 0.3 - 1.2 mg/dL 0.5 - 0.4  Alkaline Phos 38 - 126 U/L 57 - 46  AST 15 - 41 U/L 29 - 31  ALT 17 - 63 U/L 29 - 30       RADIOGRAPHIC STUDIES: I have personally reviewed the radiological images as listed and agreed with the findings in the report. Study Result   CLINICAL DATA:  83 year old male with weakness, dizziness, shortness of breath for 3 weeks. Anemia. Former smoker. Initial encounter.  EXAM: CHEST  2 VIEW  COMPARISON:  [facility] chest radiographs 06/26/2016 and earlier.  CT Abdomen and Pelvis 06/26/2016  FINDINGS: Stable mediastinal contours. Mild cardiomegaly. Sequelae of CABG. Visualized tracheal air column is within normal limits. Partially visible abdominal aortic endograft. Stable lung volumes. No pneumothorax, pulmonary edema, pleural effusion or acute pulmonary opacity. Calcified thoracic aortic atherosclerosis. No acute osseous abnormality identified. Stable cholecystectomy clips.  IMPRESSION: 1.  No acute cardiopulmonary abnormality. 2. Calcified aortic atherosclerosis.   Electronically Signed   By: Genevie Ann M.D.   On: 07/29/2016 16:19     ASSESSMENT & PLAN:  Anemia, multifactorial due to radiation proctitis, CKD Stage III chronic kidney disease Low B12 Hypogonadism Iron deficiency Lupron use for prostate carcinoma Thrombocytopenia Colonoscopy 02/2017-severe diverticulosis, radiation proctitis  hematuria  Labs reviewed. Results noted above.  His most recent labs showed his hemoglobin at 8.7 d/gL. Continue  procrit through his nephrologist.  I gave the  patient a copy of his lab results today.   RTC in 3 months. Repeat labs prior to his next visit. CBC, CMP, and iron studies, EPO level.   All of the patients questions were answered with apparent satisfaction. The patient knows to call the clinic with any problems, questions or concerns.  This document serves as a record of services personally performed by Twana First, MD. It was created on her behalf by Shirlean Mylar, a trained medical scribe. The creation of this record is based on the scribe's personal observations and the provider's statements to them. This document has been checked and approved by the attending provider.  I have reviewed the above documentation for accuracy and completeness and I agree with the above.  03/27/2017 1:51 PM

## 2017-04-16 ENCOUNTER — Ambulatory Visit (INDEPENDENT_AMBULATORY_CARE_PROVIDER_SITE_OTHER): Payer: Medicare Other | Admitting: Urology

## 2017-04-16 DIAGNOSIS — C61 Malignant neoplasm of prostate: Secondary | ICD-10-CM

## 2017-04-16 DIAGNOSIS — N131 Hydronephrosis with ureteral stricture, not elsewhere classified: Secondary | ICD-10-CM

## 2017-04-18 ENCOUNTER — Other Ambulatory Visit: Payer: Self-pay | Admitting: Urology

## 2017-04-21 ENCOUNTER — Other Ambulatory Visit: Payer: Self-pay | Admitting: Urology

## 2017-04-30 DIAGNOSIS — E559 Vitamin D deficiency, unspecified: Secondary | ICD-10-CM | POA: Diagnosis not present

## 2017-04-30 DIAGNOSIS — Z79899 Other long term (current) drug therapy: Secondary | ICD-10-CM | POA: Diagnosis not present

## 2017-04-30 DIAGNOSIS — D509 Iron deficiency anemia, unspecified: Secondary | ICD-10-CM | POA: Diagnosis not present

## 2017-04-30 DIAGNOSIS — I10 Essential (primary) hypertension: Secondary | ICD-10-CM | POA: Diagnosis not present

## 2017-04-30 DIAGNOSIS — N183 Chronic kidney disease, stage 3 (moderate): Secondary | ICD-10-CM | POA: Diagnosis not present

## 2017-04-30 DIAGNOSIS — R809 Proteinuria, unspecified: Secondary | ICD-10-CM | POA: Diagnosis not present

## 2017-05-05 DIAGNOSIS — Z1211 Encounter for screening for malignant neoplasm of colon: Secondary | ICD-10-CM | POA: Diagnosis not present

## 2017-05-05 DIAGNOSIS — I10 Essential (primary) hypertension: Secondary | ICD-10-CM | POA: Diagnosis not present

## 2017-05-05 DIAGNOSIS — Z0000 Encounter for general adult medical examination without abnormal findings: Secondary | ICD-10-CM | POA: Diagnosis not present

## 2017-05-05 DIAGNOSIS — E7801 Familial hypercholesterolemia: Secondary | ICD-10-CM | POA: Diagnosis not present

## 2017-05-05 DIAGNOSIS — Z6827 Body mass index (BMI) 27.0-27.9, adult: Secondary | ICD-10-CM | POA: Diagnosis not present

## 2017-05-05 DIAGNOSIS — E1165 Type 2 diabetes mellitus with hyperglycemia: Secondary | ICD-10-CM | POA: Diagnosis not present

## 2017-05-07 DIAGNOSIS — E870 Hyperosmolality and hypernatremia: Secondary | ICD-10-CM | POA: Diagnosis not present

## 2017-05-07 DIAGNOSIS — D638 Anemia in other chronic diseases classified elsewhere: Secondary | ICD-10-CM | POA: Diagnosis not present

## 2017-05-07 DIAGNOSIS — N184 Chronic kidney disease, stage 4 (severe): Secondary | ICD-10-CM | POA: Diagnosis not present

## 2017-05-07 DIAGNOSIS — E875 Hyperkalemia: Secondary | ICD-10-CM | POA: Diagnosis not present

## 2017-05-07 DIAGNOSIS — E559 Vitamin D deficiency, unspecified: Secondary | ICD-10-CM | POA: Diagnosis not present

## 2017-05-07 DIAGNOSIS — R809 Proteinuria, unspecified: Secondary | ICD-10-CM | POA: Diagnosis not present

## 2017-05-08 NOTE — Patient Instructions (Signed)
Andrew Zhang  05/08/2017     @PREFPERIOPPHARMACY @   Your procedure is scheduled on 05/14/2017.  Report to [name] at 11:00 A.M.  Call this number if you have problems the morning of surgery:  [phone]   Remember:  Do not eat food or drink liquids after midnight.  Take these medicines the morning of surgery with A SIP OF WATER Proscar, Prilosec, Zoloft, Flomax  DO NOT TAKE DIABETIC MEDICATIONS DAY OF PROCEDURE   Do not wear jewelry, make-up or nail polish.  Do not wear lotions, powders, or perfumes, or deoderant.  Do not shave 48 hours prior to surgery.  Men may shave face and neck.  Do not bring valuables to the hospital.  [facility] is not responsible for any belongings or valuables.  Contacts, dentures or bridgework may not be worn into surgery.  Leave your suitcase in the car.  After surgery it may be brought to your room.  For patients admitted to the hospital, discharge time will be determined by your treatment team.  Patients discharged the day of surgery will not be allowed to drive home.    Please read over the following fact sheets that you were given. Surgical Site Infection Prevention and Anesthesia Post-op Instructions     PATIENT INSTRUCTIONS POST-ANESTHESIA  IMMEDIATELY FOLLOWING SURGERY:  Do not drive or operate machinery for the first twenty four hours after surgery.  Do not make any important decisions for twenty four hours after surgery or while taking narcotic pain medications or sedatives.  If you develop intractable nausea and vomiting or a severe headache please notify your doctor immediately.  FOLLOW-UP:  Please make an appointment with your surgeon as instructed. You do not need to follow up with anesthesia unless specifically instructed to do so.  WOUND CARE INSTRUCTIONS (if applicable):  Keep a dry clean dressing on the anesthesia/puncture wound site if there is drainage.  Once the wound has quit draining you may leave it open to air.   Generally you should leave the bandage intact for twenty four hours unless there is drainage.  If the epidural site drains for more than 36-48 hours please call the anesthesia department.  QUESTIONS?:  Please feel free to call your physician or the hospital operator if you have any questions, and they will be happy to assist you.      Ureteral Stent Implantation Ureteral stent implantation is a procedure to insert (implant) a flexible, soft, plastic tube (stent) into a tube (ureter) that drains urine from the kidneys. The stent supports the ureter while it heals and helps to drain urine from the kidneys. You may have a ureteral stent implanted after having a procedure to remove a blockage from the ureter (ureterolysis or pyeloplasty).You may also have a stent implanted to open the flow of urine when you have a blockage caused by a kidney stone, tumor, blood clot, or infection. You have two ureters, one on each side of the body. The ureters connect the kidneys to the organ that holds urine until it passes out of the body (bladder). The stent is placed so that one end is in the kidney, and one end is in the bladder. The stent is usually taken out after your ureter has healed. Depending on your condition, you may have a stent for just a few weeks, or you may have a long-term stent that will need to be replaced every few months. Tell a health care provider about:  Any allergies you have.  All medicines you are taking, including vitamins, herbs, eye drops, creams, and over-the-counter medicines.  Any problems you or family members have had with anesthetic medicines.  Any blood disorders you have.  Any surgeries you have had.  Any medical conditions you have.  Whether you are pregnant or may be pregnant. What are the risks? Generally, this is a safe procedure. However, problems may occur, including:  Infection.  Bleeding.  Allergic reactions to medicines.  Damage to other structures or  organs. Tearing (perforation) of the ureter is possible.  Movement of the stent away from where it is placed during surgery (migration). What happens before the procedure?  Ask your health care provider about:  Changing or stopping your regular medicines. This is especially important if you are taking diabetes medicines or blood thinners.  Taking medicines such as aspirin and ibuprofen. These medicines can thin your blood. Do not take these medicines before your procedure if your health care provider instructs you not to.  Follow instructions from your health care provider about eating or drinking restrictions.  Do not drink alcohol and do not use any tobacco products before your procedure, as told by your health care provider.  You may be given antibiotic medicine to help prevent infection.  Plan to have someone take you home after the procedure.  If you go home right after the procedure, plan to have someone with you for 24 hours. What happens during the procedure?  An IV tube will be inserted into one of your veins.  You will be given a medicine to make you fall asleep (general anesthetic). You may also be given a medicine to help you relax (sedative).  A thin, tube-shaped instrument with a light and tiny camera at the end (cystoscope) will be inserted into your urethra. The urethra is the tube that drains urine from the bladder out of the body. In men, the urethra opens at the end of the penis. In women, the urethra opens in front of the vaginal opening.  The cystoscope will be passed into your bladder.  A thin wire (guide wire) will be passed through your bladder and into your ureter. This is used to guide the stent into your ureter.  The stent will be inserted into your ureter.  The guide wire and the cystoscope will be removed.  A flexible tube (catheter) will be inserted through your urethra so that one end is in your bladder. This helps to drain urine from your  bladder. The procedure may vary among hospitals and health care providers. What happens after the procedure?  Your blood pressure, heart rate, breathing rate, and blood oxygen level will be monitored often until the medicines you were given have worn off.  You may continue to receive medicine and fluids through an IV tube.  You may have some soreness or pain in your abdomen and urethra. Medicines will be available to help you.  You will be encouraged to get up and walk around as soon as you can.  You will have a catheter draining your urine.  You will have some blood in your urine.  Do not drive for 24 hours if you received a sedative. This information is not intended to replace advice given to you by your health care provider. Make sure you discuss any questions you have with your health care provider. Document Released: 12/13/2000 Document Revised: 05/23/2016 Document Reviewed: 06/30/2015 Elsevier Interactive Patient Education  2017 [employer] Cystoscopy Cystoscopy is a procedure that is used  to help diagnose and sometimes treat conditions that affect that lower urinary tract. The lower urinary tract includes the bladder and the tube that drains urine from the bladder out of the body (urethra). Cystoscopy is performed with a thin, tube-shaped instrument with a light and camera at the end (cystoscope). The cystoscope may be hard (rigid) or flexible, depending on the goal of the procedure.The cystoscope is inserted through the urethra, into the bladder. Cystoscopy may be recommended if you have:  Urinary tractinfections that keep coming back (recurring).  Blood in the urine (hematuria).  Loss of bladder control (urinary incontinence) or an overactive bladder.  Unusual cells found in a urine sample.  A blockage in the urethra.  Painful urination.  An abnormality in the bladder found during an intravenous pyelogram (IVP) or CT scan. Cystoscopy may also be done to remove a  sample of tissue to be examined under a microscope (biopsy). Tell a health care provider about:  Any allergies you have.  All medicines you are taking, including vitamins, herbs, eye drops, creams, and over-the-counter medicines.  Any problems you or family members have had with anesthetic medicines.  Any blood disorders you have.  Any surgeries you have had.  Any medical conditions you have.  Whether you are pregnant or may be pregnant. What are the risks? Generally, this is a safe procedure. However, problems may occur, including:  Infection.  Bleeding.  Allergic reactions to medicines.  Damage to other structures or organs. What happens before the procedure?  Ask your health care provider about:  Changing or stopping your regular medicines. This is especially important if you are taking diabetes medicines or blood thinners.  Taking medicines such as aspirin and ibuprofen. These medicines can thin your blood. Do not take these medicines before your procedure if your health care provider instructs you not to.  Follow instructions from your health care provider about eating or drinking restrictions.  You may be given antibiotic medicine to help prevent infection.  You may have an exam or testing, such as X-rays of the bladder, urethra, or kidneys.  You may have urine tests to check for signs of infection.  Plan to have someone take you home after the procedure. What happens during the procedure?  To reduce your risk of infection,your health care team will wash or sanitize their hands.  You will be given one or more of the following:  A medicine to help you relax (sedative).  A medicine to numb the area (local anesthetic).  The area around the opening of your urethra will be cleaned.  The cystoscope will be passed through your urethra into your bladder.  Germ-free (sterile)fluid will flow through the cystoscope to fill your bladder. The fluid will stretch  your bladder so that your surgeon can clearly examine your bladder walls.  The cystoscope will be removed and your bladder will be emptied. The procedure may vary among health care providers and hospitals. What happens after the procedure?  You may have some soreness or pain in your abdomen and urethra. Medicines will be available to help you.  You may have some blood in your urine.  Do not drive for 24 hours if you received a sedative. This information is not intended to replace advice given to you by your health care provider. Make sure you discuss any questions you have with your health care provider. Document Released: 12/13/2000 Document Revised: 04/25/2016 Document Reviewed: 11/02/2015 Elsevier Interactive Patient Education  2017 [employer].

## 2017-05-12 ENCOUNTER — Encounter (HOSPITAL_COMMUNITY): Payer: Self-pay

## 2017-05-12 ENCOUNTER — Other Ambulatory Visit (HOSPITAL_COMMUNITY): Payer: Medicare Other

## 2017-05-12 ENCOUNTER — Encounter (HOSPITAL_COMMUNITY)
Admission: RE | Admit: 2017-05-12 | Discharge: 2017-05-12 | Disposition: A | Discharge status: Home / Self Care | Payer: Medicare Other | Source: Ambulatory Visit | Attending: Urology | Admitting: Urology

## 2017-05-12 DIAGNOSIS — Z7984 Long term (current) use of oral hypoglycemic drugs: Secondary | ICD-10-CM | POA: Diagnosis not present

## 2017-05-12 DIAGNOSIS — E1151 Type 2 diabetes mellitus with diabetic peripheral angiopathy without gangrene: Secondary | ICD-10-CM | POA: Diagnosis not present

## 2017-05-12 DIAGNOSIS — Z466 Encounter for fitting and adjustment of urinary device: Secondary | ICD-10-CM | POA: Diagnosis not present

## 2017-05-12 DIAGNOSIS — N131 Hydronephrosis with ureteral stricture, not elsewhere classified: Secondary | ICD-10-CM | POA: Diagnosis not present

## 2017-05-12 DIAGNOSIS — Z7982 Long term (current) use of aspirin: Secondary | ICD-10-CM | POA: Diagnosis not present

## 2017-05-12 DIAGNOSIS — F329 Major depressive disorder, single episode, unspecified: Secondary | ICD-10-CM | POA: Diagnosis not present

## 2017-05-12 DIAGNOSIS — I10 Essential (primary) hypertension: Secondary | ICD-10-CM | POA: Diagnosis not present

## 2017-05-12 DIAGNOSIS — E785 Hyperlipidemia, unspecified: Secondary | ICD-10-CM | POA: Diagnosis not present

## 2017-05-12 DIAGNOSIS — Z87442 Personal history of urinary calculi: Secondary | ICD-10-CM | POA: Diagnosis not present

## 2017-05-12 DIAGNOSIS — G4730 Sleep apnea, unspecified: Secondary | ICD-10-CM | POA: Diagnosis not present

## 2017-05-12 DIAGNOSIS — Z79899 Other long term (current) drug therapy: Secondary | ICD-10-CM | POA: Diagnosis not present

## 2017-05-12 DIAGNOSIS — I2510 Atherosclerotic heart disease of native coronary artery without angina pectoris: Secondary | ICD-10-CM | POA: Diagnosis not present

## 2017-05-12 DIAGNOSIS — Z8546 Personal history of malignant neoplasm of prostate: Secondary | ICD-10-CM | POA: Diagnosis not present

## 2017-05-12 DIAGNOSIS — K219 Gastro-esophageal reflux disease without esophagitis: Secondary | ICD-10-CM | POA: Diagnosis not present

## 2017-05-12 DIAGNOSIS — Z923 Personal history of irradiation: Secondary | ICD-10-CM | POA: Diagnosis not present

## 2017-05-12 DIAGNOSIS — Z951 Presence of aortocoronary bypass graft: Secondary | ICD-10-CM | POA: Diagnosis not present

## 2017-05-12 DIAGNOSIS — Z87891 Personal history of nicotine dependence: Secondary | ICD-10-CM | POA: Diagnosis not present

## 2017-05-12 LAB — CBC WITH DIFFERENTIAL/PLATELET
BASOS PCT: 0 %
Basophils Absolute: 0 10*3/uL (ref 0.0–0.1)
EOS ABS: 0.2 10*3/uL (ref 0.0–0.7)
Eosinophils Relative: 4 %
HCT: 30.4 % — ABNORMAL LOW (ref 39.0–52.0)
HEMOGLOBIN: 10.3 g/dL — AB (ref 13.0–17.0)
Lymphocytes Relative: 20 %
Lymphs Abs: 1.1 10*3/uL (ref 0.7–4.0)
MCH: 31.9 pg (ref 26.0–34.0)
MCHC: 33.9 g/dL (ref 30.0–36.0)
MCV: 94.1 fL (ref 78.0–100.0)
Monocytes Absolute: 0.4 10*3/uL (ref 0.1–1.0)
Monocytes Relative: 8 %
NEUTROS PCT: 68 %
Neutro Abs: 3.6 10*3/uL (ref 1.7–7.7)
Platelets: 144 10*3/uL — ABNORMAL LOW (ref 150–400)
RBC: 3.23 MIL/uL — AB (ref 4.22–5.81)
RDW: 13.9 % (ref 11.5–15.5)
WBC: 5.3 10*3/uL (ref 4.0–10.5)

## 2017-05-12 LAB — BASIC METABOLIC PANEL
Anion gap: 9 (ref 5–15)
BUN: 43 mg/dL — ABNORMAL HIGH (ref 6–20)
CHLORIDE: 107 mmol/L (ref 101–111)
CO2: 23 mmol/L (ref 22–32)
CREATININE: 2.18 mg/dL — AB (ref 0.61–1.24)
Calcium: 9.5 mg/dL (ref 8.9–10.3)
GFR, EST AFRICAN AMERICAN: 31 mL/min — AB (ref >60)
GFR, EST NON AFRICAN AMERICAN: 27 mL/min — AB (ref >60)
Glucose, Bld: 123 mg/dL — ABNORMAL HIGH (ref 65–99)
POTASSIUM: 4.5 mmol/L (ref 3.5–5.1)
SODIUM: 139 mmol/L (ref 135–145)

## 2017-05-12 LAB — RENAL FUNCTION PANEL
ALBUMIN: 3.7 g/dL (ref 3.5–5.0)
ANION GAP: 8 (ref 5–15)
BUN: 43 mg/dL — ABNORMAL HIGH (ref 6–20)
CALCIUM: 9.5 mg/dL (ref 8.9–10.3)
CO2: 23 mmol/L (ref 22–32)
Chloride: 107 mmol/L (ref 101–111)
Creatinine, Ser: 2.13 mg/dL — ABNORMAL HIGH (ref 0.61–1.24)
GFR calc non Af Amer: 27 mL/min — ABNORMAL LOW (ref >60)
GFR, EST AFRICAN AMERICAN: 32 mL/min — AB (ref >60)
GLUCOSE: 122 mg/dL — AB (ref 65–99)
PHOSPHORUS: 3.1 mg/dL (ref 2.5–4.6)
POTASSIUM: 4.5 mmol/L (ref 3.5–5.1)
SODIUM: 138 mmol/L (ref 135–145)

## 2017-05-14 ENCOUNTER — Ambulatory Visit (HOSPITAL_COMMUNITY): Payer: Medicare Other

## 2017-05-14 ENCOUNTER — Encounter (HOSPITAL_COMMUNITY)
Admission: RE | Disposition: A | Discharge status: Home / Self Care | Payer: Self-pay | Source: Ambulatory Visit | Attending: Urology

## 2017-05-14 ENCOUNTER — Ambulatory Visit (HOSPITAL_COMMUNITY)
Admission: RE | Admit: 2017-05-14 | Discharge: 2017-05-14 | Disposition: A | Discharge status: Home / Self Care | Payer: Medicare Other | Source: Ambulatory Visit | Attending: Urology | Admitting: Urology

## 2017-05-14 ENCOUNTER — Encounter (HOSPITAL_COMMUNITY): Payer: Self-pay | Admitting: *Deleted

## 2017-05-14 ENCOUNTER — Ambulatory Visit (HOSPITAL_COMMUNITY): Payer: Medicare Other | Admitting: Anesthesiology

## 2017-05-14 DIAGNOSIS — K219 Gastro-esophageal reflux disease without esophagitis: Secondary | ICD-10-CM | POA: Insufficient documentation

## 2017-05-14 DIAGNOSIS — E1151 Type 2 diabetes mellitus with diabetic peripheral angiopathy without gangrene: Secondary | ICD-10-CM | POA: Insufficient documentation

## 2017-05-14 DIAGNOSIS — Z7982 Long term (current) use of aspirin: Secondary | ICD-10-CM | POA: Insufficient documentation

## 2017-05-14 DIAGNOSIS — N135 Crossing vessel and stricture of ureter without hydronephrosis: Secondary | ICD-10-CM

## 2017-05-14 DIAGNOSIS — N358 Other urethral stricture: Secondary | ICD-10-CM | POA: Diagnosis not present

## 2017-05-14 DIAGNOSIS — E785 Hyperlipidemia, unspecified: Secondary | ICD-10-CM | POA: Diagnosis not present

## 2017-05-14 DIAGNOSIS — Z8546 Personal history of malignant neoplasm of prostate: Secondary | ICD-10-CM | POA: Insufficient documentation

## 2017-05-14 DIAGNOSIS — Z466 Encounter for fitting and adjustment of urinary device: Secondary | ICD-10-CM | POA: Diagnosis not present

## 2017-05-14 DIAGNOSIS — I2510 Atherosclerotic heart disease of native coronary artery without angina pectoris: Secondary | ICD-10-CM | POA: Diagnosis not present

## 2017-05-14 DIAGNOSIS — Z7984 Long term (current) use of oral hypoglycemic drugs: Secondary | ICD-10-CM | POA: Insufficient documentation

## 2017-05-14 DIAGNOSIS — F329 Major depressive disorder, single episode, unspecified: Secondary | ICD-10-CM | POA: Diagnosis not present

## 2017-05-14 DIAGNOSIS — Z923 Personal history of irradiation: Secondary | ICD-10-CM | POA: Diagnosis not present

## 2017-05-14 DIAGNOSIS — Z79899 Other long term (current) drug therapy: Secondary | ICD-10-CM | POA: Insufficient documentation

## 2017-05-14 DIAGNOSIS — I10 Essential (primary) hypertension: Secondary | ICD-10-CM | POA: Diagnosis not present

## 2017-05-14 DIAGNOSIS — N131 Hydronephrosis with ureteral stricture, not elsewhere classified: Secondary | ICD-10-CM | POA: Insufficient documentation

## 2017-05-14 DIAGNOSIS — Z87891 Personal history of nicotine dependence: Secondary | ICD-10-CM | POA: Insufficient documentation

## 2017-05-14 DIAGNOSIS — Z87442 Personal history of urinary calculi: Secondary | ICD-10-CM | POA: Insufficient documentation

## 2017-05-14 DIAGNOSIS — G4730 Sleep apnea, unspecified: Secondary | ICD-10-CM | POA: Diagnosis not present

## 2017-05-14 DIAGNOSIS — Z951 Presence of aortocoronary bypass graft: Secondary | ICD-10-CM | POA: Insufficient documentation

## 2017-05-14 HISTORY — PX: CYSTOSCOPY W/ URETERAL STENT PLACEMENT: SHX1429

## 2017-05-14 LAB — GLUCOSE, CAPILLARY
GLUCOSE-CAPILLARY: 146 mg/dL — AB (ref 65–99)
Glucose-Capillary: 127 mg/dL — ABNORMAL HIGH (ref 65–99)

## 2017-05-14 SURGERY — CYSTOSCOPY, WITH RETROGRADE PYELOGRAM AND URETERAL STENT INSERTION
Anesthesia: General | Site: Ureter | Laterality: Left

## 2017-05-14 MED ORDER — LIDOCAINE HCL 1 % IJ SOLN: INTRAMUSCULAR | Status: DC | PRN

## 2017-05-14 MED ORDER — ONDANSETRON HCL 4 MG/2ML IJ SOLN
4.0000 mg | Freq: Once | INTRAMUSCULAR | Status: AC
  Administered 2017-05-14: 4 mg via INTRAVENOUS

## 2017-05-14 MED ORDER — LACTATED RINGERS IV SOLN
INTRAVENOUS | Status: DC
  Administered 2017-05-14 (×2): via INTRAVENOUS

## 2017-05-14 MED ORDER — MIDAZOLAM HCL 2 MG/2ML IJ SOLN
1.0000 mg | INTRAMUSCULAR | Status: AC
  Administered 2017-05-14: 2 mg via INTRAVENOUS

## 2017-05-14 MED ORDER — CEFAZOLIN SODIUM-DEXTROSE 1-4 GM/50ML-% IV SOLN
INTRAVENOUS | Status: AC
  Filled 2017-05-14: qty 50

## 2017-05-14 MED ORDER — FENTANYL CITRATE (PF) 100 MCG/2ML IJ SOLN: 25.0000 ug | INTRAMUSCULAR | Status: DC | PRN

## 2017-05-14 MED ORDER — CEFAZOLIN SODIUM-DEXTROSE 2-4 GM/100ML-% IV SOLN
2.0000 g | Freq: Once | INTRAVENOUS | Status: AC
  Administered 2017-05-14: 2 g via INTRAVENOUS

## 2017-05-14 MED ORDER — MIDAZOLAM HCL 2 MG/2ML IJ SOLN
INTRAMUSCULAR | Status: AC
  Filled 2017-05-14: qty 2

## 2017-05-14 MED ORDER — STERILE WATER FOR IRRIGATION IR SOLN
Status: DC | PRN
  Administered 2017-05-14: 1000 mL

## 2017-05-14 MED ORDER — LIDOCAINE HCL (PF) 1 % IJ SOLN
INTRAMUSCULAR | Status: AC
  Filled 2017-05-14: qty 5

## 2017-05-14 MED ORDER — DIATRIZOATE MEGLUMINE 30 % UR SOLN
URETHRAL | Status: DC | PRN
  Administered 2017-05-14: 10 mL via URETHRAL

## 2017-05-14 MED ORDER — FENTANYL CITRATE (PF) 100 MCG/2ML IJ SOLN
INTRAMUSCULAR | Status: DC | PRN
  Administered 2017-05-14: 50 ug via INTRAVENOUS

## 2017-05-14 MED ORDER — PROPOFOL 10 MG/ML IV BOLUS
INTRAVENOUS | Status: DC | PRN
  Administered 2017-05-14: 120 mg via INTRAVENOUS

## 2017-05-14 MED ORDER — ONDANSETRON HCL 4 MG/2ML IJ SOLN
INTRAMUSCULAR | Status: AC
  Filled 2017-05-14: qty 2

## 2017-05-14 MED ORDER — LIDOCAINE HCL 2 % EX GEL
CUTANEOUS | Status: AC
  Filled 2017-05-14: qty 10

## 2017-05-14 MED ORDER — MIDAZOLAM HCL 5 MG/5ML IJ SOLN
INTRAMUSCULAR | Status: DC | PRN
  Administered 2017-05-14: 2 mg via INTRAVENOUS

## 2017-05-14 MED ORDER — TRAMADOL HCL 50 MG PO TABS: 50.0000 mg | ORAL_TABLET | Freq: Four times a day (QID) | ORAL | 0 refills | Status: DC | PRN

## 2017-05-14 MED ORDER — FENTANYL CITRATE (PF) 100 MCG/2ML IJ SOLN
INTRAMUSCULAR | Status: AC
  Filled 2017-05-14: qty 2

## 2017-05-14 MED ORDER — PROPOFOL 10 MG/ML IV BOLUS
INTRAVENOUS | Status: AC
  Filled 2017-05-14: qty 20

## 2017-05-14 MED ORDER — SODIUM CHLORIDE 0.9 % IR SOLN
Status: DC | PRN
  Administered 2017-05-14: 3000 mL

## 2017-05-14 MED ORDER — DIATRIZOATE MEGLUMINE 30 % UR SOLN
URETHRAL | Status: AC
  Filled 2017-05-14: qty 300

## 2017-05-14 SURGICAL SUPPLY — 21 items
BAG DRAIN URO TABLE W/ADPT NS (DRAPE) ×3 IMPLANT
BAG HAMPER (MISCELLANEOUS) ×3 IMPLANT
CLOTH BEACON ORANGE TIMEOUT ST (SAFETY) ×3 IMPLANT
DECANTER SPIKE VIAL GLASS SM (MISCELLANEOUS) ×3 IMPLANT
GLOVE BIO SURGEON STRL SZ8 (GLOVE) ×3 IMPLANT
GLOVE BIOGEL PI IND STRL 7.0 (GLOVE) ×1 IMPLANT
GLOVE BIOGEL PI INDICATOR 7.0 (GLOVE) ×2
GLOVE ECLIPSE 7.0 STRL STRAW (GLOVE) ×3 IMPLANT
GOWN STRL REUS W/ TWL XL LVL3 (GOWN DISPOSABLE) ×1 IMPLANT
GOWN STRL REUS W/TWL LRG LVL3 (GOWN DISPOSABLE) ×3 IMPLANT
GOWN STRL REUS W/TWL XL LVL3 (GOWN DISPOSABLE) ×2
GUIDEWIRE ANG ZIPWIRE 038X150 (WIRE) ×3 IMPLANT
GUIDEWIRE STR DUAL SENSOR (WIRE) ×3 IMPLANT
IV NS IRRIG 3000ML ARTHROMATIC (IV SOLUTION) ×3 IMPLANT
KIT ROOM TURNOVER AP CYSTO (KITS) ×3 IMPLANT
MANIFOLD NEPTUNE II (INSTRUMENTS) ×3 IMPLANT
PACK CYSTO (CUSTOM PROCEDURE TRAY) ×3 IMPLANT
PAD ARMBOARD 7.5X6 YLW CONV (MISCELLANEOUS) ×3 IMPLANT
STENT CONTOUR 7FRX26X.035 (STENTS) ×3 IMPLANT
SYRINGE 10CC LL (SYRINGE) ×3 IMPLANT
TOWEL OR 17X26 4PK STRL BLUE (TOWEL DISPOSABLE) ×3 IMPLANT

## 2017-05-14 NOTE — Op Note (Signed)
.  Preoperative diagnosis: Left ureteral stricture  Postoperative diagnosis: Same  Procedure: 1 cystoscopy 2. Left retrograde pyelography 3.  Intraoperative fluoroscopy, under one hour, with interpretation 4. Left 7 x 26 JJ stent exchange  Attending: Nicolette Bang  Anesthesia: General  Estimated blood loss: None  Drains: Left 7 x 26 JJ ureteral stent without tether,   Specimens: none  Antibiotics: Ancef  Findings: left mid ureteral stricture. Moderate hydronephrosis. No masses/lesions in the bladder. Ureteral orifices in normal anatomic location.  Indications: Patient is a 83 year old male with a history of left ureteral stricture managed with stent exchange.  After discussing treatment options, they decided proceed with left stent exchange.  Procedure her in detail: The patient was brought to the operating room and a brief timeout was done to ensure correct patient, correct procedure, correct site.  General anesthesia was administered patient was placed in dorsal lithotomy position.  Their genitalia was then prepped and draped in usual sterile fashion.  A rigid 54 French cystoscope was passed in the urethra and the bladder.  Bladder was inspected free masses or lesions.  the ureteral orifices were in the normal orthotopic locations. Using a grasper the ureteral stent was brought to the urethral meatus. A zip wire was advanced through the stent and up to the renal pelvis and the stent was removed.  a 6 french ureteral catheter was then instilled into the left ureteral orifice.  a gentle retrograde was obtained and findings noted above.    We then placed a 7 x 26 double-j ureteral stent over the original zip wire.  We then removed the wire and good coil was noted in the the renal pelvis under fluoroscopy and the bladder under direct vision.  A foley catheter was then placed. the bladder was then drained and this concluded the procedure which was well tolerated by patient.  Complications:  None  Condition: Stable, extubated, transferred to PACU  Plan: Patient is to be discharged home and followup in 1 month

## 2017-05-14 NOTE — Anesthesia Preprocedure Evaluation (Signed)
Anesthesia Evaluation  Patient identified by MRN, date of birth, ID band Patient awake    Reviewed: Allergy & Precautions, NPO status , Patient's Chart, lab work & pertinent test results  History of Anesthesia Complications (+) PONV and history of anesthetic complications  Airway Mallampati: I  TM Distance: >3 FB     Dental  (+) Edentulous Upper, Partial Lower   Pulmonary sleep apnea , former smoker,    breath sounds clear to auscultation       Cardiovascular hypertension, Pt. on medications + CAD, + CABG and + Peripheral Vascular Disease   Rhythm:Regular Rate:Normal     Neuro/Psych PSYCHIATRIC DISORDERS Depression    GI/Hepatic hiatal hernia, GERD  Controlled and Medicated,  Endo/Other  diabetes, Type 2  Renal/GU Renal disease     Musculoskeletal  (+) Arthritis ,   Abdominal   Peds  Hematology  (+) anemia ,   Anesthesia Other Findings   Reproductive/Obstetrics                             Anesthesia Physical Anesthesia Plan  ASA: III  Anesthesia Plan: General   Post-op Pain Management:    Induction: Intravenous  Airway Management Planned: LMA  Additional Equipment:   Intra-op Plan:   Post-operative Plan: Extubation in OR  Informed Consent: I have reviewed the patients History and Physical, chart, labs and discussed the procedure including the risks, benefits and alternatives for the proposed anesthesia with the patient or authorized representative who has indicated his/her understanding and acceptance.     Plan Discussed with:   Anesthesia Plan Comments:         Anesthesia Quick Evaluation

## 2017-05-14 NOTE — OR Nursing (Signed)
Discharge instructions reviewed with patient and his wife. Rx given. Patient given time to ask questions and verbalizes understanding of follow up and instructions. Unable to sign out due to power outage.

## 2017-05-14 NOTE — Anesthesia Postprocedure Evaluation (Signed)
Anesthesia Post Note  Patient: Andrew Zhang  Procedure(s) Performed: Procedure(s) (LRB): CYSTOSCOPY WITH RETROGRADE PYELOGRAM/URETERAL STENT EXCHANGE (Left)  Patient location during evaluation: PACU Anesthesia Type: General Level of consciousness: awake and patient cooperative Pain management: pain level controlled Vital Signs Assessment: post-procedure vital signs reviewed and stable Respiratory status: spontaneous breathing, nonlabored ventilation and respiratory function stable Cardiovascular status: blood pressure returned to baseline Postop Assessment: no signs of nausea or vomiting Anesthetic complications: no     Last Vitals:  Vitals:   05/14/17 1255 05/14/17 1300  BP:    Resp: 15 18    Last Pain: There were no vitals filed for this visit.               Laurell Coalson J

## 2017-05-14 NOTE — H&P (Signed)
Urology Admission H&P  Chief Complaint: left ureteral stricture  History of Present Illness: [name] is a 83yo with left ureteral stricture managed with chronic stent changes  Past Medical History:  Diagnosis Date  . AAA (abdominal aortic aneurysm) ([place])   . Anemia   . Arthritis   . B12 deficiency   . CAD (coronary artery disease)   . Cancer [facility])    radiation for prostate cancer and lupron.  . Complication of anesthesia    sts,"after hearat surgery my stomach didnt wake up like it was supossed to".  . Depression   . Diverticulitis   . DMII (diabetes mellitus, type 2) ([place]) 2008  . GERD (gastroesophageal reflux disease)   . Hiatal hernia   . History of kidney stones   . HTN (hypertension)   . Hyperlipidemia   . PONV (postoperative nausea and vomiting)   . Prostate cancer ([place])   . Sleep apnea    cannot tolerate, PCP aware   Past Surgical History:  Procedure Laterality Date  . APPENDECTOMY    . BACK SURGERY     x2  . CATARACT EXTRACTION Left   . CHOLECYSTECTOMY    . COLONOSCOPY  03/2015   Dr. Britta Mccreedy: Diverticulosis, single tubular adenoma removed.  . COLONOSCOPY WITH PROPOFOL N/A 02/25/2017   Procedure: COLONOSCOPY WITH PROPOFOL;  Surgeon: Danie Binder, MD;  Location[facility] ENDO SUITE;  Service: Endoscopy;  Laterality: N/A;  9:30am  . CORONARY ARTERY BYPASS GRAFT    . CYSTOSCOPY     with stent-left kidney. x2  . DESCENDING AORTIC ANEURYSM REPAIR W/ STENT     x2  . ESOPHAGOGASTRODUODENOSCOPY N/A 07/30/2016   Dr. Oneida Alar. Nonobstructing Schatzki ring at GE junction, multiple small sessile polyps with no bleeding or stigmata of recent bleeding in the gastric fundus and gastric body. Benign-appearing intrinsic moderate stenosis found the pylorus status post dilation. Small bowel capsule deployed.  Marland Kitchen GIVENS CAPSULE STUDY     Capsule study is complete to the cecum. No obvious lesions, mass, tumors. Small wisps of blood noted in small bowel secondary to EGD/dilation. Possible  few erosions in setting of NSAIDs. No obvious areas of bleeding.    Home Medications:  Prescriptions Prior to Admission  Medication Sig Dispense Refill Last Dose  . aspirin 81 MG tablet Chew 81 mg by mouth daily.   05/13/2017 at Unknown time  . Calcium Carbonate-Vitamin D (CALTRATE 600+D) 600-400 MG-UNIT per tablet Take 1 tablet by mouth daily.   05/13/2017 at Unknown time  . Cyanocobalamin 1000 MCG/ML LIQD Place 1 mL under the tongue daily.    05/13/2017 at Unknown time  . finasteride (PROSCAR) 5 MG tablet Take 5 mg by mouth daily.   05/14/2017 at Unknown time  . glimepiride (AMARYL) 2 MG tablet Take 2 mg by mouth daily with breakfast.    05/13/2017 at Unknown time  . omeprazole (PRILOSEC) 20 MG capsule Take 1 capsule (20 mg total) by mouth 2 (two) times daily before a meal. 60 capsule 0 05/14/2017 at Unknown time  . sertraline (ZOLOFT) 100 MG tablet Take 50 mg by mouth daily.   05/14/2017 at Unknown time  . simvastatin (ZOCOR) 80 MG tablet Take 80 mg by mouth at bedtime.   05/13/2017 at Unknown time  . tamsulosin (FLOMAX) 0.4 MG CAPS Take 0.4 mg by mouth daily after supper.   05/13/2017 at Unknown time  . nitroGLYCERIN (NITROSTAT) 0.4 MG SL tablet Place 1 tablet (0.4 mg total) under the tongue every 5 (five)  minutes as needed for chest pain. 25 tablet 3 More than a month at Unknown time   Allergies:  Allergies  Allergen Reactions  . Penicillins Rash    Family History  Problem Relation Age of Onset  . Heart disease Unknown   . Diabetes Unknown   . Hypertension Unknown   . Liver cancer Mother   . Colon cancer Neg Hx    Social History:  reports that he quit smoking about 23 years ago. His smoking use included Cigarettes. He started smoking about 66 years ago. He has a 135.00 pack-year smoking history. He has never used smokeless tobacco. He reports that he drinks alcohol. He reports that he does not use drugs.  Review of Systems  All other systems reviewed and are negative.   Physical  Exam:  Vital signs in last 24 hours:   Physical Exam  Constitutional: He is oriented to person, place, and time. He appears well-developed and well-nourished.  HENT:  Head: Normocephalic and atraumatic.  Eyes: EOM are normal. Pupils are equal, round, and reactive to light.  Neck: Normal range of motion. No thyromegaly present.  Cardiovascular: Normal rate and regular rhythm.   Respiratory: Effort normal. No respiratory distress.  GI: Soft. He exhibits no distension.  Musculoskeletal: Normal range of motion. He exhibits no edema.  Neurological: He is alert and oriented to person, place, and time.  Skin: Skin is warm and dry.  Psychiatric: He has a normal mood and affect. His behavior is normal. Judgment and thought content normal.    Laboratory Data:  Results for orders placed or performed during the hospital encounter of 05/14/17 (from the past 24 hour(s))  Glucose, capillary     Status: Abnormal   Collection Time: 05/14/17 11:21 AM  Result Value Ref Range   Glucose-Capillary 146 (H) 65 - 99 mg/dL   No results found for this or any previous visit (from the past 240 hour(s)). Creatinine:  Recent Labs  05/12/17 1452  CREATININE 2.13*  2.18*   Baseline Creatinine: 2.13  Impression/Assessment:  83yo with left ureteral stricture  Plan:  The risks/benefits/alternatives to left ureteral stent exchange was explained to patient and he understands and wishes to proceed with surgery  Nicolette Bang 05/14/2017, 12:51 PM

## 2017-05-14 NOTE — Transfer of Care (Signed)
Immediate Anesthesia Transfer of Care Note  Patient: Andrew Zhang  Procedure(s) Performed: Procedure(s): CYSTOSCOPY WITH RETROGRADE PYELOGRAM/URETERAL STENT EXCHANGE (Left)  Patient Location: PACU  Anesthesia Type:General  Level of Consciousness: awake and patient cooperative  Airway & Oxygen Therapy: Patient Spontanous Breathing and Patient connected to face mask oxygen  Post-op Assessment: Report given to RN, Post -op Vital signs reviewed and stable and Patient moving all extremities  Post vital signs: Reviewed and stable  Last Vitals:  Vitals:   05/14/17 1255 05/14/17 1300  BP:    Resp: 15 18    Last Pain: There were no vitals filed for this visit.       Complications: No apparent anesthesia complications

## 2017-05-14 NOTE — Anesthesia Procedure Notes (Signed)
Procedure Name: LMA Insertion Date/Time: 05/14/2017 1:08 PM Performed by: Charmaine Downs Pre-anesthesia Checklist: Patient identified, Patient being monitored, Emergency Drugs available, Timeout performed and Suction available Patient Re-evaluated:Patient Re-evaluated prior to inductionOxygen Delivery Method: Circle System Utilized Preoxygenation: Pre-oxygenation with 100% oxygen Intubation Type: IV induction Ventilation: Mask ventilation without difficulty LMA: LMA inserted LMA Size: 4.0 Number of attempts: 1 Placement Confirmation: positive ETCO2 and breath sounds checked- equal and bilateral

## 2017-05-14 NOTE — Discharge Instructions (Signed)
Ureteral Stent Implantation, Care After Refer to this sheet in the next few weeks. These instructions provide you with information about caring for yourself after your procedure. Your health care provider may also give you more specific instructions. Your treatment has been planned according to current medical practices, but problems sometimes occur. Call your health care provider if you have any problems or questions after your procedure. What can I expect after the procedure? After the procedure, it is common to have:  Nausea.  Mild pain when you urinate. You may feel this pain in your lower back or lower abdomen. Pain should stop within a few minutes after you urinate. This may last for up to 1 week.  A small amount of blood in your urine for several days. Follow these instructions at home:   Medicines   Take over-the-counter and prescription medicines only as told by your health care provider.  If you were prescribed an antibiotic medicine, take it as told by your health care provider. Do not stop taking the antibiotic even if you start to feel better.  Do not drive for 24 hours if you received a sedative.  Do not drive or operate heavy machinery while taking prescription pain medicines. Activity   Return to your normal activities as told by your health care provider. Ask your health care provider what activities are safe for you.  Do not lift anything that is heavier than 10 lb (4.5 kg). Follow this limit for 1 week after your procedure, or for as long as told by your health care provider. General instructions   Watch for any blood in your urine. Call your health care provider if the amount of blood in your urine increases.  If you have a catheter:  Follow instructions from your health care provider about taking care of your catheter and collection bag.  Do not take baths, swim, or use a hot tub until your health care provider approves.  Drink enough fluid to keep your urine  clear or pale yellow.  Keep all follow-up visits as told by your health care provider. This is important. Contact a health care provider if:  You have pain that gets worse or does not get better with medicine, especially pain when you urinate.  You have difficulty urinating.  You feel nauseous or you vomit repeatedly during a period of more than 2 days after the procedure. Get help right away if:  Your urine is dark red or has blood clots in it.  You are leaking urine (have incontinence).  The end of the stent comes out of your urethra.  You cannot urinate.  You have sudden, sharp, or severe pain in your abdomen or lower back.  You have a fever. This information is not intended to replace advice given to you by your health care provider. Make sure you discuss any questions you have with your health care provider. Document Released: 08/18/2013 Document Revised: 05/23/2016 Document Reviewed: 06/30/2015 Elsevier Interactive Patient Education  2017 [employer].

## 2017-05-16 ENCOUNTER — Encounter (HOSPITAL_COMMUNITY): Payer: Self-pay | Admitting: Urology

## 2017-05-28 ENCOUNTER — Ambulatory Visit (INDEPENDENT_AMBULATORY_CARE_PROVIDER_SITE_OTHER): Payer: Medicare Other | Admitting: Urology

## 2017-05-28 DIAGNOSIS — N131 Hydronephrosis with ureteral stricture, not elsewhere classified: Secondary | ICD-10-CM | POA: Diagnosis not present

## 2017-05-28 DIAGNOSIS — C61 Malignant neoplasm of prostate: Secondary | ICD-10-CM

## 2017-06-09 ENCOUNTER — Emergency Department (HOSPITAL_COMMUNITY)
Admission: EM | Admit: 2017-06-09 | Discharge: 2017-06-09 | Disposition: A | Discharge status: Home / Self Care | Payer: Medicare Other | Attending: Emergency Medicine | Admitting: Emergency Medicine

## 2017-06-09 ENCOUNTER — Encounter (HOSPITAL_COMMUNITY): Payer: Self-pay | Admitting: Emergency Medicine

## 2017-06-09 ENCOUNTER — Emergency Department (HOSPITAL_COMMUNITY): Payer: Medicare Other

## 2017-06-09 DIAGNOSIS — R05 Cough: Secondary | ICD-10-CM | POA: Diagnosis not present

## 2017-06-09 DIAGNOSIS — R531 Weakness: Secondary | ICD-10-CM | POA: Diagnosis not present

## 2017-06-09 DIAGNOSIS — I25810 Atherosclerosis of coronary artery bypass graft(s) without angina pectoris: Secondary | ICD-10-CM | POA: Insufficient documentation

## 2017-06-09 DIAGNOSIS — R079 Chest pain, unspecified: Secondary | ICD-10-CM | POA: Diagnosis not present

## 2017-06-09 DIAGNOSIS — E119 Type 2 diabetes mellitus without complications: Secondary | ICD-10-CM | POA: Insufficient documentation

## 2017-06-09 DIAGNOSIS — Z951 Presence of aortocoronary bypass graft: Secondary | ICD-10-CM | POA: Insufficient documentation

## 2017-06-09 DIAGNOSIS — Z7982 Long term (current) use of aspirin: Secondary | ICD-10-CM | POA: Insufficient documentation

## 2017-06-09 DIAGNOSIS — Z7984 Long term (current) use of oral hypoglycemic drugs: Secondary | ICD-10-CM | POA: Diagnosis not present

## 2017-06-09 DIAGNOSIS — Z79899 Other long term (current) drug therapy: Secondary | ICD-10-CM | POA: Diagnosis not present

## 2017-06-09 DIAGNOSIS — Z87891 Personal history of nicotine dependence: Secondary | ICD-10-CM | POA: Insufficient documentation

## 2017-06-09 DIAGNOSIS — N390 Urinary tract infection, site not specified: Secondary | ICD-10-CM | POA: Insufficient documentation

## 2017-06-09 DIAGNOSIS — I10 Essential (primary) hypertension: Secondary | ICD-10-CM | POA: Diagnosis not present

## 2017-06-09 DIAGNOSIS — Z8546 Personal history of malignant neoplasm of prostate: Secondary | ICD-10-CM | POA: Diagnosis not present

## 2017-06-09 LAB — CBC WITH DIFFERENTIAL/PLATELET
Basophils Absolute: 0 10*3/uL (ref 0.0–0.1)
Basophils Relative: 0 %
Eosinophils Absolute: 0 10*3/uL (ref 0.0–0.7)
Eosinophils Relative: 0 %
HEMATOCRIT: 30 % — AB (ref 39.0–52.0)
HEMOGLOBIN: 9.7 g/dL — AB (ref 13.0–17.0)
LYMPHS ABS: 0.9 10*3/uL (ref 0.7–4.0)
LYMPHS PCT: 10 %
MCH: 30.1 pg (ref 26.0–34.0)
MCHC: 32.3 g/dL (ref 30.0–36.0)
MCV: 93.2 fL (ref 78.0–100.0)
MONOS PCT: 9 %
Monocytes Absolute: 0.8 10*3/uL (ref 0.1–1.0)
NEUTROS PCT: 81 %
Neutro Abs: 7.4 10*3/uL (ref 1.7–7.7)
Platelets: 124 10*3/uL — ABNORMAL LOW (ref 150–400)
RBC: 3.22 MIL/uL — AB (ref 4.22–5.81)
RDW: 14.3 % (ref 11.5–15.5)
WBC: 9.1 10*3/uL (ref 4.0–10.5)

## 2017-06-09 LAB — COMPREHENSIVE METABOLIC PANEL
ALBUMIN: 3.4 g/dL — AB (ref 3.5–5.0)
ALT: 29 U/L (ref 17–63)
ANION GAP: 11 (ref 5–15)
AST: 31 U/L (ref 15–41)
Alkaline Phosphatase: 46 U/L (ref 38–126)
BILIRUBIN TOTAL: 1.1 mg/dL (ref 0.3–1.2)
BUN: 31 mg/dL — ABNORMAL HIGH (ref 6–20)
CHLORIDE: 102 mmol/L (ref 101–111)
CO2: 24 mmol/L (ref 22–32)
Calcium: 8.6 mg/dL — ABNORMAL LOW (ref 8.9–10.3)
Creatinine, Ser: 2.18 mg/dL — ABNORMAL HIGH (ref 0.61–1.24)
GFR calc Af Amer: 31 mL/min — ABNORMAL LOW (ref >60)
GFR calc non Af Amer: 27 mL/min — ABNORMAL LOW (ref >60)
GLUCOSE: 160 mg/dL — AB (ref 65–99)
POTASSIUM: 3.6 mmol/L (ref 3.5–5.1)
Sodium: 137 mmol/L (ref 135–145)
TOTAL PROTEIN: 7 g/dL (ref 6.5–8.1)

## 2017-06-09 LAB — URINALYSIS, ROUTINE W REFLEX MICROSCOPIC
BILIRUBIN URINE: NEGATIVE
Glucose, UA: NEGATIVE mg/dL
KETONES UR: NEGATIVE mg/dL
Nitrite: NEGATIVE
PH: 5 (ref 5.0–8.0)
PROTEIN: 100 mg/dL — AB
SQUAMOUS EPITHELIAL / LPF: NONE SEEN
Specific Gravity, Urine: 1.016 (ref 1.005–1.030)

## 2017-06-09 LAB — I-STAT CG4 LACTIC ACID, ED: LACTIC ACID, VENOUS: 1.74 mmol/L (ref 0.5–1.9)

## 2017-06-09 MED ORDER — SODIUM CHLORIDE 0.9 % IV BOLUS (SEPSIS)
500.0000 mL | Freq: Once | INTRAVENOUS | Status: AC
  Administered 2017-06-09: 500 mL via INTRAVENOUS

## 2017-06-09 MED ORDER — DOXYCYCLINE HYCLATE 100 MG PO CAPS: 100.0000 mg | ORAL_CAPSULE | Freq: Two times a day (BID) | ORAL | 0 refills | Status: DC

## 2017-06-09 NOTE — Discharge Instructions (Signed)
You may have a urinary tract infection. Watch for fevers or flank pain. Follow-up with urology.

## 2017-06-09 NOTE — ED Triage Notes (Signed)
Pt reports generalized weakness since yesterday.  States he started having jerking and shaking yesterday which has resolved.  States he has chronic anemia with unknown cause.

## 2017-06-09 NOTE — ED Notes (Signed)
Pt ambulated around nurses station with no difficulties.

## 2017-06-09 NOTE — ED Provider Notes (Signed)
[facility] DEPT Provider Note   CSN: [id number] Arrival date & time: 06/09/17  0906     History   Chief Complaint Chief Complaint  Patient presents with  . Weakness    HPI Andrew Zhang is a 83 y.o. male.  HPI Patient presents with generalized weakness. States began to feel bad yesterday. States he had chills and shaking. States it is improved somewhat. Also has been weak and reported difficulty getting out of bed. Rare cough. States he does not know if he had a fever. No dysuria. Does have ureteral stent. No nausea vomiting. Had slight chest pain yesterday has resolved.   Past Medical History:  Diagnosis Date  . AAA (abdominal aortic aneurysm) ([place])   . Anemia   . Arthritis   . B12 deficiency   . CAD (coronary artery disease)   . Cancer [facility])    radiation for prostate cancer and lupron.  . Complication of anesthesia    sts,"after hearat surgery my stomach didnt wake up like it was supossed to".  . Depression   . Diverticulitis   . DMII (diabetes mellitus, type 2) ([place]) 2008  . GERD (gastroesophageal reflux disease)   . Hiatal hernia   . History of kidney stones   . HTN (hypertension)   . Hyperlipidemia   . PONV (postoperative nausea and vomiting)   . Prostate cancer ([place])   . Sleep apnea    cannot tolerate, PCP aware    Patient Active Problem List   Diagnosis Date Noted  . Acute blood loss anemia   . Iron deficiency anemia due to chronic blood loss 01/08/2017  . GIB (gastrointestinal bleeding) 09/18/2016  . Acute on chronic renal failure ([place]) 07/29/2016  . Anemia 07/29/2016  . Adenocarcinoma of prostate ([place]) 09/06/2014  . Type 2 diabetes mellitus without complications ([place]) 78/24/2353  . BPH (benign prostatic hyperplasia) 12/14/2013  . Malignant neoplasm of prostate ([place]) 12/14/2013  . CAD (coronary artery disease) of artery bypass graft 08/27/2013  . HTN (hypertension) 08/27/2013  . Hyperlipidemia 08/27/2013  . S/P CABG x 3 08/27/2013  .  Abdominal aortic aneurysm ([place]) 04/22/2013    Past Surgical History:  Procedure Laterality Date  . APPENDECTOMY    . BACK SURGERY     x2  . CATARACT EXTRACTION Left   . CHOLECYSTECTOMY    . COLONOSCOPY  03/2015   Dr. Britta Mccreedy: Diverticulosis, single tubular adenoma removed.  . COLONOSCOPY WITH PROPOFOL N/A 02/25/2017   Procedure: COLONOSCOPY WITH PROPOFOL;  Surgeon: Danie Binder, MD;  Location: [facility] ENDO SUITE;  Service: Endoscopy;  Laterality: N/A;  9:30am  . CORONARY ARTERY BYPASS GRAFT    . CYSTOSCOPY     with stent-left kidney. x2  . CYSTOSCOPY W/ URETERAL STENT PLACEMENT Left 05/14/2017   Procedure: CYSTOSCOPY WITH RETROGRADE PYELOGRAM/URETERAL STENT EXCHANGE;  Surgeon: Cleon Gustin, MD;  Location: [facility] ORS;  Service: Urology;  Laterality: Left;  . DESCENDING AORTIC ANEURYSM REPAIR W/ STENT     x2  . ESOPHAGOGASTRODUODENOSCOPY N/A 07/30/2016   Dr. Oneida Alar. Nonobstructing Schatzki ring at GE junction, multiple small sessile polyps with no bleeding or stigmata of recent bleeding in the gastric fundus and gastric body. Benign-appearing intrinsic moderate stenosis found the pylorus status post dilation. Small bowel capsule deployed.  Marland Kitchen GIVENS CAPSULE STUDY     Capsule study is complete to the cecum. No obvious lesions, mass, tumors. Small wisps of blood noted in small bowel secondary to EGD/dilation. Possible few erosions in setting of NSAIDs. No obvious  areas of bleeding.       Home Medications    Prior to Admission medications   Medication Sig Start Date End Date Taking? Authorizing Provider  aspirin 81 MG tablet Chew 81 mg by mouth daily.   Yes [provider]  Calcium Carbonate-Vitamin D (CALTRATE 600+D) 600-400 MG-UNIT per tablet Take 1 tablet by mouth daily.   Yes [provider]  Cyanocobalamin 1000 MCG/ML LIQD Place 1 mL under the tongue daily.    Yes [provider]  finasteride (PROSCAR) 5 MG tablet Take 5 mg by mouth daily.   Yes [provider]  glimepiride (AMARYL) 2 MG tablet Take 2 mg by mouth daily with breakfast.    Yes [provider]  omeprazole (PRILOSEC) 20 MG capsule Take 1 capsule (20 mg total) by mouth 2 (two) times daily before a meal. 07/31/16  Yes Samuella Cota, MD  sertraline (ZOLOFT) 100 MG tablet Take 50 mg by mouth daily.   Yes [provider]  simvastatin (ZOCOR) 80 MG tablet Take 80 mg by mouth at bedtime.   Yes [provider]  tamsulosin (FLOMAX) 0.4 MG CAPS Take 0.4 mg by mouth daily after supper.   Yes [provider]  traMADol (ULTRAM) 50 MG tablet Take 1 tablet (50 mg total) by mouth every 6 (six) hours as needed. 05/14/17 05/14/18 Yes McKenzie, Candee Furbish, MD  doxycycline (VIBRAMYCIN) 100 MG capsule Take 1 capsule (100 mg total) by mouth 2 (two) times daily. 06/09/17   Davonna Belling, MD  nitroGLYCERIN (NITROSTAT) 0.4 MG SL tablet Place 1 tablet (0.4 mg total) under the tongue every 5 (five) minutes as needed for chest pain. 04/05/16   Herminio Commons, MD    Family History Family History  Problem Relation Age of Onset  . Heart disease Unknown   . Diabetes Unknown   . Hypertension Unknown   . Liver cancer Mother   . Colon cancer Neg Hx     Social History Social History  Substance Use Topics  . Smoking status: Former Smoker    Packs/day: 3.00    Years: 45.00    Types: Cigarettes    Start date: 04/19/1951    Quit date: 03/01/1994  . Smokeless tobacco: Never Used  . Alcohol use 0.0 oz/week     Comment: 1-2 beers daily      Allergies   Penicillins   Review of Systems Review of Systems  Constitutional: Positive for chills. Negative for appetite change and fever.  HENT: Negative for congestion.   Respiratory: Positive for cough.   Cardiovascular: Negative for chest pain.  Gastrointestinal: Negative for abdominal pain.  Genitourinary: Negative for dysuria and flank pain.  Musculoskeletal: Negative for back pain.  Skin: Negative for  rash.  Neurological: Positive for tremors.  Psychiatric/Behavioral: Negative for confusion.     Physical Exam Updated Vital Signs BP 127/63   Pulse 77   Temp 98.8 F (37.1 C) (Oral)   Resp (!) 23   SpO2 93%   Physical Exam  Constitutional: He appears well-developed.  HENT:  Head: Atraumatic.  Neck: Neck supple.  Cardiovascular: Normal rate.   Pulmonary/Chest: Effort normal.  Abdominal: Soft.  Musculoskeletal: He exhibits no edema.  Neurological: He is alert.  Skin: Capillary refill takes less than 2 seconds. No rash noted.  Psychiatric: He has a normal mood and affect.     ED Treatments / Results  Labs (all labs ordered are listed, but only abnormal results are displayed) Labs Reviewed  CBC WITH DIFFERENTIAL/PLATELET - Abnormal; Notable for the following:       Result Value   RBC 3.22 (*)    Hemoglobin 9.7 (*)    HCT 30.0 (*)    Platelets 124 (*)    All other components within normal limits  URINALYSIS, ROUTINE W REFLEX MICROSCOPIC - Abnormal; Notable for the following:    APPearance HAZY (*)    Hgb urine dipstick MODERATE (*)    Protein, ur 100 (*)    Leukocytes, UA MODERATE (*)    Bacteria, UA MANY (*)    All other components within normal limits  COMPREHENSIVE METABOLIC PANEL - Abnormal; Notable for the following:    Glucose, Bld 160 (*)    BUN 31 (*)    Creatinine, Ser 2.18 (*)    Calcium 8.6 (*)    Albumin 3.4 (*)    GFR calc non Af Amer 27 (*)    GFR calc Af Amer 31 (*)    All other components within normal limits  URINE CULTURE  I-STAT CG4 LACTIC ACID, ED    EKG  EKG Interpretation None       Radiology Dg Chest 2 View  Result Date: 06/09/2017 CLINICAL DATA:  Central chest pain. EXAM: CHEST  2 VIEW COMPARISON:  10/31/2016. FINDINGS: Trachea is midline. Heart size stable. Thoracic aorta is calcified. Lungs are hyperinflated. Biapical pleuroparenchymal scarring. Blunting of the costophrenic angles is likely chronic. IMPRESSION: Hyperinflation  without acute finding. Electronically Signed   By: Lorin Picket M.D.   On: 06/09/2017 09:57    Procedures Procedures (including critical care time)  Medications Ordered in ED Medications  sodium chloride 0.9 % bolus 500 mL (0 mLs Intravenous Stopped 06/09/17 1208)     Initial Impression / Assessment and Plan / ED Course  I have reviewed the triage vital signs and the nursing notes.  Pertinent labs & imaging results that were available during my care of the patient were reviewed by me and considered in my medical decision making (see chart for details).     Patient with chills and potentially Reiger's. Lab work reassuring without systemic white count but has possible urinary tract infection. Has ureteral stent on left side so this could be also because the abnormal urine. Discussed with Dr. Louis Meckel. Will treat with antibiotics. Will follow-up in the office. Well-appearing patient. Feels better after IV fluid bolus.  Final Clinical Impressions(s) / ED Diagnoses   Final diagnoses:  Generalized weakness  Lower urinary tract infectious disease    New Prescriptions Discharge Medication List as of 06/09/2017  1:08 PM    START taking these medications   Details  doxycycline (VIBRAMYCIN) 100 MG capsule Take 1 capsule (100 mg total) by mouth 2 (two) times daily., Starting Mon 06/09/2017, Print         Davonna Belling, MD 06/09/17 1517

## 2017-06-09 NOTE — ED Notes (Signed)
Pt back from x-ray.

## 2017-06-12 LAB — URINE CULTURE: Culture: >=100000 — AB

## 2017-06-13 ENCOUNTER — Telehealth: Payer: Self-pay

## 2017-06-13 NOTE — Telephone Encounter (Signed)
Post ED Visit - Positive Culture Follow-up: Successful Patient Follow-Up  Culture assessed and recommendations reviewed by: []  Elenor Quinones, Pharm.D. []  Heide Guile, Pharm.D., BCPS AQ-ID []  Parks Neptune, Pharm.D., BCPS [x]  Alycia Rossetti, Pharm.D., BCPS []  [place], Pharm.D., BCPS, AAHIVP []  Legrand Como, Pharm.D., BCPS, AAHIVP []  Salome Arnt, PharmD, BCPS []  Dimitri Ped, PharmD, BCPS []  Vincenza Hews, PharmD, BCPS  Positive urine culture  []  Patient discharged without antimicrobial prescription and treatment is now indicated [x]  Organism is resistant to prescribed ED discharge antimicrobial []  Patient with positive blood cultures  Changes discussed with ED provider: Monico Blitz PA-C New antibiotic prescription Levaquin 248mc daily x 5 days Called to [place]  Contacted patient, date 06/13/17, time [place]   Ilaria Much, Carolynn Comment 06/13/2017, 10:28 AM

## 2017-06-13 NOTE — Progress Notes (Signed)
ED Antimicrobial Stewardship Positive Culture Follow Up   Andrew Zhang is an 83 y.o. male who presented to [facility] on 06/09/2017 with a chief complaint of  Chief Complaint  Patient presents with  . Weakness    Recent Results (from the past 720 hour(s))  Urine culture     Status: Abnormal   Collection Time: 06/09/17  9:30 AM  Result Value Ref Range Status   Specimen Description URINE, RANDOM  Final   Special Requests NONE  Final   Culture >=100,000 COLONIES/mL ENTEROCOCCUS FAECALIS (A)  Final   Report Status 06/12/2017 FINAL  Final   Organism ID, Bacteria ENTEROCOCCUS FAECALIS (A)  Final      Susceptibility   Enterococcus faecalis - MIC*    AMPICILLIN <=2 SENSITIVE Sensitive     LEVOFLOXACIN 1 SENSITIVE Sensitive     NITROFURANTOIN 32 SENSITIVE Sensitive     VANCOMYCIN 2 SENSITIVE Sensitive     * >=100,000 COLONIES/mL ENTEROCOCCUS FAECALIS    [x]  Treated with Doxycycline, organism not covered by prescribed antimicrobial  New antibiotic prescription: D/c Doxycycline and Start Levaquin 250 mg daily x 5 days  ED Provider: Rolm Baptise 06/13/2017, 9:52 AM Infectious Diseases Pharmacist Phone# [phone]

## 2017-06-25 ENCOUNTER — Encounter: Payer: Self-pay | Admitting: Gastroenterology

## 2017-06-25 ENCOUNTER — Ambulatory Visit (INDEPENDENT_AMBULATORY_CARE_PROVIDER_SITE_OTHER): Payer: Medicare Other | Admitting: Gastroenterology

## 2017-06-25 ENCOUNTER — Ambulatory Visit (INDEPENDENT_AMBULATORY_CARE_PROVIDER_SITE_OTHER): Payer: Medicare Other | Admitting: Urology

## 2017-06-25 VITALS — BP 113/61 | HR 81 | Temp 98.3°F | Ht 74.0 in | Wt 197.8 lb

## 2017-06-25 DIAGNOSIS — N131 Hydronephrosis with ureteral stricture, not elsewhere classified: Secondary | ICD-10-CM

## 2017-06-25 DIAGNOSIS — K921 Melena: Secondary | ICD-10-CM

## 2017-06-25 DIAGNOSIS — N3000 Acute cystitis without hematuria: Secondary | ICD-10-CM | POA: Diagnosis not present

## 2017-06-25 DIAGNOSIS — I25768 Atherosclerosis of bypass graft of coronary artery of transplanted heart with other forms of angina pectoris: Secondary | ICD-10-CM | POA: Diagnosis not present

## 2017-06-25 NOTE — Progress Notes (Addendum)
REVIEWED. PT HAS HAD A COMPLETE GI WORKUP FOR TRANSFUSION DEPENDENT ANEMIA. PT SHOULD STOP USING ASA. RECHECK CBC IN 2 WEEKS. REPEAT EGD WITH GIVENS CAPSULE IF CONTINUES WITH BLACK TARRY STOOL/TRANSFUSION DEPENDENT ANEMIA AFTER 2 WEEKS OF HOLDING ASA.  Referring Provider: [name], MD Primary Care Physician:  Patient, No Pcp Per Primary GI: Dr. Oneida Alar   Chief Complaint  Patient presents with  . Rectal Bleeding    HPI:   Andrew Zhang is a 83 y.o. male presenting today with a history of IDA, transfusion-dependent anemia, EGD, colonoscopy, and capsule study all completed. EGD without evidence of bleeding stigmata, small bowel capsule study with possible few erosions in setting of NSAIDs. 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   States about a month or so of black tarry stool daily. No abdominal pain. Feels a little "give out" at times. Doesn't have the best appetite. Denies any iron intake. No pepto bismol. Last iron infusion was March 2018. Due for labs on Friday. Feels somewhat fatigued but denies shortness of breath, abdominal pain, dizziness. No constipation. States every stool is black and tarry. Declining getting labs today, stating he wants to wait until scheduled labs 2 days from now.   Past Medical History:  Diagnosis Date  . AAA (abdominal aortic aneurysm) ([place])   . Anemia   . Arthritis   . B12 deficiency   . CAD (coronary artery disease)   . Cancer [facility])    radiation for prostate cancer and lupron.  . Complication of anesthesia    sts,"after hearat surgery my stomach didnt wake up like it was supossed to".  . Depression   . Diverticulitis   . DMII (diabetes mellitus, type 2) ([place]) 2008  . GERD (gastroesophageal reflux disease)   . Hiatal hernia   . History of kidney stones   . HTN (hypertension)   . Hyperlipidemia   . PONV (postoperative nausea and vomiting)   . Prostate cancer ([place])   . Sleep apnea    cannot tolerate, PCP aware    Past Surgical History:  Procedure Laterality Date  . APPENDECTOMY    . BACK SURGERY     x2  . CATARACT EXTRACTION Left   . CHOLECYSTECTOMY    . COLONOSCOPY  03/2015   Dr. Britta Mccreedy: Diverticulosis, single tubular adenoma removed.  . COLONOSCOPY WITH PROPOFOL N/A 02/25/2017   Dr. Oneida Alar: non-thrombosed external hemorrhoids, significant looping of left colon. severe diverticulosis in recto-sigmoid colon and sigmoid colon. 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 CORONARY ARTERY BYPASS GRAFT    . CYSTOSCOPY     with stent-left kidney. x2  . CYSTOSCOPY W/ URETERAL STENT PLACEMENT Left 05/14/2017   Procedure: CYSTOSCOPY WITH RETROGRADE PYELOGRAM/URETERAL STENT EXCHANGE;  Surgeon: Cleon Gustin, MD;  Location: [facility] ORS;  Service: Urology;  Laterality: Left;  . DESCENDING AORTIC ANEURYSM REPAIR W/ STENT     x2  . ESOPHAGOGASTRODUODENOSCOPY N/A 07/30/2016   Dr. Oneida Alar. Nonobstructing Schatzki ring at GE junction, multiple small sessile polyps with no bleeding or stigmata of recent bleeding in the gastric fundus and gastric body. Benign-appearing intrinsic moderate stenosis found the pylorus status post dilation. Small bowel capsule deployed.  Marland Kitchen GIVENS CAPSULE STUDY     Capsule study is complete to the cecum. No obvious lesions, mass, tumors. Small wisps of blood noted in small bowel secondary to EGD/dilation. Possible few erosions in setting of NSAIDs. No obvious  areas of bleeding.    Current Outpatient Prescriptions  Medication Sig Dispense Refill  . aspirin 81 MG tablet Chew 81 mg by mouth daily.    . Cyanocobalamin 1000 MCG/ML LIQD Place 1 mL under the tongue daily.     . finasteride (PROSCAR) 5 MG tablet Take 5 mg by mouth daily.    Marland Kitchen glimepiride (AMARYL) 2 MG tablet Take 2 mg by mouth daily with breakfast.     . nitroGLYCERIN (NITROSTAT) 0.4 MG SL tablet  Place 1 tablet (0.4 mg total) under the tongue every 5 (five) minutes as needed for chest pain. 25 tablet 3  . omeprazole (PRILOSEC) 20 MG capsule Take 1 capsule (20 mg total) by mouth 2 (two) times daily before a meal. 60 capsule 0  . sertraline (ZOLOFT) 100 MG tablet Take 50 mg by mouth daily.    . simvastatin (ZOCOR) 80 MG tablet Take 80 mg by mouth at bedtime.    . tamsulosin (FLOMAX) 0.4 MG CAPS Take 0.4 mg by mouth daily after supper.    . traMADol (ULTRAM) 50 MG tablet Take 1 tablet (50 mg total) by mouth every 6 (six) hours as needed. 30 tablet 0   No current facility-administered medications for this visit.     Allergies as of 06/25/2017 - Review Complete 06/25/2017  Allergen Reaction Noted  . Penicillins Rash 08/27/2013    Family History  Problem Relation Age of Onset  . Heart disease Unknown   . Diabetes Unknown   . Hypertension Unknown   . Liver cancer Mother   . Colon cancer Neg Hx     Social History   Social History  . Marital status: Married    Spouse name: N/A  . Number of children: N/A  . Years of education: N/A   Social History Main Topics  . Smoking status: Former Smoker    Packs/day: 3.00    Years: 45.00    Types: Cigarettes    Start date: 04/19/1951    Quit date: 03/01/1994  . Smokeless tobacco: Never Used  . Alcohol use 0.0 oz/week     Comment: 1-2 beers daily   . Drug use: No  . Sexual activity: Yes    Birth control/ protection: None     Comment: married 59 years - 5 children    Other Topics Concern  . None   Social History Narrative  . None    Review of Systems: As mentioned in HPI   Physical Exam: BP 113/61   Pulse 81   Temp 98.3 F (36.8 C) (Oral)   Ht 6\' 2"  (1.88 m)   Wt 197 lb 12.8 oz (89.7 kg)   BMI 25.40 kg/m  General:   Alert and oriented. No distress noted. Pleasant and cooperative.  Head:  Normocephalic and atraumatic. Eyes:  Conjuctiva clear without scleral icterus. Mouth:  Oral mucosa pink and moist.  Abdomen:  +BS,  soft, non-tender and non-distended. No rebound or guarding. Umbilical hernia noted.  Msk:  Symmetrical without gross deformities. Normal posture. Extremities:  Without edema. Neurologic:  Alert and  oriented x4;  grossly normal neurologically. Psych:  Alert and cooperative. Normal mood and affect.

## 2017-06-25 NOTE — Patient Instructions (Signed)
We do not have the things to check your stool for blood. Let's check the labs on Friday. In the meantime, I will talk to Dr. Oneida Alar.

## 2017-06-27 ENCOUNTER — Encounter (HOSPITAL_COMMUNITY): Payer: Medicare Other

## 2017-06-27 ENCOUNTER — Encounter (HOSPITAL_COMMUNITY): Payer: Self-pay | Admitting: Emergency Medicine

## 2017-06-27 ENCOUNTER — Encounter (HOSPITAL_COMMUNITY): Payer: Medicare Other | Attending: Adult Health | Admitting: Adult Health

## 2017-06-27 ENCOUNTER — Emergency Department (HOSPITAL_COMMUNITY)
Admission: EM | Admit: 2017-06-27 | Discharge: 2017-06-27 | Disposition: A | Discharge status: Home / Self Care | Payer: Medicare Other | Attending: Emergency Medicine | Admitting: Emergency Medicine

## 2017-06-27 VITALS — BP 157/66 | HR 84 | Resp 16 | Ht 74.0 in | Wt 199.0 lb

## 2017-06-27 DIAGNOSIS — E785 Hyperlipidemia, unspecified: Secondary | ICD-10-CM | POA: Diagnosis not present

## 2017-06-27 DIAGNOSIS — G4730 Sleep apnea, unspecified: Secondary | ICD-10-CM | POA: Insufficient documentation

## 2017-06-27 DIAGNOSIS — Z87442 Personal history of urinary calculi: Secondary | ICD-10-CM | POA: Diagnosis not present

## 2017-06-27 DIAGNOSIS — Z7984 Long term (current) use of oral hypoglycemic drugs: Secondary | ICD-10-CM | POA: Insufficient documentation

## 2017-06-27 DIAGNOSIS — D500 Iron deficiency anemia secondary to blood loss (chronic): Secondary | ICD-10-CM | POA: Diagnosis not present

## 2017-06-27 DIAGNOSIS — I714 Abdominal aortic aneurysm, without rupture: Secondary | ICD-10-CM | POA: Insufficient documentation

## 2017-06-27 DIAGNOSIS — E119 Type 2 diabetes mellitus without complications: Secondary | ICD-10-CM | POA: Diagnosis not present

## 2017-06-27 DIAGNOSIS — D649 Anemia, unspecified: Secondary | ICD-10-CM | POA: Insufficient documentation

## 2017-06-27 DIAGNOSIS — E538 Deficiency of other specified B group vitamins: Secondary | ICD-10-CM | POA: Diagnosis not present

## 2017-06-27 DIAGNOSIS — Z8546 Personal history of malignant neoplasm of prostate: Secondary | ICD-10-CM | POA: Insufficient documentation

## 2017-06-27 DIAGNOSIS — Z7982 Long term (current) use of aspirin: Secondary | ICD-10-CM | POA: Diagnosis not present

## 2017-06-27 DIAGNOSIS — Z87891 Personal history of nicotine dependence: Secondary | ICD-10-CM | POA: Diagnosis not present

## 2017-06-27 DIAGNOSIS — I10 Essential (primary) hypertension: Secondary | ICD-10-CM | POA: Insufficient documentation

## 2017-06-27 DIAGNOSIS — K219 Gastro-esophageal reflux disease without esophagitis: Secondary | ICD-10-CM | POA: Diagnosis not present

## 2017-06-27 DIAGNOSIS — F329 Major depressive disorder, single episode, unspecified: Secondary | ICD-10-CM | POA: Diagnosis not present

## 2017-06-27 DIAGNOSIS — I2510 Atherosclerotic heart disease of native coronary artery without angina pectoris: Secondary | ICD-10-CM | POA: Diagnosis not present

## 2017-06-27 DIAGNOSIS — K5730 Diverticulosis of large intestine without perforation or abscess without bleeding: Secondary | ICD-10-CM | POA: Diagnosis not present

## 2017-06-27 DIAGNOSIS — Z79899 Other long term (current) drug therapy: Secondary | ICD-10-CM | POA: Diagnosis not present

## 2017-06-27 DIAGNOSIS — N189 Chronic kidney disease, unspecified: Secondary | ICD-10-CM

## 2017-06-27 DIAGNOSIS — R5383 Other fatigue: Secondary | ICD-10-CM | POA: Diagnosis present

## 2017-06-27 DIAGNOSIS — Z951 Presence of aortocoronary bypass graft: Secondary | ICD-10-CM | POA: Insufficient documentation

## 2017-06-27 DIAGNOSIS — K449 Diaphragmatic hernia without obstruction or gangrene: Secondary | ICD-10-CM | POA: Insufficient documentation

## 2017-06-27 DIAGNOSIS — C61 Malignant neoplasm of prostate: Secondary | ICD-10-CM

## 2017-06-27 LAB — COMPREHENSIVE METABOLIC PANEL
ALT: 18 U/L (ref 17–63)
ANION GAP: 8 (ref 5–15)
AST: 22 U/L (ref 15–41)
Albumin: 3.1 g/dL — ABNORMAL LOW (ref 3.5–5.0)
Alkaline Phosphatase: 51 U/L (ref 38–126)
BUN: 33 mg/dL — ABNORMAL HIGH (ref 6–20)
CHLORIDE: 105 mmol/L (ref 101–111)
CO2: 26 mmol/L (ref 22–32)
CREATININE: 2.12 mg/dL — AB (ref 0.61–1.24)
Calcium: 9 mg/dL (ref 8.9–10.3)
GFR, EST AFRICAN AMERICAN: 32 mL/min — AB (ref >60)
GFR, EST NON AFRICAN AMERICAN: 28 mL/min — AB (ref >60)
Glucose, Bld: 231 mg/dL — ABNORMAL HIGH (ref 65–99)
POTASSIUM: 4.6 mmol/L (ref 3.5–5.1)
Sodium: 139 mmol/L (ref 135–145)
Total Bilirubin: 0.5 mg/dL (ref 0.3–1.2)
Total Protein: 6.7 g/dL (ref 6.5–8.1)

## 2017-06-27 LAB — CBC WITH DIFFERENTIAL/PLATELET
Basophils Absolute: 0 10*3/uL (ref 0.0–0.1)
Basophils Relative: 0 %
EOS PCT: 2 %
Eosinophils Absolute: 0.1 10*3/uL (ref 0.0–0.7)
HCT: 24.3 % — ABNORMAL LOW (ref 39.0–52.0)
Hemoglobin: 7.7 g/dL — ABNORMAL LOW (ref 13.0–17.0)
LYMPHS ABS: 1 10*3/uL (ref 0.7–4.0)
LYMPHS PCT: 19 %
MCH: 28.2 pg (ref 26.0–34.0)
MCHC: 31.7 g/dL (ref 30.0–36.0)
MCV: 89 fL (ref 78.0–100.0)
MONO ABS: 0.3 10*3/uL (ref 0.1–1.0)
Monocytes Relative: 6 %
Neutro Abs: 3.7 10*3/uL (ref 1.7–7.7)
Neutrophils Relative %: 73 %
PLATELETS: 157 10*3/uL (ref 150–400)
RBC: 2.73 MIL/uL — ABNORMAL LOW (ref 4.22–5.81)
RDW: 14.7 % (ref 11.5–15.5)
WBC: 5.1 10*3/uL (ref 4.0–10.5)

## 2017-06-27 LAB — IRON AND TIBC
IRON: 27 ug/dL — AB (ref 45–182)
Saturation Ratios: 8 % — ABNORMAL LOW (ref 17.9–39.5)
TIBC: 360 ug/dL (ref 250–450)
UIBC: 333 ug/dL

## 2017-06-27 LAB — FERRITIN: FERRITIN: 17 ng/mL — AB (ref 24–336)

## 2017-06-27 LAB — PREPARE RBC (CROSSMATCH)

## 2017-06-27 MED ORDER — SODIUM CHLORIDE 0.9% FLUSH: 10.0000 mL | INTRAVENOUS | Status: DC | PRN

## 2017-06-27 MED ORDER — HEPARIN SOD (PORK) LOCK FLUSH 100 UNIT/ML IV SOLN: 500.0000 [IU] | Freq: Every day | INTRAVENOUS | Status: DC | PRN

## 2017-06-27 MED ORDER — ACETAMINOPHEN 325 MG PO TABS
650.0000 mg | ORAL_TABLET | Freq: Once | ORAL | Status: AC
  Administered 2017-06-27: 650 mg via ORAL
  Filled 2017-06-27: qty 2

## 2017-06-27 MED ORDER — DIPHENHYDRAMINE HCL 25 MG PO CAPS
25.0000 mg | ORAL_CAPSULE | Freq: Once | ORAL | Status: AC
  Administered 2017-06-27: 25 mg via ORAL
  Filled 2017-06-27: qty 1

## 2017-06-27 MED ORDER — SODIUM CHLORIDE 0.9 % IV SOLN
250.0000 mL | Freq: Once | INTRAVENOUS | Status: AC
  Administered 2017-06-27: 250 mL via INTRAVENOUS

## 2017-06-27 MED ORDER — SODIUM CHLORIDE 0.9% FLUSH: 3.0000 mL | INTRAVENOUS | Status: DC | PRN

## 2017-06-27 MED ORDER — HEPARIN SOD (PORK) LOCK FLUSH 100 UNIT/ML IV SOLN: 250.0000 [IU] | INTRAVENOUS | Status: DC | PRN

## 2017-06-27 NOTE — Patient Instructions (Addendum)
[facility] Discharge Instructions  RECOMMENDATIONS MADE BY THE CONSULTANT AND ANY TEST RESULTS WILL BE SENT TO YOUR REFERRING PHYSICIAN.  You were seen today by Gretchen Dawson NP.   Thank you for choosing [facility] at [facility] to provide your oncology and hematology care.  To afford each patient quality time with our provider, please arrive at least 15 minutes before your scheduled appointment time.    If you have a lab appointment with the Cancer Center please come in thru the  Main Entrance and check in at the main information desk  You need to re-schedule your appointment should you arrive 10 or more minutes late.  We strive to give you quality time with our providers, and arriving late affects you and other patients whose appointments are after yours.  Also, if you no show three or more times for appointments you may be dismissed from the clinic at the providers discretion.     Again, thank you for choosing [facility].  Our hope is that these requests will decrease the amount of time that you wait before being seen by our physicians.       _____________________________________________________________  Should you have questions after your visit to [facility], please contact our office at [phone] between the hours of 8:30 a.m. and 4:30 p.m.  Voicemails left after 4:30 p.m. will not be returned until the following business day.  For prescription refill requests, have your pharmacy contact our office.       Resources For Cancer Patients and their Caregivers ? American Cancer Society: Can assist with transportation, wigs, general needs, runs Look Good Feel Better.        1-888-227-6333 ? Cancer Care: Provides financial assistance, online support groups, medication/co-pay assistance.  1-800-813-HOPE (4673) ? [facility] Assists [place] cancer patients and  their families through emotional , educational and financial support.  336-427-4357 ? [place] DSS Where to apply for food stamps, Medicaid and utility assistance. 336-342-1394 ? [facility]: Transportation to medical appointments. 336-347-2287 ? Social Security Administration: May apply for disability if have a Stage IV cancer. 336-342-7796 1-800-772-1213 ? [place] Aging, Disability and Transit Services: Assists with nutrition, care and transit needs. 336-349-2343  Cancer Center Support Programs: @10RELATIVEDAYS@ > Cancer Support Group  2nd Tuesday of the month 1pm-2pm, Journey Room  > Creative Journey  3rd Tuesday of the month 1130am-1pm, Journey Room  > Look Good Feel Better  1st Wednesday of the month 10am-12 noon, Journey Room (Call American Cancer Society to register 1-800-395-5775)    

## 2017-06-27 NOTE — Discharge Instructions (Signed)
Follow-up with your doctor next week as planned 

## 2017-06-27 NOTE — ED Triage Notes (Signed)
Patient sent from cancer center for transfusion of RBCs due to low hemoglobin. Patient has history of prostate cancer.

## 2017-06-27 NOTE — ED Provider Notes (Signed)
[facility] DEPT Provider Note   CSN: [id number] Arrival date & time: 06/27/17  1500     History   Chief Complaint Chief Complaint  Patient presents with  . Blood Transfusion    HPI ALBERTUS CHIARELLI is a 83 y.o. male.  Patient states he was seen at the oncologist office today. His hemoglobin was 7.7. He has felt really tired. I spoke with the nurse practitioner at the oncologist office and it was decided to send the patient to the emergency department to get 1 unit of blood. He will then be discharged home and follow-up with them next week   The history is provided by the patient. No language interpreter was used.  Illness  This is a recurrent problem. The current episode started more than 2 days ago. The problem occurs constantly. Pertinent negatives include no chest pain, no abdominal pain and no headaches. Nothing aggravates the symptoms. Nothing relieves the symptoms.    Past Medical History:  Diagnosis Date  . AAA (abdominal aortic aneurysm) ([place])   . Anemia   . Arthritis   . B12 deficiency   . CAD (coronary artery disease)   . Cancer [facility])    radiation for prostate cancer and lupron.  . Complication of anesthesia    sts,"after hearat surgery my stomach didnt wake up like it was supossed to".  . Depression   . Diverticulitis   . DMII (diabetes mellitus, type 2) ([place]) 2008  . GERD (gastroesophageal reflux disease)   . Hiatal hernia   . History of kidney stones   . HTN (hypertension)   . Hyperlipidemia   . PONV (postoperative nausea and vomiting)   . Prostate cancer ([place])   . Sleep apnea    cannot tolerate, PCP aware    Patient Active Problem List   Diagnosis Date Noted  . Acute blood loss anemia   . Iron deficiency anemia due to chronic blood loss 01/08/2017  . GIB (gastrointestinal bleeding) 09/18/2016  . Melena   . Acute on chronic renal failure ([place]) 07/29/2016  . Anemia 07/29/2016  . Adenocarcinoma of prostate ([place]) 09/06/2014  . Type 2 diabetes  mellitus without complications ([place]) 37/34/2876  . BPH (benign prostatic hyperplasia) 12/14/2013  . Malignant neoplasm of prostate ([place]) 12/14/2013  . CAD (coronary artery disease) of artery bypass graft 08/27/2013  . HTN (hypertension) 08/27/2013  . Hyperlipidemia 08/27/2013  . S/P CABG x 3 08/27/2013  . Abdominal aortic aneurysm ([place]) 04/22/2013    Past Surgical History:  Procedure Laterality Date  . APPENDECTOMY    . BACK SURGERY     x2  . CATARACT EXTRACTION Left   . CHOLECYSTECTOMY    . COLONOSCOPY  03/2015   Dr. Britta Mccreedy: Diverticulosis, single tubular adenoma removed.  . COLONOSCOPY WITH PROPOFOL N/A 02/25/2017   Procedure: COLONOSCOPY WITH PROPOFOL;  Surgeon: Danie Binder, MD;  Location: [facility] END[facility] SUITE;  Service: Endoscopy;  Laterality: N/A;  9:30am  . CORONARY ARTERY BYPASS GRAFT    . CYSTOSCOPY     with stent-left kidney. x2  . CYSTOSCOPY W/ URETERAL STENT PLACEMENT Left 05/14/2017   Procedure: CYSTOSCOPY WITH RETROGRADE PYELOGRAM/URETERAL STENT EXCHANGE;  Surgeon: Cleon Gustin, MD;  Location: [facility] ORS;  Service: Urology;  Laterality: Left;  . DESCENDING AORTIC ANEURYSM REPAIR W/ STENT     x2  . ESOPHAGOGASTRODUODENOSCOPY N/A 07/30/2016   Dr. Oneida Alar. Nonobstructing Schatzki ring at GE junction, multiple small sessile polyps with no bleeding or stigmata of recent bleeding in the gastric fundus  and gastric body. Benign-appearing intrinsic moderate stenosis found the pylorus status post dilation. Small bowel capsule deployed.  Marland Kitchen GIVENS CAPSULE STUDY     Capsule study is complete to the cecum. No obvious lesions, mass, tumors. Small wisps of blood noted in small bowel secondary to EGD/dilation. Possible few erosions in setting of NSAIDs. No obvious areas of bleeding.       Home Medications    Prior to Admission medications   Medication Sig Start Date End Date Taking? Authorizing Provider  aspirin 81 MG tablet Chew 81 mg by mouth daily.    [provider]    Cyanocobalamin 1000 MCG/ML LIQD Place 1 mL under the tongue daily.     [provider]  finasteride (PROSCAR) 5 MG tablet Take 5 mg by mouth daily.    [provider]  glimepiride (AMARYL) 2 MG tablet Take 2 mg by mouth daily with breakfast.     [provider]  nitroGLYCERIN (NITROSTAT) 0.4 MG SL tablet Place 1 tablet (0.4 mg total) under the tongue every 5 (five) minutes as needed for chest pain. 04/05/16   Herminio Commons, MD  omeprazole (PRILOSEC) 20 MG capsule Take 1 capsule (20 mg total) by mouth 2 (two) times daily before a meal. 07/31/16   Samuella Cota, MD  sertraline (ZOLOFT) 100 MG tablet Take 50 mg by mouth daily.    [provider]  simvastatin (ZOCOR) 80 MG tablet Take 80 mg by mouth at bedtime.    [provider]  tamsulosin (FLOMAX) 0.4 MG CAPS Take 0.4 mg by mouth daily after supper.    [provider]  traMADol (ULTRAM) 50 MG tablet Take 1 tablet (50 mg total) by mouth every 6 (six) hours as needed. 05/14/17 05/14/18  Cleon Gustin, MD    Family History Family History  Problem Relation Age of Onset  . Heart disease Unknown   . Diabetes Unknown   . Hypertension Unknown   . Liver cancer Mother   . Colon cancer Neg Hx     Social History Social History  Substance Use Topics  . Smoking status: Former Smoker    Packs/day: 3.00    Years: 45.00    Types: Cigarettes    Start date: 04/19/1951    Quit date: 03/01/1994  . Smokeless tobacco: Never Used  . Alcohol use 0.0 oz/week     Comment: 1-2 beers daily      Allergies   Penicillins   Review of Systems Review of Systems  Constitutional: Positive for fatigue. Negative for appetite change.  HENT: Negative for congestion, ear discharge and sinus pressure.   Eyes: Negative for discharge.  Respiratory: Negative for cough.   Cardiovascular: Negative for chest pain.  Gastrointestinal: Negative for abdominal pain and diarrhea.  Genitourinary: Negative for  frequency and hematuria.  Musculoskeletal: Negative for back pain.  Skin: Negative for rash.  Neurological: Negative for seizures and headaches.  Psychiatric/Behavioral: Negative for hallucinations.     Physical Exam Updated Vital Signs BP 136/61   Pulse 81   Temp 98.5 F (36.9 C) (Oral)   Resp 20   Ht 6\' 2"  (1.88 m)   Wt 90.7 kg (200 lb)   SpO2 97%   BMI 25.68 kg/m   Physical Exam  Constitutional: He is oriented to person, place, and time. He appears well-developed.  HENT:  Head: Normocephalic.  Eyes: Conjunctivae are normal.  Neck: No tracheal deviation present.  Cardiovascular:  No murmur heard. Musculoskeletal: Normal range of  motion.  Neurological: He is oriented to person, place, and time.  Skin: Skin is warm.  Psychiatric: He has a normal mood and affect.     ED Treatments / Results  Labs (all labs ordered are listed, but only abnormal results are displayed) Labs Reviewed  PREPARE RBC (CROSSMATCH)    EKG  EKG Interpretation None       Radiology No results found.  Procedures Procedures (including critical care time)  Medications Ordered in ED Medications  sodium chloride flush (NS) 0.9 % injection 3 mL (not administered)  heparin lock flush 100 unit/mL (not administered)  heparin lock flush 100 unit/mL (not administered)  sodium chloride flush (NS) 0.9 % injection 10 mL (not administered)  0.9 %  sodium chloride infusion (250 mLs Intravenous New Bag/Given 06/27/17 1649)  acetaminophen (TYLENOL) tablet 650 mg (650 mg Oral Given 06/27/17 1648)  diphenhydrAMINE (BENADRYL) capsule 25 mg (25 mg Oral Given 06/27/17 1648)     Initial Impression / Assessment and Plan / ED Course  I have reviewed the triage vital signs and the nursing notes.  Pertinent labs & imaging results that were available during my care of the patient were reviewed by me and considered in my medical decision making (see chart for details).     Patient received his 1 unit of  blood. He stated that he felt better. He is not as fatigued. He'll be sent home and follow-up with oncology next week  Final Clinical Impressions(s) / ED Diagnoses   Final diagnoses:  Symptomatic anemia  Iron deficiency anemia due to chronic blood loss    New Prescriptions New Prescriptions   No medications on file     Milton Ferguson, MD 06/27/17 1928

## 2017-06-27 NOTE — Progress Notes (Signed)
[place], [place] [address]   CLINIC:  Medical Oncology/Hematology  PCP:  Patient, No Pcp Per No address on file None   REASON FOR VISIT:  Follow-up for Anemia & history of prostate cancer (managed by urology)  CURRENT THERAPY: IV iron and blood transfusions as needed    HISTORY OF PRESENT ILLNESS:  (From Dr. Laverle Patter last note on 01/30/17)      INTERVAL HISTORY:  Andrew Zhang 83 y.o. male returns for follow-up for multifactorial anemia.   He is here today with his wife. Reports that for the past few days he has felt very tired and weak. Energy levels are 50%. Appetite also 50%.  Endorses dark black/tarry stools x 1 month; generally has bowel movement about once daily.  He was actually seen by GI yesterday for follow-up as well.  He does not take oral iron.    He tells me that he had an episode of chest pain last night when he laid down. It resolved, but states "this happened the last time I needed blood."  Also endorses periodic dizziness in recent days/weeks as well.   He has hip and back pain, which is chronic. Also has tingling in his feet and legs at night, which has been chronic as well.   IV iron administration record (2018):  Oncology Flowsheet 03/17/2017 03/24/2017  ferumoxytol (FERAHEME) IV 510 mg 510 mg    REVIEW OF SYSTEMS:  Review of Systems  Constitutional: Positive for fatigue.  HENT:  Negative.   Eyes: Negative.   Respiratory: Positive for cough (chronic cough). Negative for shortness of breath.   Cardiovascular: Positive for chest pain.  Gastrointestinal: Negative for abdominal pain, constipation, diarrhea, nausea and vomiting.       Black tarry stools  Endocrine: Negative.   Genitourinary: Negative.    Musculoskeletal: Positive for arthralgias and back pain.  Skin: Negative.  Negative for rash.  Neurological: Positive for dizziness, extremity weakness and numbness.  Hematological: Negative.  Does not bruise/bleed  easily.  Psychiatric/Behavioral: Negative.      PAST MEDICAL/SURGICAL HISTORY:  Past Medical History:  Diagnosis Date  . AAA (abdominal aortic aneurysm) ([place])   . Anemia   . Arthritis   . B12 deficiency   . CAD (coronary artery disease)   . Cancer [facility])    radiation for prostate cancer and lupron.  . Complication of anesthesia    sts,"after hearat surgery my stomach didnt wake up like it was supossed to".  . Depression   . Diverticulitis   . DMII (diabetes mellitus, type 2) ([place]) 2008  . GERD (gastroesophageal reflux disease)   . Hiatal hernia   . History of kidney stones   . HTN (hypertension)   . Hyperlipidemia   . PONV (postoperative nausea and vomiting)   . Prostate cancer ([place])   . Sleep apnea    cannot tolerate, PCP aware   Past Surgical History:  Procedure Laterality Date  . APPENDECTOMY    . BACK SURGERY     x2  . CATARACT EXTRACTION Left   . CHOLECYSTECTOMY    . COLONOSCOPY  03/2015   Dr. Britta Mccreedy: Diverticulosis, single tubular adenoma removed.  . COLONOSCOPY WITH PROPOFOL N/A 02/25/2017   Procedure: COLONOSCOPY WITH PROPOFOL;  Surgeon: Danie Binder, MD;  Location: [facility] ENDO SUITE;  Service: Endoscopy;  Laterality: N/A;  9:30am  . CORONARY ARTERY BYPASS GRAFT    . CYSTOSCOPY     with stent-left kidney. x2  .  CYSTOSCOPY W/ URETERAL STENT PLACEMENT Left 05/14/2017   Procedure: CYSTOSCOPY WITH RETROGRADE PYELOGRAM/URETERAL STENT EXCHANGE;  Surgeon: Cleon Gustin, MD;  Location: [facility] ORS;  Service: Urology;  Laterality: Left;  . DESCENDING AORTIC ANEURYSM REPAIR W/ STENT     x2  . ESOPHAGOGASTRODUODENOSCOPY N/A 07/30/2016   Dr. Oneida Alar. Nonobstructing Schatzki ring at GE junction, multiple small sessile polyps with no bleeding or stigmata of recent bleeding in the gastric fundus and gastric body. Benign-appearing intrinsic moderate stenosis found the pylorus status post dilation. Small bowel capsule deployed.  Marland Kitchen GIVENS CAPSULE STUDY     Capsule study is complete  to the cecum. No obvious lesions, mass, tumors. Small wisps of blood noted in small bowel secondary to EGD/dilation. Possible few erosions in setting of NSAIDs. No obvious areas of bleeding.     SOCIAL HISTORY:  Social History   Social History  . Marital status: Married    Spouse name: N/A  . Number of children: N/A  . Years of education: N/A   Occupational History  . Not on file.   Social History Main Topics  . Smoking status: Former Smoker    Packs/day: 3.00    Years: 45.00    Types: Cigarettes    Start date: 04/19/1951    Quit date: 03/01/1994  . Smokeless tobacco: Never Used  . Alcohol use 0.0 oz/week     Comment: 1-2 beers daily   . Drug use: No  . Sexual activity: Yes    Birth control/ protection: None     Comment: married 59 years - 5 children    Other Topics Concern  . Not on file   Social History Narrative  . No narrative on file    FAMILY HISTORY:  Family History  Problem Relation Age of Onset  . Heart disease Unknown   . Diabetes Unknown   . Hypertension Unknown   . Liver cancer Mother   . Colon cancer Neg Hx     CURRENT MEDICATIONS:  Outpatient Encounter Prescriptions as of 06/27/2017  Medication Sig  . aspirin 81 MG tablet Chew 81 mg by mouth daily.  . Cyanocobalamin 1000 MCG/ML LIQD Place 1 mL under the tongue daily.   . finasteride (PROSCAR) 5 MG tablet Take 5 mg by mouth daily.  Marland Kitchen glimepiride (AMARYL) 2 MG tablet Take 2 mg by mouth daily with breakfast.   . nitroGLYCERIN (NITROSTAT) 0.4 MG SL tablet Place 1 tablet (0.4 mg total) under the tongue every 5 (five) minutes as needed for chest pain.  Marland Kitchen omeprazole (PRILOSEC) 20 MG capsule Take 1 capsule (20 mg total) by mouth 2 (two) times daily before a meal.  . sertraline (ZOLOFT) 100 MG tablet Take 50 mg by mouth daily.  . simvastatin (ZOCOR) 80 MG tablet Take 80 mg by mouth at bedtime.  . tamsulosin (FLOMAX) 0.4 MG CAPS Take 0.4 mg by mouth daily after supper.  . traMADol (ULTRAM) 50 MG tablet Take  1 tablet (50 mg total) by mouth every 6 (six) hours as needed.  . [DISCONTINUED] Calcium Carbonate-Vitamin D (CALTRATE 600+D) 600-400 MG-UNIT per tablet Take 1 tablet by mouth daily.  . [DISCONTINUED] doxycycline (VIBRAMYCIN) 100 MG capsule Take 1 capsule (100 mg total) by mouth 2 (two) times daily. (Patient not taking: Reported on 06/25/2017)   No facility-administered encounter medications on file as of 06/27/2017.     ALLERGIES:  Allergies  Allergen Reactions  . Penicillins Rash    Has patient had a PCN reaction causing immediate rash, facial/tongue/throat  swelling, SOB or lightheadedness with hypotension: Unknown Has patient had a PCN reaction causing severe rash involving mucus membranes or skin necrosis: Unknown Has patient had a PCN reaction that required hospitalization: No Has patient had a PCN reaction occurring within the last 10 years: No If all of the above answers are "NO", then may proceed with Cephalosporin use.     PHYSICAL EXAM:  ECOG Performance status: 1-2 - Symptomatic; requires occasional assistance.   Vitals:   06/27/17 1340  BP: (!) 157/66  Pulse: 84  Resp: 16   Filed Weights   06/27/17 1340  Weight: 199 lb (90.3 kg)    Physical Exam  Constitutional: He is oriented to person, place, and time.  Male in no acute distress   HENT:  Head: Normocephalic.  Mouth/Throat: Oropharynx is clear and moist. No oropharyngeal exudate.  Eyes: Conjunctivae are normal. Pupils are equal, round, and reactive to light. No scleral icterus.  Neck: Normal range of motion. Neck supple.  Cardiovascular:  No murmur heard. Irregularly regular   Pulmonary/Chest: Effort normal and breath sounds normal. No respiratory distress. He has no wheezes.  Abdominal: Soft. Bowel sounds are normal. There is no tenderness. There is no rebound and no guarding.  Musculoskeletal: Normal range of motion. He exhibits no edema.  Lymphadenopathy:    He has no cervical adenopathy.  Neurological:  He is alert and oriented to person, place, and time. No cranial nerve deficit.  Skin: Skin is warm and dry. There is pallor.  Psychiatric: Mood, memory, affect and judgment normal.  Nursing note and vitals reviewed.    LABORATORY DATA:  I have reviewed the labs as listed.  CBC    Component Value Date/Time   WBC 5.1 06/27/2017 1242   RBC 2.73 (L) 06/27/2017 1242   HGB 7.7 (L) 06/27/2017 1242   HCT 24.3 (L) 06/27/2017 1242   HCT 30.2 (L) 08/02/2016 1439   PLT 157 06/27/2017 1242   MCV 89.0 06/27/2017 1242   MCH 28.2 06/27/2017 1242   MCHC 31.7 06/27/2017 1242   RDW 14.7 06/27/2017 1242   LYMPHSABS 1.0 06/27/2017 1242   MONOABS 0.3 06/27/2017 1242   EOSABS 0.1 06/27/2017 1242   BASOSABS 0.0 06/27/2017 1242   CMP Latest Ref Rng & Units 06/27/2017 06/09/2017 05/12/2017  Glucose 65 - 99 mg/dL 231(H) 160(H) 122(H)  BUN 6 - 20 mg/dL 33(H) 31(H) 43(H)  Creatinine 0.61 - 1.24 mg/dL 2.12(H) 2.18(H) 2.13(H)  Sodium 135 - 145 mmol/L 139 137 138  Potassium 3.5 - 5.1 mmol/L 4.6 3.6 4.5  Chloride 101 - 111 mmol/L 105 102 107  CO2 22 - 32 mmol/L 26 24 23   Calcium 8.9 - 10.3 mg/dL 9.0 8.6(L) 9.5  Total Protein 6.5 - 8.1 g/dL 6.7 7.0 -  Total Bilirubin 0.3 - 1.2 mg/dL 0.5 1.1 -  Alkaline Phos 38 - 126 U/L 51 46 -  AST 15 - 41 U/L 22 31 -  ALT 17 - 63 U/L 18 29 -    PENDING LABS:    DIAGNOSTIC IMAGING:  *The following radiologic images and reports have been reviewed independently and agree with below findings.  Colonoscopy: 02/25/17       PATHOLOGY:     ASSESSMENT & PLAN:   Symptomatic anemia:  -Likely secondary to GI blood loss/radiation proctitis with patient's reported black stools.  GI is involved and saw patient yesterday.  Last colonoscopy was in 01/2017 with Dr. 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 -Reported weakness, fatigue, and dizziness.  Had 1 episode of chest pain last night (likely demand ischemia from anemia). He does have significant cardiac history.   -Will discuss with ED team here at [facility] to see if patient can be evaluated there and receive transfusion today for symptomatic anemia.  Patient also sent home with stool cards x 3 to confirm what we suspect to be hemoccult positive stool.  -Return to cancer center on Monday, 06/30/17 for follow-up and possible additional transfusion.  Will also re-check labs and collect CBC with diff, CMET, and send sample to blood bank.    *Discussed with ED Physician, Dr. Roderic Palau, and ED Charge RN via phone. Type and screen collected by our nursing staff prior to transport to ED.  We will transport patient down to ED for further evaluation and transfusion of at least 1 unit PRBCs. Orders placed for 1 unit PRBCs. If ED team feels patient may be suited for 23-hour observation or admission to the hospital, I defer to their medical judgment and support the clinical decision they deem necessary after their evaluation.    History of prostate cancer:  -Treated with brachytherapy radiation.  -Managed and followed by urology.       Dispo:  -Return to cancer center on Monday, 06/30/17 for follow-up with labs and possible additional transfusion; scheduling appts made for patient prior to transport to ED today.    All questions were answered to patient's stated satisfaction. Encouraged patient to call with any new concerns or questions before his next visit to the cancer center and we can certain see him sooner, if needed.    Plan of care discussed with Dr. Talbert Cage, who agrees with the above aforementioned.    Orders placed this encounter:  Orders Placed This Encounter  Procedures  . Type and screen      Andrew Craze, NP [place] [phone]

## 2017-06-28 ENCOUNTER — Encounter: Payer: Self-pay | Admitting: Gastroenterology

## 2017-06-28 NOTE — Assessment & Plan Note (Signed)
83 year old male with history of IDA, transfusion-dependent anemia, recent EGD/TCS/capsule all on file and felt that radiation proctitis was contributing to IDA. Status post APC therapy in Feb 2018. Denies bright red blood and noting black, tarry stool for approximately a month. Denying oral iron intake, pepto bismol. Due for labs on Friday with hematology, and I have requested that we have these done today. He is declining this and would rather wait until Friday, which is 2 days from now. Will obtain ifobt as well and follow-up on pending labs. Discussed if acute on chronic anemia, may need additional evaluation (?repeat capsule study). Further recommendations to follow after review of labs.

## 2017-06-29 ENCOUNTER — Encounter (HOSPITAL_COMMUNITY): Payer: Self-pay | Admitting: Oncology

## 2017-06-29 NOTE — Assessment & Plan Note (Addendum)
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  Labs today: CBC diff, CMET, UA, and stool cards.  I personally reviewed and went over laboratory results with the patient.  The results are noted within this dictation.  HGB is up to 8.6 g/dL after receiving 1 unit of PRBCs last week.  Stool cards are positive for blood.  I have messaged Roseanne Kaufman, NP (GI) about positive stool cards.  Will defer GI work-up to them.  Labs from 06/27/2017 reviewed.  I personally reviewed and went over laboratory results with the patient.  The results are noted within this dictation.  Labs demonstrate an iron deficiency.  Calculated iron deficit is 1225 mg.  As a result, Injectafer 750 mg supportive therapy plan is built and ordered.  Injectafer 750 mg today and again next week.  Nursing, in accordance with IV iron administration protocol, will monitor for acute side effects/toxicities associated with IV iron administration today.  He is scheduled for 2 units PRBCs today.  I will cancel 1 unit.  We will give 1 unit of PRBCs with IV iron today.  Nursing, in accordance with blood product administration protocol, will monitor for acute side effects/toxicities associated with blood product administration today.  Labs today: CBC diff, CMET, sample to blood bank.  UA added as well for recent history of hematuria.  UA demonstrates ongoing hematuria and leukocytes in urine.  Will send off urine for culture and sensitivity.    Based upon his UA ~ 2-3 weeks ago, UTI was sensitive to Levaquin.  Rx is printed for Levaquin 500 mg PO on day 1 and then 250 mg PO daily for a 7 day course of antibiotic, dose adjusted for renal function.  If culture confirms ongoing UTI, will refer the patient back to Dr.  Alyson Ingles for consideration of sooner appt than 07/16/2017.  Patient has ureter stents in place and these may be infected.  He also is scheduled for stent exchange in September 2018.    Labs in 1 week: CBC, sample to blood bank.  Labs in 4 weeks: CBC diff, BMET, iron/TIBC, ferritin, B12, folate, EPO level, retic count, and UA.  Return in 4 weeks for follow-up.

## 2017-06-29 NOTE — Progress Notes (Signed)
Patient, No Pcp Per No address on file  Iron deficiency anemia due to chronic blood loss - Plan: CBC, Sample to Blood Bank, CBC with Differential, Basic metabolic panel, Vitamin M42, Folate, Iron and TIBC, Ferritin, Erythropoietin, Reticulocytes, Sample to Blood Bank  Hematuria, unspecified type - Plan: Urinalysis, Routine w reflex microscopic, Urinalysis, Routine w reflex microscopic, Urine culture, levofloxacin (LEVAQUIN) 250 MG tablet  Leukocytes in urine - Plan: levofloxacin (LEVAQUIN) 250 MG tablet  CURRENT THERAPY: Vitamin support.  INTERVAL HISTORY: ITZEL LOWRIMORE 83 y.o. 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 AND Low B12, on PO B12 replacement therapy. AND Prostate carcinoma, on Depo-Lupron managed by urology.  HPI Elements   Location: Blood  Quality: Anemia  Severity: Severe, requiring PRBC  Duration: Since at least 01/2016  Context: Stage III chronic renal disease, iron deficiency, GI blood loss.  Timing:   Modifying Factors: Fatigue, tiredness, H/O chest pain.  Associated Signs & Symptoms:    Recent follow-up visit from 6/29 noted with Mike Craze, NP (Hem/onc).  Patient was escorted to ED for PRBC transfusion for HGB in 7 gram range.  Patient reported black stools during that visit suspicious for GI blood loss (acute).  He was also given stool cards.  He reports feeling minimally better since transfusion.  He denies any chest pain.  He does admit to black stools.    Review of Systems  Constitutional: Positive for malaise/fatigue. Negative for chills, fever and weight loss.  HENT: Negative.  Negative for nosebleeds.   Eyes: Negative.   Respiratory: Negative.  Negative for cough,  hemoptysis, sputum production and shortness of breath.   Cardiovascular: Negative.  Negative for chest pain, palpitations and orthopnea.  Gastrointestinal: Positive for melena. Negative for blood in stool, constipation, diarrhea, nausea and vomiting.  Genitourinary: Positive for hematuria. Negative for dysuria.  Musculoskeletal: Negative.   Skin: Negative.   Neurological: Positive for dizziness and weakness. Negative for focal weakness and headaches.  Endo/Heme/Allergies: Negative.   Psychiatric/Behavioral: Negative.     Past Medical History:  Diagnosis Date  . AAA (abdominal aortic aneurysm) ([place])   . Anemia   . Arthritis   . B12 deficiency   . CAD (coronary artery disease)   . Cancer [facility])    radiation for prostate cancer and lupron.  . Complication of anesthesia    sts,"after hearat surgery my stomach didnt wake up like it was supossed to".  . Depression   . Diverticulitis   . DMII (diabetes mellitus, type 2) ([place]) 2008  . GERD (gastroesophageal reflux disease)   . Hiatal hernia   . History of kidney stones   . HTN (hypertension)   . Hyperlipidemia   . Iron deficiency anemia due to chronic blood loss 01/08/2017  . PONV (postoperative nausea and vomiting)   . Prostate cancer ([place])   . Sleep apnea    cannot tolerate, PCP aware    Past Surgical History:  Procedure Laterality Date  . APPENDECTOMY    . BACK SURGERY     x2  . CATARACT EXTRACTION Left   . CHOLECYSTECTOMY    . COLONOSCOPY  03/2015   Dr. Britta Mccreedy: Diverticulosis, single tubular adenoma removed.  . COLONOSCOPY WITH PROPOFOL N/A 02/25/2017   Dr. Oneida Alar: non-thrombosed  external hemorrhoids, significant looping of left colon. severe diverticulosis in recto-sigmoid colon and sigmoid colon. 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 CORONARY ARTERY BYPASS GRAFT    . CYSTOSCOPY     with stent-left kidney. x2  .  CYSTOSCOPY W/ URETERAL STENT PLACEMENT Left 05/14/2017   Procedure: CYSTOSCOPY WITH RETROGRADE PYELOGRAM/URETERAL STENT EXCHANGE;  Surgeon: Cleon Gustin, MD;  Location: [facility];  Service: Urology;  Laterality: Left;  . DESCENDING AORTIC ANEURYSM REPAIR W/ STENT     x2  . ESOPHAGOGASTRODUODENOSCOPY N/A 07/30/2016   Dr. Oneida Alar. Nonobstructing Schatzki ring at GE junction, multiple small sessile polyps with no bleeding or stigmata of recent bleeding in the gastric fundus and gastric body. Benign-appearing intrinsic moderate stenosis found the pylorus status post dilation. Small bowel capsule deployed.  Marland Kitchen GIVENS CAPSULE STUDY     Capsule study is complete to the cecum. No obvious lesions, mass, tumors. Small wisps of blood noted in small bowel secondary to EGD/dilation. Possible few erosions in setting of NSAIDs. No obvious areas of bleeding.    Family History  Problem Relation Age of Onset  . Heart disease Unknown   . Diabetes Unknown   . Hypertension Unknown   . Liver cancer Mother   . Colon cancer Neg Hx     Social History   Social History  . Marital status: Married    Spouse name: N/A  . Number of children: N/A  . Years of education: N/A   Social History Main Topics  . Smoking status: Former Smoker    Packs/day: 3.00    Years: 45.00    Types: Cigarettes    Start date: 04/19/1951    Quit date: 03/01/1994  . Smokeless tobacco: Never Used  . Alcohol use 0.0 oz/week     Comment: 1-2 beers daily   . Drug use: No  . Sexual activity: Yes    Birth control/ protection: None     Comment: married 59 years - 5 children    Other Topics Concern  . None   Social History Narrative  . None     PHYSICAL EXAMINATION  ECOG PERFORMANCE STATUS: 1 - Symptomatic but completely ambulatory  Vitals:   06/30/17 0952  BP: 138/66  Pulse: 77  Resp: 16    GENERAL:alert, no distress, well nourished, well developed, comfortable, cooperative, smiling and accompanied by wife, Addy. SKIN:  skin color, texture, turgor are normal, no rashes or significant lesions HEAD: Normocephalic, No masses, lesions, tenderness or abnormalities EYES: normal, Conjunctiva are pink and non-injected EARS: External ears normal OROPHARYNX:lips, buccal mucosa, and tongue normal and mucous membranes are moist  NECK: supple, no adenopathy, trachea midline LYMPH:  no palpable lymphadenopathy BREAST:not examined LUNGS: clear to auscultation  HEART: regular rate & rhythm, no murmurs and no gallops ABDOMEN:abdomen soft, non-tender and normal bowel sounds BACK: Back symmetric, no curvature. EXTREMITIES:less then 2 second capillary refill, no joint deformities, effusion, or inflammation, no skin discoloration, no cyanosis  NEURO: alert & oriented x 3 with fluent speech, no focal motor/sensory deficits, gait normal   LABORATORY DATA: CBC    Component Value Date/Time   WBC 4.8 06/30/2017 0909   RBC 3.01 (L) 06/30/2017 0909   HGB 8.6 (L) 06/30/2017 0909   HCT 26.9 (L) 06/30/2017 0909   HCT 30.2 (L) 08/02/2016 1439   PLT 143 (L) 06/30/2017 0909   MCV 89.4 06/30/2017 0909   MCH 28.6 06/30/2017 0909   MCHC 32.0  06/30/2017 0909   RDW 14.7 06/30/2017 0909   LYMPHSABS 0.9 06/30/2017 0909   MONOABS 0.5 06/30/2017 0909   EOSABS 0.2 06/30/2017 0909   BASOSABS 0.0 06/30/2017 0909      Chemistry      Component Value Date/Time   NA 138 06/30/2017 0909   K 4.4 06/30/2017 0909   CL 103 06/30/2017 0909   CO2 27 06/30/2017 0909   BUN 32 (H) 06/30/2017 0909   CREATININE 1.93 (H) 06/30/2017 0909      Component Value Date/Time   CALCIUM 9.0 06/30/2017 0909   ALKPHOS 53 06/30/2017 0909   AST 21 06/30/2017 0909   ALT 18 06/30/2017 0909   BILITOT 0.7 06/30/2017 0909     Lab Results  Component Value Date   IRON 27 (L) 06/27/2017   TIBC 360 06/27/2017   FERRITIN 17 (L) 06/27/2017   Lab Results  Component Value Date   OCCULTBLD POSITIVE (A) 06/30/2017   OCCULTBLD POSITIVE (A) 06/29/2017    OCCULTBLD POSITIVE (A) 06/29/2017   Lab Results  Component Value Date   [id number] 425 03/27/2017   No results found for: FOLATE   PENDING LABS:   RADIOGRAPHIC STUDIES:  Dg Chest 2 View  Result Date: 06/09/2017 CLINICAL DATA:  Central chest pain. EXAM: CHEST  2 VIEW COMPARISON:  10/31/2016. FINDINGS: Trachea is midline. Heart size stable. Thoracic aorta is calcified. Lungs are hyperinflated. Biapical pleuroparenchymal scarring. Blunting of the costophrenic angles is likely chronic. IMPRESSION: Hyperinflation without acute finding. Electronically Signed   By: Lorin Picket M.D.   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  Labs today: CBC diff, CMET, UA, and stool cards.  I personally reviewed and went over laboratory results with the patient.  The results are noted within this dictation.  HGB is up to 8.6 g/dL after receiving 1 unit of PRBCs last week.  Stool cards are positive for blood.  I have messaged Roseanne Kaufman, NP (GI) about positive stool cards.  Will defer GI work-up to them.  Labs from 06/27/2017 reviewed.  I personally reviewed and went over laboratory results with the patient.  The results are noted within this dictation.  Labs demonstrate an iron deficiency.  Calculated iron deficit is 1225 mg.  As a result, Injectafer 750 mg supportive therapy plan is built and ordered.  Injectafer 750 mg today and again next week.  Nursing, in accordance with IV iron administration protocol, will monitor for acute side effects/toxicities associated with IV iron administration today.  He is scheduled for 2 units PRBCs today.  I will cancel 1  unit.  We will give 1 unit of PRBCs with IV iron today.  Nursing, in accordance with blood product administration protocol, will monitor for acute side effects/toxicities associated with blood product administration today.  Labs today: CBC diff, CMET, sample to blood bank.  UA added as well for recent history of hematuria.  UA demonstrates ongoing hematuria and leukocytes in urine.  Will send off urine for culture and sensitivity.    Based upon his UA ~ 2-3 weeks ago, UTI was sensitive to Levaquin.  Rx is printed for Levaquin 500 mg PO on day 1  and then 250 mg PO daily for a 7 day course of antibiotic, dose adjusted for renal function.  If culture confirms ongoing UTI, will refer the patient back to Dr. Alyson Ingles for consideration of sooner appt than 07/16/2017.  Patient has ureter stents in place and these may be infected.  He also is scheduled for stent exchange in September 2018.    Labs in 1 week: CBC, sample to blood bank.  Labs in 4 weeks: CBC diff, BMET, iron/TIBC, ferritin, B12, folate, EPO level, retic count, and UA.  Return in 4 weeks for follow-up.   ORDERS PLACED FOR THIS ENCOUNTER: Orders Placed This Encounter  Procedures  . Urine culture  . Urinalysis, Routine w reflex microscopic  . CBC  . CBC with Differential  . Basic metabolic panel  . Vitamin B12  . Folate  . Iron and TIBC  . Ferritin  . Erythropoietin  . Reticulocytes  . Urinalysis, Routine w reflex microscopic  . Sample to Blood Bank  . Sample to Blood Bank    MEDICATIONS PRESCRIBED THIS ENCOUNTER: Meds ordered this encounter  Medications  . levofloxacin (LEVAQUIN) 250 MG tablet    Sig: Take 500 mg PO today and then 250 mg PO until Rx is complete    Dispense:  8 tablet    Refill:  0    Order Specific Question:   Supervising Provider    Answer:   Brunetta Genera [[id number]]    THERAPY PLAN:  Will replace iron with IV Injectafer 1500 mg.  Will give 1 unit of PRBCs.  GI blood loss is source of  IDA/anemia.  I have messaged GI about positive stool card results x 3.  Will continue to support vitamin deficiency as needed.  All questions were answered. The patient knows to call the clinic with any problems, questions or concerns. We can certainly see the patient much sooner if necessary.  Patient and plan discussed with Dr. Twana First and she is in agreement with the aforementioned.   This note is electronically signed by: Doy Mince 06/30/2017 10:40 AM

## 2017-06-30 ENCOUNTER — Encounter (HOSPITAL_COMMUNITY): Payer: Medicare Other

## 2017-06-30 ENCOUNTER — Encounter (HOSPITAL_COMMUNITY): Payer: Medicare Other | Attending: Oncology | Admitting: Oncology

## 2017-06-30 ENCOUNTER — Encounter (HOSPITAL_BASED_OUTPATIENT_CLINIC_OR_DEPARTMENT_OTHER): Payer: Medicare Other

## 2017-06-30 ENCOUNTER — Encounter (HOSPITAL_COMMUNITY): Payer: Self-pay | Admitting: Oncology

## 2017-06-30 VITALS — BP 138/66 | HR 77 | Resp 16 | Ht 74.0 in | Wt 199.0 lb

## 2017-06-30 VITALS — BP 134/57 | HR 71 | Temp 97.7°F | Resp 18

## 2017-06-30 DIAGNOSIS — K219 Gastro-esophageal reflux disease without esophagitis: Secondary | ICD-10-CM | POA: Diagnosis not present

## 2017-06-30 DIAGNOSIS — I2510 Atherosclerotic heart disease of native coronary artery without angina pectoris: Secondary | ICD-10-CM | POA: Diagnosis not present

## 2017-06-30 DIAGNOSIS — R8299 Other abnormal findings in urine: Secondary | ICD-10-CM

## 2017-06-30 DIAGNOSIS — E538 Deficiency of other specified B group vitamins: Secondary | ICD-10-CM | POA: Insufficient documentation

## 2017-06-30 DIAGNOSIS — I129 Hypertensive chronic kidney disease with stage 1 through stage 4 chronic kidney disease, or unspecified chronic kidney disease: Secondary | ICD-10-CM | POA: Diagnosis not present

## 2017-06-30 DIAGNOSIS — F329 Major depressive disorder, single episode, unspecified: Secondary | ICD-10-CM | POA: Insufficient documentation

## 2017-06-30 DIAGNOSIS — D509 Iron deficiency anemia, unspecified: Secondary | ICD-10-CM

## 2017-06-30 DIAGNOSIS — D500 Iron deficiency anemia secondary to blood loss (chronic): Secondary | ICD-10-CM | POA: Diagnosis not present

## 2017-06-30 DIAGNOSIS — Z87891 Personal history of nicotine dependence: Secondary | ICD-10-CM | POA: Insufficient documentation

## 2017-06-30 DIAGNOSIS — R82998 Other abnormal findings in urine: Secondary | ICD-10-CM

## 2017-06-30 DIAGNOSIS — E785 Hyperlipidemia, unspecified: Secondary | ICD-10-CM | POA: Insufficient documentation

## 2017-06-30 DIAGNOSIS — E1122 Type 2 diabetes mellitus with diabetic chronic kidney disease: Secondary | ICD-10-CM | POA: Diagnosis not present

## 2017-06-30 DIAGNOSIS — D649 Anemia, unspecified: Secondary | ICD-10-CM

## 2017-06-30 DIAGNOSIS — R319 Hematuria, unspecified: Secondary | ICD-10-CM | POA: Diagnosis not present

## 2017-06-30 DIAGNOSIS — N183 Chronic kidney disease, stage 3 (moderate): Secondary | ICD-10-CM

## 2017-06-30 LAB — SAMPLE TO BLOOD BANK

## 2017-06-30 LAB — URINALYSIS, MICROSCOPIC (REFLEX): SQUAMOUS EPITHELIAL / LPF: NONE SEEN

## 2017-06-30 LAB — OCCULT BLOOD X 1 CARD TO LAB, STOOL
FECAL OCCULT BLD: POSITIVE — AB
Fecal Occult Bld: POSITIVE — AB
Fecal Occult Bld: POSITIVE — AB

## 2017-06-30 LAB — CBC WITH DIFFERENTIAL/PLATELET
BASOS PCT: 0 %
Basophils Absolute: 0 10*3/uL (ref 0.0–0.1)
EOS ABS: 0.2 10*3/uL (ref 0.0–0.7)
EOS PCT: 4 %
HCT: 26.9 % — ABNORMAL LOW (ref 39.0–52.0)
Hemoglobin: 8.6 g/dL — ABNORMAL LOW (ref 13.0–17.0)
LYMPHS ABS: 0.9 10*3/uL (ref 0.7–4.0)
Lymphocytes Relative: 19 %
MCH: 28.6 pg (ref 26.0–34.0)
MCHC: 32 g/dL (ref 30.0–36.0)
MCV: 89.4 fL (ref 78.0–100.0)
MONOS PCT: 9 %
Monocytes Absolute: 0.5 10*3/uL (ref 0.1–1.0)
Neutro Abs: 3.3 10*3/uL (ref 1.7–7.7)
Neutrophils Relative %: 68 %
PLATELETS: 143 10*3/uL — AB (ref 150–400)
RBC: 3.01 MIL/uL — AB (ref 4.22–5.81)
RDW: 14.7 % (ref 11.5–15.5)
WBC: 4.8 10*3/uL (ref 4.0–10.5)

## 2017-06-30 LAB — COMPREHENSIVE METABOLIC PANEL
ALT: 18 U/L (ref 17–63)
AST: 21 U/L (ref 15–41)
Albumin: 3.1 g/dL — ABNORMAL LOW (ref 3.5–5.0)
Alkaline Phosphatase: 53 U/L (ref 38–126)
Anion gap: 8 (ref 5–15)
BUN: 32 mg/dL — ABNORMAL HIGH (ref 6–20)
CHLORIDE: 103 mmol/L (ref 101–111)
CO2: 27 mmol/L (ref 22–32)
Calcium: 9 mg/dL (ref 8.9–10.3)
Creatinine, Ser: 1.93 mg/dL — ABNORMAL HIGH (ref 0.61–1.24)
GFR calc non Af Amer: 31 mL/min — ABNORMAL LOW (ref >60)
GFR, EST AFRICAN AMERICAN: 36 mL/min — AB (ref >60)
Glucose, Bld: 265 mg/dL — ABNORMAL HIGH (ref 65–99)
Potassium: 4.4 mmol/L (ref 3.5–5.1)
SODIUM: 138 mmol/L (ref 135–145)
Total Bilirubin: 0.7 mg/dL (ref 0.3–1.2)
Total Protein: 6.8 g/dL (ref 6.5–8.1)

## 2017-06-30 LAB — URINALYSIS, ROUTINE W REFLEX MICROSCOPIC
BILIRUBIN URINE: NEGATIVE
GLUCOSE, UA: NEGATIVE mg/dL
KETONES UR: NEGATIVE mg/dL
Nitrite: NEGATIVE
PH: 6 (ref 5.0–8.0)
Protein, ur: 100 mg/dL — AB

## 2017-06-30 LAB — TYPE AND SCREEN
ABO/RH(D): B POS
ANTIBODY SCREEN: NEGATIVE
UNIT DIVISION: 0
Unit division: 0

## 2017-06-30 LAB — BPAM RBC
BLOOD PRODUCT EXPIRATION DATE: 201807242359
Blood Product Expiration Date: 201807242359
ISSUE DATE / TIME: 201806291649
UNIT TYPE AND RH: 5100
Unit Type and Rh: 5100

## 2017-06-30 LAB — PREPARE RBC (CROSSMATCH)

## 2017-06-30 MED ORDER — SODIUM CHLORIDE 0.9 % IV SOLN
750.0000 mg | Freq: Once | INTRAVENOUS | Status: AC
  Administered 2017-06-30: 750 mg via INTRAVENOUS
  Filled 2017-06-30: qty 15

## 2017-06-30 MED ORDER — LEVOFLOXACIN 250 MG PO TABS: ORAL_TABLET | ORAL | 0 refills | Status: DC

## 2017-06-30 MED ORDER — SODIUM CHLORIDE 0.9 % IV SOLN
250.0000 mL | Freq: Once | INTRAVENOUS | Status: AC
  Administered 2017-06-30: 250 mL via INTRAVENOUS

## 2017-06-30 MED ORDER — SODIUM CHLORIDE 0.9 % IV SOLN
INTRAVENOUS | Status: DC
  Administered 2017-06-30: 12:00:00 via INTRAVENOUS

## 2017-06-30 NOTE — Patient Instructions (Signed)
[place] at [facility] Discharge Instructions  RECOMMENDATIONS MADE BY THE CONSULTANT AND ANY TEST RESULTS WILL BE SENT TO YOUR REFERRING PHYSICIAN.  You received Injectafer today You also received one unit of blood You need to return next week for another dose of injectafer.  Thank you for choosing [place] at [facility] to provide your oncology and hematology care.  To afford each patient quality time with our provider, please arrive at least 15 minutes before your scheduled appointment time.    If you have a lab appointment with the [place] [facility] please come in thru the  Main Entrance and check in at the main information desk  You need to re-schedule your appointment should you arrive 10 or more minutes late.  We strive to give you quality time with our providers, and arriving late affects you and other patients whose appointments are after yours.  Also, if you no show three or more times for appointments you may be dismissed from the clinic at the providers discretion.     Again, thank you for choosing [facility].  Our hope is that these requests will decrease the amount of time that you wait before being seen by our physicians.       _____________________________________________________________  Should you have questions after your visit to [facility], please contact our office at [phone] between the hours of 8:30 a.m. and 4:30 p.m.  Voicemails left after 4:30 p.m. will not be returned until the following business day.  For prescription refill requests, have your pharmacy contact our office.       Resources For Cancer Patients and their Caregivers ? American Cancer Society: Can assist with transportation, wigs, general needs, runs Look Good Feel Better.        (860)561-6518 ? Cancer Care: Provides financial assistance, online support groups, medication/co-pay assistance.  1-800-813-HOPE  909-041-1229) ? [place] Assists [place] cancer patients and their families through emotional , educational and financial support.  (910)670-7002 ? [place] DSS Where to apply for food stamps, Medicaid and utility assistance. (516)673-7884 ? RCATS: Transportation to medical appointments. 2106303546 ? Social Security Administration: May apply for disability if have a Stage IV cancer. 3646528800 (305)238-8396 ? [employer], Disability and Transit Services: Assists with nutrition, care and transit needs. [place] Support Programs: @10RELATIVEDAYS @ > Cancer Support Group  2nd Tuesday of the month 1pm-2pm, Journey Room  > Creative Journey  3rd Tuesday of the month 1130am-1pm, Journey Room  > Look Good Feel Better  1st Wednesday of the month 10am-12 noon, Journey Room (Call [place] to register [phone])

## 2017-06-30 NOTE — Progress Notes (Signed)
Patient at clinic for [facility] infusion and one unit of blood. Patient tolerated infusions without incidence. Patient discharged ambulatory and in stable condition from clinic with wife to home. Patient to follow up next week as scheduled.

## 2017-06-30 NOTE — Patient Instructions (Signed)
[place] at [facility] Discharge Instructions  RECOMMENDATIONS MADE BY THE CONSULTANT AND ANY TEST RESULTS WILL BE SENT TO YOUR REFERRING PHYSICIAN.  You were seen today by Kirby Crigler PA-C. 1 unit of blood and IV iron today.  IV iron again in 1 week with labs. Return in 4 weeks for labs and follow up.   Thank you for choosing [place] at [facility] to provide your oncology and hematology care.  To afford each patient quality time with our provider, please arrive at least 15 minutes before your scheduled appointment time.    If you have a lab appointment with the [place] please come in thru the  Main Entrance and check in at the main information desk  You need to re-schedule your appointment should you arrive 10 or more minutes late.  We strive to give you quality time with our providers, and arriving late affects you and other patients whose appointments are after yours.  Also, if you no show three or more times for appointments you may be dismissed from the clinic at the providers discretion.     Again, thank you for choosing [facility].  Our hope is that these requests will decrease the amount of time that you wait before being seen by our physicians.       _____________________________________________________________  Should you have questions after your visit to [facility], please contact our office at [phone] between the hours of 8:30 a.m. and 4:30 p.m.  Voicemails left after 4:30 p.m. will not be returned until the following business day.  For prescription refill requests, have your pharmacy contact our office.       Resources For Cancer Patients and their Caregivers ? American Cancer Society: Can assist with transportation, wigs, general needs, runs Look Good Feel Better.        (734)660-3872 ? Cancer Care: Provides financial assistance, online support groups, medication/co-pay  assistance.  1-800-813-HOPE 334 379 8398) ? [place] Assists [place] cancer patients and their families through emotional , educational and financial support.  5632221494 ? [place] DSS Where to apply for food stamps, Medicaid and utility assistance. (775)662-0885 ? RCATS: Transportation to medical appointments. (873)268-8431 ? Social Security Administration: May apply for disability if have a Stage IV cancer. 214-247-1712 323-293-4918 ? [employer], Disability and Transit Services: Assists with nutrition, care and transit needs. [place] Support Programs: @10RELATIVEDAYS @ > Cancer Support Group  2nd Tuesday of the month 1pm-2pm, Journey Room  > Creative Journey  3rd Tuesday of the month 1130am-1pm, Journey Room  > Look Good Feel Better  1st Wednesday of the month 10am-12 noon, Journey Room (Call [place] to register [phone])

## 2017-06-30 NOTE — Progress Notes (Signed)
cc'd to pcp 

## 2017-07-01 LAB — BPAM RBC
Blood Product Expiration Date: 201807242359
ISSUE DATE / TIME: 201807021308
Unit Type and Rh: 5100

## 2017-07-01 LAB — TYPE AND SCREEN
ABO/RH(D): B POS
Antibody Screen: NEGATIVE
UNIT DIVISION: 0

## 2017-07-04 NOTE — Progress Notes (Signed)
Doris: reviewed with Dr. Oneida Alar and she has made addendum. Please have him stop aspirin. We need to recheck CBC in 2 weeks. He will need EGD with capsule if persistent melena/transfusion dependent anemia after holding aspirin for 2 weeks.

## 2017-07-07 ENCOUNTER — Encounter (HOSPITAL_COMMUNITY): Payer: Medicare Other

## 2017-07-07 ENCOUNTER — Encounter (HOSPITAL_BASED_OUTPATIENT_CLINIC_OR_DEPARTMENT_OTHER): Payer: Medicare Other

## 2017-07-07 ENCOUNTER — Encounter (HOSPITAL_COMMUNITY): Payer: Self-pay

## 2017-07-07 VITALS — BP 152/61 | HR 65 | Temp 97.5°F | Resp 20

## 2017-07-07 DIAGNOSIS — D500 Iron deficiency anemia secondary to blood loss (chronic): Secondary | ICD-10-CM

## 2017-07-07 DIAGNOSIS — E1122 Type 2 diabetes mellitus with diabetic chronic kidney disease: Secondary | ICD-10-CM | POA: Diagnosis not present

## 2017-07-07 DIAGNOSIS — D509 Iron deficiency anemia, unspecified: Secondary | ICD-10-CM

## 2017-07-07 DIAGNOSIS — N183 Chronic kidney disease, stage 3 (moderate): Secondary | ICD-10-CM | POA: Diagnosis not present

## 2017-07-07 DIAGNOSIS — I129 Hypertensive chronic kidney disease with stage 1 through stage 4 chronic kidney disease, or unspecified chronic kidney disease: Secondary | ICD-10-CM | POA: Diagnosis not present

## 2017-07-07 DIAGNOSIS — E538 Deficiency of other specified B group vitamins: Secondary | ICD-10-CM | POA: Diagnosis not present

## 2017-07-07 DIAGNOSIS — R319 Hematuria, unspecified: Secondary | ICD-10-CM | POA: Diagnosis not present

## 2017-07-07 LAB — CBC
HEMATOCRIT: 31.7 % — AB (ref 39.0–52.0)
HEMOGLOBIN: 10.1 g/dL — AB (ref 13.0–17.0)
MCH: 28.6 pg (ref 26.0–34.0)
MCHC: 31.9 g/dL (ref 30.0–36.0)
MCV: 89.8 fL (ref 78.0–100.0)
Platelets: 135 10*3/uL — ABNORMAL LOW (ref 150–400)
RBC: 3.53 MIL/uL — AB (ref 4.22–5.81)
RDW: 17.7 % — ABNORMAL HIGH (ref 11.5–15.5)
WBC: 4.4 10*3/uL (ref 4.0–10.5)

## 2017-07-07 MED ORDER — SODIUM CHLORIDE 0.9 % IV SOLN
INTRAVENOUS | Status: DC
  Administered 2017-07-07: 13:00:00 via INTRAVENOUS

## 2017-07-07 MED ORDER — SODIUM CHLORIDE 0.9 % IV SOLN
750.0000 mg | Freq: Once | INTRAVENOUS | Status: AC
  Administered 2017-07-07: 750 mg via INTRAVENOUS
  Filled 2017-07-07: qty 15

## 2017-07-07 NOTE — Progress Notes (Signed)
I spoke with patient. He is to stop aspirin for 2 weeks. He stopped this 2 days ago. He will call me in 10 days with a report.

## 2017-07-07 NOTE — Progress Notes (Signed)
Patient tolerated infusion without incidence. Patient discharged ambulatory and in stable condition from clinic. Follow up as scheduled.

## 2017-07-07 NOTE — Patient Instructions (Signed)
[facility] at [facility] Discharge Instructions  RECOMMENDATIONS MADE BY THE CONSULTANT AND ANY TEST RESULTS WILL BE SENT TO YOUR REFERRING PHYSICIAN.  You received your iron infusion today. Follow up as scheduled.  Thank you for choosing [facility] to provide your oncology and hematology care.  To afford each patient quality time with our provider, please arrive at least 15 minutes before your scheduled appointment time.    If you have a lab appointment with the Cancer Center please come in thru the  Main Entrance and check in at the main information desk  You need to re-schedule your appointment should you arrive 10 or more minutes late.  We strive to give you quality time with our providers, and arriving late affects you and other patients whose appointments are after yours.  Also, if you no show three or more times for appointments you may be dismissed from the clinic at the providers discretion.     Again, thank you for choosing [facility].  Our hope is that these requests will decrease the amount of time that you wait before being seen by our physicians.       _____________________________________________________________  Should you have questions after your visit to [facility], please contact our office at [phone] between the hours of 8:30 a.m. and 4:30 p.m.  Voicemails left after 4:30 p.m. will not be returned until the following business day.  For prescription refill requests, have your pharmacy contact our office.       Resources For Cancer Patients and their Caregivers ? American Cancer Society: Can assist with transportation, wigs, general needs, runs Look Good Feel Better.        1-888-227-6333 ? Cancer Care: Provides financial assistance, online support groups, medication/co-pay assistance.  1-800-813-HOPE (4673) ? [facility] Assists [place]  cancer patients and their families through emotional , educational and financial support.  336-427-4357 ? [place] DSS Where to apply for food stamps, Medicaid and utility assistance. 336-342-1394 ? RCATS: Transportation to medical appointments. 336-347-2287 ? Social Security Administration: May apply for disability if have a Stage IV cancer. 336-342-7796 1-800-772-1213 ? [place] Aging, Disability and Transit Services: Assists with nutrition, care and transit needs. 336-349-2343  Cancer Center Support Programs: @10RELATIVEDAYS@ > Cancer Support Group  2nd Tuesday of the month 1pm-2pm, Journey Room  > Creative Journey  3rd Tuesday of the month 1130am-1pm, Journey Room  > Look Good Feel Better  1st Wednesday of the month 10am-12 noon, Journey Room (Call [employer] to register 1-800-395-5775)    

## 2017-07-08 ENCOUNTER — Other Ambulatory Visit: Payer: Self-pay

## 2017-07-08 DIAGNOSIS — D509 Iron deficiency anemia, unspecified: Secondary | ICD-10-CM

## 2017-07-08 NOTE — Progress Notes (Signed)
Pt is aware to recheck CBC in 2 weeks and I am mailing the orders for him to do then.

## 2017-07-11 ENCOUNTER — Emergency Department (HOSPITAL_COMMUNITY): Payer: Medicare Other

## 2017-07-11 ENCOUNTER — Emergency Department (HOSPITAL_COMMUNITY)
Admission: EM | Admit: 2017-07-11 | Discharge: 2017-07-11 | Disposition: A | Discharge status: Home / Self Care | Payer: Medicare Other | Attending: Emergency Medicine | Admitting: Emergency Medicine

## 2017-07-11 ENCOUNTER — Encounter (HOSPITAL_COMMUNITY): Payer: Self-pay | Admitting: Emergency Medicine

## 2017-07-11 DIAGNOSIS — I2510 Atherosclerotic heart disease of native coronary artery without angina pectoris: Secondary | ICD-10-CM | POA: Diagnosis not present

## 2017-07-11 DIAGNOSIS — I129 Hypertensive chronic kidney disease with stage 1 through stage 4 chronic kidney disease, or unspecified chronic kidney disease: Secondary | ICD-10-CM | POA: Insufficient documentation

## 2017-07-11 DIAGNOSIS — N3090 Cystitis, unspecified without hematuria: Secondary | ICD-10-CM

## 2017-07-11 DIAGNOSIS — K5730 Diverticulosis of large intestine without perforation or abscess without bleeding: Secondary | ICD-10-CM | POA: Diagnosis not present

## 2017-07-11 DIAGNOSIS — Z8546 Personal history of malignant neoplasm of prostate: Secondary | ICD-10-CM | POA: Diagnosis not present

## 2017-07-11 DIAGNOSIS — Z951 Presence of aortocoronary bypass graft: Secondary | ICD-10-CM | POA: Diagnosis not present

## 2017-07-11 DIAGNOSIS — N3001 Acute cystitis with hematuria: Secondary | ICD-10-CM | POA: Diagnosis not present

## 2017-07-11 DIAGNOSIS — E1122 Type 2 diabetes mellitus with diabetic chronic kidney disease: Secondary | ICD-10-CM | POA: Diagnosis not present

## 2017-07-11 DIAGNOSIS — N3091 Cystitis, unspecified with hematuria: Secondary | ICD-10-CM | POA: Diagnosis not present

## 2017-07-11 DIAGNOSIS — Z7982 Long term (current) use of aspirin: Secondary | ICD-10-CM | POA: Diagnosis not present

## 2017-07-11 DIAGNOSIS — Z87891 Personal history of nicotine dependence: Secondary | ICD-10-CM | POA: Diagnosis not present

## 2017-07-11 DIAGNOSIS — N189 Chronic kidney disease, unspecified: Secondary | ICD-10-CM | POA: Diagnosis not present

## 2017-07-11 DIAGNOSIS — R310 Gross hematuria: Secondary | ICD-10-CM | POA: Diagnosis present

## 2017-07-11 DIAGNOSIS — R319 Hematuria, unspecified: Secondary | ICD-10-CM

## 2017-07-11 HISTORY — DX: Crossing vessel and stricture of ureter without hydronephrosis: N13.5

## 2017-07-11 HISTORY — DX: Calculus of kidney: N20.0

## 2017-07-11 HISTORY — DX: Gastrointestinal hemorrhage, unspecified: K92.2

## 2017-07-11 LAB — CBC WITH DIFFERENTIAL/PLATELET
BASOS PCT: 0 %
Basophils Absolute: 0 10*3/uL (ref 0.0–0.1)
EOS ABS: 0.1 10*3/uL (ref 0.0–0.7)
Eosinophils Relative: 2 %
HEMATOCRIT: 30.5 % — AB (ref 39.0–52.0)
Hemoglobin: 9.9 g/dL — ABNORMAL LOW (ref 13.0–17.0)
Lymphocytes Relative: 14 %
Lymphs Abs: 0.8 10*3/uL (ref 0.7–4.0)
MCH: 29.7 pg (ref 26.0–34.0)
MCHC: 32.5 g/dL (ref 30.0–36.0)
MCV: 91.6 fL (ref 78.0–100.0)
MONO ABS: 0.4 10*3/uL (ref 0.1–1.0)
MONOS PCT: 6 %
Neutro Abs: 4.3 10*3/uL (ref 1.7–7.7)
Neutrophils Relative %: 78 %
PLATELETS: 114 10*3/uL — AB (ref 150–400)
RBC: 3.33 MIL/uL — ABNORMAL LOW (ref 4.22–5.81)
RDW: 18.6 % — AB (ref 11.5–15.5)
WBC: 5.6 10*3/uL (ref 4.0–10.5)

## 2017-07-11 LAB — BASIC METABOLIC PANEL
Anion gap: 5 (ref 5–15)
BUN: 23 mg/dL — AB (ref 6–20)
CALCIUM: 8.5 mg/dL — AB (ref 8.9–10.3)
CO2: 28 mmol/L (ref 22–32)
CREATININE: 1.76 mg/dL — AB (ref 0.61–1.24)
Chloride: 105 mmol/L (ref 101–111)
GFR calc Af Amer: 40 mL/min — ABNORMAL LOW (ref >60)
GFR, EST NON AFRICAN AMERICAN: 35 mL/min — AB (ref >60)
GLUCOSE: 216 mg/dL — AB (ref 65–99)
Potassium: 3.8 mmol/L (ref 3.5–5.1)
Sodium: 138 mmol/L (ref 135–145)

## 2017-07-11 LAB — URINALYSIS, MICROSCOPIC (REFLEX)

## 2017-07-11 LAB — URINALYSIS, ROUTINE W REFLEX MICROSCOPIC
GLUCOSE, UA: 500 mg/dL — AB
Nitrite: POSITIVE — AB
PH: 6.5 (ref 5.0–8.0)
Specific Gravity, Urine: 1.025 (ref 1.005–1.030)

## 2017-07-11 MED ORDER — SULFAMETHOXAZOLE-TRIMETHOPRIM 800-160 MG PO TABS: 1.0000 | ORAL_TABLET | Freq: Every day | ORAL | 0 refills | Status: AC

## 2017-07-11 MED ORDER — SULFAMETHOXAZOLE-TRIMETHOPRIM 800-160 MG PO TABS
1.0000 | ORAL_TABLET | Freq: Once | ORAL | Status: AC
  Administered 2017-07-11: 1 via ORAL
  Filled 2017-07-11: qty 1

## 2017-07-11 NOTE — Discharge Instructions (Signed)
Take the prescription as directed.  Call your regular Urologist on Monday to schedule a follow up appointment within the next 3 days.  Return to the Emergency Department immediately sooner if worsening.  ° °

## 2017-07-11 NOTE — ED Provider Notes (Signed)
[facility] DEPT Provider Note   CSN: [id number] Arrival date & time: 07/11/17  1653     History   Chief Complaint Chief Complaint  Patient presents with  . Hematuria    HPI Andrew Zhang is a 83 y.o. male.   Hematuria     Pt was seen at 1800. Per pt, c/o gradual onset and persistence of constant "seeing blood in my urine" that began this morning. Pt states he has hx kidney stones and ureteral stent, last changed in May (2 months ago). States he "might" have "some" dysuria at the end of urination. Denies abd pain, no N/V/D, no back pain, no testicular pain/swelling, no CP/SOB, no other areas of bleeding.    Past Medical History:  Diagnosis Date  . AAA (abdominal aortic aneurysm) ([place])   . Anemia   . Arthritis   . B12 deficiency   . CAD (coronary artery disease)   . Cancer [facility])    radiation for prostate cancer and lupron.  . Complication of anesthesia    sts,"after hearat surgery my stomach didnt wake up like it was supossed to".  . Depression   . Diverticulitis   . DMII (diabetes mellitus, type 2) ([place]) 2008  . GERD (gastroesophageal reflux disease)   . GI bleed   . Hiatal hernia   . History of kidney stones   . HTN (hypertension)   . Hyperlipidemia   . Iron deficiency anemia due to chronic blood loss 01/08/2017  . Kidney stone   . PONV (postoperative nausea and vomiting)   . Prostate cancer ([place])   . Sleep apnea    cannot tolerate, PCP aware  . Ureteral stricture, left     Patient Active Problem List   Diagnosis Date Noted  . Acute blood loss anemia   . Iron deficiency anemia due to chronic blood loss 01/08/2017  . GIB (gastrointestinal bleeding) 09/18/2016  . Melena   . Acute on chronic renal failure ([place]) 07/29/2016  . Anemia 07/29/2016  . Adenocarcinoma of prostate ([place]) 09/06/2014  . Type 2 diabetes mellitus without complications ([place]) 44/81/8563  . BPH (benign prostatic hyperplasia) 12/14/2013  . Malignant neoplasm of prostate ([place])  12/14/2013  . CAD (coronary artery disease) of artery bypass graft 08/27/2013  . HTN (hypertension) 08/27/2013  . Hyperlipidemia 08/27/2013  . S/P CABG x 3 08/27/2013  . Abdominal aortic aneurysm ([place]) 04/22/2013    Past Surgical History:  Procedure Laterality Date  . APPENDECTOMY    . BACK SURGERY     x2  . CATARACT EXTRACTION Left   . CHOLECYSTECTOMY    . COLONOSCOPY  03/2015   Dr. Britta Mccreedy: Diverticulosis, single tubular adenoma removed.  . COLONOSCOPY WITH PROPOFOL N/A 02/25/2017   Dr. Oneida Alar: non-thrombosed external hemorrhoids, significant looping of left colon. severe diverticulosis in recto-sigmoid colon and sigmoid colon. 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 CORONARY ARTERY BYPASS GRAFT    . CYSTOSCOPY     with stent-left kidney. x2  . CYSTOSCOPY W/ URETERAL STENT PLACEMENT Left 05/14/2017   Procedure: CYSTOSCOPY WITH RETROGRADE PYELOGRAM/URETERAL STENT EXCHANGE;  Surgeon: Cleon Gustin, MD;  Location: [facility];  Service: Urology;  Laterality: Left;  . DESCENDING AORTIC ANEURYSM REPAIR W/ STENT     x2  . ESOPHAGOGASTRODUODENOSCOPY N/A 07/30/2016   Dr. Oneida Alar. Nonobstructing Schatzki ring at GE junction, multiple small sessile polyps with no bleeding or stigmata of recent bleeding in the gastric fundus and gastric body.  Benign-appearing intrinsic moderate stenosis found the pylorus status post dilation. Small bowel capsule deployed.  Marland Kitchen GIVENS CAPSULE STUDY     Capsule study is complete to the cecum. No obvious lesions, mass, tumors. Small wisps of blood noted in small bowel secondary to EGD/dilation. Possible few erosions in setting of NSAIDs. No obvious areas of bleeding.       Home Medications    Prior to Admission medications   Medication Sig Start Date End Date Taking? Authorizing Provider  aspirin 81 MG tablet Chew 81 mg by mouth daily.   Yes [provider]  Cyanocobalamin 1000 MCG/ML LIQD Place 1 mL under the tongue daily.    Yes [provider]  finasteride (PROSCAR) 5 MG tablet Take 5 mg by mouth daily.   Yes [provider]  glimepiride (AMARYL) 2 MG tablet Take 2 mg by mouth daily with breakfast.    Yes [provider]  nitroGLYCERIN (NITROSTAT) 0.4 MG SL tablet Place 1 tablet (0.4 mg total) under the tongue every 5 (five) minutes as needed for chest pain. 04/05/16  Yes Herminio Commons, MD  omeprazole (PRILOSEC) 20 MG capsule Take 1 capsule (20 mg total) by mouth 2 (two) times daily before a meal. 07/31/16  Yes Samuella Cota, MD  sertraline (ZOLOFT) 100 MG tablet Take 50 mg by mouth daily.   Yes [provider]  simvastatin (ZOCOR) 80 MG tablet Take 80 mg by mouth at bedtime.   Yes [provider]  tamsulosin (FLOMAX) 0.4 MG CAPS Take 0.4 mg by mouth daily after supper.   Yes [provider]  traMADol (ULTRAM) 50 MG tablet Take 1 tablet (50 mg total) by mouth every 6 (six) hours as needed. 05/14/17 05/14/18 Yes McKenzie, Candee Furbish, MD  levofloxacin (LEVAQUIN) 250 MG tablet Take 500 mg PO today and then 250 mg PO until Rx is complete Patient not taking: Reported on 07/11/2017 06/30/17   Baird Cancer, PA-C    Family History Family History  Problem Relation Age of Onset  . Heart disease Unknown   . Diabetes Unknown   . Hypertension Unknown   . Liver cancer Mother   . Colon cancer Neg Hx     Social History Social History  Substance Use Topics  . Smoking status: Former Smoker    Packs/day: 3.00    Years: 45.00    Types: Cigarettes    Start date: 04/19/1951    Quit date: 03/01/1994  . Smokeless tobacco: Never Used  . Alcohol use 0.0 oz/week     Comment: 1-2 beers daily      Allergies   Penicillins   Review of Systems Review of Systems  Genitourinary: Positive for hematuria.  ROS: Statement: All systems negative except as marked or noted in the HPI;  Constitutional: Negative for fever and chills. ; ; Eyes: Negative for eye pain, redness and discharge. ; ; ENMT: Negative for ear pain, hoarseness, nasal congestion, sinus pressure and sore throat. ; ; Cardiovascular: Negative for chest pain, palpitations, diaphoresis, dyspnea and peripheral edema. ; ; Respiratory: Negative for cough, wheezing and stridor. ; ; Gastrointestinal: Negative for nausea, vomiting, diarrhea, abdominal pain, blood in stool, hematemesis, jaundice and rectal bleeding. . ; ; Genitourinary: Negative for flank pain and +dysuria, hematuria. ; ; Genital:  No penile drainage or rash, no testicular pain or swelling, no scrotal rash or swelling. ;; Musculoskeletal: Negative for back pain and neck pain. Negative for swelling and trauma.; ; Skin: Negative for pruritus,  rash, abrasions, blisters, bruising and skin lesion.; ; Neuro: Negative for headache, lightheadedness and neck stiffness. Negative for weakness, altered level of consciousness, altered mental status, extremity weakness, paresthesias, involuntary movement, seizure and syncope.        Physical Exam Updated Vital Signs BP (!) 148/81 (BP Location: Right Arm)   Pulse 80   Temp 98.2 F (36.8 C) (Oral)   Resp 20   Ht 6\' 2"  (1.88 m)   Wt 90.7 kg (200 lb)   SpO2 99%   BMI 25.68 kg/m   Physical Exam 1805: Physical examination:  Nursing notes reviewed; Vital signs and O2 SAT reviewed;  Constitutional: Well developed, Well nourished, Well hydrated, In no acute distress; Head:  Normocephalic, atraumatic; Eyes: EOMI, PERRL, No scleral icterus; ENMT: Mouth and pharynx normal, Mucous membranes moist; Neck: Supple, Full range of motion, No lymphadenopathy; Cardiovascular: Regular rate and rhythm, No gallop; Respiratory: Breath sounds clear & equal bilaterally, No wheezes.  Speaking full sentences with ease, Normal respiratory effort/excursion; Chest: Nontender, Movement normal; Abdomen: Soft, Nontender, Nondistended, Normal bowel  sounds; Genitourinary: No CVA tenderness; Spine:  No midline CS, TS, LS tenderness.;; Extremities: Pulses normal, No tenderness, No edema, No calf edema or asymmetry.; Neuro: AA&Ox3, Major CN grossly intact.  Speech clear. No gross focal motor or sensory deficits in extremities.; Skin: Color normal, Warm, Dry.   ED Treatments / Results  Labs (all labs ordered are listed, but only abnormal results are displayed)   EKG  EKG Interpretation None       Radiology   Procedures Procedures (including critical care time)  Medications Ordered in ED Medications - No data to display   Initial Impression / Assessment and Plan / ED Course  I have reviewed the triage vital signs and the nursing notes.  Pertinent labs & imaging results that were available during my care of the patient were reviewed by me and considered in my medical decision making (see chart for details).  MDM Reviewed: previous chart, nursing note and vitals Reviewed previous: labs Interpretation: labs and CT scan   Results for orders placed or performed during the hospital encounter of 07/11/17  Urinalysis, Routine w reflex microscopic  Result Value Ref Range   Color, Urine RED (A) YELLOW   APPearance TURBID (A) CLEAR   Specific Gravity, Urine 1.025 1.005 - 1.030   pH 6.5 5.0 - 8.0   Glucose, UA 500 (A) NEGATIVE mg/dL   Hgb urine dipstick LARGE (A) NEGATIVE   Bilirubin Urine MODERATE (A) NEGATIVE   Ketones, ur TRACE (A) NEGATIVE mg/dL   Protein, ur >300 (A) NEGATIVE mg/dL   Nitrite POSITIVE (A) NEGATIVE   Leukocytes, UA MODERATE (A) NEGATIVE  Basic metabolic panel  Result Value Ref Range   Sodium 138 135 - 145 mmol/L   Potassium 3.8 3.5 - 5.1 mmol/L   Chloride 105 101 - 111 mmol/L   CO2 28 22 - 32 mmol/L   Glucose, Bld 216 (H) 65 - 99 mg/dL   BUN 23 (H) 6 - 20 mg/dL   Creatinine, Ser 1.76 (H) 0.61 - 1.24 mg/dL   Calcium 8.5 (L) 8.9 - 10.3 mg/dL   GFR calc non Af Amer 35 (L) >60 mL/min   GFR calc Af Amer  40 (L) >60 mL/min   Anion gap 5 5 - 15  CBC with Differential  Result Value Ref Range   WBC 5.6 4.0 - 10.5 K/uL   RBC 3.33 (L) 4.22 - 5.81 MIL/uL   Hemoglobin 9.9 (L) 13.0 -  17.0 g/dL   HCT 30.5 (L) 39.0 - 52.0 %   MCV 91.6 78.0 - 100.0 fL   MCH 29.7 26.0 - 34.0 pg   MCHC 32.5 30.0 - 36.0 g/dL   RDW 18.6 (H) 11.5 - 15.5 %   Platelets 114 (L) 150 - 400 K/uL   Neutrophils Relative % 78 %   Neutro Abs 4.3 1.7 - 7.7 K/uL   Lymphocytes Relative 14 %   Lymphs Abs 0.8 0.7 - 4.0 K/uL   Monocytes Relative 6 %   Monocytes Absolute 0.4 0.1 - 1.0 K/uL   Eosinophils Relative 2 %   Eosinophils Absolute 0.1 0.0 - 0.7 K/uL   Basophils Relative 0 %   Basophils Absolute 0.0 0.0 - 0.1 K/uL  Urinalysis, Microscopic (reflex)  Result Value Ref Range   RBC / HPF TOO NUMEROUS TO COUNT 0 - 5 RBC/hpf   WBC, UA TOO NUMEROUS TO COUNT 0 - 5 WBC/hpf   Bacteria, UA MANY (A) NONE SEEN   Squamous Epithelial / LPF 0-5 (A) NONE SEEN   Mucous PRESENT    Ct Renal Stone Study Result Date: 07/11/2017 CLINICAL DATA:  Hematuria, history kidney stones and stenting ; history prostate cancer, abdominal aortic aneurysm post stenting, type II diabetes mellitus, hypertension, hiatal hernia, former smoker EXAM: CT ABDOMEN AND PELVIS WITHOUT CONTRAST TECHNIQUE: Multidetector CT imaging of the abdomen and pelvis was performed following the standard protocol without IV contrast. Sagittal and coronal MPR images reconstructed from axial data set. Oral contrast not administered for this indication. COMPARISON:  10/31/2016 FINDINGS: Lower chest: Lung bases clear Hepatobiliary: Gallbladder surgically absent. Slightly nodular hepatic margins and prominence of lateral segment LEFT lobe and caudate lobe suggesting cirrhosis. Pancreas: Normal appearance Spleen: Normal appearance Adrenals/Urinary Tract: Adrenal glands normal appearance. LEFT ureteral stent extends from renal pelvis to urinary bladder. Mild dilatation of the LEFT ureter  surrounding the stent. Tiny nonobstructing calculus and cortical scarring at inferior pole LEFT kidney. No additional urinary tract calcification. Mild diffuse bladder wall thickening which could represent incomplete distention, chronic outlet obstruction, or cystitis. Mild perivesicular infiltrative changes. Stomach/Bowel: Extensive diverticulosis of sigmoid colon, less at descending colon without evidence of diverticulitis. Appendix surgically absent. Stomach and bowel loops otherwise normal. Vascular/Lymphatic: Aneurysmal dilatation of the abdominal aorta up to 4.1 x 3.7 cm image 38 previously 4.0 x 3.8 cm. Endoluminal stent within abdominal aorta being the level of the renal arteries and extending into RIGHT common iliac artery and LEFT external iliac artery. Embolization coils at LEFT internal iliac artery. No adenopathy. Reproductive: Prostatic enlargement gland measuring 5.3 x 5.0 x 5.7 cm, elevating bladder base. Scattered prostatic calcifications with a few surgical clips versus seed implants within prostate gland. Other: No free air free fluid. BILATERAL inguinal hernias containing fat. Musculoskeletal: No acute osseous findings. IMPRESSION: Significant sigmoid diverticulosis without definite evidence of diverticulitis. LEFT ureteral stent with mild persistent LEFT ureteral dilatation. Nonobstructing calculus and cortical atrophy at inferior pole LEFT kidney. No additional urinary tract calcifications identified. Significant prostatic enlargement 5.3 x 5.0 x 5.7 cm. Bladder wall thickening which could reflect chronic outlet obstruction or cystitis ; the presence of mild perivesicular infiltration favors urinary tract infection/cystitis, recommend correlation with urinalysis. Post stenting of abdominal aortic aneurysm, stable. BILATERAL inguinal hernias containing fat. Question subtle nodularity of hepatic contours cannot exclude cirrhosis. Aortic Atherosclerosis (ICD10-I70.0). Electronically Signed   By:  Lavonia Dana M.D.   On: 07/11/2017 20:27    2100:  +UTI, UC pending. BUN/Cr elevated per baseline.  H/H per baseline. Normal WBC count. Pt remains afebrile, stable VS.  T/C to Uro Dr. Karsten Ro, case discussed, including:  HPI, pertinent PM/SHx, VS/PE, dx testing, ED course and treatment:  UC then start PO abx, no need for admission, d/c pt and have pt call office Monday for f/u appt. [facility]:  Recommends Septra DS 1 tab PO daily x7-10 days. Dx and testing, as well as d/w Uro MD, d/w pt and family.  Questions answered.  Verb understanding, agreeable to d/c home with outpt f/u.   Final Clinical Impressions(s) / ED Diagnoses   Final diagnoses:  None    New Prescriptions New Prescriptions   No medications on file      Francine Graven, DO 07/16/17 [id number]

## 2017-07-11 NOTE — ED Triage Notes (Signed)
Pt has had multiple test to find to explain blood lost, received 8 pints of blood and iron infusion since last august

## 2017-07-11 NOTE — ED Triage Notes (Signed)
Blood in urine, denies pain, hx of stones, states he has on stent in kidney at present

## 2017-07-14 ENCOUNTER — Telehealth: Payer: Self-pay | Admitting: General Practice

## 2017-07-14 DIAGNOSIS — D509 Iron deficiency anemia, unspecified: Secondary | ICD-10-CM

## 2017-07-14 LAB — URINE CULTURE

## 2017-07-14 NOTE — Progress Notes (Signed)
Patient is not having any black stools and he had a recent CBC done on Friday 7/13th in the ER.  Please advise if we need to proceed with a EGD.  Routing to [place] and Dr. Oneida Alar

## 2017-07-14 NOTE — Telephone Encounter (Signed)
Patient is not having any black stools and he had a recent CBC done on Friday 7/13th in the ER.  He is currently not taking ASA.  Please advise if we need to proceed with an EGD please see 6/27 ov with Vicente Males.  Routing to [place] and Dr. Oneida Alar

## 2017-07-15 ENCOUNTER — Telehealth: Payer: Self-pay | Admitting: *Deleted

## 2017-07-15 DIAGNOSIS — D1800 Hemangioma unspecified site: Secondary | ICD-10-CM | POA: Diagnosis not present

## 2017-07-15 DIAGNOSIS — L570 Actinic keratosis: Secondary | ICD-10-CM | POA: Diagnosis not present

## 2017-07-15 DIAGNOSIS — L821 Other seborrheic keratosis: Secondary | ICD-10-CM | POA: Diagnosis not present

## 2017-07-15 NOTE — Telephone Encounter (Signed)
Patient made aware and lab order faxed to [facility].

## 2017-07-15 NOTE — Telephone Encounter (Signed)
PT HAVING HEMATURIA AND UROLOGIC INTERVENTION. RE-CHECK CBC IN 2 WEEKS.

## 2017-07-15 NOTE — Addendum Note (Signed)
Addended by: Idamae Schuller on: 07/15/2017 09:08 AM   Modules accepted: Orders

## 2017-07-15 NOTE — Telephone Encounter (Signed)
Post ED Visit - Positive Culture Follow-up: Unsuccessful Patient Follow-up  Culture assessed and recommendations reviewed by:  []  Elenor Quinones, Pharm.D. []  Heide Guile, Pharm.D., BCPS AQ-ID []  Parks Neptune, Pharm.D., BCPS [x]  Alycia Rossetti, Pharm.D., BCPS []  [place], [place].D., BCPS, AAHIVP []  Legrand Como, Pharm.D., BCPS, AAHIVP []  Salome Arnt, PharmD, BCPS []  Dimitri Ped, PharmD, BCPS []  Vincenza Hews, PharmD, BCPS  Positive urine culture  []  Patient discharged without antimicrobial prescription and treatment is now indicated [x]  Organism is resistant to prescribed ED discharge antimicrobial []  Patient with positive blood cultures   Unable to contact patient after 3 attempts, letter will be sent to address on file  Ardeen Fillers 07/15/2017, 10:38 AM

## 2017-07-15 NOTE — Progress Notes (Signed)
ED Antimicrobial Stewardship Positive Culture Follow Up   Andrew Zhang is an 83 y.o. male who presented to [facility] on 07/11/2017 with a chief complaint of  Chief Complaint  Patient presents with  . Hematuria    Recent Results (from the past 720 hour(s))  Urine culture     Status: Abnormal   Collection Time: 07/11/17  5:48 PM  Result Value Ref Range Status   Specimen Description URINE, CLEAN CATCH  Final   Special Requests NONE  Final   Culture >=100,000 COLONIES/mL ENTEROCOCCUS FAECALIS (A)  Final   Report Status 07/14/2017 FINAL  Final   Organism ID, Bacteria ENTEROCOCCUS FAECALIS (A)  Final      Susceptibility   Enterococcus faecalis - MIC*    AMPICILLIN <=2 SENSITIVE Sensitive     LEVOFLOXACIN 4 INTERMEDIATE Intermediate     NITROFURANTOIN <=16 SENSITIVE Sensitive     VANCOMYCIN 2 SENSITIVE Sensitive     * >=100,000 COLONIES/mL ENTEROCOCCUS FAECALIS    [x]  Treated with Bactrim, organism resistant to prescribed antimicrobial  The patient is noted to have a history of a rash with prior use of pencillins. The most effective and cheapest option to treat this UTI would be Amoxicillin. If the patient is willing to re-attempt a penicillin - would recommend: - Amoxicillin 500 mg twice daily for 10 days  If the patient does not want to re-attempt a penicillin - the alternative would be: - Fosfomycin 3g po q48h x 3 doses  ED Provider: Bettye Boeck, PA-C  Lawson Radar 07/15/2017, 9:40 AM Infectious Diseases Pharmacist Phone# [phone]

## 2017-07-16 ENCOUNTER — Ambulatory Visit (INDEPENDENT_AMBULATORY_CARE_PROVIDER_SITE_OTHER): Payer: Medicare Other | Admitting: Urology

## 2017-07-16 DIAGNOSIS — R310 Gross hematuria: Secondary | ICD-10-CM

## 2017-07-16 DIAGNOSIS — C61 Malignant neoplasm of prostate: Secondary | ICD-10-CM

## 2017-07-16 NOTE — Telephone Encounter (Signed)
REVIEWED-NO ADDITIONAL RECOMMENDATIONS. 

## 2017-07-17 ENCOUNTER — Telehealth: Payer: Self-pay | Admitting: Gastroenterology

## 2017-07-17 NOTE — Telephone Encounter (Signed)
Pt's wife called requesting husband's medical records. I told her that I would need a signed consent from patient to release his records. She is aware and release was mailed for patient to sign and mail back to [place].

## 2017-07-18 ENCOUNTER — Encounter (HOSPITAL_COMMUNITY): Payer: Self-pay | Admitting: Emergency Medicine

## 2017-07-18 ENCOUNTER — Emergency Department (HOSPITAL_COMMUNITY)
Admission: EM | Admit: 2017-07-18 | Discharge: 2017-07-18 | Disposition: A | Discharge status: Home / Self Care | Payer: Medicare Other | Attending: Emergency Medicine | Admitting: Emergency Medicine

## 2017-07-18 DIAGNOSIS — Z8546 Personal history of malignant neoplasm of prostate: Secondary | ICD-10-CM | POA: Diagnosis not present

## 2017-07-18 DIAGNOSIS — Z87891 Personal history of nicotine dependence: Secondary | ICD-10-CM | POA: Insufficient documentation

## 2017-07-18 DIAGNOSIS — E1122 Type 2 diabetes mellitus with diabetic chronic kidney disease: Secondary | ICD-10-CM | POA: Insufficient documentation

## 2017-07-18 DIAGNOSIS — I129 Hypertensive chronic kidney disease with stage 1 through stage 4 chronic kidney disease, or unspecified chronic kidney disease: Secondary | ICD-10-CM | POA: Insufficient documentation

## 2017-07-18 DIAGNOSIS — Z960 Presence of urogenital implants: Secondary | ICD-10-CM | POA: Insufficient documentation

## 2017-07-18 DIAGNOSIS — R319 Hematuria, unspecified: Secondary | ICD-10-CM | POA: Insufficient documentation

## 2017-07-18 DIAGNOSIS — Z79899 Other long term (current) drug therapy: Secondary | ICD-10-CM | POA: Diagnosis not present

## 2017-07-18 DIAGNOSIS — N189 Chronic kidney disease, unspecified: Secondary | ICD-10-CM | POA: Insufficient documentation

## 2017-07-18 DIAGNOSIS — Z7984 Long term (current) use of oral hypoglycemic drugs: Secondary | ICD-10-CM | POA: Insufficient documentation

## 2017-07-18 LAB — CBC WITH DIFFERENTIAL/PLATELET
Basophils Absolute: 0 10*3/uL (ref 0.0–0.1)
Basophils Relative: 0 %
Eosinophils Absolute: 0.2 10*3/uL (ref 0.0–0.7)
Eosinophils Relative: 3 %
HEMATOCRIT: 36.2 % — AB (ref 39.0–52.0)
Hemoglobin: 11.8 g/dL — ABNORMAL LOW (ref 13.0–17.0)
LYMPHS ABS: 1.1 10*3/uL (ref 0.7–4.0)
LYMPHS PCT: 17 %
MCH: 30.1 pg (ref 26.0–34.0)
MCHC: 32.6 g/dL (ref 30.0–36.0)
MCV: 92.3 fL (ref 78.0–100.0)
MONO ABS: 0.4 10*3/uL (ref 0.1–1.0)
MONOS PCT: 6 %
NEUTROS ABS: 4.7 10*3/uL (ref 1.7–7.7)
Neutrophils Relative %: 74 %
Platelets: 166 10*3/uL (ref 150–400)
RBC: 3.92 MIL/uL — ABNORMAL LOW (ref 4.22–5.81)
RDW: 18.9 % — AB (ref 11.5–15.5)
WBC: 6.3 10*3/uL (ref 4.0–10.5)

## 2017-07-18 LAB — URINALYSIS, ROUTINE W REFLEX MICROSCOPIC
PH: 5 (ref 5.0–8.0)
SPECIFIC GRAVITY, URINE: 1.03 (ref 1.005–1.030)

## 2017-07-18 LAB — URINALYSIS, MICROSCOPIC (REFLEX)

## 2017-07-18 LAB — BASIC METABOLIC PANEL
Anion gap: 8 (ref 5–15)
BUN: 25 mg/dL — ABNORMAL HIGH (ref 6–20)
CALCIUM: 9.2 mg/dL (ref 8.9–10.3)
CHLORIDE: 103 mmol/L (ref 101–111)
CO2: 26 mmol/L (ref 22–32)
CREATININE: 1.96 mg/dL — AB (ref 0.61–1.24)
GFR calc Af Amer: 35 mL/min — ABNORMAL LOW (ref >60)
GFR calc non Af Amer: 30 mL/min — ABNORMAL LOW (ref >60)
GLUCOSE: 173 mg/dL — AB (ref 65–99)
Potassium: 4.2 mmol/L (ref 3.5–5.1)
Sodium: 137 mmol/L (ref 135–145)

## 2017-07-18 NOTE — ED Provider Notes (Signed)
[facility] DEPT Provider Note   CSN: [id number] Arrival date & time: 07/18/17  1412     History   Chief Complaint Chief Complaint  Patient presents with  . Hematuria    HPI Andrew Zhang is a 83 y.o. male.  HPI Patient presents with hematuria. Had it for the last few days. Has ureteral stent. History of anemia. Is currently on Macrobid after culture to the ER and prescribed by PCP. Dr. Alyson Ingles is his urologist. No fevers. States he feels fine. No other bleeding. No lightheadedness or dizziness.   Past Medical History:  Diagnosis Date  . AAA (abdominal aortic aneurysm) ([place])   . Anemia   . Arthritis   . B12 deficiency   . CAD (coronary artery disease)   . Cancer [facility])    radiation for prostate cancer and lupron.  . Complication of anesthesia    sts,"after hearat surgery my stomach didnt wake up like it was supossed to".  . Depression   . Diverticulitis   . DMII (diabetes mellitus, type 2) ([place]) 2008  . GERD (gastroesophageal reflux disease)   . GI bleed   . Hiatal hernia   . History of kidney stones   . HTN (hypertension)   . Hyperlipidemia   . Iron deficiency anemia due to chronic blood loss 01/08/2017  . Kidney stone   . PONV (postoperative nausea and vomiting)   . Prostate cancer ([place])   . Sleep apnea    cannot tolerate, PCP aware  . Ureteral stricture, left     Patient Active Problem List   Diagnosis Date Noted  . Acute blood loss anemia   . Iron deficiency anemia due to chronic blood loss 01/08/2017  . GIB (gastrointestinal bleeding) 09/18/2016  . Melena   . Acute on chronic renal failure ([place]) 07/29/2016  . Anemia 07/29/2016  . Adenocarcinoma of prostate ([place]) 09/06/2014  . Type 2 diabetes mellitus without complications ([place]) 02/72/5366  . BPH (benign prostatic hyperplasia) 12/14/2013  . Malignant neoplasm of prostate ([place]) 12/14/2013  . CAD (coronary artery disease) of artery bypass graft 08/27/2013  . HTN (hypertension) 08/27/2013  .  Hyperlipidemia 08/27/2013  . S/P CABG x 3 08/27/2013  . Abdominal aortic aneurysm ([place]) 04/22/2013    Past Surgical History:  Procedure Laterality Date  . APPENDECTOMY    . BACK SURGERY     x2  . CATARACT EXTRACTION Left   . CHOLECYSTECTOMY    . COLONOSCOPY  03/2015   Dr. Britta Mccreedy: Diverticulosis, single tubular adenoma removed.  . COLONOSCOPY WITH PROPOFOL N/A 02/25/2017   Dr. Oneida Alar: non-thrombosed external hemorrhoids, significant looping of left colon. severe diverticulosis in recto-sigmoid colon and sigmoid colon. 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 CORONARY ARTERY BYPASS GRAFT    . CYSTOSCOPY     with stent-left kidney. x2  . CYSTOSCOPY W/ URETERAL STENT PLACEMENT Left 05/14/2017   Procedure: CYSTOSCOPY WITH RETROGRADE PYELOGRAM/URETERAL STENT EXCHANGE;  Surgeon: Cleon Gustin, MD;  Location: [facility];  Service: Urology;  Laterality: Left;  . DESCENDING AORTIC ANEURYSM REPAIR W/ STENT     x2  . ESOPHAGOGASTRODUODENOSCOPY N/A 07/30/2016   Dr. Oneida Alar. Nonobstructing Schatzki ring at GE junction, multiple small sessile polyps with no bleeding or stigmata of recent bleeding in the gastric fundus and gastric body. Benign-appearing intrinsic moderate stenosis found the pylorus status post dilation. Small bowel capsule deployed.  Marland Kitchen GIVENS CAPSULE STUDY     Capsule study is complete  to the cecum. No obvious lesions, mass, tumors. Small wisps of blood noted in small bowel secondary to EGD/dilation. Possible few erosions in setting of NSAIDs. No obvious areas of bleeding.       Home Medications    Prior to Admission medications   Medication Sig Start Date End Date Taking? Authorizing Provider  Cyanocobalamin 1000 MCG/ML LIQD Place 1 mL under the tongue daily.    Yes [provider]  finasteride (PROSCAR) 5 MG tablet Take 5 mg by mouth daily.   Yes [provider]  glimepiride (AMARYL) 2 MG tablet Take 2 mg by mouth daily with breakfast.    Yes [provider]  nitrofurantoin (MACRODANTIN) 100 MG capsule Take 100 mg by mouth 4 (four) times daily. 7 day course starting on 07/15/2017   Yes [provider]  nitroGLYCERIN (NITROSTAT) 0.4 MG SL tablet Place 1 tablet (0.4 mg total) under the tongue every 5 (five) minutes as needed for chest pain. 04/05/16  Yes Herminio Commons, MD  omeprazole (PRILOSEC) 20 MG capsule Take 1 capsule (20 mg total) by mouth 2 (two) times daily before a meal. 07/31/16  Yes Samuella Cota, MD  sertraline (ZOLOFT) 100 MG tablet Take 50 mg by mouth daily.   Yes [provider]  simvastatin (ZOCOR) 80 MG tablet Take 80 mg by mouth at bedtime.   Yes [provider]  tamsulosin (FLOMAX) 0.4 MG CAPS Take 0.4 mg by mouth daily after supper.   Yes [provider]  aspirin 81 MG tablet Chew 81 mg by mouth daily.    [provider]  levofloxacin (LEVAQUIN) 250 MG tablet Take 500 mg PO today and then 250 mg PO until Rx is complete Patient not taking: Reported on 07/11/2017 06/30/17   Baird Cancer, PA-C  sulfamethoxazole-trimethoprim (BACTRIM DS) 800-160 MG tablet Take 1 tablet by mouth daily. Patient not taking: Reported on 07/18/2017 07/11/17 07/21/17  Francine Graven, DO    Family History Family History  Problem Relation Age of Onset  . Heart disease Unknown   . Diabetes Unknown   . Hypertension Unknown   . Liver cancer Mother   . Colon cancer Neg Hx     Social History Social History  Substance Use Topics  . Smoking status: Former Smoker    Packs/day: 3.00    Years: 45.00    Types: Cigarettes    Start date: 04/19/1951    Quit date: 03/01/1994  . Smokeless tobacco: Never Used  . Alcohol use 0.0 oz/week     Comment: 1-2 beers daily      Allergies   Penicillins   Review of Systems Review of Systems  Constitutional: Negative for appetite change, fatigue and fever.   HENT: Negative for congestion.   Respiratory: Negative for shortness of breath.   Cardiovascular: Negative for chest pain.  Gastrointestinal: Negative for abdominal pain.  Genitourinary: Positive for hematuria. Negative for flank pain.  Musculoskeletal: Negative for back pain.  Neurological: Negative for numbness.  Hematological: Negative for adenopathy.  Psychiatric/Behavioral: Negative for confusion.     Physical Exam Updated Vital Signs BP (!) 142/75   Pulse 73   Temp 97.8 F (36.6 C)   Ht 6\' 2"  (1.88 m)   Wt 90.7 kg (200 lb)   SpO2 96%   BMI 25.68 kg/m   Physical Exam  Constitutional: He appears well-developed.  HENT:  Head: Atraumatic.  Neck: Neck supple.  Cardiovascular: Normal rate.   Pulmonary/Chest: Effort normal.  Abdominal: Soft.  There is no tenderness.  Musculoskeletal: He exhibits no edema.  Neurological: He is alert.  Skin: Skin is warm. Capillary refill takes less than 2 seconds. No pallor.     ED Treatments / Results  Labs (all labs ordered are listed, but only abnormal results are displayed) Labs Reviewed  URINALYSIS, ROUTINE W REFLEX MICROSCOPIC - Abnormal; Notable for the following:       Result Value   Color, Urine RED (*)    APPearance TURBID (*)    Glucose, UA   (*)    Value: TEST NOT REPORTED DUE TO COLOR INTERFERENCE OF URINE PIGMENT   Hgb urine dipstick   (*)    Value: TEST NOT REPORTED DUE TO COLOR INTERFERENCE OF URINE PIGMENT   Bilirubin Urine   (*)    Value: TEST NOT REPORTED DUE TO COLOR INTERFERENCE OF URINE PIGMENT   Ketones, ur   (*)    Value: TEST NOT REPORTED DUE TO COLOR INTERFERENCE OF URINE PIGMENT   Protein, ur   (*)    Value: TEST NOT REPORTED DUE TO COLOR INTERFERENCE OF URINE PIGMENT   Nitrite   (*)    Value: TEST NOT REPORTED DUE TO COLOR INTERFERENCE OF URINE PIGMENT   Leukocytes, UA   (*)    Value: TEST NOT REPORTED DUE TO COLOR INTERFERENCE OF URINE PIGMENT   All other components within normal limits  CBC  WITH DIFFERENTIAL/PLATELET - Abnormal; Notable for the following:    RBC 3.92 (*)    Hemoglobin 11.8 (*)    HCT 36.2 (*)    RDW 18.9 (*)    All other components within normal limits  BASIC METABOLIC PANEL - Abnormal; Notable for the following:    Glucose, Bld 173 (*)    BUN 25 (*)    Creatinine, Ser 1.96 (*)    GFR calc non Af Amer 30 (*)    GFR calc Af Amer 35 (*)    All other components within normal limits  URINALYSIS, MICROSCOPIC (REFLEX) - Abnormal; Notable for the following:    Bacteria, UA FEW (*)    Squamous Epithelial / LPF 0-5 (*)    All other components within normal limits  URINE CULTURE    EKG  EKG Interpretation None       Radiology No results found.  Procedures Procedures (including critical care time)  Medications Ordered in ED Medications - No data to display   Initial Impression / Assessment and Plan / ED Course  I have reviewed the triage vital signs and the nursing notes.  Pertinent labs & imaging results that were available during my care of the patient were reviewed by me and considered in my medical decision making (see chart for details).     Patient with hematuria. History of same with infection. Currently on Macrobid labs reassuring. Discharge home. Culture can be followed by urology or PCP. Dr. Louis Meckel called back after patient been discharged I discussed the case with him.  Final Clinical Impressions(s) / ED Diagnoses   Final diagnoses:  Hematuria, unspecified type    New Prescriptions Discharge Medication List as of 07/18/2017  5:57 PM       Davonna Belling, MD 07/19/17 [phone]

## 2017-07-18 NOTE — ED Triage Notes (Signed)
Pt reports hematuria since last Friday. Pt reports seen for same at PCP office on Monday and reports was diagnosed with "bacterial infection" and started on abx. Pt reports ever since intermittent hematuria with clots. Pt reports hematuria x4 today. Pt reports minimal pain with urination and history of kidney stones. nad noted.

## 2017-07-18 NOTE — Discharge Instructions (Signed)
Follow-up with either Dr. Noah Delaine or your primary doctor in around 3 days for culture results. Return for difficulty urinating lightheadedness dizziness or fevers.

## 2017-07-21 DIAGNOSIS — D509 Iron deficiency anemia, unspecified: Secondary | ICD-10-CM | POA: Diagnosis not present

## 2017-07-21 LAB — CBC WITH DIFFERENTIAL/PLATELET
BASOS ABS: 0 {cells}/uL (ref 0–200)
Basophils Relative: 0 %
EOS ABS: 117 {cells}/uL (ref 15–500)
EOS PCT: 3 %
HCT: 30.8 % — ABNORMAL LOW (ref 38.5–50.0)
HEMOGLOBIN: 9.8 g/dL — AB (ref 13.2–17.1)
LYMPHS ABS: 741 {cells}/uL — AB (ref 850–3900)
Lymphocytes Relative: 19 %
MCH: 30.2 pg (ref 27.0–33.0)
MCHC: 31.8 g/dL — AB (ref 32.0–36.0)
MCV: 94.8 fL (ref 80.0–100.0)
MONOS PCT: 7 %
MPV: 9.6 fL (ref 7.5–12.5)
Monocytes Absolute: 273 cells/uL (ref 200–950)
NEUTROS PCT: 71 %
Neutro Abs: 2769 cells/uL (ref 1500–7800)
Platelets: 128 10*3/uL — ABNORMAL LOW (ref 140–400)
RBC: 3.25 MIL/uL — ABNORMAL LOW (ref 4.20–5.80)
RDW: 18.6 % — ABNORMAL HIGH (ref 11.0–15.0)
WBC: 3.9 10*3/uL (ref 3.8–10.8)

## 2017-07-21 LAB — URINE CULTURE: Culture: 60000 — AB

## 2017-07-22 ENCOUNTER — Telehealth: Payer: Self-pay | Admitting: Emergency Medicine

## 2017-07-22 NOTE — Progress Notes (Signed)
Noted. See phone note.

## 2017-07-22 NOTE — Progress Notes (Signed)
Hgb is down to 9.8, was 11.8. This fluctuates between the 9-11 range. From last documentation 7/16, he did not have any evidence of melena. He was to stop aspirin.   1. Can we find out when he stopped aspirin 2. Is he having any melena?

## 2017-07-22 NOTE — Progress Notes (Signed)
Pt said he stopped ASA a month or so ago. He is not having any melana.  He is having blood clots in his urine and is trying to see PCP and was told to come in Sept.  His doctor at [facility] was killed in accident earlier this year.  I told him he needs to call back up that and see someone earlier than that.  He said he is going to see if he can find PCP in [place], since all of his other doctors are here.   He will let me know tomorrow.

## 2017-07-22 NOTE — Telephone Encounter (Signed)
Post ED Visit - Positive Culture Follow-up  Culture report reviewed by antimicrobial stewardship pharmacist:  []  Elenor Quinones, Pharm.D. []  Heide Guile, Pharm.D., BCPS AQ-ID []  Parks Neptune, Pharm.D., BCPS []  Alycia Rossetti, Pharm.D., BCPS []  [place], Pharm.D., BCPS, AAHIVP []  Legrand Como, Pharm.D., BCPS, AAHIVP []  Salome Arnt, PharmD, BCPS []  Dimitri Ped, PharmD, BCPS []  Vincenza Hews, PharmD, BCPS Jimmy Footman PharmD  Positive urine culture Treated with nitrofurantoin, organism sensitive to the same and no further patient follow-up is required at this time.  Hazle Nordmann 07/22/2017, 10:43 AM

## 2017-07-22 NOTE — Progress Notes (Signed)
Noted. Let's repeat CBC in 7-10 days. Looks like urologic issues may be playing a role. Glad to know he has no melena. Stopping the aspirin was a good thing.

## 2017-07-23 ENCOUNTER — Other Ambulatory Visit: Payer: Self-pay

## 2017-07-23 DIAGNOSIS — D649 Anemia, unspecified: Secondary | ICD-10-CM

## 2017-07-23 NOTE — Progress Notes (Signed)
PT came by the office to let us know that he was able to get in as new pt to see Dr. Meda Coffee on 07/25/2017.

## 2017-07-23 NOTE — Progress Notes (Signed)
FYI to Anna Boone, NP.  

## 2017-07-23 NOTE — Progress Notes (Signed)
PT already has an order by for 07/29/2017 by Dr. Oneida Alar.

## 2017-07-25 ENCOUNTER — Ambulatory Visit (INDEPENDENT_AMBULATORY_CARE_PROVIDER_SITE_OTHER): Payer: Medicare Other | Admitting: Family Medicine

## 2017-07-25 ENCOUNTER — Telehealth: Payer: Self-pay

## 2017-07-25 ENCOUNTER — Encounter: Payer: Self-pay | Admitting: Family Medicine

## 2017-07-25 VITALS — BP 120/60 | HR 80 | Temp 97.8°F | Resp 18 | Ht 74.0 in | Wt 199.1 lb

## 2017-07-25 DIAGNOSIS — I25708 Atherosclerosis of coronary artery bypass graft(s), unspecified, with other forms of angina pectoris: Secondary | ICD-10-CM

## 2017-07-25 DIAGNOSIS — R310 Gross hematuria: Secondary | ICD-10-CM | POA: Diagnosis not present

## 2017-07-25 DIAGNOSIS — E119 Type 2 diabetes mellitus without complications: Secondary | ICD-10-CM

## 2017-07-25 DIAGNOSIS — I10 Essential (primary) hypertension: Secondary | ICD-10-CM | POA: Diagnosis not present

## 2017-07-25 NOTE — Progress Notes (Signed)
Chief Complaint  Patient presents with  . Anemia  . Hematuria  new patient for me He has multiple chronic medical problems that require my review: Vascular disease, CAD, S/P CABG, AAA, stented with hypertesnion.  Sees cardiology Dr Bronson Ing. He has chronic renal failure sees "Dr B" Well controlled DM History prostate cancer, BPH, kidney stones, hydronephrosis with a ureteral stent.  Sees Dr Alyson Ingles.  Went to the ER on 7/13 with hematuria and was given septra.    Was seen by urology on 7/18 with hematuria and took 7 d of nitrofurantion.  Seen again on 7/20 in the ER for hematuria.  His culture grew Enterococcus faecalis, both 7/13 and 7/20.  sensitive to nitrofurantoin.  He is still passing frank blood with clots.  I will repeat culture and notify urology. Hyperlipidemia on a statin Previously took aspirin but this was stopped due to bleeding He has had significant GI bleed, and chronic iron def anemia requiring iron infusions and blood transfusions.  Sees hematology regularly.  He is worried about urine blood loss in the setting of chronic anemia.    Patient Active Problem List   Diagnosis Date Noted  . Acute blood loss anemia   . Iron deficiency anemia due to chronic blood loss 01/08/2017  . Melena   . Acute on chronic renal failure ([place]) 07/29/2016  . Adenocarcinoma of prostate ([place]) 09/06/2014  . Type 2 diabetes mellitus without complications ([place]) 09/32/6712  . CAD (coronary artery disease) of artery bypass graft 08/27/2013  . HTN (hypertension) 08/27/2013  . Hyperlipidemia 08/27/2013  . S/P CABG x 3 08/27/2013  . Abdominal aortic aneurysm ([place]) 04/22/2013    Outpatient Encounter Prescriptions as of 07/25/2017  Medication Sig  . Cyanocobalamin 1000 MCG/ML LIQD Place 1 mL under the tongue daily.   . finasteride (PROSCAR) 5 MG tablet Take 5 mg by mouth daily.  Marland Kitchen glimepiride (AMARYL) 2 MG tablet Take 2 mg by mouth daily with breakfast.   . nitroGLYCERIN (NITROSTAT) 0.4 MG  SL tablet Place 1 tablet (0.4 mg total) under the tongue every 5 (five) minutes as needed for chest pain.  Marland Kitchen omeprazole (PRILOSEC) 20 MG capsule Take 1 capsule (20 mg total) by mouth 2 (two) times daily before a meal.  . sertraline (ZOLOFT) 100 MG tablet Take 50 mg by mouth daily.  . simvastatin (ZOCOR) 80 MG tablet Take 80 mg by mouth at bedtime.  . tamsulosin (FLOMAX) 0.4 MG CAPS Take 0.4 mg by mouth daily after supper.   No facility-administered encounter medications on file as of 07/25/2017.     Past Medical History:  Diagnosis Date  . AAA (abdominal aortic aneurysm) ([place])   . Anemia   . Arthritis   . B12 deficiency   . Blood transfusion without reported diagnosis   . CAD (coronary artery disease)   . Cancer [facility])    radiation for prostate cancer and lupron.  . Cataract   . Complication of anesthesia    sts,"after hearat surgery my stomach didnt wake up like it was supossed to".  . Depression   . Diverticulitis   . DMII (diabetes mellitus, type 2) ([place]) 2008  . GERD (gastroesophageal reflux disease)   . GI bleed   . GIB (gastrointestinal bleeding) 09/18/2016  . Hiatal hernia   . History of kidney stones   . HTN (hypertension)   . Hyperlipidemia   . Iron deficiency anemia due to chronic blood loss 01/08/2017  . Kidney stone   . PONV (postoperative nausea  and vomiting)   . Prostate cancer ([place])   . Sleep apnea    cannot tolerate, PCP aware  . Ureteral stricture, left     Past Surgical History:  Procedure Laterality Date  . APPENDECTOMY    . BACK SURGERY     x2  . CATARACT EXTRACTION Left   . CHOLECYSTECTOMY    . COLONOSCOPY  03/2015   Dr. Britta Mccreedy: Diverticulosis, single tubular adenoma removed.  . COLONOSCOPY WITH PROPOFOL N/A 02/25/2017   Dr. Oneida Alar: non-thrombosed external hemorrhoids, significant looping of left colon. severe diverticulosis in recto-sigmoid colon and sigmoid colon. radiation proctitis contributing to transfusion dependent anemia, exacerbated by  chronic renal insufficiency and thrombocytopenia/aspirin use  . CORONARY ARTERY BYPASS GRAFT     1995  . CYSTOSCOPY     with stent-left kidney. x2  . CYSTOSCOPY W/ URETERAL STENT PLACEMENT Left 05/14/2017   Procedure: CYSTOSCOPY WITH RETROGRADE PYELOGRAM/URETERAL STENT EXCHANGE;  Surgeon: Cleon Gustin, MD;  Location: [facility];  Service: Urology;  Laterality: Left;  . DESCENDING AORTIC ANEURYSM REPAIR W/ STENT     x2  . ESOPHAGOGASTRODUODENOSCOPY N/A 07/30/2016   Dr. Oneida Alar. Nonobstructing Schatzki ring at GE junction, multiple small sessile polyps with no bleeding or stigmata of recent bleeding in the gastric fundus and gastric body. Benign-appearing intrinsic moderate stenosis found the pylorus status post dilation. Small bowel capsule deployed.  Marland Kitchen GIVENS CAPSULE STUDY     Capsule study is complete to the cecum. No obvious lesions, mass, tumors. Small wisps of blood noted in small bowel secondary to EGD/dilation. Possible few erosions in setting of NSAIDs. No obvious areas of bleeding.  Marland Kitchen HERNIA REPAIR    . SPINE SURGERY     ruptured discs    Social History   Social History  . Marital status: Married    Spouse name: [name]  . Number of children: 5  . Years of education: 33- GED   Occupational History  . retired     Contractor   Social History Main Topics  . Smoking status: Former Smoker    Packs/day: 3.00    Years: 45.00    Types: Cigarettes    Start date: 04/19/1951    Quit date: 03/01/1994  . Smokeless tobacco: Never Used  . Alcohol use 0.0 oz/week     Comment: 1-2 beers daily   . Drug use: No  . Sexual activity: Yes    Birth control/ protection: None     Comment: married 59 years - 5 children    Other Topics Concern  . Not on file   Social History Narrative   Married to [place] for 60+ years   4 years in the [place]   Retired, mows grass    Family History  Problem Relation Age of Onset  . Heart disease Unknown   . Diabetes Unknown   . Hypertension Unknown   .  Liver cancer Mother   . Arthritis Mother   . Cancer Mother        died at 88  . Heart disease Mother   . Alzheimer's disease Father   . Cancer Brother        lung  . Cancer Son 57       lung  . Hepatitis Brother   . Alcohol abuse Brother   . Heart disease Son 83       heart attack, stents  . Diabetes Son   . Colon cancer Neg Hx     Review of Systems  Constitutional:  Negative for chills, diaphoresis, fever and weight loss.  HENT: Positive for hearing loss. Negative for congestion.   Eyes: Positive for blurred vision. Negative for pain.  Respiratory: Negative for cough and shortness of breath.   Cardiovascular: Negative for chest pain and leg swelling.  Gastrointestinal: Negative for abdominal pain, blood in stool, constipation, diarrhea, heartburn and melena.  Genitourinary: Positive for frequency and hematuria. Negative for dysuria.       Some dribbling  Musculoskeletal: Positive for joint pain. Negative for falls and myalgias.  Neurological: Negative for dizziness, seizures, weakness and headaches.  Psychiatric/Behavioral: Negative for depression. The patient is not nervous/anxious and does not have insomnia.     BP 120/60 (BP Location: Left Arm, Patient Position: Sitting, Cuff Size: Normal)   Pulse 80   Temp 97.8 F (36.6 C) (Temporal)   Resp 18   Ht 6\' 2"  (1.88 m)   Wt 199 lb 1.9 oz (90.3 kg)   SpO2 98%   BMI 25.57 kg/m   Physical Exam  Constitutional: He is oriented to person, place, and time. He appears well-developed and well-nourished. No distress.  Pleasant elderly gentleman  HENT:  Head: Normocephalic and atraumatic.  Right Ear: External ear normal.  Left Ear: External ear normal.  Mouth/Throat: Oropharynx is clear and moist.  dentures  Eyes: Pupils are equal, round, and reactive to light. Conjunctivae are normal.  Neck: Normal range of motion. No thyromegaly present.  Cardiovascular: Normal rate, regular rhythm and normal heart sounds.     Pulmonary/Chest: Effort normal and breath sounds normal. He has no rales.  Abdominal: Soft. Bowel sounds are normal. There is no tenderness.  Musculoskeletal: Normal range of motion. He exhibits no edema.  Lymphadenopathy:    He has no cervical adenopathy.  Neurological: He is alert and oriented to person, place, and time.  Skin: There is pallor.  sallow  Psychiatric: He has a normal mood and affect. His behavior is normal.    1. Gross hematuria May be irritation from stent, persistent infection - Urine Culture  2. Coronary artery disease involving coronary bypass graft of native heart with other forms of angina pectoris (HCC) Stable.  asymptomatic  3. Essential hypertension controlled  4. Type 2 diabetes mellitus without complication, without long-term current use of insulin ([place]) Patient states controlled.  Need records.  Due for eye exam 5. Iron def anemia Will check CBC next week if bleeding persists  Greater than 50% of this visit was spent in counseling and coordinating care.  Total face to face time:  60 min discussing history, hematuria, causes, wife [name] present   Patient Instructions  Drink more water We need urine specimen.  No change in medicine  Need records Dr Wenda Overland  See me in one month  I will contact your urinologist about the urine test findings and medicines   [name], MD

## 2017-07-25 NOTE — Patient Instructions (Addendum)
Drink more water We need urine specimen.  No change in medicine  Need records Dr Wenda Overland  See me in one month  I will contact your urinologist about the urine test findings and medicines

## 2017-07-25 NOTE — Telephone Encounter (Signed)
Pt returned call from letter sent to home. Pt has already been placed on Nitrofurantoin by urologist and has appt next week with new PCP. Instructed to inform PCP so they can repeat UC if needed.  Culture shows Nitrofurantoin sensitve .

## 2017-07-26 LAB — URINE CULTURE

## 2017-07-28 ENCOUNTER — Encounter (HOSPITAL_BASED_OUTPATIENT_CLINIC_OR_DEPARTMENT_OTHER): Payer: Medicare Other | Admitting: Oncology

## 2017-07-28 ENCOUNTER — Encounter (HOSPITAL_COMMUNITY): Payer: Medicare Other

## 2017-07-28 DIAGNOSIS — D500 Iron deficiency anemia secondary to blood loss (chronic): Secondary | ICD-10-CM

## 2017-07-28 DIAGNOSIS — N183 Chronic kidney disease, stage 3 (moderate): Secondary | ICD-10-CM

## 2017-07-28 DIAGNOSIS — R310 Gross hematuria: Secondary | ICD-10-CM | POA: Diagnosis not present

## 2017-07-28 DIAGNOSIS — E1122 Type 2 diabetes mellitus with diabetic chronic kidney disease: Secondary | ICD-10-CM | POA: Diagnosis not present

## 2017-07-28 DIAGNOSIS — E538 Deficiency of other specified B group vitamins: Secondary | ICD-10-CM | POA: Diagnosis not present

## 2017-07-28 DIAGNOSIS — R319 Hematuria, unspecified: Secondary | ICD-10-CM | POA: Diagnosis not present

## 2017-07-28 DIAGNOSIS — I129 Hypertensive chronic kidney disease with stage 1 through stage 4 chronic kidney disease, or unspecified chronic kidney disease: Secondary | ICD-10-CM | POA: Diagnosis not present

## 2017-07-28 LAB — CBC WITH DIFFERENTIAL/PLATELET
BASOS ABS: 0 10*3/uL (ref 0.0–0.1)
Basophils Relative: 0 %
Eosinophils Absolute: 0.2 10*3/uL (ref 0.0–0.7)
Eosinophils Relative: 3 %
HCT: 25.1 % — ABNORMAL LOW (ref 39.0–52.0)
HEMOGLOBIN: 8.4 g/dL — AB (ref 13.0–17.0)
LYMPHS PCT: 12 %
Lymphs Abs: 0.8 10*3/uL (ref 0.7–4.0)
MCH: 31.5 pg (ref 26.0–34.0)
MCHC: 33.5 g/dL (ref 30.0–36.0)
MCV: 94 fL (ref 78.0–100.0)
MONO ABS: 0.3 10*3/uL (ref 0.1–1.0)
Monocytes Relative: 5 %
NEUTROS ABS: 5.3 10*3/uL (ref 1.7–7.7)
Neutrophils Relative %: 80 %
Platelets: 142 10*3/uL — ABNORMAL LOW (ref 150–400)
RBC: 2.67 MIL/uL — AB (ref 4.22–5.81)
RDW: 19.5 % — ABNORMAL HIGH (ref 11.5–15.5)
WBC: 6.6 10*3/uL (ref 4.0–10.5)

## 2017-07-28 LAB — IRON AND TIBC
Iron: 75 ug/dL (ref 45–182)
SATURATION RATIOS: 23 % (ref 17.9–39.5)
TIBC: 323 ug/dL (ref 250–450)
UIBC: 248 ug/dL

## 2017-07-28 LAB — RETICULOCYTES
RBC.: 2.67 MIL/uL — AB (ref 4.22–5.81)
RETIC CT PCT: 5.9 % — AB (ref 0.4–3.1)
Retic Count, Absolute: 157.5 10*3/uL (ref 19.0–186.0)

## 2017-07-28 LAB — BASIC METABOLIC PANEL
Anion gap: 7 (ref 5–15)
BUN: 29 mg/dL — AB (ref 6–20)
CO2: 24 mmol/L (ref 22–32)
CREATININE: 1.85 mg/dL — AB (ref 0.61–1.24)
Calcium: 8.5 mg/dL — ABNORMAL LOW (ref 8.9–10.3)
Chloride: 105 mmol/L (ref 101–111)
GFR calc Af Amer: 38 mL/min — ABNORMAL LOW (ref >60)
GFR calc non Af Amer: 33 mL/min — ABNORMAL LOW (ref >60)
Glucose, Bld: 255 mg/dL — ABNORMAL HIGH (ref 65–99)
Potassium: 3.8 mmol/L (ref 3.5–5.1)
SODIUM: 136 mmol/L (ref 135–145)

## 2017-07-28 LAB — FOLATE: Folate: 15.4 ng/mL (ref >5.9)

## 2017-07-28 LAB — FERRITIN: Ferritin: 95 ng/mL (ref 24–336)

## 2017-07-28 LAB — VITAMIN B12: VITAMIN B 12: 1947 pg/mL — AB (ref 180–914)

## 2017-07-28 NOTE — Patient Instructions (Addendum)
[place] at [facility] Discharge Instructions  RECOMMENDATIONS MADE BY THE CONSULTANT AND ANY TEST RESULTS WILL BE SENT TO YOUR REFERRING PHYSICIAN.    Thank you for choosing [place] at [facility] to provide your oncology and hematology care.  To afford each patient quality time with our provider, please arrive at least 15 minutes before your scheduled appointment time.   You were seen today by Kirby Crigler PA-C. Continue weekly labs. Return in 1 month for follow up.   If you have a lab appointment with the [facility] please come in thru the  Main Entrance and check in at the main information desk  You need to re-schedule your appointment should you arrive 10 or more minutes late.  We strive to give you quality time with our providers, and arriving late affects you and other patients whose appointments are after yours.  Also, if you no show three or more times for appointments you may be dismissed from the clinic at the providers discretion.     Again, thank you for choosing [facility].  Our hope is that these requests will decrease the amount of time that you wait before being seen by our physicians.       _____________________________________________________________  Should you have questions after your visit to [facility], please contact our office at [phone] between the hours of 8:30 a.m. and 4:30 p.m.  Voicemails left after 4:30 p.m. will not be returned until the following business day.  For prescription refill requests, have your pharmacy contact our office.       Resources For Cancer Patients and their Caregivers ? American Cancer Society: Can assist with transportation, wigs, general needs, runs Look Good Feel Better.        309 830 4497 ? Cancer Care: Provides financial assistance, online support groups, medication/co-pay assistance.  1-800-813-HOPE (514)019-9924) ? [facility] Assists [place] cancer patients and their families through emotional , educational and financial support.  (818)290-2785 ? [place] DSS Where to apply for food stamps, Medicaid and utility assistance. 905 390 1127 ? [facility]: Transportation to medical appointments. (772) 214-6407 ? Social Security Administration: May apply for disability if have a Stage IV cancer. 760 610 1397 705-859-3674 ? [employer], Disability and Transit Services: Assists with nutrition, care and transit needs. [place] Support Programs: @10RELATIVEDAYS @ > Cancer Support Group  2nd Tuesday of the month 1pm-2pm, Journey Room  > Creative Journey  3rd Tuesday of the month 1130am-1pm, Journey Room  > Look Good Feel Better  1st Wednesday of the month 10am-12 noon, Journey Room (Call [place] to register [phone])

## 2017-07-28 NOTE — Progress Notes (Signed)
Andrew Everts, MD [phone] [place] [address]  Iron deficiency anemia due to chronic blood loss - Plan: CBC with Differential, Iron and TIBC, Ferritin  CURRENT THERAPY: Vitamin support  INTERVAL HISTORY: Andrew Zhang 83 y.o. 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 AND Prostate carcinoma, on Depo-Lupron managed by urology. AND Gross hematuria  HPI Elements   Location: Blood   Quality: Anemia   Severity: Severe, requiring PRBCs in past   Duration: Since at least 01/2016   Context: Stage III chronic renal disease, iron deficiency, GI blood loss, gross hematuria  Timing:   Modifying Factors: Fatigue, tiredness, history of chest pain.   Associated Signs & Symptoms:    He reports ongoing gross hematuria with passing of clots. He notes that his urine is the color of "red coolaide."  He reports having a follow-up appointment with Dr. Alyson Zhang, urology, in September. He notes that Dr. Meda Zhang, primary care provider, has been in touch with urology.  He denies any identifiable blood in his stool.  He notes that he feels well.  Review of Systems  Constitutional: Negative.  Negative for chills, fever and weight loss.  HENT: Negative.   Eyes: Negative.   Respiratory: Negative.  Negative for cough.   Cardiovascular: Negative.  Negative for chest pain.  Gastrointestinal: Negative.  Negative for blood in stool, constipation, diarrhea, melena, nausea and vomiting.  Genitourinary: Positive for hematuria.  Musculoskeletal: Negative.   Skin: Negative.   Neurological: Negative.  Negative for weakness.  Endo/Heme/Allergies:  Negative.   Psychiatric/Behavioral: Negative.     Past Medical History:  Diagnosis Date  . AAA (abdominal aortic aneurysm) ([place])   . Anemia   . Arthritis   . B12 deficiency   . Blood transfusion without reported diagnosis   . CAD (coronary artery disease)   . Cancer [facility])    radiation for prostate cancer and lupron.  . Cataract   . Complication of anesthesia    sts,"after hearat surgery my stomach didnt wake up like it was supossed to".  . Depression   . Diverticulitis   . DMII (diabetes mellitus, type 2) ([place]) 2008  . GERD (gastroesophageal reflux disease)   . GI bleed   . GIB (gastrointestinal bleeding) 09/18/2016  . Hiatal hernia   . History of kidney stones   . HTN (hypertension)   . Hyperlipidemia   . Iron deficiency anemia due to chronic blood loss 01/08/2017  . Kidney stone   . PONV (postoperative nausea and vomiting)   . Prostate cancer ([place])   . Sleep apnea    cannot tolerate, PCP aware  . Ureteral stricture, left     Past Surgical History:  Procedure Laterality Date  . APPENDECTOMY    . BACK SURGERY     x2  . CATARACT EXTRACTION Left   . CHOLECYSTECTOMY    . COLONOSCOPY  03/2015   Dr. Britta Zhang: Diverticulosis, single tubular adenoma removed.  . COLONOSCOPY WITH PROPOFOL N/A 02/25/2017   Dr. Oneida Zhang: non-thrombosed external hemorrhoids, significant looping of left colon. severe diverticulosis in recto-sigmoid colon and sigmoid colon. 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 CORONARY ARTERY BYPASS GRAFT     1995  . CYSTOSCOPY     with stent-left kidney. x2  . CYSTOSCOPY W/ URETERAL STENT PLACEMENT Left 05/14/2017   Procedure: CYSTOSCOPY WITH RETROGRADE PYELOGRAM/URETERAL STENT EXCHANGE;  Surgeon: Andrew Gustin, MD;  Location: [facility];  Service: Urology;  Laterality: Left;  . DESCENDING AORTIC ANEURYSM REPAIR W/ STENT     x2  . ESOPHAGOGASTRODUODENOSCOPY  N/A 07/30/2016   Dr. Oneida Zhang. Nonobstructing Schatzki ring at GE junction, multiple small sessile polyps with no bleeding or stigmata of recent bleeding in the gastric fundus and gastric body. Benign-appearing intrinsic moderate stenosis found the pylorus status post dilation. Small bowel capsule deployed.  Marland Kitchen GIVENS CAPSULE STUDY     Capsule study is complete to the cecum. No obvious lesions, mass, tumors. Small wisps of blood noted in small bowel secondary to EGD/dilation. Possible few erosions in setting of NSAIDs. No obvious areas of bleeding.  Marland Kitchen HERNIA REPAIR    . SPINE SURGERY     ruptured discs    Family History  Problem Relation Age of Onset  . Heart disease Unknown   . Diabetes Unknown   . Hypertension Unknown   . Liver cancer Mother   . Arthritis Mother   . Cancer Mother        died at 92  . Heart disease Mother   . Alzheimer's disease Father   . Cancer Brother        lung  . Cancer Son 31       lung  . Hepatitis Brother   . Alcohol abuse Brother   . Heart disease Son 22       heart attack, stents  . Diabetes Son   . Colon cancer Neg Hx     Social History   Social History  . Marital status: Married    Spouse name: Andrew Zhang  . Number of children: 5  . Years of education: 7- GED   Occupational History  . retired     Contractor   Social History Main Topics  . Smoking status: Former Smoker    Packs/day: 3.00    Years: 45.00    Types: Cigarettes    Start date: 04/19/1951    Quit date: 03/01/1994  . Smokeless tobacco: Never Used  . Alcohol use 0.0 oz/week     Comment: 1-2 beers daily   . Drug use: No  . Sexual activity: Yes    Birth control/ protection: None     Comment: married 59 years - 5 children    Other Topics Concern  . Not on file   Social History Narrative   Married to Andrew Zhang for 60+ years   4 years in the [place]   Retired, mows grass     PHYSICAL EXAMINATION  ECOG PERFORMANCE STATUS: 0 - Asymptomatic  There were no vitals filed for this  visit.  BP 129/61 Pulse 83 excited respirations 18 Temperature 97.7 Oxygen saturation on room air 99%.  GENERAL:alert, no distress, well nourished, well developed, comfortable, cooperative, smiling and accompanied by his wife. SKIN: skin color, texture, turgor are normal, no rashes or significant lesions HEAD: Normocephalic, No masses, lesions, tenderness or abnormalities EYES: normal, EOMI EARS: External ears normal OROPHARYNX:lips, buccal mucosa, and tongue normal and mucous membranes are moist  NECK: supple, trachea midline LYMPH:  no palpable lymphadenopathy BREAST:not examined LUNGS: clear to auscultation  HEART: regular rate & rhythm ABDOMEN:abdomen soft and normal bowel sounds BACK:  Back symmetric, no curvature. EXTREMITIES:less then 2 second capillary refill, no joint deformities, effusion, or inflammation, no skin discoloration, no cyanosis  NEURO: alert & oriented x 3 with fluent speech, no focal motor/sensory deficits, gait normal   LABORATORY DATA: CBC    Component Value Date/Time   WBC 6.6 07/28/2017 1329   RBC 2.67 (L) 07/28/2017 1329   RBC 2.67 (L) 07/28/2017 1329   HGB 8.4 (L) 07/28/2017 1329   HCT 25.1 (L) 07/28/2017 1329   HCT 30.2 (L) 08/02/2016 1439   PLT 142 (L) 07/28/2017 1329   MCV 94.0 07/28/2017 1329   MCH 31.5 07/28/2017 1329   MCHC 33.5 07/28/2017 1329   RDW 19.5 (H) 07/28/2017 1329   LYMPHSABS 0.8 07/28/2017 1329   MONOABS 0.3 07/28/2017 1329   EOSABS 0.2 07/28/2017 1329   BASOSABS 0.0 07/28/2017 1329      Chemistry      Component Value Date/Time   NA 136 07/28/2017 1329   K 3.8 07/28/2017 1329   CL 105 07/28/2017 1329   CO2 24 07/28/2017 1329   BUN 29 (H) 07/28/2017 1329   CREATININE 1.85 (H) 07/28/2017 1329      Component Value Date/Time   CALCIUM 8.5 (L) 07/28/2017 1329   ALKPHOS 53 06/30/2017 0909   AST 21 06/30/2017 0909   ALT 18 06/30/2017 0909   BILITOT 0.7 06/30/2017 0909        PENDING LABS:   RADIOGRAPHIC  STUDIES:  Ct Renal Stone Study  Result Date: 07/11/2017 CLINICAL DATA:  Hematuria, history kidney stones and stenting ; history prostate cancer, abdominal aortic aneurysm post stenting, type II diabetes mellitus, hypertension, hiatal hernia, former smoker EXAM: CT ABDOMEN AND PELVIS WITHOUT CONTRAST TECHNIQUE: Multidetector CT imaging of the abdomen and pelvis was performed following the standard protocol without IV contrast. Sagittal and coronal MPR images reconstructed from axial data set. Oral contrast not administered for this indication. COMPARISON:  10/31/2016 FINDINGS: Lower chest: Lung bases clear Hepatobiliary: Gallbladder surgically absent. Slightly nodular hepatic margins and prominence of lateral segment LEFT lobe and caudate lobe suggesting cirrhosis. Pancreas: Normal appearance Spleen: Normal appearance Adrenals/Urinary Tract: Adrenal glands normal appearance. LEFT ureteral stent extends from renal pelvis to urinary bladder. Mild dilatation of the LEFT ureter surrounding the stent. Tiny nonobstructing calculus and cortical scarring at inferior pole LEFT kidney. No additional urinary tract calcification. Mild diffuse bladder wall thickening which could represent incomplete distention, chronic outlet obstruction, or cystitis. Mild perivesicular infiltrative changes. Stomach/Bowel: Extensive diverticulosis of sigmoid colon, less at descending colon without evidence of diverticulitis. Appendix surgically absent. Stomach and bowel loops otherwise normal. Vascular/Lymphatic: Aneurysmal dilatation of the abdominal aorta up to 4.1 x 3.7 cm image 38 previously 4.0 x 3.8 cm. Endoluminal stent within abdominal aorta being the level of the renal arteries and extending into RIGHT common iliac artery and LEFT external iliac artery. Embolization coils at LEFT internal iliac artery. No adenopathy. Reproductive: Prostatic enlargement gland measuring 5.3 x 5.0 x 5.7 cm, elevating bladder base. Scattered prostatic  calcifications with a few surgical clips versus seed implants within prostate gland. Other: No free air free fluid. BILATERAL inguinal hernias containing fat. Musculoskeletal: No acute osseous findings. IMPRESSION: Significant sigmoid diverticulosis without definite evidence of diverticulitis. LEFT ureteral stent with mild persistent LEFT ureteral dilatation. Nonobstructing calculus and cortical atrophy at inferior pole LEFT kidney. No additional urinary tract calcifications identified. Significant prostatic enlargement 5.3 x 5.0 x 5.7 cm. Bladder wall thickening which could reflect chronic outlet obstruction or cystitis ; the  presence of mild perivesicular infiltration favors urinary tract infection/cystitis, recommend correlation with urinalysis. Post stenting of abdominal aortic aneurysm, stable. BILATERAL inguinal hernias containing fat. Question subtle nodularity of hepatic contours cannot exclude cirrhosis. Aortic Atherosclerosis (ICD10-I70.0). Electronically Signed   By: Lavonia Dana M.D.   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  Labs today: CBC diff, BMET, anemia panel, EPO level, Retic count.  I personally reviewed and went over laboratory results with the patient.  The results are noted within this dictation.  Patient received 1 unit of packed red blood cells on 06/30/2017 with a hemoglobin of 8.6 g/dL. He then received 750 mg of inject defer on 06/30/2017 and 07/07/2017. Hemoglobin rose from 8.6 g/dL to 11.8 g/dL on 07/18/2017.  In 10 days, his hemoglobin has dropped to 8.4 g/dL.  Labs every week: CBC diff, iron/TIBC,  ferritin.  Per Dr. Francesca Oman note:  Sees Dr Andrew Zhang.  Went to the ER on 7/13 with hematuria and was given septra.    Was seen by urology on 7/18 with hematuria and took 7 d of nitrofurantion.  Seen again on 7/20 in the ER for hematuria.  His culture grew Enterococcus faecalis, both 7/13 and 7/20.  sensitive to nitrofurantoin.  He is still passing frank blood with clots.  I will repeat culture and notify urology.  He reports having follow-up with urology in 2 months.  He may benefit from seeing urology sooner than planned, particularly in light of his gross hematuria and ongoing issues with UTIs in the setting of stent placement in the past.   I suspect anemia of blood loss secondary to ongoing gross hematuria. He may need more IV iron replacement therapy. Based upon iron studies from today and moving forward, will replace iron via IV.  Return in 4 weeks for follow-up.    ORDERS PLACED FOR THIS ENCOUNTER: Orders Placed This Encounter  Procedures  . CBC with Differential  . Iron and TIBC  . Ferritin    MEDICATIONS PRESCRIBED THIS ENCOUNTER: No orders of the defined types were placed in this encounter.   THERAPY PLAN:  Very close surveillance of laboratory work including iron studies. We'll provide IV iron when indicated.  All questions were answered. The patient knows to call the clinic with any problems, questions or concerns. We can certainly see the patient much sooner if necessary.  Patient and plan discussed with Dr. Twana First and she is in agreement with the aforementioned.   This note is electronically signed by: Doy Mince 07/28/2017 3:45 PM

## 2017-07-28 NOTE — Assessment & Plan Note (Addendum)
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  Labs today: CBC diff, BMET, anemia panel, EPO level, Retic count.  I personally reviewed and went over laboratory results with the patient.  The results are noted within this dictation.  Patient received 1 unit of packed red blood cells on 06/30/2017 with a hemoglobin of 8.6 g/dL. He then received 750 mg of inject defer on 06/30/2017 and 07/07/2017. Hemoglobin rose from 8.6 g/dL to 11.8 g/dL on 07/18/2017.  In 10 days, his hemoglobin has dropped to 8.4 g/dL.  Labs every week: CBC diff, iron/TIBC, ferritin.  Per Dr. Francesca Oman note:  Sees Dr Alyson Ingles.  Went to the ER on 7/13 with hematuria and was given septra.    Was seen by urology on 7/18 with hematuria and took 7 d of nitrofurantion.  Seen again on 7/20 in the ER for hematuria.  His culture grew Enterococcus faecalis, both 7/13 and 7/20.  sensitive to nitrofurantoin.  He is still passing frank blood with clots.  I will repeat culture and notify urology.  He reports having follow-up with urology in 2 months.  He may benefit from seeing urology sooner than planned, particularly in light of his gross hematuria and ongoing issues with UTIs in the setting of stent placement in the past.   I suspect anemia of blood loss secondary to ongoing gross hematuria. He may need more IV iron replacement therapy. Based upon iron studies from today and moving forward, will replace iron via IV.  Return in 4 weeks for follow-up.

## 2017-07-29 ENCOUNTER — Other Ambulatory Visit (HOSPITAL_COMMUNITY): Payer: Self-pay | Admitting: Oncology

## 2017-07-29 ENCOUNTER — Telehealth: Payer: Self-pay | Admitting: Family Medicine

## 2017-07-29 NOTE — Telephone Encounter (Signed)
Called patient as directed. Patient is still passing blood. Not as bad, but some. States she has tried to get in to urology but it will be some time before he can be seen- he will not be able to get in this week.

## 2017-07-29 NOTE — Telephone Encounter (Signed)
-----   Message from [name], MD sent at 07/29/2017 10:10 AM EDT -----   ----- Message ----- From: Omer Jack Sent: 07/28/2017   3:46 PM To: [name], MD

## 2017-07-29 NOTE — Telephone Encounter (Signed)
Noted thanks °

## 2017-07-29 NOTE — Telephone Encounter (Signed)
[place] and spoke with triage nurse. Informed her that per Dr Meda Coffee, patient is experiencing gross hematuria that, per Oncology, is the cause of his anemia and requiring a lot of blood transfusions. I explained that Dr Meda Coffee believed it to be a stent complication and that patient needed to be seen this week. Per Triage nurse, they will call patient and get set up and call us back with appointment date and time for our records.

## 2017-07-29 NOTE — Progress Notes (Signed)
Please call Andrew Zhang.  His urine culture did not show infection.  If he is still passing blood in urine needs to go to Urology this week.  I sent Dr Alyson Ingles an e mail last week.  Let me know if I need to call him.  thanks

## 2017-07-30 LAB — ERYTHROPOIETIN: ERYTHROPOIETIN: 76.7 m[IU]/mL — AB (ref 2.6–18.5)

## 2017-08-01 ENCOUNTER — Encounter (HOSPITAL_COMMUNITY): Payer: Medicare Other | Attending: Adult Health

## 2017-08-01 ENCOUNTER — Other Ambulatory Visit (HOSPITAL_COMMUNITY): Payer: Self-pay | Admitting: *Deleted

## 2017-08-01 ENCOUNTER — Encounter (HOSPITAL_COMMUNITY): Payer: Self-pay

## 2017-08-01 VITALS — BP 141/65 | HR 63 | Temp 97.5°F | Resp 18

## 2017-08-01 DIAGNOSIS — Z87891 Personal history of nicotine dependence: Secondary | ICD-10-CM | POA: Insufficient documentation

## 2017-08-01 DIAGNOSIS — D500 Iron deficiency anemia secondary to blood loss (chronic): Secondary | ICD-10-CM

## 2017-08-01 DIAGNOSIS — F329 Major depressive disorder, single episode, unspecified: Secondary | ICD-10-CM | POA: Insufficient documentation

## 2017-08-01 DIAGNOSIS — I714 Abdominal aortic aneurysm, without rupture: Secondary | ICD-10-CM | POA: Insufficient documentation

## 2017-08-01 DIAGNOSIS — E785 Hyperlipidemia, unspecified: Secondary | ICD-10-CM | POA: Insufficient documentation

## 2017-08-01 DIAGNOSIS — K449 Diaphragmatic hernia without obstruction or gangrene: Secondary | ICD-10-CM | POA: Insufficient documentation

## 2017-08-01 DIAGNOSIS — E538 Deficiency of other specified B group vitamins: Secondary | ICD-10-CM | POA: Insufficient documentation

## 2017-08-01 DIAGNOSIS — N183 Chronic kidney disease, stage 3 (moderate): Secondary | ICD-10-CM

## 2017-08-01 DIAGNOSIS — Z8546 Personal history of malignant neoplasm of prostate: Secondary | ICD-10-CM | POA: Insufficient documentation

## 2017-08-01 DIAGNOSIS — Z7984 Long term (current) use of oral hypoglycemic drugs: Secondary | ICD-10-CM | POA: Insufficient documentation

## 2017-08-01 DIAGNOSIS — Z87442 Personal history of urinary calculi: Secondary | ICD-10-CM | POA: Insufficient documentation

## 2017-08-01 DIAGNOSIS — G4730 Sleep apnea, unspecified: Secondary | ICD-10-CM | POA: Insufficient documentation

## 2017-08-01 DIAGNOSIS — D509 Iron deficiency anemia, unspecified: Secondary | ICD-10-CM | POA: Diagnosis present

## 2017-08-01 DIAGNOSIS — I2510 Atherosclerotic heart disease of native coronary artery without angina pectoris: Secondary | ICD-10-CM | POA: Insufficient documentation

## 2017-08-01 DIAGNOSIS — I10 Essential (primary) hypertension: Secondary | ICD-10-CM | POA: Insufficient documentation

## 2017-08-01 DIAGNOSIS — D649 Anemia, unspecified: Secondary | ICD-10-CM | POA: Insufficient documentation

## 2017-08-01 DIAGNOSIS — K5730 Diverticulosis of large intestine without perforation or abscess without bleeding: Secondary | ICD-10-CM | POA: Insufficient documentation

## 2017-08-01 DIAGNOSIS — Z7982 Long term (current) use of aspirin: Secondary | ICD-10-CM | POA: Insufficient documentation

## 2017-08-01 DIAGNOSIS — K219 Gastro-esophageal reflux disease without esophagitis: Secondary | ICD-10-CM | POA: Insufficient documentation

## 2017-08-01 DIAGNOSIS — C61 Malignant neoplasm of prostate: Secondary | ICD-10-CM

## 2017-08-01 DIAGNOSIS — Z951 Presence of aortocoronary bypass graft: Secondary | ICD-10-CM | POA: Insufficient documentation

## 2017-08-01 MED ORDER — SODIUM CHLORIDE 0.9 % IV SOLN
510.0000 mg | Freq: Once | INTRAVENOUS | Status: AC
  Administered 2017-08-01: 510 mg via INTRAVENOUS
  Filled 2017-08-01: qty 17

## 2017-08-01 MED ORDER — SODIUM CHLORIDE 0.9 % IV SOLN
Freq: Once | INTRAVENOUS | Status: AC
  Administered 2017-08-01: 09:00:00 via INTRAVENOUS

## 2017-08-01 NOTE — Progress Notes (Signed)
Tolerated infusion w/o adverse reaction.  Alert, in no distress.  VSS.  Discharged ambulatory.  

## 2017-08-04 ENCOUNTER — Encounter (HOSPITAL_COMMUNITY): Payer: Medicare Other

## 2017-08-04 ENCOUNTER — Other Ambulatory Visit (HOSPITAL_COMMUNITY): Payer: Self-pay | Admitting: Emergency Medicine

## 2017-08-04 DIAGNOSIS — E785 Hyperlipidemia, unspecified: Secondary | ICD-10-CM | POA: Diagnosis not present

## 2017-08-04 DIAGNOSIS — I10 Essential (primary) hypertension: Secondary | ICD-10-CM | POA: Diagnosis not present

## 2017-08-04 DIAGNOSIS — Z7982 Long term (current) use of aspirin: Secondary | ICD-10-CM | POA: Diagnosis not present

## 2017-08-04 DIAGNOSIS — Z7984 Long term (current) use of oral hypoglycemic drugs: Secondary | ICD-10-CM | POA: Diagnosis not present

## 2017-08-04 DIAGNOSIS — K449 Diaphragmatic hernia without obstruction or gangrene: Secondary | ICD-10-CM | POA: Diagnosis not present

## 2017-08-04 DIAGNOSIS — C61 Malignant neoplasm of prostate: Secondary | ICD-10-CM

## 2017-08-04 DIAGNOSIS — F329 Major depressive disorder, single episode, unspecified: Secondary | ICD-10-CM | POA: Diagnosis not present

## 2017-08-04 DIAGNOSIS — K219 Gastro-esophageal reflux disease without esophagitis: Secondary | ICD-10-CM | POA: Diagnosis not present

## 2017-08-04 DIAGNOSIS — G4730 Sleep apnea, unspecified: Secondary | ICD-10-CM | POA: Diagnosis not present

## 2017-08-04 DIAGNOSIS — I714 Abdominal aortic aneurysm, without rupture: Secondary | ICD-10-CM | POA: Diagnosis not present

## 2017-08-04 DIAGNOSIS — Z951 Presence of aortocoronary bypass graft: Secondary | ICD-10-CM | POA: Diagnosis not present

## 2017-08-04 DIAGNOSIS — I2510 Atherosclerotic heart disease of native coronary artery without angina pectoris: Secondary | ICD-10-CM | POA: Diagnosis not present

## 2017-08-04 DIAGNOSIS — K5730 Diverticulosis of large intestine without perforation or abscess without bleeding: Secondary | ICD-10-CM | POA: Diagnosis not present

## 2017-08-04 DIAGNOSIS — Z87442 Personal history of urinary calculi: Secondary | ICD-10-CM | POA: Diagnosis not present

## 2017-08-04 DIAGNOSIS — D649 Anemia, unspecified: Secondary | ICD-10-CM | POA: Diagnosis not present

## 2017-08-04 DIAGNOSIS — E538 Deficiency of other specified B group vitamins: Secondary | ICD-10-CM | POA: Diagnosis not present

## 2017-08-04 DIAGNOSIS — Z87891 Personal history of nicotine dependence: Secondary | ICD-10-CM | POA: Diagnosis not present

## 2017-08-04 DIAGNOSIS — N179 Acute kidney failure, unspecified: Secondary | ICD-10-CM

## 2017-08-04 DIAGNOSIS — Z8546 Personal history of malignant neoplasm of prostate: Secondary | ICD-10-CM | POA: Diagnosis not present

## 2017-08-04 LAB — CBC WITH DIFFERENTIAL/PLATELET
BASOS ABS: 0 10*3/uL (ref 0.0–0.1)
BASOS PCT: 0 %
EOS ABS: 0.1 10*3/uL (ref 0.0–0.7)
EOS PCT: 3 %
HEMATOCRIT: 22.3 % — AB (ref 39.0–52.0)
Hemoglobin: 7.1 g/dL — ABNORMAL LOW (ref 13.0–17.0)
Lymphocytes Relative: 14 %
Lymphs Abs: 0.5 10*3/uL — ABNORMAL LOW (ref 0.7–4.0)
MCH: 31.4 pg (ref 26.0–34.0)
MCHC: 31.8 g/dL (ref 30.0–36.0)
MCV: 98.7 fL (ref 78.0–100.0)
MONO ABS: 0.3 10*3/uL (ref 0.1–1.0)
MONOS PCT: 7 %
NEUTROS ABS: 2.9 10*3/uL (ref 1.7–7.7)
Neutrophils Relative %: 76 %
PLATELETS: 120 10*3/uL — AB (ref 150–400)
RBC: 2.26 MIL/uL — ABNORMAL LOW (ref 4.22–5.81)
RDW: 20.4 % — AB (ref 11.5–15.5)
WBC: 3.8 10*3/uL — ABNORMAL LOW (ref 4.0–10.5)

## 2017-08-04 LAB — COMPREHENSIVE METABOLIC PANEL
ALK PHOS: 60 U/L (ref 38–126)
ALT: 19 U/L (ref 17–63)
AST: 24 U/L (ref 15–41)
Albumin: 2.8 g/dL — ABNORMAL LOW (ref 3.5–5.0)
Anion gap: 7 (ref 5–15)
BILIRUBIN TOTAL: 0.6 mg/dL (ref 0.3–1.2)
BUN: 26 mg/dL — ABNORMAL HIGH (ref 6–20)
CALCIUM: 8.6 mg/dL — AB (ref 8.9–10.3)
CO2: 24 mmol/L (ref 22–32)
CREATININE: 1.97 mg/dL — AB (ref 0.61–1.24)
Chloride: 107 mmol/L (ref 101–111)
GFR calc non Af Amer: 30 mL/min — ABNORMAL LOW (ref >60)
GFR, EST AFRICAN AMERICAN: 35 mL/min — AB (ref >60)
GLUCOSE: 288 mg/dL — AB (ref 65–99)
Potassium: 3.9 mmol/L (ref 3.5–5.1)
SODIUM: 138 mmol/L (ref 135–145)
Total Protein: 6 g/dL — ABNORMAL LOW (ref 6.5–8.1)

## 2017-08-04 LAB — PROTEIN / CREATININE RATIO, URINE
Creatinine, Urine: 163.6 mg/dL
Protein Creatinine Ratio: 2.31 mg/mg{Cre} — ABNORMAL HIGH (ref 0.00–0.15)
Total Protein, Urine: 378 mg/dL

## 2017-08-04 LAB — IRON AND TIBC
Iron: 121 ug/dL (ref 45–182)
Saturation Ratios: 52 % — ABNORMAL HIGH (ref 17.9–39.5)
TIBC: 232 ug/dL — AB (ref 250–450)
UIBC: 111 ug/dL

## 2017-08-04 LAB — PREPARE RBC (CROSSMATCH)

## 2017-08-04 LAB — FERRITIN: Ferritin: 320 ng/mL (ref 24–336)

## 2017-08-04 NOTE — Progress Notes (Signed)
hemoglobin 7.1.  Spoke with Dr Talbert Cage.  Transfusion 2 units of PRBCs.  Orders placed.  Pt can not come until thursday for transfusion.  Lab notified type and screen released and prepare.

## 2017-08-05 LAB — PTH, INTACT AND CALCIUM
Calcium, Total (PTH): 8.2 mg/dL — ABNORMAL LOW (ref 8.6–10.2)
PTH: 40 pg/mL (ref 15–65)

## 2017-08-05 LAB — VITAMIN D 25 HYDROXY (VIT D DEFICIENCY, FRACTURES): VIT D 25 HYDROXY: 24.9 ng/mL — AB (ref 30.0–100.0)

## 2017-08-06 ENCOUNTER — Other Ambulatory Visit (HOSPITAL_COMMUNITY): Payer: Self-pay | Admitting: Adult Health

## 2017-08-06 ENCOUNTER — Ambulatory Visit (HOSPITAL_COMMUNITY): Payer: Medicare Other

## 2017-08-06 ENCOUNTER — Ambulatory Visit (INDEPENDENT_AMBULATORY_CARE_PROVIDER_SITE_OTHER): Payer: Medicare Other | Admitting: Urology

## 2017-08-06 ENCOUNTER — Encounter (HOSPITAL_COMMUNITY): Payer: Medicare Other

## 2017-08-06 DIAGNOSIS — D649 Anemia, unspecified: Secondary | ICD-10-CM

## 2017-08-06 DIAGNOSIS — C61 Malignant neoplasm of prostate: Secondary | ICD-10-CM

## 2017-08-06 DIAGNOSIS — N250 Renal osteodystrophy: Secondary | ICD-10-CM | POA: Diagnosis not present

## 2017-08-06 DIAGNOSIS — E559 Vitamin D deficiency, unspecified: Secondary | ICD-10-CM

## 2017-08-06 DIAGNOSIS — R310 Gross hematuria: Secondary | ICD-10-CM

## 2017-08-06 DIAGNOSIS — N183 Chronic kidney disease, stage 3 (moderate): Secondary | ICD-10-CM | POA: Diagnosis not present

## 2017-08-06 DIAGNOSIS — R809 Proteinuria, unspecified: Secondary | ICD-10-CM | POA: Diagnosis not present

## 2017-08-06 MED ORDER — ERGOCALCIFEROL 1.25 MG (50000 UT) PO CAPS: 50000.0000 [IU] | ORAL_CAPSULE | ORAL | 0 refills | Status: DC

## 2017-08-07 ENCOUNTER — Encounter (HOSPITAL_BASED_OUTPATIENT_CLINIC_OR_DEPARTMENT_OTHER): Payer: Medicare Other

## 2017-08-07 ENCOUNTER — Encounter (HOSPITAL_COMMUNITY): Payer: Self-pay

## 2017-08-07 DIAGNOSIS — D649 Anemia, unspecified: Secondary | ICD-10-CM

## 2017-08-07 DIAGNOSIS — K219 Gastro-esophageal reflux disease without esophagitis: Secondary | ICD-10-CM | POA: Diagnosis not present

## 2017-08-07 DIAGNOSIS — E538 Deficiency of other specified B group vitamins: Secondary | ICD-10-CM | POA: Diagnosis not present

## 2017-08-07 DIAGNOSIS — I2510 Atherosclerotic heart disease of native coronary artery without angina pectoris: Secondary | ICD-10-CM | POA: Diagnosis not present

## 2017-08-07 DIAGNOSIS — Z8546 Personal history of malignant neoplasm of prostate: Secondary | ICD-10-CM | POA: Diagnosis not present

## 2017-08-07 DIAGNOSIS — F329 Major depressive disorder, single episode, unspecified: Secondary | ICD-10-CM | POA: Diagnosis not present

## 2017-08-07 MED ORDER — DIPHENHYDRAMINE HCL 25 MG PO CAPS
25.0000 mg | ORAL_CAPSULE | Freq: Once | ORAL | Status: AC
  Administered 2017-08-07: 25 mg via ORAL

## 2017-08-07 MED ORDER — SODIUM CHLORIDE 0.9 % IV SOLN
250.0000 mL | Freq: Once | INTRAVENOUS | Status: AC
  Administered 2017-08-07: 250 mL via INTRAVENOUS

## 2017-08-07 MED ORDER — ACETAMINOPHEN 325 MG PO TABS
650.0000 mg | ORAL_TABLET | Freq: Once | ORAL | Status: AC
  Administered 2017-08-07: 650 mg via ORAL

## 2017-08-07 MED ORDER — DIPHENHYDRAMINE HCL 25 MG PO CAPS
ORAL_CAPSULE | ORAL | Status: AC
  Filled 2017-08-07: qty 1

## 2017-08-07 MED ORDER — ACETAMINOPHEN 325 MG PO TABS
ORAL_TABLET | ORAL | Status: AC
  Filled 2017-08-07: qty 2

## 2017-08-07 NOTE — Progress Notes (Signed)
Tolerated infusion w/o adverse reaction.  Alert, in no distress.  VSS.  Discharged ambulatory in c/o spouse.  

## 2017-08-08 LAB — TYPE AND SCREEN
ABO/RH(D): B POS
ANTIBODY SCREEN: NEGATIVE
UNIT DIVISION: 0
Unit division: 0

## 2017-08-08 LAB — BPAM RBC
BLOOD PRODUCT EXPIRATION DATE: 201808242359
Blood Product Expiration Date: 201809042359
ISSUE DATE / TIME: 201808091033
ISSUE DATE / TIME: 201808090841
UNIT TYPE AND RH: 1700
UNIT TYPE AND RH: 5100

## 2017-08-11 ENCOUNTER — Encounter (HOSPITAL_COMMUNITY): Payer: Medicare Other

## 2017-08-11 ENCOUNTER — Other Ambulatory Visit: Payer: Self-pay | Admitting: Urology

## 2017-08-11 DIAGNOSIS — D649 Anemia, unspecified: Secondary | ICD-10-CM | POA: Diagnosis not present

## 2017-08-11 DIAGNOSIS — Z8546 Personal history of malignant neoplasm of prostate: Secondary | ICD-10-CM | POA: Diagnosis not present

## 2017-08-11 DIAGNOSIS — K219 Gastro-esophageal reflux disease without esophagitis: Secondary | ICD-10-CM | POA: Diagnosis not present

## 2017-08-11 DIAGNOSIS — E538 Deficiency of other specified B group vitamins: Secondary | ICD-10-CM | POA: Diagnosis not present

## 2017-08-11 DIAGNOSIS — I2510 Atherosclerotic heart disease of native coronary artery without angina pectoris: Secondary | ICD-10-CM | POA: Diagnosis not present

## 2017-08-11 DIAGNOSIS — C61 Malignant neoplasm of prostate: Secondary | ICD-10-CM

## 2017-08-11 DIAGNOSIS — F329 Major depressive disorder, single episode, unspecified: Secondary | ICD-10-CM | POA: Diagnosis not present

## 2017-08-11 LAB — CBC WITH DIFFERENTIAL/PLATELET
BASOS ABS: 0 10*3/uL (ref 0.0–0.1)
Basophils Relative: 0 %
EOS ABS: 0.2 10*3/uL (ref 0.0–0.7)
Eosinophils Relative: 4 %
HCT: 31.2 % — ABNORMAL LOW (ref 39.0–52.0)
Hemoglobin: 10.2 g/dL — ABNORMAL LOW (ref 13.0–17.0)
LYMPHS PCT: 14 %
Lymphs Abs: 0.6 10*3/uL — ABNORMAL LOW (ref 0.7–4.0)
MCH: 31.8 pg (ref 26.0–34.0)
MCHC: 32.7 g/dL (ref 30.0–36.0)
MCV: 97.2 fL (ref 78.0–100.0)
Monocytes Absolute: 0.2 10*3/uL (ref 0.1–1.0)
Monocytes Relative: 6 %
Neutro Abs: 3.1 10*3/uL (ref 1.7–7.7)
Neutrophils Relative %: 76 %
Platelets: 111 10*3/uL — ABNORMAL LOW (ref 150–400)
RBC: 3.21 MIL/uL — AB (ref 4.22–5.81)
RDW: 18.5 % — ABNORMAL HIGH (ref 11.5–15.5)
WBC: 4.1 10*3/uL (ref 4.0–10.5)

## 2017-08-11 LAB — IRON AND TIBC
IRON: 75 ug/dL (ref 45–182)
SATURATION RATIOS: 32 % (ref 17.9–39.5)
TIBC: 234 ug/dL — ABNORMAL LOW (ref 250–450)
UIBC: 159 ug/dL

## 2017-08-11 LAB — FERRITIN: Ferritin: 246 ng/mL (ref 24–336)

## 2017-08-12 ENCOUNTER — Other Ambulatory Visit: Payer: Self-pay | Admitting: Urology

## 2017-08-12 ENCOUNTER — Encounter (HOSPITAL_COMMUNITY): Payer: Self-pay

## 2017-08-12 ENCOUNTER — Encounter (HOSPITAL_COMMUNITY)
Admission: RE | Admit: 2017-08-12 | Discharge: 2017-08-12 | Disposition: A | Discharge status: Home / Self Care | Payer: Medicare Other | Source: Ambulatory Visit | Attending: Urology | Admitting: Urology

## 2017-08-13 ENCOUNTER — Encounter (HOSPITAL_COMMUNITY)
Admission: RE | Disposition: A | Discharge status: Home / Self Care | Payer: Self-pay | Source: Ambulatory Visit | Attending: Urology

## 2017-08-13 ENCOUNTER — Ambulatory Visit (HOSPITAL_COMMUNITY): Payer: Medicare Other | Admitting: Anesthesiology

## 2017-08-13 ENCOUNTER — Encounter (HOSPITAL_COMMUNITY): Payer: Self-pay

## 2017-08-13 ENCOUNTER — Ambulatory Visit (HOSPITAL_COMMUNITY)
Admission: RE | Admit: 2017-08-13 | Discharge: 2017-08-13 | Disposition: A | Discharge status: Home / Self Care | Payer: Medicare Other | Source: Ambulatory Visit | Attending: Urology | Admitting: Urology

## 2017-08-13 ENCOUNTER — Ambulatory Visit (HOSPITAL_COMMUNITY): Payer: Medicare Other

## 2017-08-13 DIAGNOSIS — I714 Abdominal aortic aneurysm, without rupture: Secondary | ICD-10-CM | POA: Diagnosis not present

## 2017-08-13 DIAGNOSIS — N131 Hydronephrosis with ureteral stricture, not elsewhere classified: Secondary | ICD-10-CM | POA: Diagnosis not present

## 2017-08-13 DIAGNOSIS — Z880 Allergy status to penicillin: Secondary | ICD-10-CM | POA: Diagnosis not present

## 2017-08-13 DIAGNOSIS — Z923 Personal history of irradiation: Secondary | ICD-10-CM | POA: Insufficient documentation

## 2017-08-13 DIAGNOSIS — Z951 Presence of aortocoronary bypass graft: Secondary | ICD-10-CM | POA: Insufficient documentation

## 2017-08-13 DIAGNOSIS — K219 Gastro-esophageal reflux disease without esophagitis: Secondary | ICD-10-CM | POA: Insufficient documentation

## 2017-08-13 DIAGNOSIS — Z87891 Personal history of nicotine dependence: Secondary | ICD-10-CM | POA: Diagnosis not present

## 2017-08-13 DIAGNOSIS — Z7984 Long term (current) use of oral hypoglycemic drugs: Secondary | ICD-10-CM | POA: Insufficient documentation

## 2017-08-13 DIAGNOSIS — G4730 Sleep apnea, unspecified: Secondary | ICD-10-CM | POA: Insufficient documentation

## 2017-08-13 DIAGNOSIS — Z87442 Personal history of urinary calculi: Secondary | ICD-10-CM | POA: Insufficient documentation

## 2017-08-13 DIAGNOSIS — N135 Crossing vessel and stricture of ureter without hydronephrosis: Secondary | ICD-10-CM | POA: Diagnosis not present

## 2017-08-13 DIAGNOSIS — R310 Gross hematuria: Secondary | ICD-10-CM | POA: Diagnosis not present

## 2017-08-13 DIAGNOSIS — F329 Major depressive disorder, single episode, unspecified: Secondary | ICD-10-CM | POA: Diagnosis not present

## 2017-08-13 DIAGNOSIS — E119 Type 2 diabetes mellitus without complications: Secondary | ICD-10-CM | POA: Diagnosis not present

## 2017-08-13 DIAGNOSIS — N132 Hydronephrosis with renal and ureteral calculous obstruction: Secondary | ICD-10-CM | POA: Diagnosis not present

## 2017-08-13 DIAGNOSIS — E785 Hyperlipidemia, unspecified: Secondary | ICD-10-CM | POA: Diagnosis not present

## 2017-08-13 DIAGNOSIS — Z8546 Personal history of malignant neoplasm of prostate: Secondary | ICD-10-CM | POA: Diagnosis not present

## 2017-08-13 DIAGNOSIS — Z79899 Other long term (current) drug therapy: Secondary | ICD-10-CM | POA: Diagnosis not present

## 2017-08-13 DIAGNOSIS — M1990 Unspecified osteoarthritis, unspecified site: Secondary | ICD-10-CM | POA: Insufficient documentation

## 2017-08-13 DIAGNOSIS — I2510 Atherosclerotic heart disease of native coronary artery without angina pectoris: Secondary | ICD-10-CM | POA: Insufficient documentation

## 2017-08-13 DIAGNOSIS — I10 Essential (primary) hypertension: Secondary | ICD-10-CM | POA: Diagnosis not present

## 2017-08-13 DIAGNOSIS — E538 Deficiency of other specified B group vitamins: Secondary | ICD-10-CM | POA: Diagnosis not present

## 2017-08-13 DIAGNOSIS — IMO0001 Reserved for inherently not codable concepts without codable children: Secondary | ICD-10-CM

## 2017-08-13 DIAGNOSIS — D649 Anemia, unspecified: Secondary | ICD-10-CM | POA: Insufficient documentation

## 2017-08-13 HISTORY — PX: CYSTOSCOPY WITH RETROGRADE PYELOGRAM, URETEROSCOPY AND STENT PLACEMENT: SHX5789

## 2017-08-13 HISTORY — PX: HOLMIUM LASER APPLICATION: SHX5852

## 2017-08-13 HISTORY — PX: CYSTOSCOPY WITH FULGERATION: SHX6638

## 2017-08-13 LAB — GLUCOSE, CAPILLARY
GLUCOSE-CAPILLARY: 160 mg/dL — AB (ref 65–99)
Glucose-Capillary: 136 mg/dL — ABNORMAL HIGH (ref 65–99)

## 2017-08-13 SURGERY — CYSTOURETEROSCOPY, WITH RETROGRADE PYELOGRAM AND STENT INSERTION
Anesthesia: General | Site: Bladder | Laterality: Left

## 2017-08-13 MED ORDER — FENTANYL CITRATE (PF) 100 MCG/2ML IJ SOLN: 25.0000 ug | INTRAMUSCULAR | Status: DC | PRN

## 2017-08-13 MED ORDER — MIDAZOLAM HCL 2 MG/2ML IJ SOLN
1.0000 mg | INTRAMUSCULAR | Status: AC
  Administered 2017-08-13: 2 mg via INTRAVENOUS
  Filled 2017-08-13: qty 2

## 2017-08-13 MED ORDER — ATROPINE SULFATE 1 MG/ML IJ SOLN
INTRAMUSCULAR | Status: AC
  Filled 2017-08-13: qty 1

## 2017-08-13 MED ORDER — LACTATED RINGERS IV SOLN
INTRAVENOUS | Status: DC
  Administered 2017-08-13: 12:00:00 via INTRAVENOUS

## 2017-08-13 MED ORDER — TRAMADOL HCL 50 MG PO TABS: 50.0000 mg | ORAL_TABLET | Freq: Four times a day (QID) | ORAL | 0 refills | Status: DC | PRN

## 2017-08-13 MED ORDER — PROPOFOL 10 MG/ML IV BOLUS
INTRAVENOUS | Status: AC
  Filled 2017-08-13: qty 20

## 2017-08-13 MED ORDER — FENTANYL CITRATE (PF) 100 MCG/2ML IJ SOLN
INTRAMUSCULAR | Status: DC | PRN
  Administered 2017-08-13: 100 ug via INTRAVENOUS
  Administered 2017-08-13: 25 ug via INTRAVENOUS

## 2017-08-13 MED ORDER — EPHEDRINE SULFATE 50 MG/ML IJ SOLN
INTRAMUSCULAR | Status: DC | PRN
  Administered 2017-08-13 (×3): 5 mg via INTRAVENOUS

## 2017-08-13 MED ORDER — EPHEDRINE SULFATE 50 MG/ML IJ SOLN
INTRAMUSCULAR | Status: AC
  Filled 2017-08-13: qty 1

## 2017-08-13 MED ORDER — DIATRIZOATE MEGLUMINE 30 % UR SOLN
URETHRAL | Status: AC
  Filled 2017-08-13: qty 300

## 2017-08-13 MED ORDER — DEXTROSE 5 % IV SOLN
2.0000 g | INTRAVENOUS | Status: AC
  Administered 2017-08-13: 2 g via INTRAVENOUS
  Filled 2017-08-13: qty 2

## 2017-08-13 MED ORDER — PROPOFOL 10 MG/ML IV BOLUS
INTRAVENOUS | Status: DC | PRN
  Administered 2017-08-13 (×3): 50 mg via INTRAVENOUS

## 2017-08-13 MED ORDER — SODIUM CHLORIDE 0.9 % IR SOLN
Status: DC | PRN
  Administered 2017-08-13: 3000 mL

## 2017-08-13 MED ORDER — FENTANYL CITRATE (PF) 100 MCG/2ML IJ SOLN
INTRAMUSCULAR | Status: AC
  Filled 2017-08-13: qty 2

## 2017-08-13 MED ORDER — SODIUM CHLORIDE 0.9 % IJ SOLN
INTRAMUSCULAR | Status: AC
  Filled 2017-08-13: qty 10

## 2017-08-13 MED ORDER — WATER FOR IRRIGATION, STERILE IR SOLN
Status: DC | PRN
  Administered 2017-08-13: 3000 mL via INTRAVESICAL

## 2017-08-13 MED ORDER — DIATRIZOATE MEGLUMINE 30 % UR SOLN
URETHRAL | Status: DC | PRN
  Administered 2017-08-13: 10 mL via URETHRAL

## 2017-08-13 SURGICAL SUPPLY — 26 items
BAG DRAIN URO TABLE W/ADPT NS (DRAPE) ×4 IMPLANT
BAG HAMPER (MISCELLANEOUS) ×4 IMPLANT
CATH INTERMIT  6FR 70CM (CATHETERS) IMPLANT
CLOTH BEACON ORANGE TIMEOUT ST (SAFETY) ×4 IMPLANT
DECANTER SPIKE VIAL GLASS SM (MISCELLANEOUS) ×4 IMPLANT
FIBER LASER FLEXIVA 200 (UROLOGICAL SUPPLIES) ×4 IMPLANT
GLOVE BIO SURGEON STRL SZ7.5 (GLOVE) ×4 IMPLANT
GLOVE BIO SURGEON STRL SZ8 (GLOVE) ×4 IMPLANT
GLOVE BIOGEL PI IND STRL 7.0 (GLOVE) ×2 IMPLANT
GLOVE BIOGEL PI INDICATOR 7.0 (GLOVE) ×2
GOWN STRL REUS W/ TWL LRG LVL3 (GOWN DISPOSABLE) ×4 IMPLANT
GOWN STRL REUS W/ TWL XL LVL3 (GOWN DISPOSABLE) ×2 IMPLANT
GOWN STRL REUS W/TWL LRG LVL3 (GOWN DISPOSABLE) ×8 IMPLANT
GOWN STRL REUS W/TWL XL LVL3 (GOWN DISPOSABLE) ×2
GUIDEWIRE ANG ZIPWIRE 038X150 (WIRE) ×4 IMPLANT
GUIDEWIRE STR DUAL SENSOR (WIRE) ×4 IMPLANT
GUIDEWIRE STR ZIPWIRE 035X150 (MISCELLANEOUS) ×4 IMPLANT
IV NS IRRIG 3000ML ARTHROMATIC (IV SOLUTION) ×4 IMPLANT
KIT ROOM TURNOVER AP CYSTO (KITS) ×4 IMPLANT
MANIFOLD NEPTUNE II (INSTRUMENTS) ×4 IMPLANT
PACK CYSTO (CUSTOM PROCEDURE TRAY) ×4 IMPLANT
PAD ARMBOARD 7.5X6 YLW CONV (MISCELLANEOUS) ×4 IMPLANT
STENT CONTOUR 7FRX26 (STENTS) ×4 IMPLANT
SYRINGE 10CC LL (SYRINGE) ×4 IMPLANT
WATER STERILE IRR 3000ML UROMA (IV SOLUTION) ×4 IMPLANT
WATER STERILE IRR 500ML POUR (IV SOLUTION) ×4 IMPLANT

## 2017-08-13 NOTE — Discharge Instructions (Signed)

## 2017-08-13 NOTE — Transfer of Care (Signed)
Immediate Anesthesia Transfer of Care Note  Patient: Andrew Zhang  Procedure(s) Performed: Procedure(s): CYSTOSCOPY WITH LEFT RETROGRADE PYELOGRAM, LEFT URETEROSCOPY AND STENT REPLACEMENT (Left) CYSTOSCOPY WITH FULGERATION OF BLADDER HOLMIUM LASER LITHOTRIPSY OF ENCRUSTED LEFT URETERAL STENT (Left)  Patient Location: PACU  Anesthesia Type:General  Level of Consciousness: awake, alert  and oriented  Airway & Oxygen Therapy: Patient Spontanous Breathing and Patient connected to nasal cannula oxygen  Post-op Assessment: Report given to RN and Post -op Vital signs reviewed and stable  Post vital signs: Reviewed and stable  Last Vitals:  Vitals:   08/13/17 1225 08/13/17 1230  BP: 138/71 (!) 147/76  Pulse:    Resp: (!) 25 19  Temp:    SpO2: 96% 97%    Last Pain:  Vitals:   08/13/17 1102  TempSrc: Oral         Complications: No apparent anesthesia complications

## 2017-08-13 NOTE — Anesthesia Postprocedure Evaluation (Signed)
Anesthesia Post Note  Patient: Andrew Zhang  Procedure(s) Performed: Procedure(s) (LRB): CYSTOSCOPY WITH LEFT RETROGRADE PYELOGRAM, LEFT URETEROSCOPY AND STENT REPLACEMENT (Left) CYSTOSCOPY WITH FULGERATION OF BLADDER HOLMIUM LASER LITHOTRIPSY OF ENCRUSTED LEFT URETERAL STENT (Left)  Patient location during evaluation: PACU Anesthesia Type: General Level of consciousness: awake and patient cooperative Pain management: pain level controlled Vital Signs Assessment: post-procedure vital signs reviewed and stable Respiratory status: spontaneous breathing, nonlabored ventilation and respiratory function stable Cardiovascular status: blood pressure returned to baseline Postop Assessment: no signs of nausea or vomiting Anesthetic complications: no     Last Vitals:  Vitals:   08/13/17 1230 08/13/17 1350  BP: (!) 147/76 (!) 177/80  Pulse:  100  Resp: 19   Temp:  36.5 C  SpO2: 97% 95%    Last Pain:  Vitals:   08/13/17 1102  TempSrc: Oral                 Marisah Laker J

## 2017-08-13 NOTE — H&P (Signed)
Urology Admission H&P  Chief Complaint: gross heamturia  History of Present Illness: Andrew Zhang is a 83yo with gross hematuria from his left ureter.   Past Medical History:  Diagnosis Date  . AAA (abdominal aortic aneurysm) ([place])   . Anemia   . Arthritis   . B12 deficiency   . Blood transfusion without reported diagnosis   . CAD (coronary artery disease)   . Cancer [facility])    radiation for prostate cancer and lupron.  . Cataract   . Complication of anesthesia    sts,"after hearat surgery my stomach didnt wake up like it was supossed to".  . Depression   . Diverticulitis   . DMII (diabetes mellitus, type 2) ([place]) 2008  . GERD (gastroesophageal reflux disease)   . GI bleed   . GIB (gastrointestinal bleeding) 09/18/2016  . Hiatal hernia   . History of kidney stones   . HTN (hypertension)   . Hyperlipidemia   . Iron deficiency anemia due to chronic blood loss 01/08/2017  . Kidney stone   . PONV (postoperative nausea and vomiting)   . Prostate cancer ([place])   . Sleep apnea    cannot tolerate, PCP aware  . Ureteral stricture, left    Past Surgical History:  Procedure Laterality Date  . APPENDECTOMY    . BACK SURGERY     x2  . CATARACT EXTRACTION Left   . CHOLECYSTECTOMY    . COLONOSCOPY  03/2015   Dr. Britta Mccreedy: Diverticulosis, single tubular adenoma removed.  . COLONOSCOPY WITH PROPOFOL N/A 02/25/2017   Dr. Oneida Alar: non-thrombosed external hemorrhoids, significant looping of left colon. severe diverticulosis in recto-sigmoid colon and sigmoid colon. 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 CORONARY ARTERY BYPASS GRAFT     1995  . CYSTOSCOPY     with stent-left kidney. x2  . CYSTOSCOPY W/ URETERAL STENT PLACEMENT Left 05/14/2017   Procedure: CYSTOSCOPY WITH RETROGRADE PYELOGRAM/URETERAL STENT EXCHANGE;  Surgeon: Cleon Gustin, MD;  Location: [facility];  Service: Urology;   Laterality: Left;  . DESCENDING AORTIC ANEURYSM REPAIR W/ STENT     x2  . ESOPHAGOGASTRODUODENOSCOPY N/A 07/30/2016   Dr. Oneida Alar. Nonobstructing Schatzki ring at GE junction, multiple small sessile polyps with no bleeding or stigmata of recent bleeding in the gastric fundus and gastric body. Benign-appearing intrinsic moderate stenosis found the pylorus status post dilation. Small bowel capsule deployed.  Marland Kitchen GIVENS CAPSULE STUDY     Capsule study is complete to the cecum. No obvious lesions, mass, tumors. Small wisps of blood noted in small bowel secondary to EGD/dilation. Possible few erosions in setting of NSAIDs. No obvious areas of bleeding.  Marland Kitchen HERNIA REPAIR    . SPINE SURGERY     ruptured discs    Home Medications:  Prescriptions Prior to Admission  Medication Sig Dispense Refill Last Dose  . Cyanocobalamin 1000 MCG/ML LIQD Place 1 mL under the tongue daily.    08/12/2017 at Unknown time  . ergocalciferol (VITAMIN D2) 50000 units capsule Take 1 capsule (50,000 Units total) by mouth once a week. 8 capsule 0 08/12/2017 at Unknown time  . finasteride (PROSCAR) 5 MG tablet Take 5 mg by mouth daily.   08/12/2017 at Unknown time  . glimepiride (AMARYL) 2 MG tablet Take 2 mg by mouth 2 (two) times daily.    08/12/2017 at Unknown time  . nitroGLYCERIN (NITROSTAT) 0.4 MG SL tablet Place 1 tablet (0.4 mg total) under the tongue  every 5 (five) minutes as needed for chest pain. 25 tablet 3 Taking  . omeprazole (PRILOSEC) 20 MG capsule Take 1 capsule (20 mg total) by mouth 2 (two) times daily before a meal. (Patient taking differently: Take 20 mg by mouth daily. ) 60 capsule 0 08/12/2017 at Unknown time  . sertraline (ZOLOFT) 100 MG tablet Take 50 mg by mouth daily.   08/12/2017 at Unknown time  . simvastatin (ZOCOR) 80 MG tablet Take 80 mg by mouth at bedtime.   08/12/2017 at Unknown time  . tamsulosin (FLOMAX) 0.4 MG CAPS Take 0.4 mg by mouth daily after supper.   08/12/2017 at Unknown time   Allergies:   Allergies  Allergen Reactions  . Penicillins Rash    Happened in [phone] Has patient had a PCN reaction causing immediate rash, facial/tongue/throat swelling, SOB or lightheaddness with hypotension: Yes  Has patient had a PCN reaction causing severe rash involving mucus membranes or skin necrosis: Unknown Has patient had a PCN reaction that required hospitalization: No Has patient had a PCN reaction occurring within the last 10 years: No If all of the above answers are "NO", then may proceed with Cephalosporin use.    Family History  Problem Relation Age of Onset  . Heart disease Unknown   . Diabetes Unknown   . Hypertension Unknown   . Liver cancer Mother   . Arthritis Mother   . Cancer Mother        died at 43  . Heart disease Mother   . Alzheimer's disease Father   . Cancer Brother        lung  . Cancer Son 32       lung  . Hepatitis Brother   . Alcohol abuse Brother   . Heart disease Son 18       heart attack, stents  . Diabetes Son   . Colon cancer Neg Hx    Social History:  reports that he quit smoking about 23 years ago. His smoking use included Cigarettes. He started smoking about 66 years ago. He has a 135.00 pack-year smoking history. He has never used smokeless tobacco. He reports that he drinks alcohol. He reports that he does not use drugs.  Review of Systems  Genitourinary: Positive for hematuria.  All other systems reviewed and are negative.   Physical Exam:  Vital signs in last 24 hours: Temp:  [97.9 F (36.6 C)] 97.9 F (36.6 C) (08/15 1102) Pulse Rate:  [69] 69 (08/15 1102) Resp:  [16-26] 26 (08/15 1200) BP: (140-169)/(68-74) 140/68 (08/15 1200) SpO2:  [92 %-97 %] 92 % (08/15 1200) Physical Exam  Constitutional: He is oriented to person, place, and time. He appears well-developed and well-nourished.  HENT:  Head: Normocephalic and atraumatic.  Eyes: Pupils are equal, round, and reactive to light. EOM are normal.  Neck: Normal range of  motion. No thyromegaly present.  Cardiovascular: Normal rate and regular rhythm.   Respiratory: Effort normal. No respiratory distress.  GI: Soft. He exhibits no distension.  Musculoskeletal: Normal range of motion. He exhibits no edema.  Neurological: He is alert and oriented to person, place, and time.  Skin: Skin is warm and dry.  Psychiatric: He has a normal mood and affect. His behavior is normal. Judgment and thought content normal.    Laboratory Data:  Results for orders placed or performed during the hospital encounter of 08/13/17 (from the past 24 hour(s))  Glucose, capillary     Status: Abnormal   Collection Time:  08/13/17 10:57 AM  Result Value Ref Range   Glucose-Capillary 160 (H) 65 - 99 mg/dL   No results found for this or any previous visit (from the past 240 hour(s)). Creatinine: No results for input(s): CREATININE in the last 168 hours. Baseline Creatinine: unknwon  Impression/Assessment:  83yo with gorss hematuria from left ureter  Plan:  The risks/ebenfits/alternatives to left diagnosic ureteroscopy was explained to the patient and he understands and wishes to proceed with surgery  Nicolette Bang 08/13/2017, 12:32 PM

## 2017-08-13 NOTE — Anesthesia Preprocedure Evaluation (Signed)
Anesthesia Evaluation  Patient identified by MRN, date of birth, ID band Patient awake    Reviewed: Allergy & Precautions, NPO status , Patient's Chart, lab work & pertinent test results  History of Anesthesia Complications (+) PONV and history of anesthetic complications  Airway Mallampati: I  TM Distance: >3 FB     Dental  (+) Edentulous Upper, Partial Lower   Pulmonary sleep apnea , former smoker,    breath sounds clear to auscultation       Cardiovascular hypertension, Pt. on medications + CAD, + CABG and + Peripheral Vascular Disease   Rhythm:Regular Rate:Normal     Neuro/Psych PSYCHIATRIC DISORDERS Depression  Neuromuscular disease    GI/Hepatic hiatal hernia, GERD  Controlled and Medicated,  Endo/Other  diabetes, Type 2  Renal/GU Renal disease     Musculoskeletal  (+) Arthritis ,   Abdominal   Peds  Hematology  (+) anemia ,   Anesthesia Other Findings   Reproductive/Obstetrics                             Anesthesia Physical Anesthesia Plan  ASA: III  Anesthesia Plan: General   Post-op Pain Management:    Induction: Intravenous  PONV Risk Score and Plan:   Airway Management Planned: LMA  Additional Equipment:   Intra-op Plan:   Post-operative Plan: Extubation in OR  Informed Consent: I have reviewed the patients History and Physical, chart, labs and discussed the procedure including the risks, benefits and alternatives for the proposed anesthesia with the patient or authorized representative who has indicated his/her understanding and acceptance.     Plan Discussed with:   Anesthesia Plan Comments:         Anesthesia Quick Evaluation

## 2017-08-13 NOTE — Anesthesia Procedure Notes (Signed)
Procedure Name: LMA Insertion Date/Time: 08/13/2017 12:49 PM Performed by: Jonna Munro Pre-anesthesia Checklist: Patient identified, Emergency Drugs available, Suction available, Patient being monitored and Timeout performed Patient Re-evaluated:Patient Re-evaluated prior to induction Oxygen Delivery Method: Circle system utilized Preoxygenation: Pre-oxygenation with 100% oxygen Induction Type: IV induction LMA: LMA inserted LMA Size: 5.0 Number of attempts: 1 Placement Confirmation: positive ETCO2 and breath sounds checked- equal and bilateral Tube secured with: Tape Dental Injury: Teeth and Oropharynx as per pre-operative assessment

## 2017-08-13 NOTE — Brief Op Note (Signed)
08/13/2017  1:42 PM  PATIENT:  Andrew Zhang  83 y.o. male  PRE-OPERATIVE DIAGNOSIS:  LEFT URETERAL STRICTURE  POST-OPERATIVE DIAGNOSIS:  LEFT URETERAL STRICTURE  PROCEDURE:  Procedure(s): CYSTOSCOPY WITH LEFT RETROGRADE PYELOGRAM, LEFT URETEROSCOPY AND STENT REPLACEMENT WITH POSSIBLE BALLOON DILATION (Left) CYSTOSCOPY WITH FULGERATION, Laser  SURGEON:  Surgeon(s) and Role:    * Anetha Slagel, Candee Furbish, MD - Primary  PHYSICIAN ASSISTANT:   ASSISTANTS: none   ANESTHESIA:   general  EBL:  Total I/O In: 900 [I.V.:900] Out: 10 [Blood:10]  BLOOD ADMINISTERED:none  DRAINS: left 7x26 JJ ureteral stent   LOCAL MEDICATIONS USED:  NONE  SPECIMEN:  No Specimen  DISPOSITION OF SPECIMEN:  N/A  COUNTS:  YES  TOURNIQUET:  * No tourniquets in log *  DICTATION: .Note written in EPIC  PLAN OF CARE: Discharge to home after PACU  PATIENT DISPOSITION:  PACU - hemodynamically stable.   Delay start of Pharmacological VTE agent (>24hrs) due to surgical blood loss or risk of bleeding: not applicable

## 2017-08-14 ENCOUNTER — Encounter (HOSPITAL_COMMUNITY): Payer: Self-pay | Admitting: Urology

## 2017-08-18 ENCOUNTER — Encounter (HOSPITAL_COMMUNITY): Payer: Medicare Other

## 2017-08-18 DIAGNOSIS — Z8546 Personal history of malignant neoplasm of prostate: Secondary | ICD-10-CM | POA: Diagnosis not present

## 2017-08-18 DIAGNOSIS — D649 Anemia, unspecified: Secondary | ICD-10-CM | POA: Diagnosis not present

## 2017-08-18 DIAGNOSIS — I2510 Atherosclerotic heart disease of native coronary artery without angina pectoris: Secondary | ICD-10-CM | POA: Diagnosis not present

## 2017-08-18 DIAGNOSIS — F329 Major depressive disorder, single episode, unspecified: Secondary | ICD-10-CM | POA: Diagnosis not present

## 2017-08-18 DIAGNOSIS — C61 Malignant neoplasm of prostate: Secondary | ICD-10-CM

## 2017-08-18 DIAGNOSIS — K219 Gastro-esophageal reflux disease without esophagitis: Secondary | ICD-10-CM | POA: Diagnosis not present

## 2017-08-18 DIAGNOSIS — E538 Deficiency of other specified B group vitamins: Secondary | ICD-10-CM | POA: Diagnosis not present

## 2017-08-18 LAB — IRON AND TIBC
IRON: 65 ug/dL (ref 45–182)
Saturation Ratios: 28 % (ref 17.9–39.5)
TIBC: 228 ug/dL — ABNORMAL LOW (ref 250–450)
UIBC: 163 ug/dL

## 2017-08-18 LAB — CBC WITH DIFFERENTIAL/PLATELET
Basophils Absolute: 0 10*3/uL (ref 0.0–0.1)
Basophils Relative: 0 %
EOS ABS: 0.1 10*3/uL (ref 0.0–0.7)
EOS PCT: 2 %
HCT: 32.7 % — ABNORMAL LOW (ref 39.0–52.0)
Hemoglobin: 10.7 g/dL — ABNORMAL LOW (ref 13.0–17.0)
LYMPHS ABS: 0.8 10*3/uL (ref 0.7–4.0)
Lymphocytes Relative: 21 %
MCH: 31.7 pg (ref 26.0–34.0)
MCHC: 32.7 g/dL (ref 30.0–36.0)
MCV: 96.7 fL (ref 78.0–100.0)
MONOS PCT: 6 %
Monocytes Absolute: 0.2 10*3/uL (ref 0.1–1.0)
Neutro Abs: 2.6 10*3/uL (ref 1.7–7.7)
Neutrophils Relative %: 71 %
Platelets: 139 10*3/uL — ABNORMAL LOW (ref 150–400)
RBC: 3.38 MIL/uL — ABNORMAL LOW (ref 4.22–5.81)
RDW: 16.8 % — AB (ref 11.5–15.5)
WBC: 3.7 10*3/uL — ABNORMAL LOW (ref 4.0–10.5)

## 2017-08-18 LAB — FERRITIN: FERRITIN: 218 ng/mL (ref 24–336)

## 2017-08-25 ENCOUNTER — Encounter (HOSPITAL_COMMUNITY): Payer: Medicare Other

## 2017-08-25 ENCOUNTER — Ambulatory Visit (HOSPITAL_COMMUNITY): Payer: Medicare Other

## 2017-08-25 ENCOUNTER — Other Ambulatory Visit (HOSPITAL_COMMUNITY): Payer: Medicare Other

## 2017-08-25 DIAGNOSIS — D649 Anemia, unspecified: Secondary | ICD-10-CM | POA: Diagnosis not present

## 2017-08-25 DIAGNOSIS — Z8546 Personal history of malignant neoplasm of prostate: Secondary | ICD-10-CM | POA: Diagnosis not present

## 2017-08-25 DIAGNOSIS — K219 Gastro-esophageal reflux disease without esophagitis: Secondary | ICD-10-CM | POA: Diagnosis not present

## 2017-08-25 DIAGNOSIS — I2510 Atherosclerotic heart disease of native coronary artery without angina pectoris: Secondary | ICD-10-CM | POA: Diagnosis not present

## 2017-08-25 DIAGNOSIS — C61 Malignant neoplasm of prostate: Secondary | ICD-10-CM

## 2017-08-25 DIAGNOSIS — E538 Deficiency of other specified B group vitamins: Secondary | ICD-10-CM | POA: Diagnosis not present

## 2017-08-25 DIAGNOSIS — F329 Major depressive disorder, single episode, unspecified: Secondary | ICD-10-CM | POA: Diagnosis not present

## 2017-08-25 LAB — CBC WITH DIFFERENTIAL/PLATELET
BASOS ABS: 0 10*3/uL (ref 0.0–0.1)
BASOS PCT: 0 %
Eosinophils Absolute: 0.2 10*3/uL (ref 0.0–0.7)
Eosinophils Relative: 3 %
HEMATOCRIT: 32.4 % — AB (ref 39.0–52.0)
HEMOGLOBIN: 10.8 g/dL — AB (ref 13.0–17.0)
LYMPHS PCT: 16 %
Lymphs Abs: 0.9 10*3/uL (ref 0.7–4.0)
MCH: 32.2 pg (ref 26.0–34.0)
MCHC: 33.3 g/dL (ref 30.0–36.0)
MCV: 96.7 fL (ref 78.0–100.0)
MONO ABS: 0.3 10*3/uL (ref 0.1–1.0)
Monocytes Relative: 5 %
NEUTROS ABS: 4.3 10*3/uL (ref 1.7–7.7)
Neutrophils Relative %: 76 %
Platelets: 142 10*3/uL — ABNORMAL LOW (ref 150–400)
RBC: 3.35 MIL/uL — AB (ref 4.22–5.81)
RDW: 15.9 % — AB (ref 11.5–15.5)
WBC: 5.6 10*3/uL (ref 4.0–10.5)

## 2017-08-25 LAB — IRON AND TIBC
Iron: 60 ug/dL (ref 45–182)
SATURATION RATIOS: 25 % (ref 17.9–39.5)
TIBC: 241 ug/dL — AB (ref 250–450)
UIBC: 181 ug/dL

## 2017-08-25 LAB — FERRITIN: FERRITIN: 163 ng/mL (ref 24–336)

## 2017-08-25 NOTE — Op Note (Signed)
.  Preoperative diagnosis: gross hematuria  Postoperative diagnosis: encrusted left ureteral stent  Procedure: 1 cystoscopy 2. Left retrograde pyelography 3.  Intraoperative fluoroscopy, under one hour, with interpretation 4.  Left ureteroscopic stone manipulation with laser lithotripsy 5.  Left 7 x 26 JJ stent exchange  Attending: Rosie Fate  Anesthesia: General  Estimated blood loss: None  Drains: Left 7 x 26 JJ ureteral stent without tether  Specimens: stone for analysis  Antibiotics: ancef  Findings: severe left hydronephrosis. Encrusted left ureteral stent. No masses/lesions in the bladder. Ureteral orifices in normal anatomic location.  Indications: Patient is a 83 year old male with a history of gross heamturia with a stent in place and a left mid ureteral stricture.   After discussing treatment options, she decided proceed with left diagnostic ureteroscopy.  Procedure her in detail: The patient was brought to the operating room and a brief timeout was done to ensure correct patient, correct procedure, correct site.  General anesthesia was administered patient was placed in dorsal lithotomy position.  Her genitalia was then prepped and draped in usual sterile fashion.  A rigid 35 French cystoscope was passed in the urethra and the bladder.  Bladder was inspected free masses or lesions.  the ureteral orifices were in the normal orthotopic locations.  a 6 french ureteral catheter was then instilled into the left ureteral orifice.  a gentle retrograde was obtained and findings noted above.  we then placed a zip wire through the ureteral catheter and advanced up to the renal pelvis.  we then removed the cystoscope and cannulated the left ureteral orifice with a semirigid ureteroscope.  We noted multiple stones growing on the ureteral stwetn which did not allow for removal of the stent. Using a 200nm laser fiber the stone was fragmented off the stent and the stent was then removed.    the pieces were then removed with a Ngage basket.    once all stone fragments were removed we then placed a 7 x 26 double-j ureteral stent over the original zip wire.  We then removed the wire and good coil was noted in the the renal pelvis under fluoroscopy and the bladder under direct vision. the bladder was then drained and this concluded the procedure which was well tolerated by patient.  Complications: None  Condition: Stable, extubated, transferred to PACU  Plan: Patient is to be discharged home as to follow-up in 4 weeks with a renal US.

## 2017-08-27 ENCOUNTER — Ambulatory Visit (INDEPENDENT_AMBULATORY_CARE_PROVIDER_SITE_OTHER): Payer: Medicare Other | Admitting: Urology

## 2017-08-27 DIAGNOSIS — R310 Gross hematuria: Secondary | ICD-10-CM | POA: Diagnosis not present

## 2017-08-28 ENCOUNTER — Ambulatory Visit (INDEPENDENT_AMBULATORY_CARE_PROVIDER_SITE_OTHER): Payer: Medicare Other | Admitting: Family Medicine

## 2017-08-28 ENCOUNTER — Encounter: Payer: Self-pay | Admitting: Family Medicine

## 2017-08-28 VITALS — BP 124/60 | HR 72 | Temp 97.8°F | Resp 16 | Ht 74.0 in | Wt 196.0 lb

## 2017-08-28 DIAGNOSIS — E119 Type 2 diabetes mellitus without complications: Secondary | ICD-10-CM

## 2017-08-28 DIAGNOSIS — I25708 Atherosclerosis of coronary artery bypass graft(s), unspecified, with other forms of angina pectoris: Secondary | ICD-10-CM | POA: Diagnosis not present

## 2017-08-28 DIAGNOSIS — I10 Essential (primary) hypertension: Secondary | ICD-10-CM

## 2017-08-28 DIAGNOSIS — E785 Hyperlipidemia, unspecified: Secondary | ICD-10-CM | POA: Diagnosis not present

## 2017-08-28 NOTE — Patient Instructions (Signed)
You are doing well BP is great No change in medicines Don't forget the flu shot  See me in 6 months for a PE

## 2017-08-28 NOTE — Progress Notes (Signed)
Chief Complaint  Patient presents with  . Follow-up   Feels well He went for his uro procedure and the hematuria stopped Compliant with medicine Does not check his sugar Compliant with medicine Goes to [facility] regularly so care a little fragmented BP is good Refuses flu shot today - will get at [facility] Anemia is stable on last test  Patient Active Problem List   Diagnosis Date Noted  . Acute blood loss anemia   . Iron deficiency anemia due to chronic blood loss 01/08/2017  . Melena   . Acute on chronic renal failure ([place]) 07/29/2016  . Adenocarcinoma of prostate ([place]) 09/06/2014  . Type 2 diabetes mellitus without complications ([place]) 84/13/2440  . CAD (coronary artery disease) of artery bypass graft 08/27/2013  . HTN (hypertension) 08/27/2013  . Hyperlipidemia 08/27/2013  . S/P CABG x 3 08/27/2013  . Abdominal aortic aneurysm ([place]) 04/22/2013    Outpatient Encounter Prescriptions as of 08/28/2017  Medication Sig  . cholecalciferol (VITAMIN D) 1000 units tablet Take 2,000 Units by mouth daily.  . Cyanocobalamin 1000 MCG/ML LIQD Place 1 mL under the tongue daily.   . finasteride (PROSCAR) 5 MG tablet Take 5 mg by mouth daily.  Marland Kitchen glimepiride (AMARYL) 2 MG tablet Take 2 mg by mouth 2 (two) times daily.   . nitroGLYCERIN (NITROSTAT) 0.4 MG SL tablet Place 1 tablet (0.4 mg total) under the tongue every 5 (five) minutes as needed for chest pain.  Marland Kitchen omeprazole (PRILOSEC) 20 MG capsule Take 1 capsule (20 mg total) by mouth 2 (two) times daily before a meal. (Patient taking differently: Take 20 mg by mouth daily. )  . sertraline (ZOLOFT) 100 MG tablet Take 50 mg by mouth daily.  . simvastatin (ZOCOR) 80 MG tablet Take 80 mg by mouth at bedtime.  . tamsulosin (FLOMAX) 0.4 MG CAPS Take 0.4 mg by mouth daily after supper.  . traMADol (ULTRAM) 50 MG tablet Take 1 tablet (50 mg total) by mouth every 6 (six) hours as needed.  . [DISCONTINUED] ergocalciferol (VITAMIN D2) 50000 units capsule Take  1 capsule (50,000 Units total) by mouth once a week. (Patient not taking: Reported on 08/28/2017)   No facility-administered encounter medications on file as of 08/28/2017.     Allergies  Allergen Reactions  . Penicillins Rash    Happened in [phone] Has patient had a PCN reaction causing immediate rash, facial/tongue/throat swelling, SOB or lightheaddness with hypotension: Yes  Has patient had a PCN reaction causing severe rash involving mucus membranes or skin necrosis: Unknown Has patient had a PCN reaction that required hospitalization: No Has patient had a PCN reaction occurring within the last 10 years: No If all of the above answers are "NO", then may proceed with Cephalosporin use.    Review of Systems  Constitutional: Positive for unexpected weight change. Negative for activity change, appetite change and fatigue.  HENT: Negative for congestion, dental problem and hearing loss.   Eyes: Negative for photophobia and visual disturbance.  Respiratory: Negative for cough and shortness of breath.   Cardiovascular: Negative for chest pain, palpitations and leg swelling.  Gastrointestinal: Negative for abdominal pain, constipation and diarrhea.  Genitourinary: Negative for difficulty urinating, frequency and hematuria.  Musculoskeletal: Positive for arthralgias.       Occasional  Skin: Negative for color change and pallor.  Neurological: Negative for dizziness and headaches.  Psychiatric/Behavioral: Negative for dysphoric mood. The patient is not nervous/anxious.     BP 124/60 (BP Location: Right Arm, Patient  Position: Sitting, Cuff Size: Normal)   Pulse 72   Temp 97.8 F (36.6 C) (Temporal)   Resp 16   Ht 6\' 2"  (1.88 m)   Wt 196 lb (88.9 kg)   SpO2 95%   BMI 25.16 kg/m   Physical Exam  Constitutional: He is oriented to person, place, and time. He appears well-developed and well-nourished. No distress.  Pleasant elderly gentleman.  Struggles a little to get up off exam  chair  HENT:  Head: Normocephalic and atraumatic.  Mouth/Throat: Oropharynx is clear and moist.  dentures  Eyes: Pupils are equal, round, and reactive to light. Conjunctivae are normal.  Neck: Normal range of motion. Neck supple.  Cardiovascular: Normal rate, regular rhythm and normal heart sounds.   Pulmonary/Chest: Effort normal and breath sounds normal. He has no rales.  Abdominal: Soft. Bowel sounds are normal. There is no tenderness.  Musculoskeletal: Normal range of motion. He exhibits no edema.  Neurological: He is alert and oriented to person, place, and time.  Skin: Skin is warm and dry. No pallor.  Good color today  Psychiatric: He has a normal mood and affect. His behavior is normal.    ASSESSMENT/PLAN:  1. Coronary artery disease involving coronary bypass graft of native heart with other forms of angina pectoris ([place]) asymptomatic  2. Essential hypertension good  3. Type 2 diabetes mellitus without complication, without long-term current use of insulin ([facility]) Not checking sugars, last A1c good.  No known complications  4. Hyperlipidemia, unspecified hyperlipidemia type On statin, compliant   Patient Instructions  You are doing well BP is great No change in medicines Don't forget the flu shot  See me in 6 months for a PE     [name], MD

## 2017-08-29 ENCOUNTER — Other Ambulatory Visit: Payer: Self-pay | Admitting: Urology

## 2017-09-05 ENCOUNTER — Encounter (HOSPITAL_COMMUNITY): Payer: Self-pay

## 2017-09-05 ENCOUNTER — Encounter (HOSPITAL_COMMUNITY): Payer: Medicare Other

## 2017-09-05 ENCOUNTER — Encounter (HOSPITAL_COMMUNITY): Payer: Medicare Other | Attending: Oncology | Admitting: Oncology

## 2017-09-05 VITALS — BP 130/61 | HR 73 | Resp 16 | Ht 74.0 in | Wt 195.0 lb

## 2017-09-05 DIAGNOSIS — N183 Chronic kidney disease, stage 3 (moderate): Secondary | ICD-10-CM | POA: Diagnosis not present

## 2017-09-05 DIAGNOSIS — E538 Deficiency of other specified B group vitamins: Secondary | ICD-10-CM | POA: Diagnosis not present

## 2017-09-05 DIAGNOSIS — D509 Iron deficiency anemia, unspecified: Secondary | ICD-10-CM | POA: Diagnosis not present

## 2017-09-05 DIAGNOSIS — C61 Malignant neoplasm of prostate: Secondary | ICD-10-CM

## 2017-09-05 DIAGNOSIS — D500 Iron deficiency anemia secondary to blood loss (chronic): Secondary | ICD-10-CM

## 2017-09-05 LAB — CBC WITH DIFFERENTIAL/PLATELET
Basophils Absolute: 0 10*3/uL (ref 0.0–0.1)
Basophils Relative: 0 %
Eosinophils Absolute: 0.2 10*3/uL (ref 0.0–0.7)
Eosinophils Relative: 3 %
HCT: 33.5 % — ABNORMAL LOW (ref 39.0–52.0)
Hemoglobin: 11.3 g/dL — ABNORMAL LOW (ref 13.0–17.0)
Lymphocytes Relative: 20 %
Lymphs Abs: 1.2 10*3/uL (ref 0.7–4.0)
MCH: 32 pg (ref 26.0–34.0)
MCHC: 33.7 g/dL (ref 30.0–36.0)
MCV: 94.9 fL (ref 78.0–100.0)
Monocytes Absolute: 0.4 10*3/uL (ref 0.1–1.0)
Monocytes Relative: 6 %
Neutro Abs: 4.1 10*3/uL (ref 1.7–7.7)
Neutrophils Relative %: 71 %
Platelets: 145 10*3/uL — ABNORMAL LOW (ref 150–400)
RBC: 3.53 MIL/uL — ABNORMAL LOW (ref 4.22–5.81)
RDW: 15.2 % (ref 11.5–15.5)
WBC: 5.8 10*3/uL (ref 4.0–10.5)

## 2017-09-05 LAB — IRON AND TIBC
Iron: 46 ug/dL (ref 45–182)
Saturation Ratios: 18 % (ref 17.9–39.5)
TIBC: 258 ug/dL (ref 250–450)
UIBC: 212 ug/dL

## 2017-09-05 LAB — FERRITIN: Ferritin: 136 ng/mL (ref 24–336)

## 2017-09-05 NOTE — Progress Notes (Signed)
HEMATOLOGY/ONCOLOGY PROGRESS NOTE  Date of Service: 09/05/2017  Patient Care Team: Andrew Everts, MD as PCP - General (Family Medicine) Andrew Commons, MD as Attending Physician (Cardiology) Andrew Binder, MD as Consulting Physician (Gastroenterology)  CHIEF COMPLAINTS/PURPOSE OF CONSULTATION:  Anemia Stage III chronic kidney disease Low B12 Hypogonadism Iron deficiency  HISTORY OF PRESENTING ILLNESS:   Andrew Zhang is a wonderful 83 y.o. male who has been referred to [place] by Andrew Zhang, Andrew Banker, MD for evaluation and management of Anemia.  Patient has a history of coronary artery disease status post CABG, hypertension, diabetes, dyslipidemia, abdominal aortic aneurysm status post repair, prostate cancer currently on Lupron shots every 3 months (follows with Andrew Zhang).   Patient presents with his wife today for continued follow-up of his anemia. Patient was having gross hematuria and another up getting a cystoscopy and retrograde pyelogram on 08/13/17 by Andrew Zhang. He was found to have multiple's stones growing on the left ureteral stent, which was broken by laser. Since his procedure he has not had any more hematuria. Patient recently received Feraheme on 08/01/17. Hemoglobin today is 11.3 g/dL. He states that he feels well. He denies any fatigue, chest pain, shortness breath, abdominal pain, recent infections, focal weakness.  MEDICAL HISTORY:  Past Medical History:  Diagnosis Date  . AAA (abdominal aortic aneurysm) ([place])   . Anemia   . Arthritis   . B12 deficiency   . Blood transfusion without reported diagnosis   . CAD (coronary artery disease)   . Cancer [facility])    radiation for prostate cancer and lupron.  . Cataract   . Complication of anesthesia    sts,"after hearat surgery my stomach didnt wake up like it was supossed to".  . Depression   . Diverticulitis   . DMII (diabetes mellitus, type 2) ([place]) 2008  . GERD (gastroesophageal reflux  disease)   . GI bleed   . GIB (gastrointestinal bleeding) 09/18/2016  . Hiatal hernia   . History of kidney stones   . HTN (hypertension)   . Hyperlipidemia   . Iron deficiency anemia due to chronic blood loss 01/08/2017  . Kidney stone   . PONV (postoperative nausea and vomiting)   . Prostate cancer ([place])   . Sleep apnea    cannot tolerate, PCP aware  . Ureteral stricture, left     SURGICAL HISTORY: Past Surgical History:  Procedure Laterality Date  . APPENDECTOMY    . BACK SURGERY     x2  . CATARACT EXTRACTION Left   . CHOLECYSTECTOMY    . COLONOSCOPY  03/2015   Andrew. Britta Mccreedy: Diverticulosis, single tubular adenoma removed.  . COLONOSCOPY WITH PROPOFOL N/A 02/25/2017   Andrew. Oneida Alar: non-thrombosed external hemorrhoids, significant looping of left colon. severe diverticulosis in recto-sigmoid colon and sigmoid colon. 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 CORONARY ARTERY BYPASS GRAFT     1995  . CYSTOSCOPY     with stent-left kidney. x2  . CYSTOSCOPY W/ URETERAL STENT PLACEMENT Left 05/14/2017   Procedure: CYSTOSCOPY WITH RETROGRADE PYELOGRAM/URETERAL STENT EXCHANGE;  Surgeon: Cleon Gustin, MD;  Location: [facility];  Service: Urology;  Laterality: Left;  . CYSTOSCOPY WITH FULGERATION  08/13/2017   Procedure: CYSTOSCOPY WITH FULGERATION OF BLADDER;  Surgeon: Cleon Gustin, MD;  Location: [facility];  Service: Urology;;  . Consuela Mimes WITH RETROGRADE PYELOGRAM, URETEROSCOPY AND STENT PLACEMENT Left 08/13/2017   Procedure: CYSTOSCOPY WITH LEFT  RETROGRADE PYELOGRAM, LEFT URETEROSCOPY AND STENT REPLACEMENT;  Surgeon: Cleon Gustin, MD;  Location: [facility];  Service: Urology;  Laterality: Left;  . DESCENDING AORTIC ANEURYSM REPAIR W/ STENT     x2  . ESOPHAGOGASTRODUODENOSCOPY N/A 07/30/2016   Andrew. Oneida Alar. Nonobstructing Schatzki ring at GE junction, multiple small sessile polyps  with no bleeding or stigmata of recent bleeding in the gastric fundus and gastric body. Benign-appearing intrinsic moderate stenosis found the pylorus status post dilation. Small bowel capsule deployed.  Marland Kitchen GIVENS CAPSULE STUDY     Capsule study is complete to the cecum. No obvious lesions, mass, tumors. Small wisps of blood noted in small bowel secondary to EGD/dilation. Possible few erosions in setting of NSAIDs. No obvious areas of bleeding.  Marland Kitchen HERNIA REPAIR    . HOLMIUM LASER APPLICATION Left 3/55/7322   Procedure: HOLMIUM LASER LITHOTRIPSY OF ENCRUSTED LEFT URETERAL STENT;  Surgeon: Cleon Gustin, MD;  Location: [facility];  Service: Urology;  Laterality: Left;  . SPINE SURGERY     ruptured discs    SOCIAL HISTORY: Social History   Social History  . Marital status: Married    Spouse name: Andrew Zhang  . Number of children: 5  . Years of education: 59- GED   Occupational History  . retired     Contractor   Social History Main Topics  . Smoking status: Former Smoker    Packs/day: 3.00    Years: 45.00    Types: Cigarettes    Start date: 04/19/1951    Quit date: 03/01/1994  . Smokeless tobacco: Never Used  . Alcohol use 0.0 oz/week     Comment: 1-2 beers daily   . Drug use: No  . Sexual activity: Yes    Birth control/ protection: None     Comment: married 59 years - 5 children    Other Topics Concern  . Not on file   Social History Narrative   Married to Andrew Zhang for 60+ years   4 years in the [place]   Retired, mows grass    FAMILY HISTORY: Family History  Problem Relation Age of Onset  . Heart disease Unknown   . Diabetes Unknown   . Hypertension Unknown   . Liver cancer Mother   . Arthritis Mother   . Cancer Mother        died at 15  . Heart disease Mother   . Alzheimer's disease Father   . Cancer Brother        lung  . Cancer Son 49       lung  . Hepatitis Brother   . Alcohol abuse Brother   . Heart disease Son 53       heart attack, stents  . Diabetes Son     . Colon cancer Neg Hx     ALLERGIES:  is allergic to penicillins.  MEDICATIONS:  Current Outpatient Prescriptions  Medication Sig Dispense Refill  . cholecalciferol (VITAMIN D) 1000 units tablet Take 2,000 Units by mouth daily.    . Cyanocobalamin 1000 MCG/ML LIQD Place 1 mL under the tongue daily.     . finasteride (PROSCAR) 5 MG tablet Take 5 mg by mouth daily.    Marland Kitchen glimepiride (AMARYL) 2 MG tablet Take 2 mg by mouth 2 (two) times daily.     . nitroGLYCERIN (NITROSTAT) 0.4 MG SL tablet Place 1 tablet (0.4 mg total) under the tongue every 5 (five) minutes as needed for chest pain. 25 tablet 3  . omeprazole (PRILOSEC)  20 MG capsule Take 1 capsule (20 mg total) by mouth 2 (two) times daily before a meal. (Patient taking differently: Take 20 mg by mouth daily. ) 60 capsule 0  . sertraline (ZOLOFT) 100 MG tablet Take 50 mg by mouth daily.    . simvastatin (ZOCOR) 80 MG tablet Take 80 mg by mouth at bedtime.    . tamsulosin (FLOMAX) 0.4 MG CAPS Take 0.4 mg by mouth daily after supper.    . traMADol (ULTRAM) 50 MG tablet Take 1 tablet (50 mg total) by mouth every 6 (six) hours as needed. 30 tablet 0   No current facility-administered medications for this visit.      REVIEW OF SYSTEMS:   Review of Systems  Constitutional: Negative.  Negative for malaise/fatigue.  HENT: Negative.   Eyes: Negative.   Respiratory: Negative.   Cardiovascular: Negative.   Gastrointestinal: Negative.  Negative for abdominal pain, blood in stool and melena.  Genitourinary: Negative.   Musculoskeletal: Negative.   Skin: Negative.   Neurological: Negative.   Endo/Heme/Allergies: Negative.   Psychiatric/Behavioral: Negative.   All other systems reviewed and are negative.  14 point review of systems was performed and is negative except as detailed under history of present illness and above   PHYSICAL EXAMINATION: ECOG PERFORMANCE STATUS: 1 - Symptomatic but completely ambulatory  Vitals:   09/05/17  1500  BP: 130/61  Pulse: 73  Resp: 16  SpO2: 97%   Filed Weights   09/05/17 1500  Weight: 195 lb (88.5 kg)      Physical Exam  Constitutional: He is oriented to person, place, and time and well-developed, well-nourished, and in no distress.  HENT:  Head: Normocephalic and atraumatic.  Mouth/Throat: Oropharynx is clear and moist.  Eyes: Pupils are equal, round, and reactive to light. Conjunctivae and EOM are normal.  Neck: Normal range of motion. Neck supple.  Cardiovascular: Normal rate, regular rhythm and normal heart sounds.   Pulmonary/Chest: Effort normal and breath sounds normal.  Abdominal: Soft. Bowel sounds are normal.  Musculoskeletal: Normal range of motion.  Neurological: He is alert and oriented to person, place, and time. Gait normal.  Skin: Skin is warm and dry.  Nursing note and vitals reviewed.   LABORATORY DATA:  I have reviewed the data as listed  CBC Latest Ref Rng & Units 09/05/2017 08/25/2017 08/18/2017  WBC 4.0 - 10.5 K/uL 5.8 5.6 3.7(L)  Hemoglobin 13.0 - 17.0 g/dL 11.3(L) 10.8(L) 10.7(L)  Hematocrit 39.0 - 52.0 % 33.5(L) 32.4(L) 32.7(L)  Platelets 150 - 400 K/uL 145(L) 142(L) 139(L)   . CBC    Component Value Date/Time   WBC 5.8 09/05/2017 1429   RBC 3.53 (L) 09/05/2017 1429   HGB 11.3 (L) 09/05/2017 1429   HCT 33.5 (L) 09/05/2017 1429   HCT 30.2 (L) 08/02/2016 1439   PLT 145 (L) 09/05/2017 1429   MCV 94.9 09/05/2017 1429   MCH 32.0 09/05/2017 1429   MCHC 33.7 09/05/2017 1429   RDW 15.2 09/05/2017 1429   LYMPHSABS 1.2 09/05/2017 1429   MONOABS 0.4 09/05/2017 1429   EOSABS 0.2 09/05/2017 1429   BASOSABS 0.0 09/05/2017 1429    CMP Latest Ref Rng & Units 08/04/2017 08/04/2017 07/28/2017  Glucose 65 - 99 mg/dL 288(H) - 255(H)  BUN 6 - 20 mg/dL 26(H) - 29(H)  Creatinine 0.61 - 1.24 mg/dL 1.97(H) - 1.85(H)  Sodium 135 - 145 mmol/L 138 - 136  Potassium 3.5 - 5.1 mmol/L 3.9 - 3.8  Chloride 101 - 111 mmol/L  107 - 105  CO2 22 - 32 mmol/L 24 - 24   Calcium 8.6 - 10.2 mg/dL 8.6(L) 8.2(L) 8.5(L)  Total Protein 6.5 - 8.1 g/dL 6.0(L) - -  Total Bilirubin 0.3 - 1.2 mg/dL 0.6 - -  Alkaline Phos 38 - 126 U/L 60 - -  AST 15 - 41 U/L 24 - -  ALT 17 - 63 U/L 19 - -       RADIOGRAPHIC STUDIES: I have personally reviewed the radiological images as listed and agreed with the findings in the report. Study Result   CLINICAL DATA:  83 year old male with weakness, dizziness, shortness of breath for 3 weeks. Anemia. Former smoker. Initial encounter.  EXAM: CHEST  2 VIEW  COMPARISON:  [facility] chest radiographs 06/26/2016 and earlier.  CT Abdomen and Pelvis 06/26/2016  FINDINGS: Stable mediastinal contours. Mild cardiomegaly. Sequelae of CABG. Visualized tracheal air column is within normal limits. Partially visible abdominal aortic endograft. Stable lung volumes. No pneumothorax, pulmonary edema, pleural effusion or acute pulmonary opacity. Calcified thoracic aortic atherosclerosis. No acute osseous abnormality identified. Stable cholecystectomy clips.  IMPRESSION: 1.  No acute cardiopulmonary abnormality. 2. Calcified aortic atherosclerosis.   Electronically Signed   By: Genevie Ann M.D.   On: 07/29/2016 16:19     ASSESSMENT & PLAN:  Anemia, multifactorial due to radiation proctitis, CKD Stage III chronic kidney disease Low B12 Hypogonadism Iron deficiency Lupron use for prostate carcinoma Thrombocytopenia Colonoscopy 02/2017-severe diverticulosis, radiation proctitis  hematuria  Labs reviewed. Results noted above.  His most recent labs showed his hemoglobin at 11.3 d/gL. Continue procrit through his nephrologist.  Recent hematuria due to left ureteral stent stones, has resolved. Patient recently received Feraheme on 08/01/17. Iron studies pending, maintain ferritin >100.  RTC in 3 months. Repeat labs prior to his next visit. CBC, CMP, and iron studies, EPO level.   All of the patients  questions were answered with apparent satisfaction. The patient knows to call the clinic with any problems, questions or concerns.   Twana First, MD 09/05/2017 3:14 PM

## 2017-09-10 ENCOUNTER — Encounter: Payer: Self-pay | Admitting: Gastroenterology

## 2017-09-10 NOTE — Progress Notes (Signed)
Let's make sure he has an appt sometime in November.

## 2017-09-10 NOTE — Progress Notes (Signed)
APPT MADE AND LETTER SENT  °

## 2017-09-10 NOTE — Progress Notes (Signed)
Pt said he is doing a lot better, but Ok to schedule him for follow up in November. Forwarding to [place] to schedule.

## 2017-09-19 ENCOUNTER — Other Ambulatory Visit (HOSPITAL_COMMUNITY): Payer: Medicare Other

## 2017-10-01 ENCOUNTER — Encounter (HOSPITAL_COMMUNITY): Payer: Medicare Other | Attending: Oncology

## 2017-10-01 DIAGNOSIS — C61 Malignant neoplasm of prostate: Secondary | ICD-10-CM | POA: Diagnosis not present

## 2017-10-01 DIAGNOSIS — E559 Vitamin D deficiency, unspecified: Secondary | ICD-10-CM | POA: Insufficient documentation

## 2017-10-02 ENCOUNTER — Encounter: Payer: Self-pay | Admitting: Cardiovascular Disease

## 2017-10-02 ENCOUNTER — Ambulatory Visit (INDEPENDENT_AMBULATORY_CARE_PROVIDER_SITE_OTHER): Payer: Medicare Other | Admitting: Cardiovascular Disease

## 2017-10-02 VITALS — BP 138/70 | HR 67 | Ht 74.0 in | Wt 195.0 lb

## 2017-10-02 DIAGNOSIS — I714 Abdominal aortic aneurysm, without rupture, unspecified: Secondary | ICD-10-CM

## 2017-10-02 DIAGNOSIS — E782 Mixed hyperlipidemia: Secondary | ICD-10-CM

## 2017-10-02 DIAGNOSIS — I209 Angina pectoris, unspecified: Secondary | ICD-10-CM | POA: Diagnosis not present

## 2017-10-02 DIAGNOSIS — I25768 Atherosclerosis of bypass graft of coronary artery of transplanted heart with other forms of angina pectoris: Secondary | ICD-10-CM

## 2017-10-02 DIAGNOSIS — I10 Essential (primary) hypertension: Secondary | ICD-10-CM | POA: Diagnosis not present

## 2017-10-02 LAB — VITAMIN D 25 HYDROXY (VIT D DEFICIENCY, FRACTURES): Vit D, 25-Hydroxy: 46.4 ng/mL (ref 30.0–100.0)

## 2017-10-02 MED ORDER — ASPIRIN EC 81 MG PO TBEC: 81.0000 mg | DELAYED_RELEASE_TABLET | Freq: Every day | ORAL | Status: DC

## 2017-10-02 NOTE — Progress Notes (Signed)
SUBJECTIVE: [name] has a history of DM II, HTN, PVD, and CAD, with a h/o CABG in 02/1994 by Dr. Nils Zhang with a LIMA to LAD, SVG to D1, and SVG to RCA.   Nuclear stress test on 09/14/14 demonstrated myocardial scar in the inferior wall extending from the apex to the base with no evidence of ischemia. Calculated LVEF 50%.  He also has a history of normocytic normochromic anemia likely multifactorial in etiology including iron deficiency, chronic kidney disease stage III, low testosterone, and GI bleeding.  The patient denies any symptoms of chest pain, palpitations, shortness of breath, lightheadedness, dizziness, leg swelling, orthopnea, PND, and syncope.  He underwent left retrograde pyelography and cystoscopy on 08/13/17 and was found to have an encrusted left ureteral stent by Dr. Alyson Zhang.  He is not on aspirin. Hemoglobin has been stable.  Review of Systems: As per "subjective", otherwise negative.  Allergies  Allergen Reactions  . Penicillins Rash and Other (See Comments)    Happened in [id number] Has patient had a PCN reaction causing immediate rash, facial/tongue/throat swelling, SOB or lightheaddness with hypotension: Yes  Has patient had a PCN reaction causing severe rash involving mucus membranes or skin necrosis: Unknown Has patient had a PCN reaction that required hospitalization: No Has patient had a PCN reaction occurring within the last 10 years: No If all of the above answers are "NO", then may proceed with Cephalosporin use.    Current Outpatient Prescriptions  Medication Sig Dispense Refill  . cholecalciferol (VITAMIN D) 1000 units tablet Take 2,000 Units by mouth daily.    . Cyanocobalamin 1000 MCG/ML LIQD Place 1 mL under the tongue daily.     . finasteride (PROSCAR) 5 MG tablet Take 5 mg by mouth daily.    Marland Kitchen glimepiride (AMARYL) 2 MG tablet Take 2 mg by mouth 2 (two) times daily.     . nitroGLYCERIN (NITROSTAT) 0.4 MG SL tablet Place 1 tablet (0.4  mg total) under the tongue every 5 (five) minutes as needed for chest pain. 25 tablet 3  . omeprazole (PRILOSEC) 20 MG capsule Take 1 capsule (20 mg total) by mouth 2 (two) times daily before a meal. (Patient taking differently: Take 20 mg by mouth daily. ) 60 capsule 0  . sertraline (ZOLOFT) 100 MG tablet Take 50 mg by mouth daily.    . simvastatin (ZOCOR) 80 MG tablet Take 80 mg by mouth at bedtime.    . tamsulosin (FLOMAX) 0.4 MG CAPS Take 0.4 mg by mouth daily after supper.     No current facility-administered medications for this visit.     Past Medical History:  Diagnosis Date  . AAA (abdominal aortic aneurysm) ([place])   . Anemia   . Arthritis   . B12 deficiency   . Blood transfusion without reported diagnosis   . CAD (coronary artery disease)   . Cancer [facility])    radiation for prostate cancer and lupron.  . Cataract   . Complication of anesthesia    sts,"after hearat surgery my stomach didnt wake up like it was supossed to".  . Depression   . Diverticulitis   . DMII (diabetes mellitus, type 2) ([place]) 2008  . GERD (gastroesophageal reflux disease)   . GI bleed   . GIB (gastrointestinal bleeding) 09/18/2016  . Hiatal hernia   . History of kidney stones   . HTN (hypertension)   . Hyperlipidemia   . Iron deficiency anemia due to chronic blood loss 01/08/2017  .  Kidney stone   . PONV (postoperative nausea and vomiting)   . Prostate cancer ([place])   . Sleep apnea    cannot tolerate, PCP aware  . Ureteral stricture, left     Past Surgical History:  Procedure Laterality Date  . APPENDECTOMY    . BACK SURGERY     x2  . CATARACT EXTRACTION Left   . CHOLECYSTECTOMY    . COLONOSCOPY  03/2015   Dr. Britta Zhang: Diverticulosis, single tubular adenoma removed.  . COLONOSCOPY WITH PROPOFOL N/A 02/25/2017   Dr. Oneida Zhang: non-thrombosed external hemorrhoids, significant looping of left colon. severe diverticulosis in recto-sigmoid colon and sigmoid colon. radiation proctitis contributing to  transfusion dependent anemia, exacerbated by chronic renal insufficiency and thrombocytopenia/aspirin use  . CORONARY ARTERY BYPASS GRAFT     1995  . CYSTOSCOPY     with stent-left kidney. x2  . CYSTOSCOPY W/ URETERAL STENT PLACEMENT Left 05/14/2017   Procedure: CYSTOSCOPY WITH RETROGRADE PYELOGRAM/URETERAL STENT EXCHANGE;  Surgeon: Andrew Gustin, MD;  Location: [facility];  Service: Urology;  Laterality: Left;  . CYSTOSCOPY WITH FULGERATION  08/13/2017   Procedure: CYSTOSCOPY WITH FULGERATION OF BLADDER;  Surgeon: Andrew Gustin, MD;  Location: [facility];  Service: Urology;;  . Consuela Mimes WITH RETROGRADE PYELOGRAM, URETEROSCOPY AND STENT PLACEMENT Left 08/13/2017   Procedure: CYSTOSCOPY WITH LEFT RETROGRADE PYELOGRAM, LEFT URETEROSCOPY AND STENT REPLACEMENT;  Surgeon: Andrew Gustin, MD;  Location: [facility];  Service: Urology;  Laterality: Left;  . DESCENDING AORTIC ANEURYSM REPAIR W/ STENT     x2  . ESOPHAGOGASTRODUODENOSCOPY N/A 07/30/2016   Dr. Oneida Zhang. Nonobstructing Schatzki ring at GE junction, multiple small sessile polyps with no bleeding or stigmata of recent bleeding in the gastric fundus and gastric body. Benign-appearing intrinsic moderate stenosis found the pylorus status post dilation. Small bowel capsule deployed.  Marland Kitchen GIVENS CAPSULE STUDY     Capsule study is complete to the cecum. No obvious lesions, mass, tumors. Small wisps of blood noted in small bowel secondary to EGD/dilation. Possible few erosions in setting of NSAIDs. No obvious areas of bleeding.  Marland Kitchen HERNIA REPAIR    . HOLMIUM LASER APPLICATION Left 9/93/7169   Procedure: HOLMIUM LASER LITHOTRIPSY OF ENCRUSTED LEFT URETERAL STENT;  Surgeon: Andrew Gustin, MD;  Location: [facility];  Service: Urology;  Laterality: Left;  . SPINE SURGERY     ruptured discs    Social History   Social History  . Marital status: Married    Spouse name: Andrew Zhang  . Number of children: 5  . Years of education: 23- GED   Occupational  History  . retired     Contractor   Social History Main Topics  . Smoking status: Former Smoker    Packs/day: 3.00    Years: 45.00    Types: Cigarettes    Start date: 04/19/1951    Quit date: 03/01/1994  . Smokeless tobacco: Never Used  . Alcohol use 0.0 oz/week     Comment: 1-2 beers daily   . Drug use: No  . Sexual activity: Yes    Birth control/ protection: None     Comment: married 59 years - 5 children    Other Topics Concern  . Not on file   Social History Narrative   Married to [place] for 60+ years   4 years in the [place]   Retired, mows grass     Vitals:   10/02/17 1341  BP: 138/70  Pulse: 67  SpO2: 95%  Weight: 195 lb (88.5  kg)  Height: 6\' 2"  (1.88 m)    Wt Readings from Last 3 Encounters:  10/02/17 195 lb (88.5 kg)  09/05/17 195 lb (88.5 kg)  08/28/17 196 lb (88.9 kg)     PHYSICAL EXAM General: NAD HEENT: Normal. Neck: No JVD, no thyromegaly. Lungs: Clear to auscultation bilaterally with normal respiratory effort. CV: Nondisplaced PMI.  Regular rate and rhythm, normal S1/S2, no S3/S4, no murmur. No pretibial or periankle edema.  No carotid bruit.   Abdomen: Soft, nontender, no distention.  Neurologic: Alert and oriented.  Psych: Normal affect. Skin: Normal. Musculoskeletal: No gross deformities.    ECG: Most recent ECG reviewed.   Labs: Lab Results  Component Value Date/Time   K 3.9 08/04/2017 11:37 AM   BUN 26 (H) 08/04/2017 11:37 AM   CREATININE 1.97 (H) 08/04/2017 11:37 AM   ALT 19 08/04/2017 11:37 AM   HGB 11.3 (L) 09/05/2017 02:29 PM     Lipids: No results found for: LDLCALC, LDLDIRECT, CHOL, TRIG, HDL     ASSESSMENT AND PLAN:  1. CAD s/p 3-vessel CABG: No ischemia by stress testing in September 2015. Symptomatically stable. Continue present medication regimen with simvastatin. No longer on ASA, presumably due to hematuria.As this has resolved, I will restart aspirin 81 mg daily.  2. Essential HTN: Controlledon present  therapy. No adjustments necessary.   3. Hyperlipidemia: Continue simvastatin 80 mg. I will obtain a lipid panel.  4. PVD: He follows with Dr. Sammuel Hines at [facility]. He has not seen him in 2 years. I encouraged the patient and his wife to call him to make an appointment in the near future.     Disposition: Follow up 6 months.   Kate Sable, M.D., F.A.C.C.

## 2017-10-02 NOTE — Patient Instructions (Addendum)
Medication Instructions:   Begin Aspirin 81mg  daily.  Continue all other medications.    Labwork:  Lipids - order given today.  Reminder:  Nothing to eat or drink after 12 midnight prior to labs.  Office will contact with results via phone or letter.    Testing/Procedures: none  Follow-Up: 6 months   Any Other Special Instructions Will Be Listed Below (If Applicable).  If you need a refill on your cardiac medications before your next appointment, please call your pharmacy.

## 2017-10-02 NOTE — Addendum Note (Signed)
Addended by: Laurine Blazer on: 10/02/2017 02:25 PM   Modules accepted: Orders

## 2017-10-03 NOTE — Patient Instructions (Signed)
Andrew Zhang  10/03/2017     @PREFPERIOPPHARMACY @   Your procedure is scheduled on 10/13/17  Report to [facility] at 0930 A.M.  Call this number if you have problems the morning of surgery:  [phone]   Remember:  Do not eat food or drink liquids after midnight.  Take these medicines the morning of surgery with A SIP OF WATER proscar, prilosec, zoloft, & flomax. No diabetic medicine morning of surgery.   Do not wear jewelry, make-up or nail polish.  Do not wear lotions, powders, or perfumes, or deoderant.  Do not shave 48 hours prior to surgery.  Men may shave face and neck.  Do not bring valuables to the hospital.  [facility] is not responsible for any belongings or valuables.  Contacts, dentures or bridgework may not be worn into surgery.  Leave your suitcase in the car.  After surgery it may be brought to your room.  For patients admitted to the hospital, discharge time will be determined by your treatment team.  Patients discharged the day of surgery will not be allowed to drive home.   Name and phone number of your driver:   family Special instructions:    Please read over the following fact sheets that you were given. Surgical Site Infection Prevention and Anesthesia Post-op Instructions      PATIENT INSTRUCTIONS POST-ANESTHESIA  IMMEDIATELY FOLLOWING SURGERY:  Do not drive or operate machinery for the first twenty four hours after surgery.  Do not make any important decisions for twenty four hours after surgery or while taking narcotic pain medications or sedatives.  If you develop intractable nausea and vomiting or a severe headache please notify your doctor immediately.  FOLLOW-UP:  Please make an appointment with your surgeon as instructed. You do not need to follow up with anesthesia unless specifically instructed to do so.  WOUND CARE INSTRUCTIONS (if applicable):  Keep a dry clean dressing on the anesthesia/puncture wound site if there is drainage.   Once the wound has quit draining you may leave it open to air.  Generally you should leave the bandage intact for twenty four hours unless there is drainage.  If the epidural site drains for more than 36-48 hours please call the anesthesia department.  QUESTIONS?:  Please feel free to call your physician or the hospital operator if you have any questions, and they will be happy to assist you.      Cystoscopy Cystoscopy is a procedure that is used to help diagnose and sometimes treat conditions that affect that lower urinary tract. The lower urinary tract includes the bladder and the tube that drains urine from the bladder out of the body (urethra). Cystoscopy is performed with a thin, tube-shaped instrument with a light and camera at the end (cystoscope). The cystoscope may be hard (rigid) or flexible, depending on the goal of the procedure.The cystoscope is inserted through the urethra, into the bladder. Cystoscopy may be recommended if you have:  Urinary tractinfections that keep coming back (recurring).  Blood in the urine (hematuria).  Loss of bladder control (urinary incontinence) or an overactive bladder.  Unusual cells found in a urine sample.  A blockage in the urethra.  Painful urination.  An abnormality in the bladder found during an intravenous pyelogram (IVP) or CT scan.  Cystoscopy may also be done to remove a sample of tissue to be examined under a microscope (biopsy). Tell a health care provider about:  Any allergies you have.  All medicines  you are taking, including vitamins, herbs, eye drops, creams, and over-the-counter medicines.  Any problems you or family members have had with anesthetic medicines.  Any blood disorders you have.  Any surgeries you have had.  Any medical conditions you have.  Whether you are pregnant or may be pregnant. What are the risks? Generally, this is a safe procedure. However, problems may occur,  including:  Infection.  Bleeding.  Allergic reactions to medicines.  Damage to other structures or organs.  What happens before the procedure?  Ask your health care provider about: ? Changing or stopping your regular medicines. This is especially important if you are taking diabetes medicines or blood thinners. ? Taking medicines such as aspirin and ibuprofen. These medicines can thin your blood. Do not take these medicines before your procedure if your health care provider instructs you not to.  Follow instructions from your health care provider about eating or drinking restrictions.  You may be given antibiotic medicine to help prevent infection.  You may have an exam or testing, such as X-rays of the bladder, urethra, or kidneys.  You may have urine tests to check for signs of infection.  Plan to have someone take you home after the procedure. What happens during the procedure?  To reduce your risk of infection,your health care team will wash or sanitize their hands.  You will be given one or more of the following: ? A medicine to help you relax (sedative). ? A medicine to numb the area (local anesthetic).  The area around the opening of your urethra will be cleaned.  The cystoscope will be passed through your urethra into your bladder.  Germ-free (sterile)fluid will flow through the cystoscope to fill your bladder. The fluid will stretch your bladder so that your surgeon can clearly examine your bladder walls.  The cystoscope will be removed and your bladder will be emptied. The procedure may vary among health care providers and hospitals. What happens after the procedure?  You may have some soreness or pain in your abdomen and urethra. Medicines will be available to help you.  You may have some blood in your urine.  Do not drive for 24 hours if you received a sedative. This information is not intended to replace advice given to you by your health care provider.  Make sure you discuss any questions you have with your health care provider. Document Released: 12/13/2000 Document Revised: 04/25/2016 Document Reviewed: 11/02/2015 Elsevier Interactive Patient Education  2017 [employer].

## 2017-10-07 ENCOUNTER — Other Ambulatory Visit: Payer: Self-pay | Admitting: Cardiovascular Disease

## 2017-10-07 DIAGNOSIS — E782 Mixed hyperlipidemia: Secondary | ICD-10-CM | POA: Diagnosis not present

## 2017-10-07 LAB — LIPID PANEL
CHOL/HDL RATIO: 4.5 (calc) (ref <5.0)
CHOLESTEROL: 126 mg/dL (ref <200)
HDL: 28 mg/dL — ABNORMAL LOW (ref >40)
LDL Cholesterol (Calc): 72 mg/dL (calc)
NON-HDL CHOLESTEROL (CALC): 98 mg/dL (ref <130)
Triglycerides: 192 mg/dL — ABNORMAL HIGH (ref <150)

## 2017-10-08 ENCOUNTER — Encounter (HOSPITAL_COMMUNITY)
Admission: RE | Admit: 2017-10-08 | Discharge: 2017-10-08 | Disposition: A | Discharge status: Home / Self Care | Payer: Medicare Other | Source: Ambulatory Visit | Attending: Urology | Admitting: Urology

## 2017-10-08 ENCOUNTER — Telehealth: Payer: Self-pay | Admitting: *Deleted

## 2017-10-08 ENCOUNTER — Ambulatory Visit (INDEPENDENT_AMBULATORY_CARE_PROVIDER_SITE_OTHER): Payer: Medicare Other | Admitting: Urology

## 2017-10-08 ENCOUNTER — Encounter (HOSPITAL_COMMUNITY): Payer: Self-pay

## 2017-10-08 DIAGNOSIS — R310 Gross hematuria: Secondary | ICD-10-CM

## 2017-10-08 DIAGNOSIS — N131 Hydronephrosis with ureteral stricture, not elsewhere classified: Secondary | ICD-10-CM

## 2017-10-08 DIAGNOSIS — Z01818 Encounter for other preprocedural examination: Secondary | ICD-10-CM | POA: Diagnosis not present

## 2017-10-08 LAB — CBC WITH DIFFERENTIAL/PLATELET
BASOS ABS: 0 10*3/uL (ref 0.0–0.1)
BASOS PCT: 0 %
Eosinophils Absolute: 0.2 10*3/uL (ref 0.0–0.7)
Eosinophils Relative: 3 %
HEMATOCRIT: 36.3 % — AB (ref 39.0–52.0)
HEMOGLOBIN: 12 g/dL — AB (ref 13.0–17.0)
LYMPHS PCT: 15 %
Lymphs Abs: 0.7 10*3/uL (ref 0.7–4.0)
MCH: 31.1 pg (ref 26.0–34.0)
MCHC: 33.1 g/dL (ref 30.0–36.0)
MCV: 94 fL (ref 78.0–100.0)
MONOS PCT: 5 %
Monocytes Absolute: 0.3 10*3/uL (ref 0.1–1.0)
NEUTROS ABS: 3.7 10*3/uL (ref 1.7–7.7)
NEUTROS PCT: 77 %
Platelets: 124 10*3/uL — ABNORMAL LOW (ref 150–400)
RBC: 3.86 MIL/uL — ABNORMAL LOW (ref 4.22–5.81)
RDW: 13.6 % (ref 11.5–15.5)
WBC: 4.9 10*3/uL (ref 4.0–10.5)

## 2017-10-08 LAB — BASIC METABOLIC PANEL
ANION GAP: 8 (ref 5–15)
BUN: 35 mg/dL — ABNORMAL HIGH (ref 6–20)
CHLORIDE: 105 mmol/L (ref 101–111)
CO2: 26 mmol/L (ref 22–32)
Calcium: 9 mg/dL (ref 8.9–10.3)
Creatinine, Ser: 1.82 mg/dL — ABNORMAL HIGH (ref 0.61–1.24)
GFR calc non Af Amer: 33 mL/min — ABNORMAL LOW (ref >60)
GFR, EST AFRICAN AMERICAN: 38 mL/min — AB (ref >60)
Glucose, Bld: 331 mg/dL — ABNORMAL HIGH (ref 65–99)
POTASSIUM: 4.2 mmol/L (ref 3.5–5.1)
Sodium: 139 mmol/L (ref 135–145)

## 2017-10-08 NOTE — Telephone Encounter (Signed)
-----   Message from Herminio Commons, MD sent at 10/08/2017 10:42 AM EDT ----- Ok. Mildly elevated TG's, needs lifestyle modification.

## 2017-10-08 NOTE — Telephone Encounter (Signed)
Called patient with test results. No answer. Left message to call back.  

## 2017-10-09 NOTE — Pre-Procedure Instructions (Signed)
Dr Patsey Berthold aware of Glucose. Will do CBG on arrival morning of surgery.

## 2017-10-13 ENCOUNTER — Ambulatory Visit (HOSPITAL_COMMUNITY): Payer: Medicare Other | Admitting: Anesthesiology

## 2017-10-13 ENCOUNTER — Encounter (HOSPITAL_COMMUNITY): Payer: Self-pay | Admitting: *Deleted

## 2017-10-13 ENCOUNTER — Encounter (HOSPITAL_COMMUNITY)
Admission: RE | Disposition: A | Discharge status: Home / Self Care | Payer: Self-pay | Source: Ambulatory Visit | Attending: Urology

## 2017-10-13 ENCOUNTER — Ambulatory Visit (HOSPITAL_COMMUNITY): Payer: Medicare Other

## 2017-10-13 ENCOUNTER — Ambulatory Visit (HOSPITAL_COMMUNITY)
Admission: RE | Admit: 2017-10-13 | Discharge: 2017-10-13 | Disposition: A | Discharge status: Home / Self Care | Payer: Medicare Other | Source: Ambulatory Visit | Attending: Urology | Admitting: Urology

## 2017-10-13 DIAGNOSIS — Z7984 Long term (current) use of oral hypoglycemic drugs: Secondary | ICD-10-CM | POA: Insufficient documentation

## 2017-10-13 DIAGNOSIS — Z7982 Long term (current) use of aspirin: Secondary | ICD-10-CM | POA: Insufficient documentation

## 2017-10-13 DIAGNOSIS — Z951 Presence of aortocoronary bypass graft: Secondary | ICD-10-CM | POA: Insufficient documentation

## 2017-10-13 DIAGNOSIS — Z79899 Other long term (current) drug therapy: Secondary | ICD-10-CM | POA: Insufficient documentation

## 2017-10-13 DIAGNOSIS — F329 Major depressive disorder, single episode, unspecified: Secondary | ICD-10-CM | POA: Insufficient documentation

## 2017-10-13 DIAGNOSIS — E785 Hyperlipidemia, unspecified: Secondary | ICD-10-CM | POA: Diagnosis not present

## 2017-10-13 DIAGNOSIS — K449 Diaphragmatic hernia without obstruction or gangrene: Secondary | ICD-10-CM | POA: Diagnosis not present

## 2017-10-13 DIAGNOSIS — N131 Hydronephrosis with ureteral stricture, not elsewhere classified: Secondary | ICD-10-CM | POA: Insufficient documentation

## 2017-10-13 DIAGNOSIS — E119 Type 2 diabetes mellitus without complications: Secondary | ICD-10-CM | POA: Diagnosis not present

## 2017-10-13 DIAGNOSIS — N135 Crossing vessel and stricture of ureter without hydronephrosis: Secondary | ICD-10-CM

## 2017-10-13 DIAGNOSIS — I714 Abdominal aortic aneurysm, without rupture: Secondary | ICD-10-CM | POA: Diagnosis not present

## 2017-10-13 DIAGNOSIS — Z8546 Personal history of malignant neoplasm of prostate: Secondary | ICD-10-CM | POA: Insufficient documentation

## 2017-10-13 DIAGNOSIS — Z87442 Personal history of urinary calculi: Secondary | ICD-10-CM | POA: Insufficient documentation

## 2017-10-13 DIAGNOSIS — Z87891 Personal history of nicotine dependence: Secondary | ICD-10-CM | POA: Insufficient documentation

## 2017-10-13 DIAGNOSIS — E538 Deficiency of other specified B group vitamins: Secondary | ICD-10-CM | POA: Diagnosis not present

## 2017-10-13 DIAGNOSIS — D649 Anemia, unspecified: Secondary | ICD-10-CM | POA: Insufficient documentation

## 2017-10-13 DIAGNOSIS — M1990 Unspecified osteoarthritis, unspecified site: Secondary | ICD-10-CM | POA: Insufficient documentation

## 2017-10-13 DIAGNOSIS — I10 Essential (primary) hypertension: Secondary | ICD-10-CM | POA: Insufficient documentation

## 2017-10-13 DIAGNOSIS — I2510 Atherosclerotic heart disease of native coronary artery without angina pectoris: Secondary | ICD-10-CM | POA: Diagnosis not present

## 2017-10-13 DIAGNOSIS — Z880 Allergy status to penicillin: Secondary | ICD-10-CM | POA: Diagnosis not present

## 2017-10-13 DIAGNOSIS — G4730 Sleep apnea, unspecified: Secondary | ICD-10-CM | POA: Diagnosis not present

## 2017-10-13 DIAGNOSIS — Z466 Encounter for fitting and adjustment of urinary device: Secondary | ICD-10-CM | POA: Diagnosis not present

## 2017-10-13 DIAGNOSIS — K219 Gastro-esophageal reflux disease without esophagitis: Secondary | ICD-10-CM | POA: Diagnosis not present

## 2017-10-13 HISTORY — PX: CYSTOSCOPY W/ URETERAL STENT PLACEMENT: SHX1429

## 2017-10-13 LAB — GLUCOSE, CAPILLARY
GLUCOSE-CAPILLARY: 224 mg/dL — AB (ref 65–99)
Glucose-Capillary: 222 mg/dL — ABNORMAL HIGH (ref 65–99)

## 2017-10-13 SURGERY — CYSTOSCOPY, WITH RETROGRADE PYELOGRAM AND URETERAL STENT INSERTION
Anesthesia: General | Laterality: Left

## 2017-10-13 MED ORDER — CEFAZOLIN SODIUM-DEXTROSE 2-4 GM/100ML-% IV SOLN
INTRAVENOUS | Status: AC
  Filled 2017-10-13: qty 100

## 2017-10-13 MED ORDER — SODIUM CHLORIDE 0.9 % IR SOLN
Status: DC | PRN
  Administered 2017-10-13: 3000 mL via INTRAVESICAL

## 2017-10-13 MED ORDER — PROPOFOL 10 MG/ML IV BOLUS
INTRAVENOUS | Status: AC
  Filled 2017-10-13: qty 20

## 2017-10-13 MED ORDER — MIDAZOLAM HCL 2 MG/2ML IJ SOLN
1.0000 mg | INTRAMUSCULAR | Status: AC
  Administered 2017-10-13: 2 mg via INTRAVENOUS

## 2017-10-13 MED ORDER — HYDROCODONE-ACETAMINOPHEN 5-325 MG PO TABS: 1.0000 | ORAL_TABLET | Freq: Four times a day (QID) | ORAL | 0 refills | Status: DC | PRN

## 2017-10-13 MED ORDER — STERILE WATER FOR IRRIGATION IR SOLN
Status: DC | PRN
  Administered 2017-10-13: 500 mL

## 2017-10-13 MED ORDER — LACTATED RINGERS IV SOLN
INTRAVENOUS | Status: DC
  Administered 2017-10-13 (×2): via INTRAVENOUS

## 2017-10-13 MED ORDER — FENTANYL CITRATE (PF) 100 MCG/2ML IJ SOLN: 25.0000 ug | INTRAMUSCULAR | Status: DC | PRN

## 2017-10-13 MED ORDER — CEFAZOLIN SODIUM-DEXTROSE 1-4 GM/50ML-% IV SOLN
INTRAVENOUS | Status: AC
  Filled 2017-10-13: qty 50

## 2017-10-13 MED ORDER — LIDOCAINE HCL (PF) 1 % IJ SOLN
INTRAMUSCULAR | Status: AC
  Filled 2017-10-13: qty 5

## 2017-10-13 MED ORDER — MIDAZOLAM HCL 2 MG/2ML IJ SOLN
INTRAMUSCULAR | Status: AC
  Filled 2017-10-13: qty 2

## 2017-10-13 MED ORDER — FENTANYL CITRATE (PF) 100 MCG/2ML IJ SOLN
INTRAMUSCULAR | Status: AC
  Filled 2017-10-13: qty 2

## 2017-10-13 MED ORDER — CEFAZOLIN SODIUM-DEXTROSE 2-4 GM/100ML-% IV SOLN
2.0000 g | INTRAVENOUS | Status: AC
  Administered 2017-10-13: 2 g via INTRAVENOUS

## 2017-10-13 MED ORDER — PROPOFOL 10 MG/ML IV BOLUS
INTRAVENOUS | Status: DC | PRN
  Administered 2017-10-13: 130 mg via INTRAVENOUS
  Administered 2017-10-13: 20 mg via INTRAVENOUS

## 2017-10-13 MED ORDER — EPHEDRINE SULFATE 50 MG/ML IJ SOLN
INTRAMUSCULAR | Status: DC | PRN
  Administered 2017-10-13: 10 mg via INTRAVENOUS

## 2017-10-13 MED ORDER — LIDOCAINE HCL (CARDIAC) 20 MG/ML IV SOLN
INTRAVENOUS | Status: DC | PRN
  Administered 2017-10-13: 30 mg via INTRAVENOUS

## 2017-10-13 MED ORDER — IOTHALAMATE MEGLUMINE 17.2 % UR SOLN
URETHRAL | Status: DC | PRN
  Administered 2017-10-13: 8 mL

## 2017-10-13 MED ORDER — FENTANYL CITRATE (PF) 100 MCG/2ML IJ SOLN
INTRAMUSCULAR | Status: DC | PRN
  Administered 2017-10-13: 50 ug via INTRAVENOUS
  Administered 2017-10-13 (×2): 25 ug via INTRAVENOUS

## 2017-10-13 SURGICAL SUPPLY — 20 items
BAG DRAIN URO TABLE W/ADPT NS (DRAPE) ×3 IMPLANT
BAG HAMPER (MISCELLANEOUS) ×3 IMPLANT
CATH INTERMIT  6FR 70CM (CATHETERS) ×3 IMPLANT
CLOTH BEACON ORANGE TIMEOUT ST (SAFETY) ×3 IMPLANT
DECANTER SPIKE VIAL GLASS SM (MISCELLANEOUS) ×3 IMPLANT
GLOVE BIO SURGEON STRL SZ8 (GLOVE) ×3 IMPLANT
GLOVE BIOGEL PI IND STRL 6.5 (GLOVE) ×1 IMPLANT
GLOVE BIOGEL PI INDICATOR 6.5 (GLOVE) ×2
GOWN STRL REUS W/ TWL XL LVL3 (GOWN DISPOSABLE) ×1 IMPLANT
GOWN STRL REUS W/TWL LRG LVL3 (GOWN DISPOSABLE) ×3 IMPLANT
GOWN STRL REUS W/TWL XL LVL3 (GOWN DISPOSABLE) ×2
GUIDEWIRE STR ZIPWIRE 035X150 (MISCELLANEOUS) ×3 IMPLANT
IV NS IRRIG 3000ML ARTHROMATIC (IV SOLUTION) ×3 IMPLANT
KIT ROOM TURNOVER AP CYSTO (KITS) ×3 IMPLANT
MANIFOLD NEPTUNE II (INSTRUMENTS) ×3 IMPLANT
PACK CYSTO (CUSTOM PROCEDURE TRAY) ×3 IMPLANT
PAD ARMBOARD 7.5X6 YLW CONV (MISCELLANEOUS) ×3 IMPLANT
STENT CONTOUR 7FRX26 (STENTS) ×3 IMPLANT
SYRINGE 10CC LL (SYRINGE) ×3 IMPLANT
TOWEL OR 17X26 4PK STRL BLUE (TOWEL DISPOSABLE) ×3 IMPLANT

## 2017-10-13 NOTE — Op Note (Signed)
Preoperative diagnosis: left ureteral stricture  Postoperative diagnosis: same  Procedure: 1 cystoscopy 2. Left retrograde pyelography 3.  Intraoperative fluoroscopy, under one hour, with interpretation 4.  Left 7 x 26 JJ stent exchange  Attending: Rosie Fate  Anesthesia: General  Estimated blood loss: None  Drains: Left 7 x 26 JJ ureteral stent without tether  Specimens: stone for analysis  Antibiotics: ancef  Findings: no left hydronephrosis. Stent not encrusted. No masses/lesions in the bladder. Ureteral orifices in normal anatomic location.  Indications: Patient is a 83 year old male with a history a left mid ureteral stricture.   After discussing treatment options, she decided proceed with left stent exchange.  Procedure in detail: The patient was brought to the operating room and a brief timeout was done to ensure correct patient, correct procedure, correct site.  General anesthesia was administered patient was placed in dorsal lithotomy position.  Her genitalia was then prepped and draped in usual sterile fashion.  A rigid 81 French cystoscope was passed in the urethra and the bladder.  Bladder was inspected free masses or lesions.  the ureteral orifices were in the normal orthotopic locations.  a 6 french ureteral catheter was then instilled into the left ureteral orifice.  a gentle retrograde was obtained and findings noted above.  we then placed a zip wire through the ureteral catheter and advanced up to the renal pelvis. we then placed a 7 x 26 double-j ureteral stent over the original zip wire.  We then removed the wire and good coil was noted in the the renal pelvis under fluoroscopy and the bladder under direct vision. the bladder was then drained and this concluded the procedure which was well tolerated by patient.  Complications: None  Condition: Stable, extubated, transferred to PACU  Plan: Patient is to be discharged home as to follow-up in 4  weeks with a renal US.

## 2017-10-13 NOTE — Anesthesia Procedure Notes (Signed)
Procedure Name: LMA Insertion Date/Time: 10/13/2017 11:47 AM Performed by: Andree Elk, AMY A Pre-anesthesia Checklist: Patient identified, Timeout performed, Emergency Drugs available, Suction available and Patient being monitored Patient Re-evaluated:Patient Re-evaluated prior to induction Oxygen Delivery Method: Circle system utilized Preoxygenation: Pre-oxygenation with 100% oxygen Induction Type: IV induction Ventilation: Mask ventilation without difficulty LMA: LMA inserted LMA Size: 5.0 Number of attempts: 1 Placement Confirmation: positive ETCO2 and breath sounds checked- equal and bilateral Tube secured with: Tape Dental Injury: Teeth and Oropharynx as per pre-operative assessment

## 2017-10-13 NOTE — Anesthesia Procedure Notes (Signed)
Performed by: Andree Elk, Tasheka Houseman A

## 2017-10-13 NOTE — Discharge Instructions (Signed)
Ureteral Stent Implantation, Care After °Refer to this sheet in the next few weeks. These instructions provide you with information about caring for yourself after your procedure. Your health care provider may also give you more specific instructions. Your treatment has been planned according to current medical practices, but problems sometimes occur. Call your health care provider if you have any problems or questions after your procedure. °What can I expect after the procedure? °After the procedure, it is common to have: °· Nausea. °· Mild pain when you urinate. You may feel this pain in your lower back or lower abdomen. Pain should stop within a few minutes after you urinate. This may last for up to 1 week. °· A small amount of blood in your urine for several days. ° °Follow these instructions at home: ° °Medicines °· Take over-the-counter and prescription medicines only as told by your health care provider. °· If you were prescribed an antibiotic medicine, take it as told by your health care provider. Do not stop taking the antibiotic even if you start to feel better. °· Do not drive for 24 hours if you received a sedative. °· Do not drive or operate heavy machinery while taking prescription pain medicines. °Activity °· Return to your normal activities as told by your health care provider. Ask your health care provider what activities are safe for you. °· Do not lift anything that is heavier than 10 lb (4.5 kg). Follow this limit for 1 week after your procedure, or for as long as told by your health care provider. °General instructions °· Watch for any blood in your urine. Call your health care provider if the amount of blood in your urine increases. °· If you have a catheter: °? Follow instructions from your health care provider about taking care of your catheter and collection bag. °? Do not take baths, swim, or use a hot tub until your health care provider approves. °· Drink enough fluid to keep your urine  clear or pale yellow. °· Keep all follow-up visits as told by your health care provider. This is important. °Contact a health care provider if: °· You have pain that gets worse or does not get better with medicine, especially pain when you urinate. °· You have difficulty urinating. °· You feel nauseous or you vomit repeatedly during a period of more than 2 days after the procedure. °Get help right away if: °· Your urine is dark red or has blood clots in it. °· You are leaking urine (have incontinence). °· The end of the stent comes out of your urethra. °· You cannot urinate. °· You have sudden, sharp, or severe pain in your abdomen or lower back. °· You have a fever. °This information is not intended to replace advice given to you by your health care provider. Make sure you discuss any questions you have with your health care provider. °Document Released: 08/18/2013 Document Revised: 05/23/2016 Document Reviewed: 06/30/2015 °Elsevier Interactive Patient Education © 2018 Else[employer] Inc[employer] °PATIENT INSTRUCTIONS °POST-ANESTHESIA ° °IMMEDIATELY FOLLOWING SURGERY:  Do not drive or operate machinery for the first twenty four hours after surgery.  Do not make any important decisions for twenty four hours after surgery or while taking narcotic pain medications or sedatives.  If you develop intractable nausea and vomiting or a severe headache please notify your doctor immediately. ° °FOLLOW-UP:  Please make an appointment with your surgeon as instructed. You do not need to follow up with anesthesia unless specifically instructed to do so. ° °  WOUND CARE INSTRUCTIONS (if applicable):  Keep a dry clean dressing on the anesthesia/puncture wound site if there is drainage.  Once the wound has quit draining you may leave it open to air.  Generally you should leave the bandage intact for twenty four hours unless there is drainage.  If the epidural site drains for more than 36-48 hours please call the anesthesia  department. ° °QUESTIONS?:  Please feel free to call your physician or the hospital operator if you have any questions, and they will be happy to assist you.   ° ° °  ° ° ° °

## 2017-10-13 NOTE — Anesthesia Preprocedure Evaluation (Signed)
Anesthesia Evaluation  Patient identified by MRN, date of birth, ID band Patient awake    Reviewed: Allergy & Precautions, NPO status , Patient's Chart, lab work & pertinent test results  History of Anesthesia Complications (+) PONV and history of anesthetic complications  Airway Mallampati: I  TM Distance: >3 FB     Dental  (+) Edentulous Upper, Partial Lower   Pulmonary sleep apnea , former smoker,    breath sounds clear to auscultation       Cardiovascular hypertension, Pt. on medications + CAD, + CABG and + Peripheral Vascular Disease   Rhythm:Regular Rate:Normal     Neuro/Psych PSYCHIATRIC DISORDERS Depression  Neuromuscular disease    GI/Hepatic hiatal hernia, GERD  Controlled and Medicated,  Endo/Other  diabetes, Type 2  Renal/GU Renal disease     Musculoskeletal  (+) Arthritis ,   Abdominal   Peds  Hematology  (+) anemia ,   Anesthesia Other Findings   Reproductive/Obstetrics                             Anesthesia Physical Anesthesia Plan  ASA: III  Anesthesia Plan: General   Post-op Pain Management:    Induction: Intravenous  PONV Risk Score and Plan:   Airway Management Planned: LMA  Additional Equipment:   Intra-op Plan:   Post-operative Plan: Extubation in OR  Informed Consent: I have reviewed the patients History and Physical, chart, labs and discussed the procedure including the risks, benefits and alternatives for the proposed anesthesia with the patient or authorized representative who has indicated his/her understanding and acceptance.     Plan Discussed with:   Anesthesia Plan Comments:         Anesthesia Quick Evaluation

## 2017-10-13 NOTE — Transfer of Care (Signed)
Immediate Anesthesia Transfer of Care Note  Patient: Vick Frees  Procedure(s) Performed: CYSTOSCOPY WITH LEFT RETROGRADE PYELOGRAM/LEFT URETERAL STENT REPLACEMENT (Left )  Patient Location: PACU  Anesthesia Type:General  Level of Consciousness: awake, oriented and patient cooperative  Airway & Oxygen Therapy: Patient Spontanous Breathing and Patient connected to nasal cannula oxygen  Post-op Assessment: Report given to RN and Post -op Vital signs reviewed and stable  Post vital signs: Reviewed and stable  Last Vitals:  Vitals:   10/13/17 1045 10/13/17 1100  BP: (!) 137/59 (!) 141/71  Resp: (!) 25 (!) 24  Temp:    SpO2: 100% 100%    Last Pain:  Vitals:   10/13/17 0906  TempSrc: Oral      Patients Stated Pain Goal: 6 (44/45/84 8350)  Complications: No apparent anesthesia complications

## 2017-10-13 NOTE — H&P (Signed)
Urology Admission H&P  Chief Complaint: left ureteral stricture  History of Present Illness: [name] is a 83yo with a hx of left ureteral stricture managed with a left indwelling ureteral stent  Past Medical History:  Diagnosis Date  . AAA (abdominal aortic aneurysm) ([place])   . Anemia   . Arthritis   . B12 deficiency   . Blood transfusion without reported diagnosis   . CAD (coronary artery disease)   . Cancer [facility])    radiation for prostate cancer and lupron.  . Cataract   . Complication of anesthesia    sts,"after hearat surgery my stomach didnt wake up like it was supossed to".  . Depression   . Diverticulitis   . DMII (diabetes mellitus, type 2) ([place]) 2008  . GERD (gastroesophageal reflux disease)   . GI bleed   . GIB (gastrointestinal bleeding) 09/18/2016  . Hiatal hernia   . History of kidney stones   . HTN (hypertension)   . Hyperlipidemia   . Iron deficiency anemia due to chronic blood loss 01/08/2017  . Kidney stone   . PONV (postoperative nausea and vomiting)   . Prostate cancer ([place])   . Sleep apnea    cannot tolerate, PCP aware  . Ureteral stricture, left    Past Surgical History:  Procedure Laterality Date  . APPENDECTOMY    . BACK SURGERY     x2  . CATARACT EXTRACTION Left   . CHOLECYSTECTOMY    . COLONOSCOPY  03/2015   Dr. Britta Mccreedy: Diverticulosis, single tubular adenoma removed.  . COLONOSCOPY WITH PROPOFOL N/A 02/25/2017   Dr. Oneida Alar: non-thrombosed external hemorrhoids, significant looping of left colon. severe diverticulosis in recto-sigmoid colon and sigmoid colon. 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 CORONARY ARTERY BYPASS GRAFT     1995  . CYSTOSCOPY     with stent-left kidney. x2  . CYSTOSCOPY W/ URETERAL STENT PLACEMENT Left 05/14/2017   Procedure: CYSTOSCOPY WITH RETROGRADE PYELOGRAM/URETERAL STENT EXCHANGE;  Surgeon: Cleon Gustin,  MD;  Location: [facility];  Service: Urology;  Laterality: Left;  . CYSTOSCOPY WITH FULGERATION  08/13/2017   Procedure: CYSTOSCOPY WITH FULGERATION OF BLADDER;  Surgeon: Cleon Gustin, MD;  Location: [facility];  Service: Urology;;  . Consuela Mimes WITH RETROGRADE PYELOGRAM, URETEROSCOPY AND STENT PLACEMENT Left 08/13/2017   Procedure: CYSTOSCOPY WITH LEFT RETROGRADE PYELOGRAM, LEFT URETEROSCOPY AND STENT REPLACEMENT;  Surgeon: Cleon Gustin, MD;  Location: [facility];  Service: Urology;  Laterality: Left;  . DESCENDING AORTIC ANEURYSM REPAIR W/ STENT     x2  . ESOPHAGOGASTRODUODENOSCOPY N/A 07/30/2016   Dr. Oneida Alar. Nonobstructing Schatzki ring at GE junction, multiple small sessile polyps with no bleeding or stigmata of recent bleeding in the gastric fundus and gastric body. Benign-appearing intrinsic moderate stenosis found the pylorus status post dilation. Small bowel capsule deployed.  Marland Kitchen GIVENS CAPSULE STUDY     Capsule study is complete to the cecum. No obvious lesions, mass, tumors. Small wisps of blood noted in small bowel secondary to EGD/dilation. Possible few erosions in setting of NSAIDs. No obvious areas of bleeding.  Marland Kitchen HERNIA REPAIR    . HOLMIUM LASER APPLICATION Left 07/08/6268   Procedure: HOLMIUM LASER LITHOTRIPSY OF ENCRUSTED LEFT URETERAL STENT;  Surgeon: Cleon Gustin, MD;  Location: [facility];  Service: Urology;  Laterality: Left;  . SPINE SURGERY     ruptured discs    Home Medications:  Prescriptions Prior to Admission  Medication  Sig Dispense Refill Last Dose  . aspirin EC 81 MG tablet Take 1 tablet (81 mg total) by mouth daily.   10/11/2017  . cholecalciferol (VITAMIN D) 1000 units tablet Take 1,000 Units by mouth 2 (two) times daily.    10/12/2017 at Unknown time  . Cyanocobalamin 1000 MCG/ML LIQD Place 1 mL under the tongue daily.    10/12/2017 at Unknown time  . finasteride (PROSCAR) 5 MG tablet Take 5 mg by mouth daily.   10/12/2017 at Unknown time  . glimepiride  (AMARYL) 2 MG tablet Take 2 mg by mouth 2 (two) times daily.    10/12/2017 at Unknown time  . nitroGLYCERIN (NITROSTAT) 0.4 MG SL tablet Place 1 tablet (0.4 mg total) under the tongue every 5 (five) minutes as needed for chest pain. 25 tablet 3 10/12/2017 at Unknown time  . omeprazole (PRILOSEC) 20 MG capsule Take 1 capsule (20 mg total) by mouth 2 (two) times daily before a meal. (Patient taking differently: Take 20 mg by mouth daily. ) 60 capsule 0 10/12/2017 at Unknown time  . sertraline (ZOLOFT) 50 MG tablet Take 50 mg by mouth daily.   10/12/2017 at Unknown time  . simvastatin (ZOCOR) 80 MG tablet Take 80 mg by mouth at bedtime.   10/12/2017 at Unknown time  . tamsulosin (FLOMAX) 0.4 MG CAPS Take 0.4 mg by mouth daily after supper.   10/12/2017 at Unknown time   Allergies:  Allergies  Allergen Reactions  . Penicillins Rash and Other (See Comments)    Happened in [phone] Has patient had a PCN reaction causing immediate rash, facial/tongue/throat swelling, SOB or lightheaddness with hypotension: Yes  Has patient had a PCN reaction causing severe rash involving mucus membranes or skin necrosis: Unknown Has patient had a PCN reaction that required hospitalization: No Has patient had a PCN reaction occurring within the last 10 years: No If all of the above answers are "NO", then may proceed with Cephalosporin use.    Family History  Problem Relation Age of Onset  . Heart disease Unknown   . Diabetes Unknown   . Hypertension Unknown   . Liver cancer Mother   . Arthritis Mother   . Cancer Mother        died at 76  . Heart disease Mother   . Alzheimer's disease Father   . Cancer Brother        lung  . Cancer Son 38       lung  . Hepatitis Brother   . Alcohol abuse Brother   . Heart disease Son 47       heart attack, stents  . Diabetes Son   . Colon cancer Neg Hx    Social History:  reports that he quit smoking about 23 years ago. His smoking use included Cigarettes. He started  smoking about 66 years ago. He has a 135.00 pack-year smoking history. He has never used smokeless tobacco. He reports that he drinks alcohol. He reports that he does not use drugs.  Review of Systems  Genitourinary: Positive for frequency, hematuria and urgency.  All other systems reviewed and are negative.   Physical Exam:  Vital signs in last 24 hours: Temp:  [97.7 F (36.5 C)] 97.7 F (36.5 C) (10/15 0906) Resp:  [22-47] 31 (10/15 1015) BP: (151-155)/(61-75) 153/61 (10/15 1015) SpO2:  [91 %-96 %] 91 % (10/15 1015) Physical Exam  Constitutional: He is oriented to person, place, and time. He appears well-developed and well-nourished.  HENT:  Head:  Normocephalic and atraumatic.  Eyes: Pupils are equal, round, and reactive to light. EOM are normal.  Neck: Normal range of motion. No thyromegaly present.  Cardiovascular: Normal rate and regular rhythm.   Respiratory: Effort normal. No respiratory distress.  GI: Soft. He exhibits no distension.  Musculoskeletal: Normal range of motion. He exhibits no edema.  Neurological: He is alert and oriented to person, place, and time.  Skin: Skin is warm and dry.  Psychiatric: He has a normal mood and affect. His behavior is normal. Judgment and thought content normal.    Laboratory Data:  Results for orders placed or performed during the hospital encounter of 10/13/17 (from the past 24 hour(s))  Glucose, capillary     Status: Abnormal   Collection Time: 10/13/17  9:41 AM  Result Value Ref Range   Glucose-Capillary 222 (H) 65 - 99 mg/dL   No results found for this or any previous visit (from the past 240 hour(s)). Creatinine:  Recent Labs  10/08/17 1134  CREATININE 1.82*   Baseline Creatinine: 1.8  Impression/Assessment:  83yo with left ureteral stricture managed with chronic indwelling stent  Plan:  The risks/benefits/alternatives to left stent exchange was explained tot he patient and he understands and wishes to proceed withs  Ilda Foil 10/13/2017, 11:01 AM

## 2017-10-13 NOTE — Anesthesia Postprocedure Evaluation (Signed)
Anesthesia Post Note  Patient: Andrew Zhang  Procedure(s) Performed: CYSTOSCOPY WITH LEFT RETROGRADE PYELOGRAM/LEFT URETERAL STENT REPLACEMENT (Left )  Anesthesia Type: General Level of consciousness: awake and alert, oriented and patient cooperative Pain management: pain level controlled Vital Signs Assessment: post-procedure vital signs reviewed and stable Respiratory status: spontaneous breathing Cardiovascular status: stable Postop Assessment: no apparent nausea or vomiting Anesthetic complications: no     Last Vitals:  Vitals:   10/13/17 1300 10/13/17 1316  BP: (!) 162/69 (!) 163/72  Pulse: 69 70  Resp: 17 18  Temp:  (!) 36.4 C  SpO2: 98% 99%    Last Pain:  Vitals:   10/13/17 1316  TempSrc: Oral                 Sybel Standish A

## 2017-10-14 ENCOUNTER — Encounter (HOSPITAL_COMMUNITY): Payer: Self-pay | Admitting: Urology

## 2017-10-21 DIAGNOSIS — C61 Malignant neoplasm of prostate: Secondary | ICD-10-CM | POA: Diagnosis not present

## 2017-10-29 ENCOUNTER — Ambulatory Visit (INDEPENDENT_AMBULATORY_CARE_PROVIDER_SITE_OTHER): Payer: Medicare Other | Admitting: Urology

## 2017-10-29 DIAGNOSIS — N131 Hydronephrosis with ureteral stricture, not elsewhere classified: Secondary | ICD-10-CM

## 2017-10-29 DIAGNOSIS — R310 Gross hematuria: Secondary | ICD-10-CM

## 2017-11-06 ENCOUNTER — Ambulatory Visit: Payer: Medicare Other | Admitting: Gastroenterology

## 2017-11-28 ENCOUNTER — Other Ambulatory Visit: Payer: Self-pay

## 2017-11-28 ENCOUNTER — Encounter (HOSPITAL_COMMUNITY): Payer: Medicare Other | Attending: Oncology

## 2017-11-28 ENCOUNTER — Encounter (HOSPITAL_COMMUNITY): Payer: Self-pay | Admitting: Emergency Medicine

## 2017-11-28 ENCOUNTER — Inpatient Hospital Stay (HOSPITAL_COMMUNITY)
Admission: EM | Admit: 2017-11-28 | Discharge: 2017-11-30 | DRG: 378 | Disposition: A | Discharge status: Home / Self Care | Payer: Medicare Other | Attending: Family Medicine | Admitting: Family Medicine

## 2017-11-28 DIAGNOSIS — E861 Hypovolemia: Secondary | ICD-10-CM | POA: Diagnosis present

## 2017-11-28 DIAGNOSIS — Z800 Family history of malignant neoplasm of digestive organs: Secondary | ICD-10-CM

## 2017-11-28 DIAGNOSIS — K222 Esophageal obstruction: Secondary | ICD-10-CM | POA: Diagnosis present

## 2017-11-28 DIAGNOSIS — K627 Radiation proctitis: Secondary | ICD-10-CM | POA: Diagnosis present

## 2017-11-28 DIAGNOSIS — K219 Gastro-esophageal reflux disease without esophagitis: Secondary | ICD-10-CM | POA: Diagnosis present

## 2017-11-28 DIAGNOSIS — Z833 Family history of diabetes mellitus: Secondary | ICD-10-CM

## 2017-11-28 DIAGNOSIS — Z9049 Acquired absence of other specified parts of digestive tract: Secondary | ICD-10-CM

## 2017-11-28 DIAGNOSIS — G4730 Sleep apnea, unspecified: Secondary | ICD-10-CM | POA: Diagnosis present

## 2017-11-28 DIAGNOSIS — E538 Deficiency of other specified B group vitamins: Secondary | ICD-10-CM | POA: Diagnosis present

## 2017-11-28 DIAGNOSIS — K317 Polyp of stomach and duodenum: Secondary | ICD-10-CM | POA: Diagnosis present

## 2017-11-28 DIAGNOSIS — E785 Hyperlipidemia, unspecified: Secondary | ICD-10-CM | POA: Diagnosis present

## 2017-11-28 DIAGNOSIS — N189 Chronic kidney disease, unspecified: Secondary | ICD-10-CM

## 2017-11-28 DIAGNOSIS — I10 Essential (primary) hypertension: Secondary | ICD-10-CM | POA: Diagnosis not present

## 2017-11-28 DIAGNOSIS — I129 Hypertensive chronic kidney disease with stage 1 through stage 4 chronic kidney disease, or unspecified chronic kidney disease: Secondary | ICD-10-CM | POA: Diagnosis present

## 2017-11-28 DIAGNOSIS — I441 Atrioventricular block, second degree: Secondary | ICD-10-CM | POA: Diagnosis present

## 2017-11-28 DIAGNOSIS — K5730 Diverticulosis of large intestine without perforation or abscess without bleeding: Secondary | ICD-10-CM | POA: Diagnosis present

## 2017-11-28 DIAGNOSIS — I447 Left bundle-branch block, unspecified: Secondary | ICD-10-CM | POA: Diagnosis present

## 2017-11-28 DIAGNOSIS — Z923 Personal history of irradiation: Secondary | ICD-10-CM | POA: Diagnosis not present

## 2017-11-28 DIAGNOSIS — Z7982 Long term (current) use of aspirin: Secondary | ICD-10-CM | POA: Diagnosis not present

## 2017-11-28 DIAGNOSIS — C61 Malignant neoplasm of prostate: Secondary | ICD-10-CM | POA: Diagnosis present

## 2017-11-28 DIAGNOSIS — K311 Adult hypertrophic pyloric stenosis: Secondary | ICD-10-CM | POA: Diagnosis present

## 2017-11-28 DIAGNOSIS — E119 Type 2 diabetes mellitus without complications: Secondary | ICD-10-CM | POA: Diagnosis not present

## 2017-11-28 DIAGNOSIS — Z8261 Family history of arthritis: Secondary | ICD-10-CM

## 2017-11-28 DIAGNOSIS — I25810 Atherosclerosis of coronary artery bypass graft(s) without angina pectoris: Secondary | ICD-10-CM | POA: Diagnosis present

## 2017-11-28 DIAGNOSIS — Z8249 Family history of ischemic heart disease and other diseases of the circulatory system: Secondary | ICD-10-CM

## 2017-11-28 DIAGNOSIS — R233 Spontaneous ecchymoses: Secondary | ICD-10-CM | POA: Diagnosis present

## 2017-11-28 DIAGNOSIS — K5520 Angiodysplasia of colon without hemorrhage: Secondary | ICD-10-CM | POA: Diagnosis present

## 2017-11-28 DIAGNOSIS — Z9842 Cataract extraction status, left eye: Secondary | ICD-10-CM

## 2017-11-28 DIAGNOSIS — Z8546 Personal history of malignant neoplasm of prostate: Secondary | ICD-10-CM | POA: Diagnosis not present

## 2017-11-28 DIAGNOSIS — E1122 Type 2 diabetes mellitus with diabetic chronic kidney disease: Secondary | ICD-10-CM | POA: Diagnosis present

## 2017-11-28 DIAGNOSIS — N183 Chronic kidney disease, stage 3 (moderate): Secondary | ICD-10-CM | POA: Diagnosis not present

## 2017-11-28 DIAGNOSIS — Z820 Family history of epilepsy and other diseases of the nervous system: Secondary | ICD-10-CM

## 2017-11-28 DIAGNOSIS — I25708 Atherosclerosis of coronary artery bypass graft(s), unspecified, with other forms of angina pectoris: Secondary | ICD-10-CM | POA: Diagnosis not present

## 2017-11-28 DIAGNOSIS — Z87891 Personal history of nicotine dependence: Secondary | ICD-10-CM

## 2017-11-28 DIAGNOSIS — K921 Melena: Secondary | ICD-10-CM | POA: Diagnosis present

## 2017-11-28 DIAGNOSIS — Z79899 Other long term (current) drug therapy: Secondary | ICD-10-CM

## 2017-11-28 DIAGNOSIS — E1140 Type 2 diabetes mellitus with diabetic neuropathy, unspecified: Secondary | ICD-10-CM

## 2017-11-28 DIAGNOSIS — D500 Iron deficiency anemia secondary to blood loss (chronic): Secondary | ICD-10-CM | POA: Diagnosis present

## 2017-11-28 DIAGNOSIS — N179 Acute kidney failure, unspecified: Secondary | ICD-10-CM | POA: Diagnosis present

## 2017-11-28 DIAGNOSIS — Z951 Presence of aortocoronary bypass graft: Secondary | ICD-10-CM

## 2017-11-28 DIAGNOSIS — Z811 Family history of alcohol abuse and dependence: Secondary | ICD-10-CM

## 2017-11-28 DIAGNOSIS — Z880 Allergy status to penicillin: Secondary | ICD-10-CM

## 2017-11-28 DIAGNOSIS — Z79891 Long term (current) use of opiate analgesic: Secondary | ICD-10-CM

## 2017-11-28 DIAGNOSIS — K644 Residual hemorrhoidal skin tags: Secondary | ICD-10-CM | POA: Diagnosis present

## 2017-11-28 DIAGNOSIS — D649 Anemia, unspecified: Secondary | ICD-10-CM

## 2017-11-28 DIAGNOSIS — Z87442 Personal history of urinary calculi: Secondary | ICD-10-CM

## 2017-11-28 DIAGNOSIS — M549 Dorsalgia, unspecified: Secondary | ICD-10-CM | POA: Diagnosis present

## 2017-11-28 LAB — CBC WITH DIFFERENTIAL/PLATELET
BASOS ABS: 0 10*3/uL (ref 0.0–0.1)
Basophils Absolute: 0 10*3/uL (ref 0.0–0.1)
Basophils Relative: 0 %
Basophils Relative: 0 %
EOS ABS: 0.2 10*3/uL (ref 0.0–0.7)
EOS ABS: 0.2 10*3/uL (ref 0.0–0.7)
EOS PCT: 4 %
Eosinophils Relative: 3 %
HCT: 23.4 % — ABNORMAL LOW (ref 39.0–52.0)
HEMATOCRIT: 23.4 % — AB (ref 39.0–52.0)
HEMOGLOBIN: 7 g/dL — AB (ref 13.0–17.0)
Hemoglobin: 7 g/dL — ABNORMAL LOW (ref 13.0–17.0)
LYMPHS ABS: 1 10*3/uL (ref 0.7–4.0)
LYMPHS PCT: 20 %
Lymphocytes Relative: 18 %
Lymphs Abs: 1 10*3/uL (ref 0.7–4.0)
MCH: 29 pg (ref 26.0–34.0)
MCH: 29 pg (ref 26.0–34.0)
MCHC: 29.9 g/dL — ABNORMAL LOW (ref 30.0–36.0)
MCHC: 29.9 g/dL — AB (ref 30.0–36.0)
MCV: 97.1 fL (ref 78.0–100.0)
MCV: 97.1 fL (ref 78.0–100.0)
MONO ABS: 0.4 10*3/uL (ref 0.1–1.0)
MONOS PCT: 8 %
Monocytes Absolute: 0.4 10*3/uL (ref 0.1–1.0)
Monocytes Relative: 7 %
NEUTROS ABS: 3.4 10*3/uL (ref 1.7–7.7)
NEUTROS PCT: 69 %
Neutro Abs: 4 10*3/uL (ref 1.7–7.7)
Neutrophils Relative %: 71 %
PLATELETS: 156 10*3/uL (ref 150–400)
Platelets: 147 10*3/uL — ABNORMAL LOW (ref 150–400)
RBC: 2.41 MIL/uL — AB (ref 4.22–5.81)
RBC: 2.41 MIL/uL — AB (ref 4.22–5.81)
RDW: 14.9 % (ref 11.5–15.5)
RDW: 15 % (ref 11.5–15.5)
WBC: 5.6 10*3/uL (ref 4.0–10.5)
WBC: 5 10*3/uL (ref 4.0–10.5)

## 2017-11-28 LAB — TROPONIN I: Troponin I: <0.03 ng/mL (ref <0.03)

## 2017-11-28 LAB — COMPREHENSIVE METABOLIC PANEL
ALT: 23 U/L (ref 17–63)
ALT: 22 U/L (ref 17–63)
ANION GAP: 8 (ref 5–15)
AST: 26 U/L (ref 15–41)
AST: 27 U/L (ref 15–41)
Albumin: 3.2 g/dL — ABNORMAL LOW (ref 3.5–5.0)
Albumin: 3.3 g/dL — ABNORMAL LOW (ref 3.5–5.0)
Alkaline Phosphatase: 59 U/L (ref 38–126)
Alkaline Phosphatase: 62 U/L (ref 38–126)
Anion gap: 8 (ref 5–15)
BUN: 36 mg/dL — ABNORMAL HIGH (ref 6–20)
BUN: 38 mg/dL — ABNORMAL HIGH (ref 6–20)
CHLORIDE: 106 mmol/L (ref 101–111)
CHLORIDE: 106 mmol/L (ref 101–111)
CO2: 25 mmol/L (ref 22–32)
CO2: 25 mmol/L (ref 22–32)
CREATININE: 3.09 mg/dL — AB (ref 0.61–1.24)
CREATININE: 3.02 mg/dL — AB (ref 0.61–1.24)
Calcium: 8.7 mg/dL — ABNORMAL LOW (ref 8.9–10.3)
Calcium: 8.7 mg/dL — ABNORMAL LOW (ref 8.9–10.3)
GFR calc non Af Amer: 18 mL/min — ABNORMAL LOW (ref >60)
GFR, EST AFRICAN AMERICAN: 20 mL/min — AB (ref >60)
GFR, EST AFRICAN AMERICAN: 21 mL/min — AB (ref >60)
GFR, EST NON AFRICAN AMERICAN: 17 mL/min — AB (ref >60)
Glucose, Bld: 124 mg/dL — ABNORMAL HIGH (ref 65–99)
Glucose, Bld: 157 mg/dL — ABNORMAL HIGH (ref 65–99)
POTASSIUM: 4.2 mmol/L (ref 3.5–5.1)
POTASSIUM: 3.9 mmol/L (ref 3.5–5.1)
SODIUM: 139 mmol/L (ref 135–145)
SODIUM: 139 mmol/L (ref 135–145)
Total Bilirubin: 0.7 mg/dL (ref 0.3–1.2)
Total Bilirubin: 0.3 mg/dL (ref 0.3–1.2)
Total Protein: 7 g/dL (ref 6.5–8.1)
Total Protein: 6.9 g/dL (ref 6.5–8.1)

## 2017-11-28 LAB — POC OCCULT BLOOD, ED: Fecal Occult Bld: POSITIVE — AB

## 2017-11-28 LAB — FERRITIN: FERRITIN: 17 ng/mL — AB (ref 24–336)

## 2017-11-28 LAB — IRON AND TIBC
IRON: 21 ug/dL — AB (ref 45–182)
SATURATION RATIOS: 6 % — AB (ref 17.9–39.5)
TIBC: 351 ug/dL (ref 250–450)
UIBC: 330 ug/dL

## 2017-11-28 LAB — PREPARE RBC (CROSSMATCH)

## 2017-11-28 LAB — GLUCOSE, CAPILLARY: GLUCOSE-CAPILLARY: 102 mg/dL — AB (ref 65–99)

## 2017-11-28 MED ORDER — TAMSULOSIN HCL 0.4 MG PO CAPS
0.4000 mg | ORAL_CAPSULE | Freq: Every day | ORAL | Status: DC
  Administered 2017-11-28 – 2017-11-30 (×3): 0.4 mg via ORAL
  Filled 2017-11-28 (×3): qty 1

## 2017-11-28 MED ORDER — SODIUM CHLORIDE 0.9 % IV SOLN
10.0000 mL/h | Freq: Once | INTRAVENOUS | Status: AC
  Administered 2017-11-28: 10 mL/h via INTRAVENOUS

## 2017-11-28 MED ORDER — PANTOPRAZOLE SODIUM 40 MG IV SOLR
40.0000 mg | Freq: Two times a day (BID) | INTRAVENOUS | Status: DC
  Administered 2017-11-29 – 2017-11-30 (×3): 40 mg via INTRAVENOUS
  Filled 2017-11-28 (×3): qty 40

## 2017-11-28 MED ORDER — ACETAMINOPHEN 650 MG RE SUPP: 650.0000 mg | Freq: Four times a day (QID) | RECTAL | Status: DC | PRN

## 2017-11-28 MED ORDER — ATORVASTATIN CALCIUM 40 MG PO TABS
40.0000 mg | ORAL_TABLET | Freq: Every day | ORAL | Status: DC
  Administered 2017-11-28 – 2017-11-30 (×3): 40 mg via ORAL
  Filled 2017-11-28 (×3): qty 1

## 2017-11-28 MED ORDER — ONDANSETRON HCL 4 MG/2ML IJ SOLN: 4.0000 mg | Freq: Four times a day (QID) | INTRAMUSCULAR | Status: DC | PRN

## 2017-11-28 MED ORDER — INSULIN ASPART 100 UNIT/ML ~~LOC~~ SOLN
0.0000 [IU] | SUBCUTANEOUS | Status: DC
  Administered 2017-11-29: 1 [IU] via SUBCUTANEOUS
  Administered 2017-11-29 – 2017-11-30 (×3): 2 [IU] via SUBCUTANEOUS
  Administered 2017-11-30: 1 [IU] via SUBCUTANEOUS

## 2017-11-28 MED ORDER — FINASTERIDE 5 MG PO TABS
5.0000 mg | ORAL_TABLET | Freq: Every day | ORAL | Status: DC
  Administered 2017-11-29 – 2017-11-30 (×2): 5 mg via ORAL
  Filled 2017-11-28 (×3): qty 1

## 2017-11-28 MED ORDER — SODIUM CHLORIDE 0.9% FLUSH
3.0000 mL | Freq: Two times a day (BID) | INTRAVENOUS | Status: DC
  Administered 2017-11-28 – 2017-11-30 (×3): 3 mL via INTRAVENOUS

## 2017-11-28 MED ORDER — VITAMIN D 1000 UNITS PO TABS
1000.0000 [IU] | ORAL_TABLET | Freq: Two times a day (BID) | ORAL | Status: DC
  Administered 2017-11-28 – 2017-11-30 (×4): 1000 [IU] via ORAL
  Filled 2017-11-28 (×10): qty 1

## 2017-11-28 MED ORDER — ACETAMINOPHEN 325 MG PO TABS: 650.0000 mg | ORAL_TABLET | Freq: Four times a day (QID) | ORAL | Status: DC | PRN

## 2017-11-28 MED ORDER — HYDROCODONE-ACETAMINOPHEN 5-325 MG PO TABS: 1.0000 | ORAL_TABLET | Freq: Four times a day (QID) | ORAL | Status: DC | PRN

## 2017-11-28 MED ORDER — SODIUM CHLORIDE 0.9 % IV SOLN
INTRAVENOUS | Status: AC
  Administered 2017-11-29: 01:00:00 via INTRAVENOUS

## 2017-11-28 MED ORDER — VITAMIN B-12 1000 MCG PO TABS
1000.0000 ug | ORAL_TABLET | Freq: Every day | ORAL | Status: DC
  Administered 2017-11-29 – 2017-11-30 (×2): 1000 ug via ORAL
  Filled 2017-11-28 (×2): qty 1

## 2017-11-28 MED ORDER — ONDANSETRON HCL 4 MG PO TABS: 4.0000 mg | ORAL_TABLET | Freq: Four times a day (QID) | ORAL | Status: DC | PRN

## 2017-11-28 MED ORDER — SERTRALINE HCL 50 MG PO TABS
50.0000 mg | ORAL_TABLET | Freq: Every day | ORAL | Status: DC
  Administered 2017-11-29 – 2017-11-30 (×2): 50 mg via ORAL
  Filled 2017-11-28 (×2): qty 1

## 2017-11-28 NOTE — ED Provider Notes (Signed)
[facility] EMERGENCY DEPARTMENT Provider Note   CSN: [id number] Arrival date & time: 11/28/17  1634     History   Chief Complaint Chief Complaint  Patient presents with  . Abnormal Lab    HPI Andrew Zhang is a 83 y.o. male.  HPI  83 year old male presents with anemia.  His doctor sent him in after blood work today returned with a hemoglobin of 7, most recently was 12 1 month ago.  The patient has been feeling fatigued for about 1 week.  Some dizziness when getting up and walking.  Some shortness of breath more than typical.  He has chest pain and back pain when he rolls on his side but no chest pain with exertion.  No chest pain at rest.  He denies any abdominal pain.  No vomiting but has been having dark stools that he would qualify is black.  He denies any history of prior GI bleeds.  Previously he has required blood transfusions due to gross hematuria but has not seen any hematuria for months. On a baby aspirin, no other blood thinners.   Past Medical History:  Diagnosis Date  . AAA (abdominal aortic aneurysm) ([place])   . Anemia   . Arthritis   . B12 deficiency   . Blood transfusion without reported diagnosis   . CAD (coronary artery disease)   . Cancer [facility])    radiation for prostate cancer and lupron.  . Cataract   . Complication of anesthesia    sts,"after hearat surgery my stomach didnt wake up like it was supossed to".  . Depression   . Diverticulitis   . DMII (diabetes mellitus, type 2) ([place]) 2008  . GERD (gastroesophageal reflux disease)   . GI bleed   . GIB (gastrointestinal bleeding) 09/18/2016  . Hiatal hernia   . History of kidney stones   . HTN (hypertension)   . Hyperlipidemia   . Iron deficiency anemia due to chronic blood loss 01/08/2017  . Kidney stone   . PONV (postoperative nausea and vomiting)   . Prostate cancer ([place])   . Sleep apnea    cannot tolerate, PCP aware  . Ureteral stricture, left     Patient Active Problem List   Diagnosis  Date Noted  . Acute blood loss anemia   . Iron deficiency anemia due to chronic blood loss 01/08/2017  . Melena   . Acute on chronic renal failure ([place]) 07/29/2016  . Adenocarcinoma of prostate ([employer]) 09/06/2014  . Type 2 diabetes mellitus without complications ([place]) 83/15/1761  . CAD (coronary artery disease) of artery bypass graft 08/27/2013  . HTN (hypertension) 08/27/2013  . Hyperlipidemia 08/27/2013  . S/P CABG x 3 08/27/2013  . Abdominal aortic aneurysm ([place]) 04/22/2013    Past Surgical History:  Procedure Laterality Date  . APPENDECTOMY    . BACK SURGERY     x2  . CATARACT EXTRACTION Left   . CHOLECYSTECTOMY    . COLONOSCOPY  03/2015   Dr. Britta Mccreedy: Diverticulosis, single tubular adenoma removed.  . COLONOSCOPY WITH PROPOFOL N/A 02/25/2017   Dr. Oneida Alar: non-thrombosed external hemorrhoids, significant looping of left colon. severe diverticulosis in recto-sigmoid colon and sigmoid colon. 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 CORONARY ARTERY BYPASS GRAFT     1995  . CYSTOSCOPY     with stent-left kidney. x2  . CYSTOSCOPY W/ URETERAL STENT PLACEMENT Left 05/14/2017   Procedure: CYSTOSCOPY WITH RETROGRADE PYELOGRAM/URETERAL STENT EXCHANGE;  Surgeon: Cleon Gustin, MD;  Location: [facility];  Service: Urology;  Laterality: Left;  . CYSTOSCOPY W/ URETERAL STENT PLACEMENT Left 10/13/2017   Procedure: CYSTOSCOPY WITH LEFT RETROGRADE PYELOGRAM/LEFT URETERAL STENT REPLACEMENT;  Surgeon: Cleon Gustin, MD;  Location: [facility];  Service: Urology;  Laterality: Left;  . CYSTOSCOPY WITH FULGERATION  08/13/2017   Procedure: CYSTOSCOPY WITH FULGERATION OF BLADDER;  Surgeon: Cleon Gustin, MD;  Location: [facility];  Service: Urology;;  . Consuela Mimes WITH RETROGRADE PYELOGRAM, URETEROSCOPY AND STENT PLACEMENT Left 08/13/2017   Procedure: CYSTOSCOPY WITH LEFT RETROGRADE PYELOGRAM, LEFT  URETEROSCOPY AND STENT REPLACEMENT;  Surgeon: Cleon Gustin, MD;  Location: [facility];  Service: Urology;  Laterality: Left;  . DESCENDING AORTIC ANEURYSM REPAIR W/ STENT     x2  . ESOPHAGOGASTRODUODENOSCOPY N/A 07/30/2016   Dr. Oneida Alar. Nonobstructing Schatzki ring at GE junction, multiple small sessile polyps with no bleeding or stigmata of recent bleeding in the gastric fundus and gastric body. Benign-appearing intrinsic moderate stenosis found the pylorus status post dilation. Small bowel capsule deployed.  Marland Kitchen GIVENS CAPSULE STUDY     Capsule study is complete to the cecum. No obvious lesions, mass, tumors. Small wisps of blood noted in small bowel secondary to EGD/dilation. Possible few erosions in setting of NSAIDs. No obvious areas of bleeding.  Marland Kitchen HERNIA REPAIR    . HOLMIUM LASER APPLICATION Left 2/54/2706   Procedure: HOLMIUM LASER LITHOTRIPSY OF ENCRUSTED LEFT URETERAL STENT;  Surgeon: Cleon Gustin, MD;  Location: [facility];  Service: Urology;  Laterality: Left;  . SPINE SURGERY     ruptured discs       Home Medications    Prior to Admission medications   Medication Sig Start Date End Date Taking? Authorizing Provider  aspirin EC 81 MG tablet Take 1 tablet (81 mg total) by mouth daily. 10/02/17   Herminio Commons, MD  cholecalciferol (VITAMIN D) 1000 units tablet Take 1,000 Units by mouth 2 (two) times daily.     [provider]  Cyanocobalamin 1000 MCG/ML LIQD Place 1 mL under the tongue daily.     [provider]  finasteride (PROSCAR) 5 MG tablet Take 5 mg by mouth daily.    [provider]  glimepiride (AMARYL) 2 MG tablet Take 2 mg by mouth 2 (two) times daily.     [provider]  HYDROcodone-acetaminophen (NORCO) 5-325 MG tablet Take 1 tablet by mouth every 6 (six) hours as needed for moderate pain. 10/13/17   McKenzie, Candee Furbish, MD  nitroGLYCERIN (NITROSTAT) 0.4 MG SL tablet Place 1 tablet (0.4 mg total) under the tongue every 5  (five) minutes as needed for chest pain. 04/05/16   Herminio Commons, MD  omeprazole (PRILOSEC) 20 MG capsule Take 1 capsule (20 mg total) by mouth 2 (two) times daily before a meal. Patient taking differently: Take 20 mg by mouth daily.  07/31/16   Samuella Cota, MD  sertraline (ZOLOFT) 50 MG tablet Take 50 mg by mouth daily.    [provider]  simvastatin (ZOCOR) 80 MG tablet Take 80 mg by mouth at bedtime.    [provider]  tamsulosin (FLOMAX) 0.4 MG CAPS Take 0.4 mg by mouth daily after supper.    [provider]    Family History Family History  Problem Relation Age of Onset  . Heart disease Unknown   . Diabetes Unknown   . Hypertension Unknown   . Liver cancer Mother   . Arthritis Mother   .  Cancer Mother        died at 43  . Heart disease Mother   . Alzheimer's disease Father   . Cancer Brother        lung  . Cancer Son 61       lung  . Hepatitis Brother   . Alcohol abuse Brother   . Heart disease Son 18       heart attack, stents  . Diabetes Son   . Colon cancer Neg Hx     Social History Social History   Tobacco Use  . Smoking status: Former Smoker    Packs/day: 3.00    Years: 45.00    Pack years: 135.00    Types: Cigarettes    Start date: 04/19/1949    Last attempt to quit: 03/01/1994    Years since quitting: 23.7  . Smokeless tobacco: Never Used  Substance Use Topics  . Alcohol use: Yes    Alcohol/week: 0.0 oz    Comment: 1-2 beers daily   . Drug use: No     Allergies   Penicillins   Review of Systems Review of Systems  Constitutional: Positive for fatigue.  Respiratory: Negative for shortness of breath.   Cardiovascular: Positive for chest pain.  Gastrointestinal: Negative for abdominal pain and vomiting.  Genitourinary: Negative for hematuria.  Musculoskeletal: Positive for back pain.  Neurological: Positive for dizziness.  All other systems reviewed and are negative.    Physical Exam Updated Vital  Signs BP (!) 129/45 (BP Location: Right Arm)   Pulse 84   Temp 98.2 F (36.8 C) (Oral)   Resp 18   Ht 6\' 2"  (1.88 m)   Wt 89.5 kg (197 lb 6.4 oz)   SpO2 100%   BMI 25.34 kg/m   Physical Exam  Constitutional: He is oriented to person, place, and time. He appears well-developed and well-nourished. No distress.  HENT:  Head: Normocephalic and atraumatic.  Right Ear: External ear normal.  Left Ear: External ear normal.  Nose: Nose normal.  Eyes: Right eye exhibits no discharge. Left eye exhibits no discharge.  Neck: Neck supple.  Cardiovascular: Normal rate, regular rhythm and normal heart sounds.  Pulmonary/Chest: Effort normal and breath sounds normal.  Abdominal: Soft. He exhibits no distension. There is no tenderness.  Genitourinary: Rectal exam shows external hemorrhoid (nonthrombosed) and guaiac positive stool (minimal light stool on finger during exam, occult positive).  Musculoskeletal: He exhibits no edema.  Neurological: He is alert and oriented to person, place, and time.  Skin: Skin is warm and dry. He is not diaphoretic. There is pallor.  Nursing note and vitals reviewed.    ED Treatments / Results  Labs (all labs ordered are listed, but only abnormal results are displayed) Labs Reviewed  COMPREHENSIVE METABOLIC PANEL - Abnormal; Notable for the following components:      Result Value   Glucose, Bld 157 (*)    BUN 38 (*)    Creatinine, Ser 3.02 (*)    Calcium 8.7 (*)    Albumin 3.3 (*)    GFR calc non Af Amer 18 (*)    GFR calc Af Amer 21 (*)    All other components within normal limits  CBC WITH DIFFERENTIAL/PLATELET - Abnormal; Notable for the following components:   RBC 2.41 (*)    Hemoglobin 7.0 (*)    HCT 23.4 (*)    MCHC 29.9 (*)    Platelets 147 (*)    All other components within normal limits  POC OCCULT BLOOD, ED - Abnormal; Notable for the following components:   Fecal Occult Bld POSITIVE (*)    All other components within normal limits    TROPONIN I  BASIC METABOLIC PANEL  URINALYSIS, COMPLETE (UACMP) WITH MICROSCOPIC  SODIUM, URINE, RANDOM  UREA NITROGEN, URINE  CREATININE, URINE, RANDOM  TYPE AND SCREEN  PREPARE RBC (CROSSMATCH)    EKG  EKG Interpretation None       Radiology No results found.  Procedures .Critical Care Performed by: Sherwood Gambler, MD Authorized by: Sherwood Gambler, MD   Critical care provider statement:    Critical care time (minutes):  30   Critical care was necessary to treat or prevent imminent or life-threatening deterioration of the following conditions:  Circulatory failure and renal failure   Critical care was time spent personally by me on the following activities:  Development of treatment plan with patient or surrogate, discussions with consultants, evaluation of patient's response to treatment, examination of patient, ordering and performing treatments and interventions, ordering and review of laboratory studies, ordering and review of radiographic studies, pulse oximetry, re-evaluation of patient's condition and review of old charts   (including critical care time)  Medications Ordered in ED Medications - No data to display   Initial Impression / Assessment and Plan / ED Course  I have reviewed the triage vital signs and the nursing notes.  Pertinent labs & imaging results that were available during my care of the patient were reviewed by me and considered in my medical decision making (see chart for details).     Patient with anemia with hemoglobin of 7, compared to 12 1 month ago.  He is quite symptomatic.  Given it is right on 7.0 and he positive stools, he will be treated with a unit of blood and admitted to the hospital for further GI workup. No abdominal pain.  His vital signs are stable.  Hospitalist to admit.  Final Clinical Impressions(s) / ED Diagnoses   Final diagnoses:  Symptomatic anemia    ED Discharge Orders    None       Sherwood Gambler,  MD 11/28/17 2129

## 2017-11-28 NOTE — H&P (Signed)
History and Physical    Andrew Zhang [id number] DOB: March 11, 1934 DOA: 11/28/2017  PCP: [name], MD   Patient coming from: Home  Chief Complaint: Lightheadedness, melena   HPI: Andrew Zhang is a 83 y.o. male with medical history significant for prostate cancer status post radiation, coronary artery disease, chronic kidney disease stage III, chronic anemia, and type 2 diabetes mellitus, now presenting to the emergency department for evaluation of lightheadedness and melena.  Patient reports that he had been in his usual state of health until the insidious development fatigue approximately 1 week ago.  He is also noted some lightheadedness upon standing over the same interval.  His chronic dyspnea is slightly worse than usual as well.  Patient noted chest pain in triage, but explains that this is only when he rolls onto his side and is associated with back pain as well, but denies any exertional component.  Patient takes a baby aspirin daily.  Denies any vomiting or abdominal pain, but reports some dark stools for the past month, but notes that it seemed to be lighter this morning.  Denies any hematuria.  ED Course: Upon arrival to the ED, patient is found to be afebrile, saturating well on room air, and with vitals otherwise stable.  EKG features a sinus rhythm with chronic left bundle branch block.  Chemistry panel reveals a BUN of 38 and creatinine of 3.02, up from 1.82 last month.  CBC is notable for a hemoglobin of 7.0, down from 12.0 last month.  Fecal occult blood testing is positive.  Type and screen was performed and 1 unit of packed red blood cells was ordered for immediate transfusion in the ED.  Patient remained hemodynamically stable, has not been in any apparent respiratory distress, and will be admitted to the telemetry unit for ongoing evaluation and management of melena with symptomatic anemia and acute on chronic renal insufficiency.  Review of Systems:  All other  systems reviewed and apart from HPI, are negative.  Past Medical History:  Diagnosis Date  . AAA (abdominal aortic aneurysm) ([place])   . Anemia   . Arthritis   . B12 deficiency   . Blood transfusion without reported diagnosis   . CAD (coronary artery disease)   . Cancer [facility])    radiation for prostate cancer and lupron.  . Cataract   . Complication of anesthesia    sts,"after hearat surgery my stomach didnt wake up like it was supossed to".  . Depression   . Diverticulitis   . DMII (diabetes mellitus, type 2) ([place]) 2008  . GERD (gastroesophageal reflux disease)   . GI bleed   . GIB (gastrointestinal bleeding) 09/18/2016  . Hiatal hernia   . History of kidney stones   . HTN (hypertension)   . Hyperlipidemia   . Iron deficiency anemia due to chronic blood loss 01/08/2017  . Kidney stone   . PONV (postoperative nausea and vomiting)   . Prostate cancer ([place])   . Sleep apnea    cannot tolerate, PCP aware  . Ureteral stricture, left     Past Surgical History:  Procedure Laterality Date  . APPENDECTOMY    . BACK SURGERY     x2  . CATARACT EXTRACTION Left   . CHOLECYSTECTOMY    . COLONOSCOPY  03/2015   Dr. Britta Mccreedy: Diverticulosis, single tubular adenoma removed.  . COLONOSCOPY WITH PROPOFOL N/A 02/25/2017   Dr. Oneida Alar: non-thrombosed external hemorrhoids, significant looping of left colon. severe diverticulosis in recto-sigmoid colon  and sigmoid colon. 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 CORONARY ARTERY BYPASS GRAFT     1995  . CYSTOSCOPY     with stent-left kidney. x2  . CYSTOSCOPY W/ URETERAL STENT PLACEMENT Left 05/14/2017   Procedure: CYSTOSCOPY WITH RETROGRADE PYELOGRAM/URETERAL STENT EXCHANGE;  Surgeon: Cleon Gustin, MD;  Location: [facility];  Service: Urology;  Laterality: Left;  . CYSTOSCOPY W/ URETERAL STENT PLACEMENT Left 10/13/2017   Procedure: CYSTOSCOPY  WITH LEFT RETROGRADE PYELOGRAM/LEFT URETERAL STENT REPLACEMENT;  Surgeon: Cleon Gustin, MD;  Location: [facility];  Service: Urology;  Laterality: Left;  . CYSTOSCOPY WITH FULGERATION  08/13/2017   Procedure: CYSTOSCOPY WITH FULGERATION OF BLADDER;  Surgeon: Cleon Gustin, MD;  Location: [facility];  Service: Urology;;  . Consuela Mimes WITH RETROGRADE PYELOGRAM, URETEROSCOPY AND STENT PLACEMENT Left 08/13/2017   Procedure: CYSTOSCOPY WITH LEFT RETROGRADE PYELOGRAM, LEFT URETEROSCOPY AND STENT REPLACEMENT;  Surgeon: Cleon Gustin, MD;  Location: [facility];  Service: Urology;  Laterality: Left;  . DESCENDING AORTIC ANEURYSM REPAIR W/ STENT     x2  . ESOPHAGOGASTRODUODENOSCOPY N/A 07/30/2016   Dr. Oneida Alar. Nonobstructing Schatzki ring at GE junction, multiple small sessile polyps with no bleeding or stigmata of recent bleeding in the gastric fundus and gastric body. Benign-appearing intrinsic moderate stenosis found the pylorus status post dilation. Small bowel capsule deployed.  Marland Kitchen GIVENS CAPSULE STUDY     Capsule study is complete to the cecum. No obvious lesions, mass, tumors. Small wisps of blood noted in small bowel secondary to EGD/dilation. Possible few erosions in setting of NSAIDs. No obvious areas of bleeding.  Marland Kitchen HERNIA REPAIR    . HOLMIUM LASER APPLICATION Left 1/49/7026   Procedure: HOLMIUM LASER LITHOTRIPSY OF ENCRUSTED LEFT URETERAL STENT;  Surgeon: Cleon Gustin, MD;  Location: [facility];  Service: Urology;  Laterality: Left;  . SPINE SURGERY     ruptured discs     reports that he quit smoking about 23 years ago. His smoking use included cigarettes. He started smoking about 66 years ago. He has a 135.00 pack-year smoking history. he has never used smokeless tobacco. He reports that he drinks alcohol. He reports that he does not use drugs.  Allergies  Allergen Reactions  . Penicillins Rash and Other (See Comments)    Happened in [phone] Has patient had a PCN reaction causing  immediate rash, facial/tongue/throat swelling, SOB or lightheaddness with hypotension: Yes  Has patient had a PCN reaction causing severe rash involving mucus membranes or skin necrosis: Unknown Has patient had a PCN reaction that required hospitalization: No Has patient had a PCN reaction occurring within the last 10 years: No If all of the above answers are "NO", then may proceed with Cephalosporin use.    Family History  Problem Relation Age of Onset  . Heart disease Unknown   . Diabetes Unknown   . Hypertension Unknown   . Liver cancer Mother   . Arthritis Mother   . Cancer Mother        died at 48  . Heart disease Mother   . Alzheimer's disease Father   . Cancer Brother        lung  . Cancer Son 34       lung  . Hepatitis Brother   . Alcohol abuse Brother   . Heart disease Son 36       heart attack, stents  . Diabetes Son   . Colon  cancer Neg Hx      Prior to Admission medications   Medication Sig Start Date End Date Taking? Authorizing Provider  aspirin EC 81 MG tablet Take 1 tablet (81 mg total) by mouth daily. 10/02/17   Herminio Commons, MD  cholecalciferol (VITAMIN D) 1000 units tablet Take 1,000 Units by mouth 2 (two) times daily.     [provider]  Cyanocobalamin 1000 MCG/ML LIQD Place 1 mL under the tongue daily.     [provider]  finasteride (PROSCAR) 5 MG tablet Take 5 mg by mouth daily.    [provider]  glimepiride (AMARYL) 2 MG tablet Take 2 mg by mouth 2 (two) times daily.     [provider]  HYDROcodone-acetaminophen (NORCO) 5-325 MG tablet Take 1 tablet by mouth every 6 (six) hours as needed for moderate pain. 10/13/17   McKenzie, Candee Furbish, MD  nitroGLYCERIN (NITROSTAT) 0.4 MG SL tablet Place 1 tablet (0.4 mg total) under the tongue every 5 (five) minutes as needed for chest pain. 04/05/16   Herminio Commons, MD  omeprazole (PRILOSEC) 20 MG capsule Take 1 capsule (20 mg total) by mouth 2 (two) times daily  before a meal. Patient taking differently: Take 20 mg by mouth daily.  07/31/16   Samuella Cota, MD  sertraline (ZOLOFT) 50 MG tablet Take 50 mg by mouth daily.    [provider]  simvastatin (ZOCOR) 80 MG tablet Take 80 mg by mouth at bedtime.    [provider]  tamsulosin (FLOMAX) 0.4 MG CAPS Take 0.4 mg by mouth daily after supper.    [provider]    Physical Exam: Vitals:   11/28/17 1655 11/28/17 1657 11/28/17 1800  BP: (!) 129/45  (!) 135/53  Pulse: 84  78  Resp: 18  20  Temp: 98.2 F (36.8 C)    TempSrc: Oral    SpO2: 100%  98%  Weight:  89.5 kg (197 lb 6.4 oz)   Height:  6\' 2"  (1.88 m)       Constitutional: NAD, calm, comfortable Eyes: PERTLA, lids and conjunctivae normal ENMT: Mucous membranes are moist. Posterior pharynx clear of any exudate or lesions.   Neck: normal, supple, no masses, no thyromegaly Respiratory: clear to auscultation bilaterally, no wheezing, no crackles. Normal respiratory effort.  Cardiovascular: S1 & S2 heard, regular rate and rhythm. No extremity edema. . No significant JVD. Abdomen: No distension, no tenderness, no masses palpated. Bowel sounds normal.  Musculoskeletal: no clubbing / cyanosis. No joint deformity upper and lower extremities.   Skin: no significant rashes, lesions, ulcers. Warm, dry, well-perfused. Neurologic: CN 2-12 grossly intact. Sensation intact. Strength 5/5 in all 4 limbs.  Psychiatric: Alert and oriented x 3. Pleasant and cooperative.     Labs on Admission: I have personally reviewed following labs and imaging studies  CBC: Recent Labs  Lab 11/28/17 1356 11/28/17 1723  WBC 5.6 5.0  NEUTROABS 4.0 3.4  HGB 7.0* 7.0*  HCT 23.4* 23.4*  MCV 97.1 97.1  PLT 156 323*   Basic Metabolic Panel: Recent Labs  Lab 11/28/17 1356 11/28/17 1723  NA 139 139  K 4.2 3.9  CL 106 106  CO2 25 25  GLUCOSE 124* 157*  BUN 36* 38*  CREATININE 3.09* 3.02*  CALCIUM 8.7* 8.7*    GFR: Estimated Creatinine Clearance: 22.3 mL/min (A) (by C-G formula based on SCr of 3.02 mg/dL (H)). Liver Function Tests: Recent Labs  Lab 11/28/17 1356 11/28/17 1723  AST 26 27  ALT 23 22  ALKPHOS 59 62  BILITOT 0.7 0.3  PROT 7.0 6.9  ALBUMIN 3.2* 3.3*   No results for input(s): LIPASE, AMYLASE in the last 168 hours. No results for input(s): AMMONIA in the last 168 hours. Coagulation Profile: No results for input(s): INR, PROTIME in the last 168 hours. Cardiac Enzymes: No results for input(s): CKTOTAL, CKMB, CKMBINDEX, TROPONINI in the last 168 hours. BNP (last 3 results) No results for input(s): PROBNP in the last 8760 hours. HbA1C: No results for input(s): HGBA1C in the last 72 hours. CBG: No results for input(s): GLUCAP in the last 168 hours. Lipid Profile: No results for input(s): CHOL, HDL, LDLCALC, TRIG, CHOLHDL, LDLDIRECT in the last 72 hours. Thyroid Function Tests: No results for input(s): TSH, T4TOTAL, FREET4, T3FREE, THYROIDAB in the last 72 hours. Anemia Panel: No results for input(s): VITAMINB12, FOLATE, FERRITIN, TIBC, IRON, RETICCTPCT in the last 72 hours. Urine analysis:    Component Value Date/Time   COLORURINE RED (A) 07/18/2017 1422   APPEARANCEUR TURBID (A) 07/18/2017 1422   LABSPEC 1.030 07/18/2017 1422   PHURINE 5.0 07/18/2017 1422   GLUCOSEU (A) 07/18/2017 1422    TEST NOT REPORTED DUE TO COLOR INTERFERENCE OF URINE PIGMENT   HGBUR (A) 07/18/2017 1422    TEST NOT REPORTED DUE TO COLOR INTERFERENCE OF URINE PIGMENT   BILIRUBINUR (A) 07/18/2017 1422    TEST NOT REPORTED DUE TO COLOR INTERFERENCE OF URINE PIGMENT   KETONESUR (A) 07/18/2017 1422    TEST NOT REPORTED DUE TO COLOR INTERFERENCE OF URINE PIGMENT   PROTEINUR (A) 07/18/2017 1422    TEST NOT REPORTED DUE TO COLOR INTERFERENCE OF URINE PIGMENT   NITRITE (A) 07/18/2017 1422    TEST NOT REPORTED DUE TO COLOR INTERFERENCE OF URINE PIGMENT   LEUKOCYTESUR (A) 07/18/2017 1422    TEST  NOT REPORTED DUE TO COLOR INTERFERENCE OF URINE PIGMENT   Sepsis Labs: @LABRCNTIP (procalcitonin:4,lacticidven:4) )No results found for this or any previous visit (from the past 240 hour(s)).   Radiological Exams on Admission: No results found.  EKG: Independently reviewed. Sinus rhythm, chronic LBBB, sinus pause.   Assessment/Plan  1. Symptomatic anemia; melena  - Pt presents with 1 wk of lightheadedness upon standing; reports melena x1 month with a lighter BM on the morning of admission - Hgb is found to be 7.0 on admission, down from 12 one month earlier  - EGD in Feb '18 without explanation for melena at that time; colonoscopy from Aug '17 with severe left-sided diverticulosis, radiation proctitis, and external hemorrhoids  - FOBT is positive, no abd px, nausea, or vomiting - No tachycardia or hypotension  - 1 unit pRBC ordered from ED  - Hold ASA, check post-transfusion CBC, start IV PPI, will ask GI to evaluate    2. Acute kidney injury superimposed on CKD stage III - SCr is 3.02 on admission, up from 1.82 one month earlier   - Likely prerenal azotemia in setting of clinical hypovolemia and blood-loss  - Plan to hydrate with NS and pRBCs, avoid nephrotoxins where possible, renally-dose medications, check renal US and urine chemistries, repeat chem panel in am    3. CAD - Pt reports chest and back pain when rolling onto his side, but denies any exertional chest pain  - EKG not significantly changed, chronic LBBB noted  - Plan to check troponin, transfuse as above, continue statin, hold ASA in light of GIB, continue cardiac monitoring    4. Type II  DM  - A1c was 6.1% in July 2017 - Managed with glimepiride only at home, held on admission  - Check CBG's and use a low-intensity SSI with Novolog     DVT prophylaxis: SCD's Code Status: Full  Family Communication: Discussed with patient Disposition Plan: Admit to telemetry Consults called: None Admission status: Inpatient     Vianne Bulls, MD [facility] Pager [phone]  If 7PM-7AM, please contact night-coverage [email or web address] Password [email or web address]  11/28/2017, 7:18 PM

## 2017-11-28 NOTE — ED Triage Notes (Addendum)
Patient sent here by PCP after having blood drawn at 2:30pm with HGB result of 7. Patient pale in color. Patient reports some intermittent dizziness and chest pain. Denies any at this time. Patient reports taking SL nitro 3 days ago with no relief. Per patient has had similar symptoms in past "when his blood got low." Patient does report having some black stools recently. Patient states "The last time I was losing blood was from my kidneys." Patient reports hx of multiple kidney stones with stents and prostate cancer.  Wife states 12 blood transfusions in past year.  Per patient takes 81mg  of aspirin.

## 2017-11-28 NOTE — ED Notes (Signed)
Per Dr. Olevia Bowens, pt can begin his blood transfusion upstairs.

## 2017-11-29 ENCOUNTER — Other Ambulatory Visit: Payer: Self-pay

## 2017-11-29 ENCOUNTER — Encounter (HOSPITAL_COMMUNITY)
Admission: EM | Disposition: A | Discharge status: Home / Self Care | Payer: Self-pay | Source: Home / Self Care | Attending: Family Medicine

## 2017-11-29 ENCOUNTER — Inpatient Hospital Stay (HOSPITAL_COMMUNITY): Payer: Medicare Other

## 2017-11-29 DIAGNOSIS — N179 Acute kidney failure, unspecified: Secondary | ICD-10-CM

## 2017-11-29 DIAGNOSIS — I10 Essential (primary) hypertension: Secondary | ICD-10-CM

## 2017-11-29 DIAGNOSIS — E119 Type 2 diabetes mellitus without complications: Secondary | ICD-10-CM

## 2017-11-29 DIAGNOSIS — I25708 Atherosclerosis of coronary artery bypass graft(s), unspecified, with other forms of angina pectoris: Secondary | ICD-10-CM

## 2017-11-29 DIAGNOSIS — K921 Melena: Principal | ICD-10-CM

## 2017-11-29 DIAGNOSIS — D649 Anemia, unspecified: Secondary | ICD-10-CM

## 2017-11-29 DIAGNOSIS — N183 Chronic kidney disease, stage 3 (moderate): Secondary | ICD-10-CM

## 2017-11-29 HISTORY — PX: GIVENS CAPSULE STUDY: SHX5432

## 2017-11-29 LAB — CREATININE, URINE, RANDOM: CREATININE, URINE: 83.16 mg/dL

## 2017-11-29 LAB — CBC
HCT: 21.7 % — ABNORMAL LOW (ref 39.0–52.0)
Hemoglobin: 6.6 g/dL — CL (ref 13.0–17.0)
MCH: 29.3 pg (ref 26.0–34.0)
MCHC: 30.4 g/dL (ref 30.0–36.0)
MCV: 96.4 fL (ref 78.0–100.0)
PLATELETS: 121 10*3/uL — AB (ref 150–400)
RBC: 2.25 MIL/uL — AB (ref 4.22–5.81)
RDW: 14.5 % (ref 11.5–15.5)
WBC: 3.8 10*3/uL — ABNORMAL LOW (ref 4.0–10.5)

## 2017-11-29 LAB — BASIC METABOLIC PANEL
ANION GAP: 4 — AB (ref 5–15)
Anion gap: 7 (ref 5–15)
BUN: 39 mg/dL — ABNORMAL HIGH (ref 6–20)
BUN: 31 mg/dL — AB (ref 6–20)
CALCIUM: 8.2 mg/dL — AB (ref 8.9–10.3)
CO2: 26 mmol/L (ref 22–32)
CO2: 24 mmol/L (ref 22–32)
CREATININE: 2.09 mg/dL — AB (ref 0.61–1.24)
Calcium: 8.4 mg/dL — ABNORMAL LOW (ref 8.9–10.3)
Chloride: 110 mmol/L (ref 101–111)
Chloride: 109 mmol/L (ref 101–111)
Creatinine, Ser: 2.47 mg/dL — ABNORMAL HIGH (ref 0.61–1.24)
GFR calc Af Amer: 33 mL/min — ABNORMAL LOW (ref >60)
GFR, EST AFRICAN AMERICAN: 27 mL/min — AB (ref >60)
GFR, EST NON AFRICAN AMERICAN: 23 mL/min — AB (ref >60)
GFR, EST NON AFRICAN AMERICAN: 28 mL/min — AB (ref >60)
GLUCOSE: 90 mg/dL (ref 65–99)
GLUCOSE: 140 mg/dL — AB (ref 65–99)
POTASSIUM: 3.8 mmol/L (ref 3.5–5.1)
Potassium: 4 mmol/L (ref 3.5–5.1)
SODIUM: 140 mmol/L (ref 135–145)
SODIUM: 140 mmol/L (ref 135–145)

## 2017-11-29 LAB — URINALYSIS, COMPLETE (UACMP) WITH MICROSCOPIC
BILIRUBIN URINE: NEGATIVE
GLUCOSE, UA: NEGATIVE mg/dL
Ketones, ur: NEGATIVE mg/dL
NITRITE: NEGATIVE
PH: 6 (ref 5.0–8.0)
Protein, ur: 30 mg/dL — AB
Specific Gravity, Urine: 1.011 (ref 1.005–1.030)

## 2017-11-29 LAB — GLUCOSE, CAPILLARY
GLUCOSE-CAPILLARY: 86 mg/dL (ref 65–99)
GLUCOSE-CAPILLARY: 127 mg/dL — AB (ref 65–99)
GLUCOSE-CAPILLARY: 113 mg/dL — AB (ref 65–99)
Glucose-Capillary: 91 mg/dL (ref 65–99)
Glucose-Capillary: 180 mg/dL — ABNORMAL HIGH (ref 65–99)

## 2017-11-29 LAB — MAGNESIUM: Magnesium: 1.8 mg/dL (ref 1.7–2.4)

## 2017-11-29 LAB — PREPARE RBC (CROSSMATCH)

## 2017-11-29 LAB — HEMOGLOBIN AND HEMATOCRIT, BLOOD
HCT: 26.1 % — ABNORMAL LOW (ref 39.0–52.0)
HEMATOCRIT: 25.5 % — AB (ref 39.0–52.0)
Hemoglobin: 8 g/dL — ABNORMAL LOW (ref 13.0–17.0)
Hemoglobin: 7.9 g/dL — ABNORMAL LOW (ref 13.0–17.0)

## 2017-11-29 LAB — ERYTHROPOIETIN: Erythropoietin: 209.6 m[IU]/mL — ABNORMAL HIGH (ref 2.6–18.5)

## 2017-11-29 LAB — TROPONIN I

## 2017-11-29 LAB — SODIUM, URINE, RANDOM: Sodium, Ur: 82 mmol/L

## 2017-11-29 SURGERY — IMAGING PROCEDURE, GI TRACT, INTRALUMINAL, VIA CAPSULE

## 2017-11-29 MED ORDER — SODIUM CHLORIDE 0.9 % IV SOLN
INTRAVENOUS | Status: DC
  Administered 2017-11-29: 12:00:00 via INTRAVENOUS

## 2017-11-29 NOTE — Consult Note (Signed)
Referring Provider: Ilene Qua, MD Primary Care Physician:  [name], MD Primary Gastroenterologist:  Dr. Laural Golden  Reason for Consultation:    Melena and anemia.  HPI:   Patient is 83 year old Caucasian male with multiple medical problems including artery artery disease, GERD B12 deficiency diabetes mellitus as well as history of iron deficiency anemia with need for intermittent parenteral iron infusion as well as blood transfusion who had routine blood work by and noted to have hemoglobin of 7 g.  Patient was therefore advised to go to emergency room.  He presented to the emergency room last evening.  Patient was noted to have hemoglobin of 7 g with hematocrit of 23.4.  Patient has received 2 units of PRBCs so far.  He was noted to have heme positive stool.  He has had multiple heme positive stools in the past. Patient reports intermittent melena.  His wife states he has had melena for months.  He denies rectal bleeding abdominal pain nausea or vomiting.  He has good appetite.  He states he has lost 15 pounds in the last 1 year.  He is not sure why he has lost weight. Lately he has noted postural lightheadedness.  He also felt weak.  He did not experience exertional dyspnea.  He is supposed to be on low-dose aspirin but he does not take it every day.  He does not take other OTC NSAIDs.  About 3 months ago evaluated for gross hematuria and ureteral stent had to be changed.  He states he has not had any more hematuria. His wife states he has had multiple units of PRBCs since last year.  His GI evaluation in the past has been  as follows  He had colonoscopy by Dr. Doristine Mango of either [facility] in April 2016 for heme positive stool.  He had extensive colonic diverticulosis along with removal of a small tubular adenoma from the ascending colon.  He was also noted to have radiation proctitis without stigmata of bleed.  He was evaluated by Dr. Oneida Alar in August last year.   He underwent EGD but no lesion was found to account for his GI blood loss.  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  He was noted to have pyloric stenosis which was balloon dilated and subsequently given capsule was placed in the duodenum.  No bleeding lesion was identified on this study. Patient underwent colonoscopy on 02/25/2017.  He had extensive diverticuli involving the sigmoid colon.  He also had external hemorrhoids and distal rectal telangiectasia.  These were ablated with APC.   Past Medical History:  Diagnosis Date  . AAA (abdominal aortic aneurysm) ([place])   . Anemia   . Arthritis   . B12 deficiency   . Blood transfusion without reported diagnosis   . CAD (coronary artery disease)   . Cancer [facility])    radiation for prostate cancer and lupron.  . Cataract   . Complication of anesthesia    sts,"after hearat surgery my stomach didnt wake up like it was supossed to".  . Depression   . Diverticulitis   . DMII (diabetes mellitus, type 2) ([place]) 2008  . GERD (gastroesophageal reflux disease)   . GI bleed   . GIB (gastrointestinal bleeding) 09/18/2016  . Hiatal hernia   . History of kidney stones   . HTN (hypertension)   . Hyperlipidemia   . Iron deficiency anemia due to chronic blood loss 01/08/2017  . Kidney stone   . PONV (postoperative nausea  and vomiting)   . Prostate cancer ([place])   . Sleep apnea    cannot tolerate, PCP aware  . Ureteral stricture, left     Past Surgical History:  Procedure Laterality Date  . APPENDECTOMY    . BACK SURGERY     x2  . CATARACT EXTRACTION Left   . CHOLECYSTECTOMY    . COLONOSCOPY  03/2015   Dr. Britta Mccreedy: Diverticulosis, single tubular adenoma removed.  . COLONOSCOPY WITH PROPOFOL N/A 02/25/2017   Dr. Oneida Alar: non-thrombosed external hemorrhoids, significant looping of left colon. severe diverticulosis in recto-sigmoid colon and sigmoid colon. radiation proctitis contributing to transfusion dependent anemia, exacerbated by chronic  renal insufficiency and thrombocytopenia/aspirin use  . CORONARY ARTERY BYPASS GRAFT     1995  . CYSTOSCOPY     with stent-left kidney. x2  . CYSTOSCOPY W/ URETERAL STENT PLACEMENT Left 05/14/2017   Procedure: CYSTOSCOPY WITH RETROGRADE PYELOGRAM/URETERAL STENT EXCHANGE;  Surgeon: Cleon Gustin, MD;  Location: [facility];  Service: Urology;  Laterality: Left;  . CYSTOSCOPY W/ URETERAL STENT PLACEMENT Left 10/13/2017   Procedure: CYSTOSCOPY WITH LEFT RETROGRADE PYELOGRAM/LEFT URETERAL STENT REPLACEMENT;  Surgeon: Cleon Gustin, MD;  Location: [facility];  Service: Urology;  Laterality: Left;  . CYSTOSCOPY WITH FULGERATION  08/13/2017   Procedure: CYSTOSCOPY WITH FULGERATION OF BLADDER;  Surgeon: Cleon Gustin, MD;  Location: [facility];  Service: Urology;;  . Consuela Mimes WITH RETROGRADE PYELOGRAM, URETEROSCOPY AND STENT PLACEMENT Left 08/13/2017   Procedure: CYSTOSCOPY WITH LEFT RETROGRADE PYELOGRAM, LEFT URETEROSCOPY AND STENT REPLACEMENT;  Surgeon: Cleon Gustin, MD;  Location: [facility];  Service: Urology;  Laterality: Left;  . DESCENDING AORTIC ANEURYSM REPAIR W/ STENT     x2  . ESOPHAGOGASTRODUODENOSCOPY N/A 07/30/2016   Dr. Oneida Alar. Nonobstructing Schatzki ring at GE junction, multiple small sessile polyps with no bleeding or stigmata of recent bleeding in the gastric fundus and gastric body. Benign-appearing intrinsic moderate stenosis found the pylorus status post dilation. Small bowel capsule deployed.  Marland Kitchen GIVENS CAPSULE STUDY     Capsule study is complete to the cecum. No obvious lesions, mass, tumors. Small wisps of blood noted in small bowel secondary to EGD/dilation. Possible few erosions in setting of NSAIDs. No obvious areas of bleeding.  Marland Kitchen HERNIA REPAIR    . HOLMIUM LASER APPLICATION Left 3/47/4259   Procedure: HOLMIUM LASER LITHOTRIPSY OF ENCRUSTED LEFT URETERAL STENT;  Surgeon: Cleon Gustin, MD;  Location: [facility];  Service: Urology;  Laterality: Left;  . SPINE SURGERY      ruptured discs    Prior to Admission medications   Medication Sig Start Date End Date Taking? Authorizing Provider  aspirin EC 81 MG tablet Take 1 tablet (81 mg total) by mouth daily. 10/02/17  Yes Herminio Commons, MD  B Complex Vitamins (B COMPLEX-B12 PO) Take 1 drop by mouth every morning.   Yes [provider]  cholecalciferol (VITAMIN D) 1000 units tablet Take 1,000 Units by mouth 2 (two) times daily.   Yes [provider]  finasteride (PROSCAR) 5 MG tablet Take 5 mg by mouth daily.   Yes [provider]  glimepiride (AMARYL) 2 MG tablet Take 2 mg by mouth 2 (two) times daily.    Yes [provider]  nitroGLYCERIN (NITROSTAT) 0.4 MG SL tablet Place 1 tablet (0.4 mg total) under the tongue every 5 (five) minutes as needed for chest pain. 04/05/16  Yes Herminio Commons, MD  omeprazole (PRILOSEC) 20 MG capsule Take 1 capsule (20 mg  total) by mouth 2 (two) times daily before a meal. Patient taking differently: Take 20 mg by mouth daily.  07/31/16  Yes Samuella Cota, MD  sertraline (ZOLOFT) 50 MG tablet Take 50 mg by mouth daily.   Yes [provider]  simvastatin (ZOCOR) 80 MG tablet Take 80 mg by mouth at bedtime.   Yes [provider]  tamsulosin (FLOMAX) 0.4 MG CAPS Take 0.4 mg by mouth daily after supper.   Yes [provider]    Current Facility-Administered Medications  Medication Dose Route Frequency Provider Last Rate Last Dose  . acetaminophen (TYLENOL) tablet 650 mg  650 mg Oral Q6H PRN Opyd, Ilene Qua, MD       Or  . acetaminophen (TYLENOL) suppository 650 mg  650 mg Rectal Q6H PRN Opyd, Ilene Qua, MD      . atorvastatin (LIPITOR) tablet 40 mg  40 mg Oral q1800 Opyd, Ilene Qua, MD   40 mg at 11/28/17 2316  . cholecalciferol (VITAMIN D) tablet 1,000 Units  1,000 Units Oral BID Vianne Bulls, MD   1,000 Units at 11/29/17 0859  . finasteride (PROSCAR) tablet 5 mg  5 mg Oral Daily Opyd, Ilene Qua, MD   5 mg  at 11/29/17 0919  . HYDROcodone-acetaminophen (NORCO/VICODIN) 5-325 MG per tablet 1-2 tablet  1-2 tablet Oral Q6H PRN Opyd, Ilene Qua, MD      . insulin aspart (novoLOG) injection 0-9 Units  0-9 Units Subcutaneous Q4H Opyd, Ilene Qua, MD      . ondansetron (ZOFRAN) tablet 4 mg  4 mg Oral Q6H PRN Opyd, Ilene Qua, MD       Or  . ondansetron (ZOFRAN) injection 4 mg  4 mg Intravenous Q6H PRN Opyd, Ilene Qua, MD      . pantoprazole (PROTONIX) injection 40 mg  40 mg Intravenous Q12H Opyd, Ilene Qua, MD   40 mg at 11/29/17 0859  . sertraline (ZOLOFT) tablet 50 mg  50 mg Oral Daily Opyd, Ilene Qua, MD   50 mg at 11/29/17 0859  . sodium chloride flush (NS) 0.9 % injection 3 mL  3 mL Intravenous Q12H Opyd, Ilene Qua, MD   3 mL at 11/29/17 0859  . tamsulosin (FLOMAX) capsule 0.4 mg  0.4 mg Oral QPC supper Opyd, Ilene Qua, MD   0.4 mg at 11/28/17 2316  . vitamin B-12 (CYANOCOBALAMIN) tablet 1,000 mcg  1,000 mcg Oral Daily Opyd, Ilene Qua, MD   1,000 mcg at 11/29/17 0858    Allergies as of 11/28/2017 - Review Complete 11/28/2017  Allergen Reaction Noted  . Penicillins Rash and Other (See Comments) 08/27/2013    Family History  Problem Relation Age of Onset  . Heart disease Unknown   . Diabetes Unknown   . Hypertension Unknown   . Liver cancer Mother   . Arthritis Mother   . Cancer Mother        died at 71  . Heart disease Mother   . Alzheimer's disease Father   . Cancer Brother        lung  . Cancer Son 91       lung  . Hepatitis Brother   . Alcohol abuse Brother   . Heart disease Son 66       heart attack, stents  . Diabetes Son   . Colon cancer Neg Hx     Social History   Socioeconomic History  . Marital status: Married    Spouse name: [name]  . Number  of children: 5  . Years of education: 33- GED  . Highest education level: Not on file  Social Needs  . Financial resource strain: Not on file  . Food insecurity - worry: Not on file  . Food insecurity - inability: Not on file  .  Transportation needs - medical: Not on file  . Transportation needs - non-medical: Not on file  Occupational History  . Occupation: retired    Comment: Contractor  Tobacco Use  . Smoking status: Former Smoker    Packs/day: 3.00    Years: 45.00    Pack years: 135.00    Types: Cigarettes    Start date: 04/19/1951    Last attempt to quit: 03/01/1994    Years since quitting: 23.7  . Smokeless tobacco: Never Used  Substance and Sexual Activity  . Alcohol use: Yes    Alcohol/week: 0.0 oz    Comment: 1-2 beers daily   . Drug use: No  . Sexual activity: Yes    Birth control/protection: None    Comment: married 59 years - 5 children   Other Topics Concern  . Not on file  Social History Narrative   Married to [place] for 60+ years   4 years in the [place]   Retired, mows grass    Review of Systems: See HPI, otherwise normal ROS  Physical Exam: Temp:  [97.9 F (36.6 C)-98.2 F (36.8 C)] 98 F (36.7 C) (12/01 0930) Pulse Rate:  [62-84] 64 (12/01 0930) Resp:  [15-21] 18 (12/01 0930) BP: (128-154)/(45-87) 137/52 (12/01 0930) SpO2:  [93 %-100 %] 99 % (12/01 0930) Weight:  [192 lb 12.8 oz (87.5 kg)-197 lb 6.4 oz (89.5 kg)] 192 lb 12.8 oz (87.5 kg) (12/01 0404) Last BM Date: 11/27/17  Patient is alert and in no acute distress. Patient appears pale and so is his conjunctiva. Sclera is nonicteric. Oropharyngeal mucosa is normal.  He has partial or incomplete upper dental plate. No neck masses or thyromegaly noted. Cardiac exam with regular rhythm normal S1 and S2. Abdomen is full.  Bowel sounds are normal.  No bruit noted.  On palpation abdomen is soft and nontender without organomegaly or masses. No peripheral edema or clubbing noted.    Lab Results: Recent Labs    11/28/17 1356 11/28/17 1723 11/29/17 0400  WBC 5.6 5.0 3.8*  HGB 7.0* 7.0* 6.6*  HCT 23.4* 23.4* 21.7*  PLT 156 147* 121*   BMET Recent Labs    11/28/17 1356 11/28/17 1723 11/29/17 0400  NA 139 139 140  K  4.2 3.9 3.8  CL 106 106 110  CO2 25 25 26   GLUCOSE 124* 157* 90  BUN 36* 38* 39*  CREATININE 3.09* 3.02* 2.47*  CALCIUM 8.7* 8.7* 8.2*   LFT Recent Labs    11/28/17 1723  PROT 6.9  ALBUMIN 3.3*  AST 27  ALT 22  ALKPHOS 62  BILITOT 0.3     Assessment;  Patient is 83 year old Caucasian male who presents with symptomatic anemia.  He has a history of GI bleed for which he has undergone multiple studies in the past as reviewed above.  He has a history of radiation proctitis which is ablated with APC in February this year but blood loss does not appear to be related to this finding.  He possibly has GI angiodysplasia or Deulafoy lesion.  He possibly also have an element of chronic disease given that he is diabetic and also has knee disease.  He has left ureteral stent in  place.  Urine analysis reveals multiple red cells.  Therefore chronic hematuria may also be contributing to his anemia. It would be reasonable to make another attempt to find the source of occult GI bleed.  This would be done with the small bowel given capsule study.  Could also consider GI bleeding scan if he has gross bleeding. Patient is agreeable to proceed with small bowel given capsule study.  She has received 2 units of PRBCs posttransfusion H&H is pending.  Recommendations;  Proceed with small bowel given capsule study today. Recommendations to follow.   LOS: 1 day   Najeeb Rehman  11/29/2017, 11:24 AM

## 2017-11-29 NOTE — Progress Notes (Signed)
CRITICAL VALUE ALERT  Critical Value:  Hemoglobin 6.6   Date & Time Notied:  11/29/2017, 8719  Provider Notified: T. Opyd  Orders Received/Actions taken: No new orders received at this moment.

## 2017-11-29 NOTE — Progress Notes (Signed)
PROGRESS NOTE    Andrew Zhang  DPO:[id number] DOB: 10/18/1934 DOA: 11/28/2017 PCP: [name], MD    Brief Narrative:  Andrew Zhang is a 83 y.o. male with medical history significant for prostate cancer status post radiation, coronary artery disease, chronic kidney disease stage III, chronic anemia, and type 2 diabetes mellitus, now presenting to the emergency department for evaluation of lightheadedness and melena.  Patient reports that he had been in his usual state of health until the insidious development fatigue approximately 1 week ago.  He is also noted some lightheadedness upon standing over the same interval.  His chronic dyspnea is slightly worse than usual as well.  Patient noted chest pain in triage, but explains that this is only when he rolls onto his side and is associated with back pain as well, but denies any exertional component.  Patient takes a baby aspirin daily.  Denies any vomiting or abdominal pain, but reports some dark stools for the past month, but notes that it seemed to be lighter this morning.  Denies any hematuria.     Assessment & Plan:   Principal Problem:   Symptomatic anemia Active Problems:   CAD (coronary artery disease) of artery bypass graft   HTN (hypertension)   Adenocarcinoma of prostate (HCC)   Type 2 diabetes mellitus without complications (HCC)   Acute on chronic renal failure (HCC)   Melena   Symptomatic anemia; melena  - Hemoglobin trended down since time of admission and therefore patient ended up being transfused an additional unit this morning - EGD in Feb '18 without explanation for melena at that time; colonoscopy from Aug '17 with severe left-sided diverticulosis, radiation proctitis, and external hemorrhoids  - FOBT is positive, no abd px, nausea, or vomiting - No tachycardia or hypotension  - Status post 1 unit pRBC ordered from ED  - Hold ASA, start IV PPI -GI consultation: Patient to undergo a capsule study  today - H&H at 5 PM and again at 5 AM   Acute kidney injury superimposed on CKD stage III - SCr is 3.02 on admission, up from 1.82 one month earlier   - Likely prerenal azotemia in setting of clinical hypovolemia and blood-loss  - Plan to hydrate with NS and pRBCs - avoid nephrotoxins where possible - renally-dose medications -Renal ultrasound showing no signs of acute kidney dysfunction -Creatinine improvedTo 2.47 this morning -Repeat BMP in the morning  CAD - EKG not significantly changed, chronic LBBB noted  -Troponin of less than 0.03 -Continue telemetry monitoring -Patient has no complaints of chest pain at this time    Type II DM  - A1c was 6.1% in July 2017 - Managed with glimepiride only at home, held on admission  - Check CBG's and use a low-intensity SSI with Novolog     DVT prophylaxis: SCDs Code Status: Full Code Family Communication: none bedside Disposition Plan: pending further evaluation of bleeding   Consultants:   GI  Procedures:   Capsule study on 12/1  Antimicrobials:   none    Subjective: Patient is sitting in bed watching television.  He is asking if he can eat dinner today at 8:00.  He denies any abdominal pain and is very concerned about the small electronic at his side having a bluelight versus an orange light.  Objective: Vitals:   11/29/17 0404 11/29/17 0907 11/29/17 0930 11/29/17 1145  BP: (!) 134/52 (!) 137/57 (!) 137/52 133/62  Pulse: 65 65 64 60  Resp: 15  18 18 18   Temp: 98.2 F (36.8 C) 98.1 F (36.7 C) 98 F (36.7 C) 98.3 F (36.8 C)  TempSrc: Oral Oral Oral Oral  SpO2: 97% 100% 99% 98%  Weight: 87.5 kg (192 lb 12.8 oz)     Height:        Intake/Output Summary (Last 24 hours) at 11/29/2017 1539 Last data filed at 11/29/2017 1500 Gross per 24 hour  Intake 2312.75 ml  Output -  Net 2312.75 ml   Filed Weights   11/28/17 1657 11/29/17 0404  Weight: 89.5 kg (197 lb 6.4 oz) 87.5 kg (192 lb 12.8 oz)     Examination:  General exam: Appears calm and comfortable  Respiratory system: Clear to auscultation. Respiratory effort normal. Cardiovascular system: S1 & S2 heard, RRR. No JVD, murmurs, rubs, gallops or clicks. No pedal edema. Gastrointestinal system: Abdomen is nondistended, soft and nontender. No organomegaly or masses felt. Normal bowel sounds heard. Central nervous system: Alert and oriented. No focal neurological deficits. Extremities: Symmetric 5 x 5 power. Skin: No rashes, lesions or ulcers Psychiatry: Judgement and insight appear normal. Mood & affect appropriate.     Data Reviewed: I have personally reviewed following labs and imaging studies  CBC: Recent Labs  Lab 11/28/17 1356 11/28/17 1723 11/29/17 0400 11/29/17 1230  WBC 5.6 5.0 3.8*  --   NEUTROABS 4.0 3.4  --   --   HGB 7.0* 7.0* 6.6* 8.0*  HCT 23.4* 23.4* 21.7* 26.1*  MCV 97.1 97.1 96.4  --   PLT 156 147* 121*  --    Basic Metabolic Panel: Recent Labs  Lab 11/28/17 1356 11/28/17 1723 11/29/17 0400  NA 139 139 140  K 4.2 3.9 3.8  CL 106 106 110  CO2 25 25 26   GLUCOSE 124* 157* 90  BUN 36* 38* 39*  CREATININE 3.09* 3.02* 2.47*  CALCIUM 8.7* 8.7* 8.2*   GFR: Estimated Creatinine Clearance: 27.3 mL/min (A) (by C-G formula based on SCr of 2.47 mg/dL (H)). Liver Function Tests: Recent Labs  Lab 11/28/17 1356 11/28/17 1723  AST 26 27  ALT 23 22  ALKPHOS 59 62  BILITOT 0.7 0.3  PROT 7.0 6.9  ALBUMIN 3.2* 3.3*   No results for input(s): LIPASE, AMYLASE in the last 168 hours. No results for input(s): AMMONIA in the last 168 hours. Coagulation Profile: No results for input(s): INR, PROTIME in the last 168 hours. Cardiac Enzymes: Recent Labs  Lab 11/28/17 1723  TROPONINI <0.03   BNP (last 3 results) No results for input(s): PROBNP in the last 8760 hours. HbA1C: No results for input(s): HGBA1C in the last 72 hours. CBG: Recent Labs  Lab 11/28/17 2334 11/29/17 0401 11/29/17 0837  11/29/17 1127  GLUCAP 102* 91 86 180*   Lipid Profile: No results for input(s): CHOL, HDL, LDLCALC, TRIG, CHOLHDL, LDLDIRECT in the last 72 hours. Thyroid Function Tests: No results for input(s): TSH, T4TOTAL, FREET4, T3FREE, THYROIDAB in the last 72 hours. Anemia Panel: Recent Labs    11/28/17 1357  FERRITIN 17*  TIBC 351  IRON 21*   Sepsis Labs: No results for input(s): PROCALCITON, LATICACIDVEN in the last 168 hours.  No results found for this or any previous visit (from the past 240 hour(s)).       Radiology Studies: US Renal  Result Date: 11/29/2017 CLINICAL DATA:  Renal failure EXAM: RENAL / URINARY TRACT ULTRASOUND COMPLETE COMPARISON:  CT 07/11/2017 FINDINGS: Right Kidney: Length: 10.4 cm. Echogenicity within normal limits. No mass or hydronephrosis visualized.  Left Kidney: Length: 13.8 cm. Echogenicity within normal limits. No mass or hydronephrosis visualized. Bladder: Appears normal for degree of bladder distention. Stent noted within the bladder. IMPRESSION: No acute findings.  No hydronephrosis. Electronically Signed   By: Rolm Baptise M.D.   On: 11/29/2017 11:15        Scheduled Meds: . atorvastatin  40 mg Oral q1800  . cholecalciferol  1,000 Units Oral BID  . finasteride  5 mg Oral Daily  . insulin aspart  0-9 Units Subcutaneous Q4H  . pantoprazole (PROTONIX) IV  40 mg Intravenous Q12H  . sertraline  50 mg Oral Daily  . sodium chloride flush  3 mL Intravenous Q12H  . tamsulosin  0.4 mg Oral QPC supper  . vitamin B-12  1,000 mcg Oral Daily   Continuous Infusions: . sodium chloride 20 mL/hr at 11/29/17 1213     LOS: 1 day    Time spent: 35 minutes    Loretha Stapler, MD [facility] Pager [phone]  If 7PM-7AM, please contact night-coverage [email or web address] Password [facility] 11/29/2017, 3:39 PM

## 2017-11-30 LAB — TROPONIN I

## 2017-11-30 LAB — HEMOGLOBIN AND HEMATOCRIT, BLOOD
HCT: 25.1 % — ABNORMAL LOW (ref 39.0–52.0)
HCT: 30.4 % — ABNORMAL LOW (ref 39.0–52.0)
Hemoglobin: 7.6 g/dL — ABNORMAL LOW (ref 13.0–17.0)
Hemoglobin: 9.5 g/dL — ABNORMAL LOW (ref 13.0–17.0)

## 2017-11-30 LAB — PREPARE RBC (CROSSMATCH)

## 2017-11-30 LAB — GLUCOSE, CAPILLARY
GLUCOSE-CAPILLARY: 136 mg/dL — AB (ref 65–99)
GLUCOSE-CAPILLARY: 156 mg/dL — AB (ref 65–99)
Glucose-Capillary: 101 mg/dL — ABNORMAL HIGH (ref 65–99)
Glucose-Capillary: 101 mg/dL — ABNORMAL HIGH (ref 65–99)
Glucose-Capillary: 155 mg/dL — ABNORMAL HIGH (ref 65–99)

## 2017-11-30 LAB — UREA NITROGEN, URINE: Urea Nitrogen, Ur: 479 mg/dL

## 2017-11-30 MED ORDER — GLIMEPIRIDE 2 MG PO TABS: 2.0000 mg | ORAL_TABLET | Freq: Every day | ORAL | Status: DC

## 2017-11-30 MED ORDER — SODIUM CHLORIDE 0.9 % IV SOLN
Freq: Once | INTRAVENOUS | Status: AC
  Administered 2017-11-30: 12:00:00 via INTRAVENOUS

## 2017-11-30 NOTE — Progress Notes (Signed)
Patient states understanding of discharge instructions.  

## 2017-11-30 NOTE — Progress Notes (Signed)
MD notified about drop in patient's Hgb and Hct. Continue to monitor

## 2017-11-30 NOTE — Progress Notes (Signed)
  Subjective:  Patient has no complaints.  He is not feeling dizzy or lightheaded when he walks to the bathroom.  He denies nausea vomiting or abdominal pain.  Is not had a BM today.  He wonders if he can home today.  Objective: Blood pressure (!) 132/58, pulse 65, temperature 98.4 F (36.9 C), temperature source Oral, resp. rate 16, height 6\' 2"  (1.88 m), weight 192 lb 8 oz (87.3 kg), SpO2 94 %. Patient is alert and in no acute distress. He remains pale. Abdomen is protuberant but soft and nontender without organomegaly or masses.  Labs/studies Results:  Recent Labs    December 24, 2017 1356 24-Dec-2017 1723 11/29/17 0400 11/29/17 1230 11/29/17 1648 11/30/17 0557  WBC 5.6 5.0 3.8*  --   --   --   HGB 7.0* 7.0* 6.6* 8.0* 7.9* 7.6*  HCT 23.4* 23.4* 21.7* 26.1* 25.5* 25.1*  PLT 156 147* 121*  --   --   --     BMET  Recent Labs    12-24-2017 1723 11/29/17 0400 11/29/17 1738  NA 139 140 140  K 3.9 3.8 4.0  CL 106 110 109  CO2 25 26 24   GLUCOSE 157* 90 140*  BUN 38* 39* 31*  CREATININE 3.02* 2.47* 2.09*  CALCIUM 8.7* 8.2* 8.4*    LFT  Recent Labs    12-24-2017 1356 12/24/2017 1723  PROT 7.0 6.9  ALBUMIN 3.2* 3.3*  AST 26 27  ALT 23 22  ALKPHOS 59 62  BILITOT 0.7 0.3     Assessment:  #1.  GI bleed of occult origin.  Small bowel capsule study reveals few petechiae and gastric antrum but without significant bleeding and 2 tiny small bowel AV malformations without GI bleed(separate report).  Therefore source of chronic GI bleed remains unknown. He may benefit from small bowel endoscopy.  Will discuss with Dr. Oneida Alar tomorrow.  #2.  Acute on chronic anemia.  Patient has received 2 units of PRBCs. Since his hemoglobin is expected to drop it would be a good idea to give another unit of PRBCs.  He may also benefit from erythropoietin.  Will discuss with Dr. Talbert Cage.  Recommendations:  Consider another unit of PRBCs. He may benefit from small bowel endoscopy.  Will discuss with  Dr.Fields.

## 2017-11-30 NOTE — Progress Notes (Signed)
Patient's condition and small bowel given capsule study findings discussed with his wife and other family members. He just had a bowel movement and passed formed brown stool. He is not having active GI bleed. Will obtain posttransfusion H&H prior to discharge. Will need for follow-up at our GI within 1-2 weeks.

## 2017-11-30 NOTE — Discharge Summary (Signed)
Physician Discharge Summary  GEVORK AYYAD TDD:[id number] DOB: Nov 12, 1934 DOA: 11/28/2017  PCP: [name], MD  Admit date: 11/28/2017 Discharge date: 11/30/2017  Admitted From: Home Disposition: Home  Recommendations for Outpatient Follow-up:  1. Follow up with PCP in 1-2 weeks 2. Follow-up with other providers listed on discharge summary- cardiologist, gastroenterologist, oncologist/hematologist 3. Please obtain BMP/CBC in one week   Home Health: None Equipment/Devices: None  Discharge Condition: Stable CODE STATUS: Full code Diet recommendation: Heart healthy carb modified diet  Brief/Interim Summary: Kai Levins Marloweis a 83 y.o.malewith medical history significant forprostate cancer status post radiation, coronary artery disease, chronic kidney disease stage III, chronic anemia, and type 2 diabetes mellitus, now presenting to the emergency department for evaluation of lightheadedness and melena. Patient reports that he had been in his usual state of health until the insidious development fatigue approximately 1 week ago. He is also noted some lightheadedness upon standing over the same interval. His chronic dyspnea is slightly worse than usual as well. Patient noted chest pain in triage, but explains that this is only when he rolls onto his side and is associated with back pain as well, but denies any exertional component. Patient takes a baby aspirin daily. Denies any vomiting or abdominal pain, but reports some dark stools for the past month, but notes that it seemed to be lighter this morning. Denies any hematuria.  Patient was admitted to the hospital and was evaluated by gastroenterology.  He underwent a capsule study which showed no signs of active bleeding and only to use tiny small bowel AV malformations.  At this time the source of a chronic GI bleed remains unknown.  Patient was given 2 units of PRBCs at time of admission and received 1 additional unit on  day of discharge which was 11/30/2017.  Per Dr.Rehman's note on 11/30/2017 patient may benefit from a small bowel endoscopy but this will be discussed with Dr. Oneida Alar tomorrow.  Patient reports he did not feel any weakness or increased tiredness on day of discharge.  He has a history of chronic normocytic anemia anemia and is followed by hematology outpatient.  He is instructed to follow-up with hematology outpatient for further evaluation.  He also voices he understands he needs to follow-up with his primary care physician as well as his cardiologist. Of note on telemetry it was noted to have a dropped beat which was labeled as second-degree heart block.  Discussed with on-call cardiologist on 11/30/2017 telemetry strip in chart shows just one dropped beat.  As patient was asymptomatic no further workup is necessary.  Discharge Diagnoses:  Principal Problem:   Symptomatic anemia Active Problems:   CAD (coronary artery disease) of artery bypass graft   HTN (hypertension)   Adenocarcinoma of prostate (HCC)   Type 2 diabetes mellitus without complications ([place])   Acute on chronic renal failure ([place])   Melena    Discharge Instructions  Discharge Instructions    Call MD for:  difficulty breathing, headache or visual disturbances   Complete by:  As directed    Call MD for:  extreme fatigue   Complete by:  As directed    Call MD for:  hives   Complete by:  As directed    Call MD for:  persistant dizziness or light-headedness   Complete by:  As directed    Call MD for:  persistant nausea and vomiting   Complete by:  As directed    Call MD for:  severe uncontrolled pain  Complete by:  As directed    Call MD for:  temperature >100.4   Complete by:  As directed    Diet - low sodium heart healthy   Complete by:  As directed    Discharge instructions   Complete by:  As directed    Follow-up with gastroenterology Take medication as prescribed See primary care within the next 7-10 days for  repeat blood work   Increase activity slowly   Complete by:  As directed      Allergies as of 11/30/2017      Reactions   Penicillins Rash, Other (See Comments)   Happened in [id number] Has patient had a PCN reaction causing immediate rash, facial/tongue/throat swelling, SOB or lightheaddness with hypotension: Yes  Has patient had a PCN reaction causing severe rash involving mucus membranes or skin necrosis: Unknown Has patient had a PCN reaction that required hospitalization: No Has patient had a PCN reaction occurring within the last 10 years: No If all of the above answers are "NO", then may proceed with Cephalosporin use.      Medication List    TAKE these medications   aspirin EC 81 MG tablet Take 1 tablet (81 mg total) by mouth daily.   B COMPLEX-B12 PO Take 1 drop by mouth every morning.   cholecalciferol 1000 units tablet Commonly known as:  VITAMIN D Take 1,000 Units by mouth 2 (two) times daily.   finasteride 5 MG tablet Commonly known as:  PROSCAR Take 5 mg by mouth daily.   glimepiride 2 MG tablet Commonly known as:  AMARYL Take 1 tablet (2 mg total) by mouth daily with breakfast. What changed:  when to take this   nitroGLYCERIN 0.4 MG SL tablet Commonly known as:  NITROSTAT Place 1 tablet (0.4 mg total) under the tongue every 5 (five) minutes as needed for chest pain.   omeprazole 20 MG capsule Commonly known as:  PRILOSEC Take 1 capsule (20 mg total) by mouth 2 (two) times daily before a meal. What changed:  when to take this   sertraline 50 MG tablet Commonly known as:  ZOLOFT Take 50 mg by mouth daily.   simvastatin 80 MG tablet Commonly known as:  ZOCOR Take 80 mg by mouth at bedtime.   tamsulosin 0.4 MG Caps capsule Commonly known as:  FLOMAX Take 0.4 mg by mouth daily after supper.      Follow-up Information    [facility], PA-C. Schedule an appointment as soon as possible for a visit in 1 month(s).   Specialty:  Oncology Contact  information: [place] [address] [phone]        Herminio Commons, MD. Schedule an appointment as soon as possible for a visit in 1 month(s).   Specialty:  Cardiology Contact information: [place] [address] [phone]        Rogene Houston, MD .   Specialty:  Gastroenterology Contact information: [place], [address] [place] [address] [phone]        [name], MD. Schedule an appointment as soon as possible for a visit in 1 week(s).   Specialty:  Family Medicine Contact information: [address] [place] [address] [phone]          Allergies  Allergen Reactions  . Penicillins Rash and Other (See Comments)    Happened in [id number] Has patient had a PCN reaction causing immediate rash, facial/tongue/throat swelling, SOB  or lightheaddness with hypotension: Yes  Has patient had a PCN reaction causing severe rash involving mucus membranes or skin necrosis: Unknown Has patient had a PCN reaction that required hospitalization: No Has patient had a PCN reaction occurring within the last 10 years: No If all of the above answers are "NO", then may proceed with Cephalosporin use.    Consultations:  Gastroenterology   Procedures/Studies: US Renal  Result Date: 11/29/2017 CLINICAL DATA:  Renal failure EXAM: RENAL / URINARY TRACT ULTRASOUND COMPLETE COMPARISON:  CT 07/11/2017 FINDINGS: Right Kidney: Length: 10.4 cm. Echogenicity within normal limits. No mass or hydronephrosis visualized. Left Kidney: Length: 13.8 cm. Echogenicity within normal limits. No mass or hydronephrosis visualized. Bladder: Appears normal for degree of bladder distention. Stent noted within the bladder. IMPRESSION: No acute findings.  No hydronephrosis. Electronically Signed   By: Rolm Baptise M.D.   On: 11/29/2017 11:15      Subjective: Patient seen and examined.  He is hopeful to go home today.  He voices that Dr.  Laural Golden said that he could go home today if he got another blood transfusion.  He denies any weakness or dizziness.  He is able to ambulate to and from the restroom without any concerns.  He understands he needs to follow-up with both his cardiologist, his hematologist, and his urologist as well as Dr. Laural Golden for his gastroenterology.  Discharge Exam: Vitals:   11/30/17 0400 11/30/17 1340  BP: (!) 132/58 (!) 141/53  Pulse: 65 66  Resp: 16 18  Temp: 98.4 F (36.9 C) 98.2 F (36.8 C)  SpO2: 94% 96%   Vitals:   11/29/17 2037 11/30/17 0400 11/30/17 0443 11/30/17 1340  BP: (!) 145/56 (!) 132/58  (!) 141/53  Pulse: 68 65  66  Resp: 18 16  18   Temp: 98.2 F (36.8 C) 98.4 F (36.9 C)  98.2 F (36.8 C)  TempSrc: Oral Oral  Oral  SpO2: 98% 94%  96%  Weight:   87.3 kg (192 lb 8 oz)   Height:        General: Pt is alert, awake, not in acute distress Cardiovascular: RRR, S1/S2 +, no rubs, no gallops Respiratory: CTA bilaterally, no wheezing, no rhonchi Abdominal: Soft, NT, ND, bowel sounds + Extremities: no edema, no cyanosis    The results of significant diagnostics from this hospitalization (including imaging, microbiology, ancillary and laboratory) are listed below for reference.     Microbiology: No results found for this or any previous visit (from the past 240 hour(s)).   Labs: BNP (last 3 results) No results for input(s): BNP in the last 8760 hours. Basic Metabolic Panel: Recent Labs  Lab 11/28/17 1356 11/28/17 1723 11/29/17 0400 11/29/17 1738  NA 139 139 140 140  K 4.2 3.9 3.8 4.0  CL 106 106 110 109  CO2 25 25 26 24   GLUCOSE 124* 157* 90 140*  BUN 36* 38* 39* 31*  CREATININE 3.09* 3.02* 2.47* 2.09*  CALCIUM 8.7* 8.7* 8.2* 8.4*  MG  --   --   --  1.8   Liver Function Tests: Recent Labs  Lab 11/28/17 1356 11/28/17 1723  AST 26 27  ALT 23 22  ALKPHOS 59 62  BILITOT 0.7 0.3  PROT 7.0 6.9  ALBUMIN 3.2* 3.3*   No results for input(s): LIPASE, AMYLASE in  the last 168 hours. No results for input(s): AMMONIA in the last 168 hours. CBC: Recent Labs  Lab 11/28/17 1356 11/28/17 1723 11/29/17 0400 11/29/17 1230 11/29/17 1648  11/30/17 0557  WBC 5.6 5.0 3.8*  --   --   --   NEUTROABS 4.0 3.4  --   --   --   --   HGB 7.0* 7.0* 6.6* 8.0* 7.9* 7.6*  HCT 23.4* 23.4* 21.7* 26.1* 25.5* 25.1*  MCV 97.1 97.1 96.4  --   --   --   PLT 156 147* 121*  --   --   --    Cardiac Enzymes: Recent Labs  Lab 11/28/17 1723 11/29/17 1738 11/29/17 2325 11/30/17 0557  TROPONINI <0.03 <0.03 <0.03 <0.03   BNP: Invalid input(s): POCBNP CBG: Recent Labs  Lab 11/29/17 2043 11/30/17 0008 11/30/17 0442 11/30/17 0746 11/30/17 1140  GLUCAP 113* 136* 101* 101* 155*   D-Dimer No results for input(s): DDIMER in the last 72 hours. Hgb A1c No results for input(s): HGBA1C in the last 72 hours. Lipid Profile No results for input(s): CHOL, HDL, LDLCALC, TRIG, CHOLHDL, LDLDIRECT in the last 72 hours. Thyroid function studies No results for input(s): TSH, T4TOTAL, T3FREE, THYROIDAB in the last 72 hours.  Invalid input(s): FREET3 Anemia work up Recent Labs    11/28/17 1357  FERRITIN 17*  TIBC 351  IRON 21*   Urinalysis    Component Value Date/Time   COLORURINE YELLOW 11/29/2017 0011   APPEARANCEUR CLOUDY (A) 11/29/2017 0011   LABSPEC 1.011 11/29/2017 0011   PHURINE 6.0 11/29/2017 0011   GLUCOSEU NEGATIVE 11/29/2017 0011   HGBUR MODERATE (A) 11/29/2017 0011   BILIRUBINUR NEGATIVE 11/29/2017 0011   KETONESUR NEGATIVE 11/29/2017 0011   PROTEINUR 30 (A) 11/29/2017 0011   NITRITE NEGATIVE 11/29/2017 0011   LEUKOCYTESUR LARGE (A) 11/29/2017 0011   Sepsis Labs Invalid input(s): PROCALCITONIN,  WBC,  LACTICIDVEN Microbiology No results found for this or any previous visit (from the past 240 hour(s)).   Time coordinating discharge: 35 minutes  SIGNED:   Loretha Stapler, MD  [facility] 11/30/2017, 2:04 PM Pager [phone] If 7PM-7AM,  please contact night-coverage [email or web address] Password [email or web address]

## 2017-11-30 NOTE — Op Note (Signed)
Small Bowel [place] Capsule Study Procedure date: 11/29/2017  Referring Provider:  Ilene Qua, MD PCP:  Dr. Meda Coffee, Jennette Banker, MD  Indication for procedure:  Patient is 83 year old Caucasian male with multiple medical problems who also has GI bleed of occult origin.  He has had multiple heme positive stools and he also has well-documented iron deficiency anemia.  He now presents with melena.  Prior workup includes colonoscopy in 2016, EGD and small bowel given capsule study in August 2017 and colonoscopy in February 2018. He has received 2 units of PRBCs and will receive another unit today.   Findings:   Patient was able to swallow given capsule without any difficulty. Study is completed as Stool made to the colon. Multiple petechiae noted involving antral mucosa(best seen on images at 00:08:08, 00:08:47 and 00:22:58) Small AV malformation noted in proximal small bowel without evidence of active bleeding best seen on image at 00;32:37. Punctate petechiae also noted scattered throughout the small bowel but no evidence of active bleeding.  First Gastric image: 32 sec First Duodenal image: 30 minutes and 47 seconds First Ileo-Cecal Valve image: 5 hrs 31 min and 42 sec First Cecal image: 5 hrs 32 min and 6 sec Gastric Passage time: 30 min Small Bowel Passage time: 5 hrs.  Summary & Recommendations:  No evidence of active bleeding involving the stomach or mall bowel.  Bowel. Multiple gastric petechiae without stigmata of bleed. Single small AV malformation in proximal small bowel without stigmata of bleed. Scattered petechiae also noted involving mucosa of small bowel but no stigmata of bleed. Will discuss with Dr. Oneida Alar if he should be referred for small bowel endoscopy.

## 2017-12-01 ENCOUNTER — Encounter (HOSPITAL_COMMUNITY): Payer: Self-pay | Admitting: Internal Medicine

## 2017-12-01 ENCOUNTER — Other Ambulatory Visit: Payer: Self-pay | Admitting: Urology

## 2017-12-01 ENCOUNTER — Other Ambulatory Visit (HOSPITAL_COMMUNITY): Payer: Self-pay | Admitting: Oncology

## 2017-12-01 LAB — TYPE AND SCREEN
ABO/RH(D): B POS
Antibody Screen: NEGATIVE
UNIT DIVISION: 0
UNIT DIVISION: 0
Unit division: 0

## 2017-12-01 LAB — BPAM RBC
BLOOD PRODUCT EXPIRATION DATE: 201812182359
BLOOD PRODUCT EXPIRATION DATE: 201812262359
Blood Product Expiration Date: 201812262359
ISSUE DATE / TIME: 201812010902
ISSUE DATE / TIME: 201812021337
ISSUE DATE / TIME: 201811302054
UNIT TYPE AND RH: 1700
UNIT TYPE AND RH: 5100
UNIT TYPE AND RH: 5100

## 2017-12-03 ENCOUNTER — Telehealth: Payer: Self-pay | Admitting: Gastroenterology

## 2017-12-03 NOTE — Telephone Encounter (Signed)
[place] PATIENT REGARDING BLOOD WORK, HE WAS SEEN IN [place] BY DR [facility]

## 2017-12-03 NOTE — Telephone Encounter (Signed)
Spoke with the pts wife, she said NUR was seeing the pt in the hospital but told her that he said the pt would go back to [facility] when he was discharged. She also said NUR was going to talk to [facility] about his admission before he left for vacation.   NUR talked to them about the pt doing erythropoietin injections but she is not sure if the pt was supposed to be getting this or not. The pt is getting an iron infusion on Friday and she wasn't sure if it was safe to have the infusion and the injection at the same time.   The pts wife would like to go ahead and have the infusion this week and just wait until his ov with AB on 12/31/17 to discuss the injections.   Routing to [facility] for [employer]

## 2017-12-03 NOTE — Telephone Encounter (Addendum)
PLEASE CALL PT. 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

## 2017-12-03 NOTE — Telephone Encounter (Signed)
Routing to [place] to call pt.

## 2017-12-03 NOTE — Telephone Encounter (Signed)
Forwarding to Dr.Fields.  

## 2017-12-03 NOTE — Telephone Encounter (Signed)
PLEASE CALL PT. HIS BLOOD COUNT IS 9.5. REPEAT IN ONE MO. HE SHOULD HAVE THE IRON INFUSION. HE SHOULD FOLLOW WITH HEMATOLOGY FOR PROCRIT INJECTIONS AS WELL.

## 2017-12-04 NOTE — Telephone Encounter (Signed)
I called and got [facility], left message that he should do the iron infusion Friday, and to please call and I will go over all of the info with them.

## 2017-12-05 ENCOUNTER — Encounter (HOSPITAL_COMMUNITY): Payer: Self-pay | Admitting: Oncology

## 2017-12-05 ENCOUNTER — Encounter (HOSPITAL_COMMUNITY): Payer: Medicare Other | Attending: Oncology

## 2017-12-05 ENCOUNTER — Other Ambulatory Visit: Payer: Self-pay

## 2017-12-05 ENCOUNTER — Encounter (HOSPITAL_COMMUNITY): Payer: Self-pay

## 2017-12-05 ENCOUNTER — Ambulatory Visit (HOSPITAL_COMMUNITY): Payer: Medicare Other

## 2017-12-05 ENCOUNTER — Encounter (HOSPITAL_BASED_OUTPATIENT_CLINIC_OR_DEPARTMENT_OTHER): Payer: Medicare Other | Admitting: Oncology

## 2017-12-05 VITALS — BP 146/59 | HR 64 | Temp 98.0°F | Resp 20 | Wt 194.6 lb

## 2017-12-05 DIAGNOSIS — C61 Malignant neoplasm of prostate: Secondary | ICD-10-CM | POA: Insufficient documentation

## 2017-12-05 DIAGNOSIS — D696 Thrombocytopenia, unspecified: Secondary | ICD-10-CM | POA: Diagnosis not present

## 2017-12-05 DIAGNOSIS — D500 Iron deficiency anemia secondary to blood loss (chronic): Secondary | ICD-10-CM | POA: Insufficient documentation

## 2017-12-05 DIAGNOSIS — N183 Chronic kidney disease, stage 3 (moderate): Secondary | ICD-10-CM

## 2017-12-05 DIAGNOSIS — D509 Iron deficiency anemia, unspecified: Secondary | ICD-10-CM | POA: Diagnosis not present

## 2017-12-05 MED ORDER — SODIUM CHLORIDE 0.9 % IV SOLN
Freq: Once | INTRAVENOUS | Status: AC
  Administered 2017-12-05: 11:00:00 via INTRAVENOUS

## 2017-12-05 MED ORDER — SODIUM CHLORIDE 0.9 % IV SOLN
510.0000 mg | Freq: Once | INTRAVENOUS | Status: AC
  Administered 2017-12-05: 510 mg via INTRAVENOUS
  Filled 2017-12-05: qty 17

## 2017-12-05 NOTE — Telephone Encounter (Signed)
Pt and his wife are aware. Lab order on file for one month.

## 2017-12-05 NOTE — Progress Notes (Signed)
HEMATOLOGY/ONCOLOGY PROGRESS NOTE  Date of Service: 12/05/2017  Patient Care Team: [name], MD as PCP - General (Family Medicine) Herminio Commons, MD as Attending Physician (Cardiology) Danie Binder, MD as Consulting Physician (Gastroenterology)  CHIEF COMPLAINTS/PURPOSE OF CONSULTATION:  Anemia Stage III chronic kidney disease Low B12 Hypogonadism Iron deficiency  HISTORY OF PRESENTING ILLNESS:   Andrew Zhang is a wonderful 83 y.o. male who has been referred to [place] by Dr Meda Coffee, Jennette Banker, MD for evaluation and management of Anemia.  Patient has a history of coronary artery disease status post CABG, hypertension, diabetes, dyslipidemia, abdominal aortic aneurysm status post repair, prostate cancer currently on Lupron shots every 3 months (follows with Dr Willow Ora).  Patient presents today for continued follow-up of his iron deficiency anemia as well for a Feraheme treatment.  Patient was hospitalized from 11/28/2017 through 11/30/17 for lightheadedness and melena.  He noted that he was having melena for about a month.  Patient had a hemoglobin 7 g/dL on admission and received 2 units of PRBC transfusion.  On the day of discharge on 11/29/2017 he was noted to have hemoglobin 6.6 g/dL and he received another 1 unit PRBC transfusion.  Repeat hemoglobin on 12-18 demonstrated hemoglobin of 9.5 g/dL.  He had iron studies performed on 11/28/2017 which demonstrated a ferritin of 17. Patient had a capsule endoscopy performed on 11/29/2017 and was found to have no evidence of active bleeding involving the stomach or small bowel. Multiple gastric petechiae without stigmata of bleed. Single small AV malformation in proximal small bowel without stigmata of bleed. Scattered petechiae also noted involving mucosa of small bowel but no stigmata of bleed.   Patient states he is no longer having melanotic stools.  He denies any lightheadedness, headache, chest pain, shortness of  breath, abdominal pain, nausea, vomiting, diarrhea.  He denies any other sources of bleeding including hematochezia, hemoptysis, hematuria.  MEDICAL HISTORY:  Past Medical History:  Diagnosis Date  . AAA (abdominal aortic aneurysm) ([place])   . Anemia   . Arthritis   . B12 deficiency   . Blood transfusion without reported diagnosis   . CAD (coronary artery disease)   . Cancer [facility])    radiation for prostate cancer and lupron.  . Cataract   . Complication of anesthesia    sts,"after hearat surgery my stomach didnt wake up like it was supossed to".  . Depression   . Diverticulitis   . DMII (diabetes mellitus, type 2) ([place]) 2008  . GERD (gastroesophageal reflux disease)   . GI bleed   . GIB (gastrointestinal bleeding) 09/18/2016  . Hiatal hernia   . History of kidney stones   . HTN (hypertension)   . Hyperlipidemia   . Iron deficiency anemia due to chronic blood loss 01/08/2017  . Kidney stone   . PONV (postoperative nausea and vomiting)   . Prostate cancer ([place])   . Sleep apnea    cannot tolerate, PCP aware  . Ureteral stricture, left     SURGICAL HISTORY: Past Surgical History:  Procedure Laterality Date  . APPENDECTOMY    . BACK SURGERY     x2  . CATARACT EXTRACTION Left   . CHOLECYSTECTOMY    . COLONOSCOPY  03/2015   Dr. Britta Mccreedy: Diverticulosis, single tubular adenoma removed.  . COLONOSCOPY WITH PROPOFOL N/A 02/25/2017   Dr. Oneida Alar: non-thrombosed external hemorrhoids, significant looping of left colon. severe diverticulosis in recto-sigmoid colon and sigmoid colon. radiation proctitis contributing to transfusion dependent anemia,  exacerbated by chronic renal insufficiency and thrombocytopenia/aspirin use  . CORONARY ARTERY BYPASS GRAFT     1995  . CYSTOSCOPY     with stent-left kidney. x2  . CYSTOSCOPY W/ URETERAL STENT PLACEMENT Left 05/14/2017   Procedure: CYSTOSCOPY WITH RETROGRADE PYELOGRAM/URETERAL STENT EXCHANGE;  Surgeon: Cleon Gustin, MD;  Location: [facility]  ORS;  Service: Urology;  Laterality: Left;  . CYSTOSCOPY W/ URETERAL STENT PLACEMENT Left 10/13/2017   Procedure: CYSTOSCOPY WITH LEFT RETROGRADE PYELOGRAM/LEFT URETERAL STENT REPLACEMENT;  Surgeon: Cleon Gustin, MD;  Location: [facility] ORS;  Service: Urology;  Laterality: Left;  . CYSTOSCOPY WITH FULGERATION  08/13/2017   Procedure: CYSTOSCOPY WITH FULGERATION OF BLADDER;  Surgeon: Cleon Gustin, MD;  Location: [facility] ORS;  Service: Urology;;  . Consuela Mimes WITH RETROGRADE PYELOGRAM, URETEROSCOPY AND STENT PLACEMENT Left 08/13/2017   Procedure: CYSTOSCOPY WITH LEFT RETROGRADE PYELOGRAM, LEFT URETEROSCOPY AND STENT REPLACEMENT;  Surgeon: Cleon Gustin, MD;  Location: [facility] ORS;  Service: Urology;  Laterality: Left;  . DESCENDING AORTIC ANEURYSM REPAIR W/ STENT     x2  . ESOPHAGOGASTRODUODENOSCOPY N/A 07/30/2016   Dr. Oneida Alar. Nonobstructing Schatzki ring at GE junction, multiple small sessile polyps with no bleeding or stigmata of recent bleeding in the gastric fundus and gastric body. Benign-appearing intrinsic moderate stenosis found the pylorus status post dilation. Small bowel capsule deployed.  Marland Kitchen GIVENS CAPSULE STUDY     Capsule study is complete to the cecum. No obvious lesions, mass, tumors. Small wisps of blood noted in small bowel secondary to EGD/dilation. Possible few erosions in setting of NSAIDs. No obvious areas of bleeding.  Marland Kitchen GIVENS CAPSULE STUDY N/A 11/29/2017   Procedure: GIVENS CAPSULE STUDY;  Surgeon: Rogene Houston, MD;  Location: [facility] ENDO SUITE;  Service: Endoscopy;  Laterality: N/A;  . HERNIA REPAIR    . HOLMIUM LASER APPLICATION Left 7/51/0258   Procedure: HOLMIUM LASER LITHOTRIPSY OF ENCRUSTED LEFT URETERAL STENT;  Surgeon: Cleon Gustin, MD;  Location: [facility] ORS;  Service: Urology;  Laterality: Left;  . SPINE SURGERY     ruptured discs    SOCIAL HISTORY: Social History   Socioeconomic History  . Marital status: Married    Spouse name: [name]  . Number of  children: 5  . Years of education: 58- GED  . Highest education level: Not on file  Social Needs  . Financial resource strain: Not on file  . Food insecurity - worry: Not on file  . Food insecurity - inability: Not on file  . Transportation needs - medical: Not on file  . Transportation needs - non-medical: Not on file  Occupational History  . Occupation: retired    Comment: Contractor  Tobacco Use  . Smoking status: Former Smoker    Packs/day: 3.00    Years: 45.00    Pack years: 135.00    Types: Cigarettes    Start date: 04/19/1951    Last attempt to quit: 03/01/1994    Years since quitting: 23.7  . Smokeless tobacco: Never Used  Substance and Sexual Activity  . Alcohol use: Yes    Alcohol/week: 0.0 oz    Comment: 1-2 beers daily   . Drug use: No  . Sexual activity: Yes    Birth control/protection: None    Comment: married 59 years - 5 children   Other Topics Concern  . Not on file  Social History Narrative   Married to [place] for 60+ years   4 years in the [employer]   Retired, mows grass  FAMILY HISTORY: Family History  Problem Relation Age of Onset  . Heart disease Unknown   . Diabetes Unknown   . Hypertension Unknown   . Liver cancer Mother   . Arthritis Mother   . Cancer Mother        died at 54  . Heart disease Mother   . Alzheimer's disease Father   . Cancer Brother        lung  . Cancer Son 78       lung  . Hepatitis Brother   . Alcohol abuse Brother   . Heart disease Son 71       heart attack, stents  . Diabetes Son   . Colon cancer Neg Hx     ALLERGIES:  is allergic to penicillins.  MEDICATIONS:  Current Outpatient Medications  Medication Sig Dispense Refill  . aspirin EC 81 MG tablet Take 1 tablet (81 mg total) by mouth daily.    . B Complex Vitamins (B COMPLEX-B12 PO) Take 1 drop by mouth every morning.    . cholecalciferol (VITAMIN D) 1000 units tablet Take 1,000 Units by mouth 2 (two) times daily.    . finasteride (PROSCAR) 5 MG tablet  Take 5 mg by mouth daily.    Marland Kitchen glimepiride (AMARYL) 2 MG tablet Take 1 tablet (2 mg total) by mouth daily with breakfast.    . nitroGLYCERIN (NITROSTAT) 0.4 MG SL tablet Place 1 tablet (0.4 mg total) under the tongue every 5 (five) minutes as needed for chest pain. 25 tablet 3  . omeprazole (PRILOSEC) 20 MG capsule Take 1 capsule (20 mg total) by mouth 2 (two) times daily before a meal. (Patient taking differently: Take 20 mg by mouth daily. ) 60 capsule 0  . sertraline (ZOLOFT) 50 MG tablet Take 50 mg by mouth daily.    . simvastatin (ZOCOR) 80 MG tablet Take 80 mg by mouth at bedtime.    . tamsulosin (FLOMAX) 0.4 MG CAPS Take 0.4 mg by mouth daily after supper.     No current facility-administered medications for this visit.      REVIEW OF SYSTEMS:   Review of Systems  Constitutional: Negative.  Negative for malaise/fatigue.  HENT: Negative.   Eyes: Negative.   Respiratory: Negative.   Cardiovascular: Negative.   Gastrointestinal: Negative.  Negative for abdominal pain, blood in stool and melena.  Genitourinary: Negative.   Musculoskeletal: Negative.   Skin: Negative.   Neurological: Negative.   Endo/Heme/Allergies: Negative.   Psychiatric/Behavioral: Negative.   All other systems reviewed and are negative.  14 point review of systems was performed and is negative except as detailed under history of present illness and above   PHYSICAL EXAMINATION: ECOG PERFORMANCE STATUS: 1 - Symptomatic but completely ambulatory  Blood pressure 152/55, pulse 67, respiratory rate 20, temperature 98, O2 sat 98% on room air, weight 194 pounds.   Physical Exam  Constitutional: He is oriented to person, place, and time and well-developed, well-nourished, and in no distress.  HENT:  Head: Normocephalic and atraumatic.  Mouth/Throat: Oropharynx is clear and moist.  Eyes: Conjunctivae and EOM are normal. Pupils are equal, round, and reactive to light.  Neck: Normal range of motion. Neck supple.   Cardiovascular: Normal rate, regular rhythm and normal heart sounds.  Pulmonary/Chest: Effort normal and breath sounds normal.  Abdominal: Soft. Bowel sounds are normal.  Musculoskeletal: Normal range of motion.  Neurological: He is alert and oriented to person, place, and time. Gait normal.  Skin: Skin is warm  and dry.  Nursing note and vitals reviewed.   LABORATORY DATA:  I have reviewed the data as listed  CBC Latest Ref Rng & Units 11/30/2017 11/30/2017 11/29/2017  WBC 4.0 - 10.5 K/uL - - -  Hemoglobin 13.0 - 17.0 g/dL 9.5(L) 7.6(L) 7.9(L)  Hematocrit 39.0 - 52.0 % 30.4(L) 25.1(L) 25.5(L)  Platelets 150 - 400 K/uL - - -   . CBC    Component Value Date/Time   WBC 3.8 (L) 11/29/2017 0400   RBC 2.25 (L) 11/29/2017 0400   HGB 9.5 (L) 11/30/2017 1726   HCT 30.4 (L) 11/30/2017 1726   HCT 30.2 (L) 08/02/2016 1439   PLT 121 (L) 11/29/2017 0400   MCV 96.4 11/29/2017 0400   MCH 29.3 11/29/2017 0400   MCHC 30.4 11/29/2017 0400   RDW 14.5 11/29/2017 0400   LYMPHSABS 1.0 11/28/2017 1723   MONOABS 0.4 11/28/2017 1723   EOSABS 0.2 11/28/2017 1723   BASOSABS 0.0 11/28/2017 1723    CMP Latest Ref Rng & Units 11/29/2017 11/29/2017 11/28/2017  Glucose 65 - 99 mg/dL 140(H) 90 157(H)  BUN 6 - 20 mg/dL 31(H) 39(H) 38(H)  Creatinine 0.61 - 1.24 mg/dL 2.09(H) 2.47(H) 3.02(H)  Sodium 135 - 145 mmol/L 140 140 139  Potassium 3.5 - 5.1 mmol/L 4.0 3.8 3.9  Chloride 101 - 111 mmol/L 109 110 106  CO2 22 - 32 mmol/L 24 26 25   Calcium 8.9 - 10.3 mg/dL 8.4(L) 8.2(L) 8.7(L)  Total Protein 6.5 - 8.1 g/dL - - 6.9  Total Bilirubin 0.3 - 1.2 mg/dL - - 0.3  Alkaline Phos 38 - 126 U/L - - 62  AST 15 - 41 U/L - - 27  ALT 17 - 63 U/L - - 22       RADIOGRAPHIC STUDIES: I have personally reviewed the radiological images as listed and agreed with the findings in the report. Study Result   CLINICAL DATA:  83 year old male with weakness, dizziness, shortness of breath for 3 weeks. Anemia. Former  smoker. Initial encounter.  EXAM: CHEST  2 VIEW  COMPARISON:  [facility] chest radiographs 06/26/2016 and earlier.  CT Abdomen and Pelvis 06/26/2016  FINDINGS: Stable mediastinal contours. Mild cardiomegaly. Sequelae of CABG. Visualized tracheal air column is within normal limits. Partially visible abdominal aortic endograft. Stable lung volumes. No pneumothorax, pulmonary edema, pleural effusion or acute pulmonary opacity. Calcified thoracic aortic atherosclerosis. No acute osseous abnormality identified. Stable cholecystectomy clips.  IMPRESSION: 1.  No acute cardiopulmonary abnormality. 2. Calcified aortic atherosclerosis.   Electronically Signed   By: Genevie Ann M.D.   On: 07/29/2016 16:19     ASSESSMENT & PLAN:  Anemia, multifactorial due to radiation proctitis, CKD  Stage III chronic kidney disease Low B12 Hypogonadism Iron deficiency Lupron use for prostate carcinoma Thrombocytopenia Colonoscopy 02/2017-severe diverticulosis, radiation proctitis  hematuria  -Most recent acute anemia is likely due to GI bleed. Patient had a capsule endoscopy performed on 11/29/2017 and was found to have no evidence of active bleeding involving the stomach or small bowel. Multiple gastric petechiae without stigmata of bleed. Single small AV malformation in proximal small bowel without stigmata of bleed. Scattered petechiae also noted involving mucosa of small bowel but no stigmata of bleed.  -Proceed with first dose of Feraheme today, we will give him another dose of Feraheme next week.  -RTC in 3 months. Repeat labs prior to his next visit. CBC, CMP, and iron studies.  I have told the patient that if he should have melanotic  stools in the future, prior to his next visit, he is to call us to let us know so that we can do his lab work earlier. He verbalized understanding.  All of the patients questions were answered with apparent satisfaction. The patient knows to  call the clinic with any problems, questions or concerns.   Twana First, MD 12/05/2017 12:48 PM

## 2017-12-05 NOTE — Patient Instructions (Signed)
[facility]  Discharge Instructions:  You were seen by Dr. Zhou today. _______________________________________________________________  Thank you for choosing [facility] to provide your oncology and hematology care.  To afford each patient quality time with our providers, please arrive at least 15 minutes before your scheduled appointment.  You need to re-schedule your appointment if you arrive 10 or more minutes late.  We strive to give you quality time with our providers, and arriving late affects you and other patients whose appointments are after yours.  Also, if you no show three or more times for appointments you may be dismissed from the clinic.  Again, thank you for choosing [facility] Our hope is that these requests will allow you access to exceptional care and in a timely manner. _______________________________________________________________  If you have questions after your visit, please contact our office at [phone] between the hours of 8:30 a.m. and 5:00 p.m. Voicemails left after 4:30 p.m. will not be returned until the following business day. _______________________________________________________________  For prescription refill requests, have your pharmacy contact our office. _______________________________________________________________  Recommendations made by the consultant and any test results will be sent to your referring physician. _______________________________________________________________ 

## 2017-12-05 NOTE — Progress Notes (Signed)
Kai Levins Corum tolerated Feraheme infusion well without complaints or incident. VSS upon discharge. Pt discharged self ambulatory in satisfactory condition accompanied by his wife

## 2017-12-05 NOTE — Patient Instructions (Signed)
[facility] Discharge Instructions  RECOMMENDATIONS MADE BY THE CONSULTANT AND ANY TEST RESULTS WILL BE SENT TO YOUR REFERRING PHYSICIAN.  Received Feraheme infusion today.Follow-up as scheduled. Call clinic for any questions or concerns  Thank you for choosing  [facility] to provide your oncology and hematology care.  To afford each patient quality time with our provider, please arrive at least 15 minutes before your scheduled appointment time.    If you have a lab appointment with the Cancer Center please come in thru the  Main Entrance and check in at the main information desk  You need to re-schedule your appointment should you arrive 10 or more minutes late.  We strive to give you quality time with our providers, and arriving late affects you and other patients whose appointments are after yours.  Also, if you no show three or more times for appointments you may be dismissed from the clinic at the providers discretion.     Again, thank you for choosing [facility].  Our hope is that these requests will decrease the amount of time that you wait before being seen by our physicians.       _____________________________________________________________  Should you have questions after your visit to [facility], please contact our office at [phone] between the hours of 8:30 a.m. and 4:30 p.m.  Voicemails left after 4:30 p.m. will not be returned until the following business day.  For prescription refill requests, have your pharmacy contact our office.       Resources For Cancer Patients and their Caregivers ? American Cancer Society: Can assist with transportation, wigs, general needs, runs Look Good Feel Better.        1-888-227-6333 ? Cancer Care: Provides financial assistance, online support groups, medication/co-pay assistance.  1-800-813-HOPE (4673) ? [facility] Assists [place] cancer patients and their families through emotional , educational and financial support.  336-427-4357 ? [place] DSS Where to apply for food stamps, Medicaid and utility assistance. 336-342-1394 ? [facility]: Transportation to medical appointments. 336-347-2287 ? Social Security Administration: May apply for disability if have a Stage IV cancer. 336-342-7796 1-800-772-1213 ? [place] Aging, Disability and Transit Services: Assists with nutrition, care and transit needs. 336-349-2343  Cancer Center Support Programs: @10RELATIVEDAYS@ > Cancer Support Group  2nd Tuesday of the month 1pm-2pm, Journey Room  > Creative Journey  3rd Tuesday of the month 1130am-1pm, Journey Room  > Look Good Feel Better  1st Wednesday of the month 10am-12 noon, Journey Room (Call American Cancer Society to register 1-800-395-5775)   

## 2017-12-12 ENCOUNTER — Other Ambulatory Visit: Payer: Self-pay

## 2017-12-12 ENCOUNTER — Encounter (HOSPITAL_COMMUNITY): Payer: Self-pay

## 2017-12-12 ENCOUNTER — Ambulatory Visit (HOSPITAL_COMMUNITY): Payer: Medicare Other

## 2017-12-12 ENCOUNTER — Encounter (HOSPITAL_BASED_OUTPATIENT_CLINIC_OR_DEPARTMENT_OTHER): Payer: Medicare Other

## 2017-12-12 VITALS — BP 131/65 | HR 66 | Temp 98.6°F | Resp 16

## 2017-12-12 DIAGNOSIS — D509 Iron deficiency anemia, unspecified: Secondary | ICD-10-CM | POA: Diagnosis not present

## 2017-12-12 DIAGNOSIS — R809 Proteinuria, unspecified: Secondary | ICD-10-CM | POA: Diagnosis not present

## 2017-12-12 DIAGNOSIS — I10 Essential (primary) hypertension: Secondary | ICD-10-CM | POA: Diagnosis not present

## 2017-12-12 DIAGNOSIS — D500 Iron deficiency anemia secondary to blood loss (chronic): Secondary | ICD-10-CM

## 2017-12-12 DIAGNOSIS — E559 Vitamin D deficiency, unspecified: Secondary | ICD-10-CM | POA: Diagnosis not present

## 2017-12-12 DIAGNOSIS — N183 Chronic kidney disease, stage 3 (moderate): Secondary | ICD-10-CM | POA: Diagnosis not present

## 2017-12-12 DIAGNOSIS — Z79899 Other long term (current) drug therapy: Secondary | ICD-10-CM | POA: Diagnosis not present

## 2017-12-12 DIAGNOSIS — Z1159 Encounter for screening for other viral diseases: Secondary | ICD-10-CM | POA: Diagnosis not present

## 2017-12-12 MED ORDER — SODIUM CHLORIDE 0.9 % IV SOLN
510.0000 mg | Freq: Once | INTRAVENOUS | Status: AC
  Administered 2017-12-12: 510 mg via INTRAVENOUS
  Filled 2017-12-12: qty 17

## 2017-12-12 MED ORDER — SODIUM CHLORIDE 0.9 % IV SOLN
Freq: Once | INTRAVENOUS | Status: AC
  Administered 2017-12-12: 11:00:00 via INTRAVENOUS

## 2017-12-12 NOTE — Patient Instructions (Signed)
[facility] Discharge Instructions  RECOMMENDATIONS MADE BY THE CONSULTANT AND ANY TEST RESULTS WILL BE SENT TO YOUR REFERRING PHYSICIAN.  Feraheme given today Follow up as scheduled.  Thank you for choosing [facility] to provide your oncology and hematology care.  To afford each patient quality time with our provider, please arrive at least 15 minutes before your scheduled appointment time.    If you have a lab appointment with the Cancer Center please come in thru the  Main Entrance and check in at the main information desk  You need to re-schedule your appointment should you arrive 10 or more minutes late.  We strive to give you quality time with our providers, and arriving late affects you and other patients whose appointments are after yours.  Also, if you no show three or more times for appointments you may be dismissed from the clinic at the providers discretion.     Again, thank you for choosing [facility].  Our hope is that these requests will decrease the amount of time that you wait before being seen by our physicians.       _____________________________________________________________  Should you have questions after your visit to [facility], please contact our office at [phone] between the hours of 8:30 a.m. and 4:30 p.m.  Voicemails left after 4:30 p.m. will not be returned until the following business day.  For prescription refill requests, have your pharmacy contact our office.       Resources For Cancer Patients and their Caregivers ? American Cancer Society: Can assist with transportation, wigs, general needs, runs Look Good Feel Better.        1-888-227-6333 ? Cancer Care: Provides financial assistance, online support groups, medication/co-pay assistance.  1-800-813-HOPE (4673) ? [facility] Assists [place] cancer patients and  their families through emotional , educational and financial support.  336-427-4357 ? [place] DSS Where to apply for food stamps, Medicaid and utility assistance. 336-342-1394 ? [facility]: Transportation to medical appointments. 336-347-2287 ? Social Security Administration: May apply for disability if have a Stage IV cancer. 336-342-7796 1-800-772-1213 ? [place] Aging, Disability and Transit Services: Assists with nutrition, care and transit needs. 336-349-2343  Cancer Center Support Programs: @10RELATIVEDAYS@ > Cancer Support Group  2nd Tuesday of the month 1pm-2pm, Journey Room  > Creative Journey  3rd Tuesday of the month 1130am-1pm, Journey Room  > Look Good Feel Better  1st Wednesday of the month 10am-12 noon, Journey Room (Call American Cancer Society to register 1-800-395-5775)   

## 2017-12-12 NOTE — Progress Notes (Signed)
Treatment given per orders. Patient tolerated it well without problems. Vitals stable and discharged home from clinic ambulatory. Follow up as scheduled.  

## 2017-12-17 DIAGNOSIS — N200 Calculus of kidney: Secondary | ICD-10-CM | POA: Diagnosis not present

## 2017-12-17 DIAGNOSIS — R809 Proteinuria, unspecified: Secondary | ICD-10-CM | POA: Diagnosis not present

## 2017-12-17 DIAGNOSIS — N184 Chronic kidney disease, stage 4 (severe): Secondary | ICD-10-CM | POA: Diagnosis not present

## 2017-12-17 DIAGNOSIS — D638 Anemia in other chronic diseases classified elsewhere: Secondary | ICD-10-CM | POA: Diagnosis not present

## 2017-12-29 NOTE — Patient Instructions (Signed)
Andrew Zhang  12/29/2017     @PREFPERIOPPHARMACY @   Your procedure is scheduled on  01/07/2018   Report to [name] at  1015   A.M.  Call this number if you have problems the morning of surgery:  [phone]   Remember:  Do not eat food or drink liquids after midnight.  Take these medicines the morning of surgery with A SIP OF WATER  Proscar, prilosec, zoloft, flomax.   Do not wear jewelry, make-up or nail polish.  Do not wear lotions, powders, or perfumes, or deodorant.  Do not shave 48 hours prior to surgery.  Men may shave face and neck.  Do not bring valuables to the hospital.  [facility] is not responsible for any belongings or valuables.  Contacts, dentures or bridgework may not be worn into surgery.  Leave your suitcase in the car.  After surgery it may be brought to your room.  For patients admitted to the hospital, discharge time will be determined by your treatment team.  Patients discharged the day of surgery will not be allowed to drive home.   Name and phone number of your driver:   family Special instructions:  None  Please read over the following fact sheets that you were given. Anesthesia Post-op Instructions and Care and Recovery After Surgery       Cystoscopy Cystoscopy is a procedure that is used to help diagnose and sometimes treat conditions that affect that lower urinary tract. The lower urinary tract includes the bladder and the tube that drains urine from the bladder out of the body (urethra). Cystoscopy is performed with a thin, tube-shaped instrument with a light and camera at the end (cystoscope). The cystoscope may be hard (rigid) or flexible, depending on the goal of the procedure.The cystoscope is inserted through the urethra, into the bladder. Cystoscopy may be recommended if you have:  Urinary tractinfections that keep coming back (recurring).  Blood in the urine (hematuria).  Loss of bladder control  (urinary incontinence) or an overactive bladder.  Unusual cells found in a urine sample.  A blockage in the urethra.  Painful urination.  An abnormality in the bladder found during an intravenous pyelogram (IVP) or CT scan.  Cystoscopy may also be done to remove a sample of tissue to be examined under a microscope (biopsy). Tell a health care provider about:  Any allergies you have.  All medicines you are taking, including vitamins, herbs, eye drops, creams, and over-the-counter medicines.  Any problems you or family members have had with anesthetic medicines.  Any blood disorders you have.  Any surgeries you have had.  Any medical conditions you have.  Whether you are pregnant or may be pregnant. What are the risks? Generally, this is a safe procedure. However, problems may occur, including:  Infection.  Bleeding.  Allergic reactions to medicines.  Damage to other structures or organs.  What happens before the procedure?  Ask your health care provider about: ? Changing or stopping your regular medicines. This is especially important if you are taking diabetes medicines or blood thinners. ? Taking medicines such as aspirin and ibuprofen. These medicines can thin your blood. Do not take these medicines before your procedure if your health care provider instructs you not to.  Follow instructions from your health care provider about eating or drinking restrictions.  You may be given antibiotic medicine to help prevent infection.  You may  have an exam or testing, such as X-rays of the bladder, urethra, or kidneys.  You may have urine tests to check for signs of infection.  Plan to have someone take you home after the procedure. What happens during the procedure?  To reduce your risk of infection,your health care team will wash or sanitize their hands.  You will be given one or more of the following: ? A medicine to help you relax (sedative). ? A medicine to numb  the area (local anesthetic).  The area around the opening of your urethra will be cleaned.  The cystoscope will be passed through your urethra into your bladder.  Germ-free (sterile)fluid will flow through the cystoscope to fill your bladder. The fluid will stretch your bladder so that your surgeon can clearly examine your bladder walls.  The cystoscope will be removed and your bladder will be emptied. The procedure may vary among health care providers and hospitals. What happens after the procedure?  You may have some soreness or pain in your abdomen and urethra. Medicines will be available to help you.  You may have some blood in your urine.  Do not drive for 24 hours if you received a sedative. This information is not intended to replace advice given to you by your health care provider. Make sure you discuss any questions you have with your health care provider. Document Released: 12/13/2000 Document Revised: 04/25/2016 Document Reviewed: 11/02/2015 Elsevier Interactive Patient Education  2018 [employer].  Cystoscopy, Care After Refer to this sheet in the next few weeks. These instructions provide you with information about caring for yourself after your procedure. Your health care provider may also give you more specific instructions. Your treatment has been planned according to current medical practices, but problems sometimes occur. Call your health care provider if you have any problems or questions after your procedure. What can I expect after the procedure? After the procedure, it is common to have:  Mild pain when you urinate. Pain should stop within a few minutes after you urinate. This may last for up to 1 week.  A small amount of blood in your urine for several days.  Feeling like you need to urinate but producing only a small amount of urine.  Follow these instructions at home:  Medicines  Take over-the-counter and prescription medicines only as told by your  health care provider.  If you were prescribed an antibiotic medicine, take it as told by your health care provider. Do not stop taking the antibiotic even if you start to feel better. General instructions   Return to your normal activities as told by your health care provider. Ask your health care provider what activities are safe for you.  Do not drive for 24 hours if you received a sedative.  Watch for any blood in your urine. If the amount of blood in your urine increases, call your health care provider.  Follow instructions from your health care provider about eating or drinking restrictions.  If a tissue sample was removed for testing (biopsy) during your procedure, it is your responsibility to get your test results. Ask your health care provider or the department performing the test when your results will be ready.  Drink enough fluid to keep your urine clear or pale yellow.  Keep all follow-up visits as told by your health care provider. This is important. Contact a health care provider if:  You have pain that gets worse or does not get better with medicine, especially pain  when you urinate.  You have difficulty urinating. Get help right away if:  You have more blood in your urine.  You have blood clots in your urine.  You have abdominal pain.  You have a fever or chills.  You are unable to urinate. This information is not intended to replace advice given to you by your health care provider. Make sure you discuss any questions you have with your health care provider. Document Released: 07/05/2005 Document Revised: 05/23/2016 Document Reviewed: 11/02/2015 Elsevier Interactive Patient Education  2018 [employer].  Ureteral Stent Implantation Ureteral stent implantation is a procedure to insert (implant) a flexible, soft, plastic tube (stent) into a tube (ureter) that drains urine from the kidneys. The stent supports the ureter while it heals and helps to drain urine  from the kidneys. You may have a ureteral stent implanted after having a procedure to remove a blockage from the ureter (ureterolysis or pyeloplasty).You may also have a stent implanted to open the flow of urine when you have a blockage caused by a kidney stone, tumor, blood clot, or infection. You have two ureters, one on each side of the body. The ureters connect the kidneys to the organ that holds urine until it passes out of the body (bladder). The stent is placed so that one end is in the kidney, and one end is in the bladder. The stent is usually taken out after your ureter has healed. Depending on your condition, you may have a stent for just a few weeks, or you may have a long-term stent that will need to be replaced every few months. Tell a health care provider about:  Any allergies you have.  All medicines you are taking, including vitamins, herbs, eye drops, creams, and over-the-counter medicines.  Any problems you or family members have had with anesthetic medicines.  Any blood disorders you have.  Any surgeries you have had.  Any medical conditions you have.  Whether you are pregnant or may be pregnant. What are the risks? Generally, this is a safe procedure. However, problems may occur, including:  Infection.  Bleeding.  Allergic reactions to medicines.  Damage to other structures or organs. Tearing (perforation) of the ureter is possible.  Movement of the stent away from where it is placed during surgery (migration).  What happens before the procedure?  Ask your health care provider about: ? Changing or stopping your regular medicines. This is especially important if you are taking diabetes medicines or blood thinners. ? Taking medicines such as aspirin and ibuprofen. These medicines can thin your blood. Do not take these medicines before your procedure if your health care provider instructs you not to.  Follow instructions from your health care provider about  eating or drinking restrictions.  Do not drink alcohol and do not use any tobacco products before your procedure, as told by your health care provider.  You may be given antibiotic medicine to help prevent infection.  Plan to have someone take you home after the procedure.  If you go home right after the procedure, plan to have someone with you for 24 hours. What happens during the procedure?  An IV tube will be inserted into one of your veins.  You will be given a medicine to make you fall asleep (general anesthetic). You may also be given a medicine to help you relax (sedative).  A thin, tube-shaped instrument with a light and tiny camera at the end (cystoscope) will be inserted into your urethra. The urethra  is the tube that drains urine from the bladder out of the body. In men, the urethra opens at the end of the penis. In women, the urethra opens in front of the vaginal opening.  The cystoscope will be passed into your bladder.  A thin wire (guide wire) will be passed through your bladder and into your ureter. This is used to guide the stent into your ureter.  The stent will be inserted into your ureter.  The guide wire and the cystoscope will be removed.  A flexible tube (catheter) will be inserted through your urethra so that one end is in your bladder. This helps to drain urine from your bladder. The procedure may vary among hospitals and health care providers. What happens after the procedure?  Your blood pressure, heart rate, breathing rate, and blood oxygen level will be monitored often until the medicines you were given have worn off.  You may continue to receive medicine and fluids through an IV tube.  You may have some soreness or pain in your abdomen and urethra. Medicines will be available to help you.  You will be encouraged to get up and walk around as soon as you can.  You will have a catheter draining your urine.  You will have some blood in your  urine.  Do not drive for 24 hours if you received a sedative. This information is not intended to replace advice given to you by your health care provider. Make sure you discuss any questions you have with your health care provider. Document Released: 12/13/2000 Document Revised: 05/23/2016 Document Reviewed: 06/30/2015 Elsevier Interactive Patient Education  2018 [place].  Ureteral Stent Implantation, Care After Refer to this sheet in the next few weeks. These instructions provide you with information about caring for yourself after your procedure. Your health care provider may also give you more specific instructions. Your treatment has been planned according to current medical practices, but problems sometimes occur. Call your health care provider if you have any problems or questions after your procedure. What can I expect after the procedure? After the procedure, it is common to have:  Nausea.  Mild pain when you urinate. You may feel this pain in your lower back or lower abdomen. Pain should stop within a few minutes after you urinate. This may last for up to 1 week.  A small amount of blood in your urine for several days.  Follow these instructions at home:  Medicines  Take over-the-counter and prescription medicines only as told by your health care provider.  If you were prescribed an antibiotic medicine, take it as told by your health care provider. Do not stop taking the antibiotic even if you start to feel better.  Do not drive for 24 hours if you received a sedative.  Do not drive or operate heavy machinery while taking prescription pain medicines. Activity  Return to your normal activities as told by your health care provider. Ask your health care provider what activities are safe for you.  Do not lift anything that is heavier than 10 lb (4.5 kg). Follow this limit for 1 week after your procedure, or for as long as told by your health care provider. General  instructions  Watch for any blood in your urine. Call your health care provider if the amount of blood in your urine increases.  If you have a catheter: ? Follow instructions from your health care provider about taking care of your catheter and collection bag. ? Do  not take baths, swim, or use a hot tub until your health care provider approves.  Drink enough fluid to keep your urine clear or pale yellow.  Keep all follow-up visits as told by your health care provider. This is important. Contact a health care provider if:  You have pain that gets worse or does not get better with medicine, especially pain when you urinate.  You have difficulty urinating.  You feel nauseous or you vomit repeatedly during a period of more than 2 days after the procedure. Get help right away if:  Your urine is dark red or has blood clots in it.  You are leaking urine (have incontinence).  The end of the stent comes out of your urethra.  You cannot urinate.  You have sudden, sharp, or severe pain in your abdomen or lower back.  You have a fever. This information is not intended to replace advice given to you by your health care provider. Make sure you discuss any questions you have with your health care provider. Document Released: 08/18/2013 Document Revised: 05/23/2016 Document Reviewed: 06/30/2015 Elsevier Interactive Patient Education  2018 [place] Anesthesia, Adult General anesthesia is the use of medicines to make a person "go to sleep" (be unconscious) for a medical procedure. General anesthesia is often recommended when a procedure:  Is long.  Requires you to be still or in an unusual position.  Is major and can cause you to lose blood.  Is impossible to do without general anesthesia.  The medicines used for general anesthesia are called general anesthetics. In addition to making you sleep, the medicines:  Prevent pain.  Control your blood pressure.  Relax your  muscles.  Tell a health care provider about:  Any allergies you have.  All medicines you are taking, including vitamins, herbs, eye drops, creams, and over-the-counter medicines.  Any problems you or family members have had with anesthetic medicines.  Types of anesthetics you have had in the past.  Any bleeding disorders you have.  Any surgeries you have had.  Any medical conditions you have.  Any history of heart or lung conditions, such as heart failure, sleep apnea, or chronic obstructive pulmonary disease (COPD).  Whether you are pregnant or may be pregnant.  Whether you use tobacco, alcohol, marijuana, or street drugs.  Any history of Armed forces logistics/support/administrative officer.  Any history of depression or anxiety. What are the risks? Generally, this is a safe procedure. However, problems may occur, including:  Allergic reaction to anesthetics.  Lung and heart problems.  Inhaling food or liquids from your stomach into your lungs (aspiration).  Injury to nerves.  Waking up during your procedure and being unable to move (rare).  Extreme agitation or a state of mental confusion (delirium) when you wake up from the anesthetic.  Air in the bloodstream, which can lead to stroke.  These problems are more likely to develop if you are having a major surgery or if you have an advanced medical condition. You can prevent some of these complications by answering all of your health care provider's questions thoroughly and by following all pre-procedure instructions. General anesthesia can cause side effects, including:  Nausea or vomiting  A sore throat from the breathing tube.  Feeling cold or shivery.  Feeling tired, washed out, or achy.  Sleepiness or drowsiness.  Confusion or agitation.  What happens before the procedure? Staying hydrated Follow instructions from your health care provider about hydration, which may include:  Up to 2 hours  before the procedure - you may continue to  drink clear liquids, such as water, clear fruit juice, black coffee, and plain tea.  Eating and drinking restrictions Follow instructions from your health care provider about eating and drinking, which may include:  8 hours before the procedure - stop eating heavy meals or foods such as meat, fried foods, or fatty foods.  6 hours before the procedure - stop eating light meals or foods, such as toast or cereal.  6 hours before the procedure - stop drinking milk or drinks that contain milk.  2 hours before the procedure - stop drinking clear liquids.  Medicines  Ask your health care provider about: ? Changing or stopping your regular medicines. This is especially important if you are taking diabetes medicines or blood thinners. ? Taking medicines such as aspirin and ibuprofen. These medicines can thin your blood. Do not take these medicines before your procedure if your health care provider instructs you not to. ? Taking new dietary supplements or medicines. Do not take these during the week before your procedure unless your health care provider approves them.  If you are told to take a medicine or to continue taking a medicine on the day of the procedure, take the medicine with sips of water. General instructions   Ask if you will be going home the same day, the following day, or after a longer hospital stay. ? Plan to have someone take you home. ? Plan to have someone stay with you for the first 24 hours after you leave the hospital or clinic.  For 3-6 weeks before the procedure, try not to use any tobacco products, such as cigarettes, chewing tobacco, and e-cigarettes.  You may brush your teeth on the morning of the procedure, but make sure to spit out the toothpaste. What happens during the procedure?  You will be given anesthetics through a mask and through an IV tube in one of your veins.  You may receive medicine to help you relax (sedative).  As soon as you are asleep, a  breathing tube may be used to help you breathe.  An anesthesia specialist will stay with you throughout the procedure. He or she will help keep you comfortable and safe by continuing to give you medicines and adjusting the amount of medicine that you get. He or she will also watch your blood pressure, pulse, and oxygen levels to make sure that the anesthetics do not cause any problems.  If a breathing tube was used to help you breathe, it will be removed before you wake up. The procedure may vary among health care providers and hospitals. What happens after the procedure?  You will wake up, often slowly, after the procedure is complete, usually in a recovery area.  Your blood pressure, heart rate, breathing rate, and blood oxygen level will be monitored until the medicines you were given have worn off.  You may be given medicine to help you calm down if you feel anxious or agitated.  If you will be going home the same day, your health care provider may check to make sure you can stand, drink, and urinate.  Your health care providers will treat your pain and side effects before you go home.  Do not drive for 24 hours if you received a sedative.  You may: ? Feel nauseous and vomit. ? Have a sore throat. ? Have mental slowness. ? Feel cold or shivery. ? Feel sleepy. ? Feel tired. ? Feel sore or  achy, even in parts of your body where you did not have surgery. This information is not intended to replace advice given to you by your health care provider. Make sure you discuss any questions you have with your health care provider. Document Released: 03/24/2008 Document Revised: 05/28/2016 Document Reviewed: 11/30/2015 Elsevier Interactive Patient Education  2018 [place] Anesthesia, Adult, Care After These instructions provide you with information about caring for yourself after your procedure. Your health care provider may also give you more specific instructions. Your  treatment has been planned according to current medical practices, but problems sometimes occur. Call your health care provider if you have any problems or questions after your procedure. What can I expect after the procedure? After the procedure, it is common to have:  Vomiting.  A sore throat.  Mental slowness.  It is common to feel:  Nauseous.  Cold or shivery.  Sleepy.  Tired.  Sore or achy, even in parts of your body where you did not have surgery.  Follow these instructions at home: For at least 24 hours after the procedure:  Do not: ? Participate in activities where you could fall or become injured. ? Drive. ? Use heavy machinery. ? Drink alcohol. ? Take sleeping pills or medicines that cause drowsiness. ? Make important decisions or sign legal documents. ? Take care of children on your own.  Rest. Eating and drinking  If you vomit, drink water, juice, or soup when you can drink without vomiting.  Drink enough fluid to keep your urine clear or pale yellow.  Make sure you have little or no nausea before eating solid foods.  Follow the diet recommended by your health care provider. General instructions  Have a responsible adult stay with you until you are awake and alert.  Return to your normal activities as told by your health care provider. Ask your health care provider what activities are safe for you.  Take over-the-counter and prescription medicines only as told by your health care provider.  If you smoke, do not smoke without supervision.  Keep all follow-up visits as told by your health care provider. This is important. Contact a health care provider if:  You continue to have nausea or vomiting at home, and medicines are not helpful.  You cannot drink fluids or start eating again.  You cannot urinate after 8-12 hours.  You develop a skin rash.  You have fever.  You have increasing redness at the site of your procedure. Get help right  away if:  You have difficulty breathing.  You have chest pain.  You have unexpected bleeding.  You feel that you are having a life-threatening or urgent problem. This information is not intended to replace advice given to you by your health care provider. Make sure you discuss any questions you have with your health care provider. Document Released: 03/24/2001 Document Revised: 05/20/2016 Document Reviewed: 11/30/2015 Elsevier Interactive Patient Education  [employer].

## 2017-12-31 ENCOUNTER — Other Ambulatory Visit: Payer: Self-pay

## 2017-12-31 ENCOUNTER — Ambulatory Visit (INDEPENDENT_AMBULATORY_CARE_PROVIDER_SITE_OTHER): Payer: Medicare Other | Admitting: Gastroenterology

## 2017-12-31 ENCOUNTER — Encounter: Payer: Self-pay | Admitting: Gastroenterology

## 2017-12-31 VITALS — BP 130/83 | HR 74 | Temp 96.9°F | Ht 74.0 in | Wt 189.6 lb

## 2017-12-31 DIAGNOSIS — D500 Iron deficiency anemia secondary to blood loss (chronic): Secondary | ICD-10-CM

## 2017-12-31 DIAGNOSIS — D509 Iron deficiency anemia, unspecified: Secondary | ICD-10-CM

## 2017-12-31 NOTE — Patient Instructions (Signed)
We will check blood work in February. You also have blood work planned in March with Hematology.   We will see you back in 4 months!

## 2017-12-31 NOTE — Progress Notes (Signed)
Referring Provider: [name], MD Primary Care Physician:  [name], MD  Primary GI: Dr. Oneida Alar   Chief Complaint  Patient presents with  . Melena    f/u, doing ok    HPI:   Andrew Zhang is an 84 y.o. male presenting today with a history of IDA, transfusion-dependent anemia, extensive GI evaluation for transfusion dependent anemia to include EGD, colonoscopy, capsule study. Colonoscopy with radiation proctitis s/p APC.  He was asked to stop aspirin at last visit in June 2018. Inpatient Dec 2018 for symptomatic anemia. Updated capsule study completed, with multiple gastric petechia but no stigmata of bleed. Single small AVM in proximal small bowel without bleeding stigmata. Scattered petechia involving mucosa of small bowel. He is followed closely by Hematology for iron infusions. He also has had gross hematuria and has been followed by urology with intervention as noted in [facility]. Last iron infusion was Dec 12, 2017.   Back on 81 mg aspirin. Prilosec 20 mg daily. No melena. No abdominal pain, N/V. Brought outside labs from Dec 12, 2017. Hgb 11.2, ferritin elevated at 478 but had just received iron infusion. Cr 2.33. Will scan into epic. Discussed close monitoring of CBC. States he feels asymptomatic currently. Getting ready to celebrate 71 years of marriage.    Past Medical History:  Diagnosis Date  . AAA (abdominal aortic aneurysm) ([place])   . Anemia   . Arthritis   . B12 deficiency   . Blood transfusion without reported diagnosis   . CAD (coronary artery disease)   . Cancer [facility])    radiation for prostate cancer and lupron.  . Cataract   . Complication of anesthesia    sts,"after hearat surgery my stomach didnt wake up like it was supossed to".  . Depression   . Diverticulitis   . DMII (diabetes mellitus, type 2) ([place]) 2008  . GERD (gastroesophageal reflux disease)   . GI bleed   . GIB (gastrointestinal bleeding) 09/18/2016  . Hiatal hernia   . History  of kidney stones   . HTN (hypertension)   . Hyperlipidemia   . Iron deficiency anemia due to chronic blood loss 01/08/2017  . Kidney stone   . PONV (postoperative nausea and vomiting)   . Prostate cancer ([place])   . Sleep apnea    cannot tolerate, PCP aware  . Ureteral stricture, left     Past Surgical History:  Procedure Laterality Date  . APPENDECTOMY    . BACK SURGERY     x2  . CATARACT EXTRACTION Left   . CHOLECYSTECTOMY    . COLONOSCOPY  03/2015   Dr. Britta Mccreedy: Diverticulosis, single tubular adenoma removed.  . COLONOSCOPY WITH PROPOFOL N/A 02/25/2017   Dr. Oneida Alar: non-thrombosed external hemorrhoids, significant looping of left colon. severe diverticulosis in recto-sigmoid colon and sigmoid colon. 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 CORONARY ARTERY BYPASS GRAFT     1995  . CYSTOSCOPY     with stent-left kidney. x2  . CYSTOSCOPY W/ URETERAL STENT PLACEMENT Left 05/14/2017   Procedure: CYSTOSCOPY WITH RETROGRADE PYELOGRAM/URETERAL STENT EXCHANGE;  Surgeon: Cleon Gustin, MD;  Location: [facility];  Service: Urology;  Laterality: Left;  . CYSTOSCOPY W/ URETERAL STENT PLACEMENT Left 10/13/2017   Procedure: CYSTOSCOPY WITH LEFT RETROGRADE PYELOGRAM/LEFT URETERAL STENT REPLACEMENT;  Surgeon: Cleon Gustin, MD;  Location: [facility];  Service: Urology;  Laterality: Left;  . [place]  08/13/2017   Procedure: CYSTOSCOPY WITH FULGERATION OF BLADDER;  Surgeon: Cleon Gustin, MD;  Location: [facility];  Service: Urology;;  . Consuela Mimes WITH RETROGRADE PYELOGRAM, URETEROSCOPY AND STENT PLACEMENT Left 08/13/2017   Procedure: CYSTOSCOPY WITH LEFT RETROGRADE PYELOGRAM, LEFT URETEROSCOPY AND STENT REPLACEMENT;  Surgeon: Cleon Gustin, MD;  Location: [facility];  Service: Urology;  Laterality: Left;  . DESCENDING AORTIC ANEURYSM REPAIR W/ STENT     x2  .  ESOPHAGOGASTRODUODENOSCOPY N/A 07/30/2016   Dr. Oneida Alar. Nonobstructing Schatzki ring at GE junction, multiple small sessile polyps with no bleeding or stigmata of recent bleeding in the gastric fundus and gastric body. Benign-appearing intrinsic moderate stenosis found the pylorus status post dilation. Small bowel capsule deployed.  Marland Kitchen GIVENS CAPSULE STUDY     Capsule study is complete to the cecum. No obvious lesions, mass, tumors. Small wisps of blood noted in small bowel secondary to EGD/dilation. Possible few erosions in setting of NSAIDs. No obvious areas of bleeding.  Marland Kitchen GIVENS CAPSULE STUDY N/A 11/29/2017   Dr. Laural Golden: no evidence of active bleeding in stomach or small bowel. Multiple gastric petechia, single small AVM in proximal small bowel, scattered petechia involving small bowel mucosa.   Marland Kitchen HERNIA REPAIR    . HOLMIUM LASER APPLICATION Left 1/69/6789   Procedure: HOLMIUM LASER LITHOTRIPSY OF ENCRUSTED LEFT URETERAL STENT;  Surgeon: Cleon Gustin, MD;  Location: [facility];  Service: Urology;  Laterality: Left;  . SPINE SURGERY     ruptured discs    Current Outpatient Medications  Medication Sig Dispense Refill  . acetaminophen (TYLENOL) 325 MG tablet Take 325 mg by mouth every 6 (six) hours as needed for moderate pain or headache.    Marland Kitchen aspirin EC 81 MG tablet Take 1 tablet (81 mg total) by mouth daily.    . B Complex Vitamins (B COMPLEX-B12 PO) Take 1 drop by mouth daily.     . cholecalciferol (VITAMIN D) 1000 units tablet Take 1,000 Units by mouth 2 (two) times daily.    . finasteride (PROSCAR) 5 MG tablet Take 5 mg by mouth daily.    Marland Kitchen glimepiride (AMARYL) 2 MG tablet Take 1 tablet (2 mg total) by mouth daily with breakfast. (Patient taking differently: Take 2 mg by mouth 2 (two) times daily. )    . Naphazoline-Pheniramine (OPCON-A) 0.027-0.315 % SOLN Place 1 drop into both eyes daily as needed (for dry eyes).    . nitroGLYCERIN (NITROSTAT) 0.4 MG SL tablet Place 1 tablet (0.4 mg  total) under the tongue every 5 (five) minutes as needed for chest pain. 25 tablet 3  . omeprazole (PRILOSEC) 20 MG capsule Take 1 capsule (20 mg total) by mouth 2 (two) times daily before a meal. (Patient taking differently: Take 20 mg by mouth daily. ) 60 capsule 0  . sertraline (ZOLOFT) 50 MG tablet Take 50 mg by mouth daily.    . simvastatin (ZOCOR) 80 MG tablet Take 80 mg by mouth at bedtime.    . tamsulosin (FLOMAX) 0.4 MG CAPS Take 0.4 mg by mouth daily after supper.     No current facility-administered medications for this visit.     Allergies as of 12/31/2017 - Review Complete 12/31/2017  Allergen Reaction Noted  . Penicillins Rash and Other (See Comments) 08/27/2013    Family History  Problem Relation Age of Onset  . Heart disease Unknown   . Diabetes Unknown   . Hypertension Unknown   . Liver cancer Mother   . Arthritis Mother   .  Cancer Mother        died at 75  . Heart disease Mother   . Alzheimer's disease Father   . Cancer Brother        lung  . Cancer Son 42       lung  . Hepatitis Brother   . Alcohol abuse Brother   . Heart disease Son 16       heart attack, stents  . Diabetes Son   . Colon cancer Neg Hx     Social History   Socioeconomic History  . Marital status: Married    Spouse name: [name]  . Number of children: 5  . Years of education: 3- GED  . Highest education level: None  Social Needs  . Financial resource strain: None  . Food insecurity - worry: None  . Food insecurity - inability: None  . Transportation needs - medical: None  . Transportation needs - non-medical: None  Occupational History  . Occupation: retired    Comment: Contractor  Tobacco Use  . Smoking status: Former Smoker    Packs/day: 3.00    Years: 45.00    Pack years: 135.00    Types: Cigarettes    Start date: 04/19/1951    Last attempt to quit: 03/01/1994    Years since quitting: 23.8  . Smokeless tobacco: Never Used  Substance and Sexual Activity  . Alcohol use:  Yes    Alcohol/week: 0.0 oz    Comment: 1-2 beers daily   . Drug use: No  . Sexual activity: Yes    Birth control/protection: None    Comment: married 59 years - 5 children   Other Topics Concern  . None  Social History Narrative   Married to [place] for 60+ years   4 years in the [place]   Retired, mows grass    Review of Systems: Gen: Denies fever, chills, anorexia. Denies fatigue, weakness, weight loss.  CV: Denies chest pain, palpitations, syncope, peripheral edema, and claudication. Resp: Denies dyspnea at rest, cough, wheezing, coughing up blood, and pleurisy. GI: see HPI  Derm: Denies rash, itching, dry skin Psych: Denies depression, anxiety, memory loss, confusion. No homicidal or suicidal ideation.  Heme: see HPI   Physical Exam: BP 130/83   Pulse 74   Temp (!) 96.9 F (36.1 C) (Oral)   Ht 6\' 2"  (1.88 m)   Wt 189 lb 9.6 oz (86 kg)   BMI 24.34 kg/m  General:   Alert and oriented. No distress noted. Pleasant and cooperative.  Head:  Normocephalic and atraumatic. Eyes:  Conjuctiva clear without scleral icterus. Mouth:  Oral mucosa pink and moist.  Abdomen:  +BS, soft, non-tender and non-distended. No rebound or guarding. Query ventral hernia Msk:  Slight kyphosis  Extremities:  Without edema. Neurologic:  Alert and  oriented x4 Psych:  Alert and cooperative. Normal mood and affect.

## 2017-12-31 NOTE — Assessment & Plan Note (Signed)
84 year old male with prior extensive history and evaluation by GI, most recently inpatient Dec 2018 with symptomatic anemia requiring blood transfusion. Capsule study completed without stigmata of bleeding but multiple probable sources (AVM, history of radiation proctitis). He also has history of hematuria and followed by Urology with upcoming stent exchange planned. No gross hematuria noted. He is back on 81 mg aspirin daily and is on a PPI. I will reach out to cardiology about risks and benefits of continuing this, as he does have a significant cardiac history. In interim, we will follow CBC, iron studies closely. Labs to be completed in February with [place] and March with Hematology. Return in 4 months.

## 2017-12-31 NOTE — Progress Notes (Signed)
cc'ed to pcp °

## 2018-01-02 ENCOUNTER — Encounter (HOSPITAL_COMMUNITY): Payer: Self-pay

## 2018-01-02 ENCOUNTER — Encounter (HOSPITAL_COMMUNITY)
Admission: RE | Admit: 2018-01-02 | Discharge: 2018-01-02 | Disposition: A | Discharge status: Home / Self Care | Payer: Medicare Other | Source: Ambulatory Visit | Attending: Urology | Admitting: Urology

## 2018-01-02 DIAGNOSIS — Z951 Presence of aortocoronary bypass graft: Secondary | ICD-10-CM | POA: Insufficient documentation

## 2018-01-02 DIAGNOSIS — I10 Essential (primary) hypertension: Secondary | ICD-10-CM | POA: Diagnosis not present

## 2018-01-02 DIAGNOSIS — E785 Hyperlipidemia, unspecified: Secondary | ICD-10-CM | POA: Insufficient documentation

## 2018-01-02 DIAGNOSIS — Z7982 Long term (current) use of aspirin: Secondary | ICD-10-CM | POA: Diagnosis not present

## 2018-01-02 DIAGNOSIS — Z01818 Encounter for other preprocedural examination: Secondary | ICD-10-CM | POA: Insufficient documentation

## 2018-01-02 DIAGNOSIS — Z79899 Other long term (current) drug therapy: Secondary | ICD-10-CM | POA: Diagnosis not present

## 2018-01-02 DIAGNOSIS — I2510 Atherosclerotic heart disease of native coronary artery without angina pectoris: Secondary | ICD-10-CM | POA: Diagnosis not present

## 2018-01-02 DIAGNOSIS — N135 Crossing vessel and stricture of ureter without hydronephrosis: Secondary | ICD-10-CM | POA: Insufficient documentation

## 2018-01-02 DIAGNOSIS — E119 Type 2 diabetes mellitus without complications: Secondary | ICD-10-CM | POA: Insufficient documentation

## 2018-01-02 LAB — BASIC METABOLIC PANEL
ANION GAP: 11 (ref 5–15)
BUN: 44 mg/dL — AB (ref 6–20)
CALCIUM: 9.4 mg/dL (ref 8.9–10.3)
CO2: 23 mmol/L (ref 22–32)
Chloride: 106 mmol/L (ref 101–111)
Creatinine, Ser: 2.38 mg/dL — ABNORMAL HIGH (ref 0.61–1.24)
GFR calc Af Amer: 28 mL/min — ABNORMAL LOW (ref >60)
GFR, EST NON AFRICAN AMERICAN: 24 mL/min — AB (ref >60)
GLUCOSE: 219 mg/dL — AB (ref 65–99)
Potassium: 4 mmol/L (ref 3.5–5.1)
SODIUM: 140 mmol/L (ref 135–145)

## 2018-01-02 LAB — CBC WITH DIFFERENTIAL/PLATELET
BASOS ABS: 0 10*3/uL (ref 0.0–0.1)
Basophils Relative: 0 %
EOS ABS: 0.2 10*3/uL (ref 0.0–0.7)
EOS PCT: 3 %
HCT: 35.8 % — ABNORMAL LOW (ref 39.0–52.0)
Hemoglobin: 11.4 g/dL — ABNORMAL LOW (ref 13.0–17.0)
LYMPHS PCT: 18 %
Lymphs Abs: 1 10*3/uL (ref 0.7–4.0)
MCH: 29.8 pg (ref 26.0–34.0)
MCHC: 31.8 g/dL (ref 30.0–36.0)
MCV: 93.5 fL (ref 78.0–100.0)
MONO ABS: 0.3 10*3/uL (ref 0.1–1.0)
Monocytes Relative: 6 %
Neutro Abs: 4 10*3/uL (ref 1.7–7.7)
Neutrophils Relative %: 73 %
PLATELETS: 154 10*3/uL (ref 150–400)
RBC: 3.83 MIL/uL — ABNORMAL LOW (ref 4.22–5.81)
RDW: 16.8 % — ABNORMAL HIGH (ref 11.5–15.5)
WBC: 5.4 10*3/uL (ref 4.0–10.5)

## 2018-01-02 LAB — GLUCOSE, CAPILLARY: GLUCOSE-CAPILLARY: 202 mg/dL — AB (ref 65–99)

## 2018-01-03 LAB — HEMOGLOBIN A1C
HEMOGLOBIN A1C: 5.6 % (ref 4.8–5.6)
MEAN PLASMA GLUCOSE: 114.02 mg/dL

## 2018-01-07 ENCOUNTER — Ambulatory Visit (HOSPITAL_COMMUNITY): Payer: Medicare Other

## 2018-01-07 ENCOUNTER — Encounter (HOSPITAL_COMMUNITY): Payer: Self-pay | Admitting: *Deleted

## 2018-01-07 ENCOUNTER — Ambulatory Visit (HOSPITAL_COMMUNITY): Payer: Medicare Other | Admitting: Anesthesiology

## 2018-01-07 ENCOUNTER — Encounter (HOSPITAL_COMMUNITY)
Admission: RE | Disposition: A | Discharge status: Home / Self Care | Payer: Self-pay | Source: Ambulatory Visit | Attending: Urology

## 2018-01-07 ENCOUNTER — Ambulatory Visit (HOSPITAL_COMMUNITY)
Admission: RE | Admit: 2018-01-07 | Discharge: 2018-01-07 | Disposition: A | Discharge status: Home / Self Care | Payer: Medicare Other | Source: Ambulatory Visit | Attending: Urology | Admitting: Urology

## 2018-01-07 DIAGNOSIS — I129 Hypertensive chronic kidney disease with stage 1 through stage 4 chronic kidney disease, or unspecified chronic kidney disease: Secondary | ICD-10-CM | POA: Diagnosis not present

## 2018-01-07 DIAGNOSIS — E785 Hyperlipidemia, unspecified: Secondary | ICD-10-CM | POA: Diagnosis not present

## 2018-01-07 DIAGNOSIS — Z951 Presence of aortocoronary bypass graft: Secondary | ICD-10-CM | POA: Diagnosis not present

## 2018-01-07 DIAGNOSIS — Z9842 Cataract extraction status, left eye: Secondary | ICD-10-CM | POA: Diagnosis not present

## 2018-01-07 DIAGNOSIS — K449 Diaphragmatic hernia without obstruction or gangrene: Secondary | ICD-10-CM | POA: Diagnosis not present

## 2018-01-07 DIAGNOSIS — I2510 Atherosclerotic heart disease of native coronary artery without angina pectoris: Secondary | ICD-10-CM | POA: Insufficient documentation

## 2018-01-07 DIAGNOSIS — D631 Anemia in chronic kidney disease: Secondary | ICD-10-CM | POA: Insufficient documentation

## 2018-01-07 DIAGNOSIS — Z9889 Other specified postprocedural states: Secondary | ICD-10-CM | POA: Diagnosis not present

## 2018-01-07 DIAGNOSIS — I714 Abdominal aortic aneurysm, without rupture: Secondary | ICD-10-CM | POA: Insufficient documentation

## 2018-01-07 DIAGNOSIS — Z8249 Family history of ischemic heart disease and other diseases of the circulatory system: Secondary | ICD-10-CM | POA: Diagnosis not present

## 2018-01-07 DIAGNOSIS — N189 Chronic kidney disease, unspecified: Secondary | ICD-10-CM | POA: Insufficient documentation

## 2018-01-07 DIAGNOSIS — F329 Major depressive disorder, single episode, unspecified: Secondary | ICD-10-CM | POA: Diagnosis not present

## 2018-01-07 DIAGNOSIS — G4730 Sleep apnea, unspecified: Secondary | ICD-10-CM | POA: Insufficient documentation

## 2018-01-07 DIAGNOSIS — Z466 Encounter for fitting and adjustment of urinary device: Secondary | ICD-10-CM | POA: Diagnosis not present

## 2018-01-07 DIAGNOSIS — M1990 Unspecified osteoarthritis, unspecified site: Secondary | ICD-10-CM | POA: Diagnosis not present

## 2018-01-07 DIAGNOSIS — Z880 Allergy status to penicillin: Secondary | ICD-10-CM | POA: Insufficient documentation

## 2018-01-07 DIAGNOSIS — N132 Hydronephrosis with renal and ureteral calculous obstruction: Secondary | ICD-10-CM | POA: Diagnosis not present

## 2018-01-07 DIAGNOSIS — Z8379 Family history of other diseases of the digestive system: Secondary | ICD-10-CM | POA: Insufficient documentation

## 2018-01-07 DIAGNOSIS — Z820 Family history of epilepsy and other diseases of the nervous system: Secondary | ICD-10-CM | POA: Insufficient documentation

## 2018-01-07 DIAGNOSIS — K219 Gastro-esophageal reflux disease without esophagitis: Secondary | ICD-10-CM | POA: Diagnosis not present

## 2018-01-07 DIAGNOSIS — E538 Deficiency of other specified B group vitamins: Secondary | ICD-10-CM | POA: Insufficient documentation

## 2018-01-07 DIAGNOSIS — N135 Crossing vessel and stricture of ureter without hydronephrosis: Secondary | ICD-10-CM | POA: Diagnosis not present

## 2018-01-07 DIAGNOSIS — Z8719 Personal history of other diseases of the digestive system: Secondary | ICD-10-CM | POA: Diagnosis not present

## 2018-01-07 DIAGNOSIS — Z8546 Personal history of malignant neoplasm of prostate: Secondary | ICD-10-CM | POA: Insufficient documentation

## 2018-01-07 DIAGNOSIS — Z87442 Personal history of urinary calculi: Secondary | ICD-10-CM | POA: Diagnosis not present

## 2018-01-07 DIAGNOSIS — Z79899 Other long term (current) drug therapy: Secondary | ICD-10-CM | POA: Diagnosis not present

## 2018-01-07 DIAGNOSIS — Z87891 Personal history of nicotine dependence: Secondary | ICD-10-CM | POA: Insufficient documentation

## 2018-01-07 DIAGNOSIS — Z801 Family history of malignant neoplasm of trachea, bronchus and lung: Secondary | ICD-10-CM | POA: Insufficient documentation

## 2018-01-07 DIAGNOSIS — Z800 Family history of malignant neoplasm of digestive organs: Secondary | ICD-10-CM | POA: Insufficient documentation

## 2018-01-07 DIAGNOSIS — E1122 Type 2 diabetes mellitus with diabetic chronic kidney disease: Secondary | ICD-10-CM | POA: Diagnosis not present

## 2018-01-07 DIAGNOSIS — Z9049 Acquired absence of other specified parts of digestive tract: Secondary | ICD-10-CM | POA: Diagnosis not present

## 2018-01-07 DIAGNOSIS — N201 Calculus of ureter: Secondary | ICD-10-CM | POA: Diagnosis present

## 2018-01-07 DIAGNOSIS — I739 Peripheral vascular disease, unspecified: Secondary | ICD-10-CM | POA: Insufficient documentation

## 2018-01-07 DIAGNOSIS — Z809 Family history of malignant neoplasm, unspecified: Secondary | ICD-10-CM | POA: Insufficient documentation

## 2018-01-07 DIAGNOSIS — Z833 Family history of diabetes mellitus: Secondary | ICD-10-CM | POA: Insufficient documentation

## 2018-01-07 HISTORY — PX: CYSTOSCOPY W/ URETERAL STENT PLACEMENT: SHX1429

## 2018-01-07 LAB — GLUCOSE, CAPILLARY: Glucose-Capillary: 197 mg/dL — ABNORMAL HIGH (ref 65–99)

## 2018-01-07 SURGERY — CYSTOSCOPY, WITH RETROGRADE PYELOGRAM AND URETERAL STENT INSERTION
Anesthesia: General | Site: Ureter | Laterality: Left

## 2018-01-07 MED ORDER — EPHEDRINE SULFATE 50 MG/ML IJ SOLN
INTRAMUSCULAR | Status: DC | PRN
  Administered 2018-01-07: 10 mg via INTRAVENOUS

## 2018-01-07 MED ORDER — MIRABEGRON ER 25 MG PO TB24: 25.0000 mg | ORAL_TABLET | Freq: Every day | ORAL | 0 refills | Status: DC

## 2018-01-07 MED ORDER — DIATRIZOATE MEGLUMINE 30 % UR SOLN
URETHRAL | Status: AC
  Filled 2018-01-07: qty 100

## 2018-01-07 MED ORDER — SODIUM CHLORIDE 0.9 % IR SOLN
Status: DC | PRN
  Administered 2018-01-07: 3000 mL

## 2018-01-07 MED ORDER — SEVOFLURANE IN SOLN
RESPIRATORY_TRACT | Status: AC
Start: 2018-01-07 — End: ?
  Filled 2018-01-07: qty 250

## 2018-01-07 MED ORDER — FENTANYL CITRATE (PF) 100 MCG/2ML IJ SOLN
INTRAMUSCULAR | Status: AC
  Filled 2018-01-07: qty 2

## 2018-01-07 MED ORDER — CEFAZOLIN SODIUM-DEXTROSE 2-4 GM/100ML-% IV SOLN
2.0000 g | INTRAVENOUS | Status: AC
  Administered 2018-01-07: 2 g via INTRAVENOUS

## 2018-01-07 MED ORDER — FENTANYL CITRATE (PF) 100 MCG/2ML IJ SOLN
INTRAMUSCULAR | Status: DC | PRN
  Administered 2018-01-07 (×4): 25 ug via INTRAVENOUS

## 2018-01-07 MED ORDER — MIDAZOLAM HCL 2 MG/2ML IJ SOLN
INTRAMUSCULAR | Status: AC
  Filled 2018-01-07: qty 2

## 2018-01-07 MED ORDER — DIATRIZOATE MEGLUMINE 30 % UR SOLN
URETHRAL | Status: DC | PRN
  Administered 2018-01-07: 300 mL via URETHRAL

## 2018-01-07 MED ORDER — LIDOCAINE HCL 1 % IJ SOLN
INTRAMUSCULAR | Status: DC | PRN
  Administered 2018-01-07: 40 mg via INTRADERMAL

## 2018-01-07 MED ORDER — PROPOFOL 10 MG/ML IV BOLUS
INTRAVENOUS | Status: DC | PRN
  Administered 2018-01-07: 120 mg via INTRAVENOUS
  Administered 2018-01-07: 30 mg via INTRAVENOUS

## 2018-01-07 MED ORDER — STERILE WATER FOR IRRIGATION IR SOLN
Status: DC | PRN
  Administered 2018-01-07: 1000 mL

## 2018-01-07 MED ORDER — SODIUM CHLORIDE 0.9 % IJ SOLN
INTRAMUSCULAR | Status: AC
  Filled 2018-01-07: qty 10

## 2018-01-07 MED ORDER — LACTATED RINGERS IV SOLN
INTRAVENOUS | Status: DC
  Administered 2018-01-07: 11:00:00 via INTRAVENOUS

## 2018-01-07 MED ORDER — HYDROCODONE-ACETAMINOPHEN 5-325 MG PO TABS: 1.0000 | ORAL_TABLET | Freq: Four times a day (QID) | ORAL | 0 refills | Status: DC | PRN

## 2018-01-07 MED ORDER — CEFAZOLIN SODIUM-DEXTROSE 2-4 GM/100ML-% IV SOLN
INTRAVENOUS | Status: AC
  Filled 2018-01-07: qty 100

## 2018-01-07 MED ORDER — EPHEDRINE SULFATE 50 MG/ML IJ SOLN
INTRAMUSCULAR | Status: AC
  Filled 2018-01-07: qty 1

## 2018-01-07 MED ORDER — FENTANYL CITRATE (PF) 100 MCG/2ML IJ SOLN: 25.0000 ug | INTRAMUSCULAR | Status: DC | PRN

## 2018-01-07 MED ORDER — MIDAZOLAM HCL 2 MG/2ML IJ SOLN
1.0000 mg | INTRAMUSCULAR | Status: AC
  Administered 2018-01-07 (×2): 1 mg via INTRAVENOUS

## 2018-01-07 SURGICAL SUPPLY — 19 items
BAG DRAIN URO TABLE W/ADPT NS (DRAPE) ×3 IMPLANT
BAG HAMPER (MISCELLANEOUS) ×3 IMPLANT
CATH INTERMIT  6FR 70CM (CATHETERS) ×3 IMPLANT
CLOTH BEACON ORANGE TIMEOUT ST (SAFETY) ×3 IMPLANT
DECANTER SPIKE VIAL GLASS SM (MISCELLANEOUS) ×3 IMPLANT
GLOVE BIO SURGEON STRL SZ8 (GLOVE) ×3 IMPLANT
GOWN STRL REUS W/ TWL XL LVL3 (GOWN DISPOSABLE) ×1 IMPLANT
GOWN STRL REUS W/TWL LRG LVL3 (GOWN DISPOSABLE) ×3 IMPLANT
GOWN STRL REUS W/TWL XL LVL3 (GOWN DISPOSABLE) ×2
GUIDEWIRE STR DUAL SENSOR (WIRE) ×3 IMPLANT
GUIDEWIRE STR ZIPWIRE 035X150 (MISCELLANEOUS) ×3 IMPLANT
IV NS IRRIG 3000ML ARTHROMATIC (IV SOLUTION) ×3 IMPLANT
KIT ROOM TURNOVER AP CYSTO (KITS) ×3 IMPLANT
MANIFOLD NEPTUNE II (INSTRUMENTS) ×3 IMPLANT
PACK CYSTO (CUSTOM PROCEDURE TRAY) ×3 IMPLANT
PAD ARMBOARD 7.5X6 YLW CONV (MISCELLANEOUS) ×3 IMPLANT
STENT CONTOUR 7FRX26 (STENTS) ×3 IMPLANT
SYRINGE 10CC LL (SYRINGE) ×3 IMPLANT
TOWEL OR 17X26 4PK STRL BLUE (TOWEL DISPOSABLE) ×3 IMPLANT

## 2018-01-07 NOTE — Discharge Instructions (Signed)
Ureteral Stent Implantation, Care After °Refer to this sheet in the next few weeks. These instructions provide you with information about caring for yourself after your procedure. Your health care provider may also give you more specific instructions. Your treatment has been planned according to current medical practices, but problems sometimes occur. Call your health care provider if you have any problems or questions after your procedure. °What can I expect after the procedure? °After the procedure, it is common to have: °· Nausea. °· Mild pain when you urinate. You may feel this pain in your lower back or lower abdomen. Pain should stop within a few minutes after you urinate. This may last for up to 1 week. °· A small amount of blood in your urine for several days. ° °Follow these instructions at home: ° °Medicines °· Take over-the-counter and prescription medicines only as told by your health care provider. °· If you were prescribed an antibiotic medicine, take it as told by your health care provider. Do not stop taking the antibiotic even if you start to feel better. °· Do not drive for 24 hours if you received a sedative. °· Do not drive or operate heavy machinery while taking prescription pain medicines. °Activity °· Return to your normal activities as told by your health care provider. Ask your health care provider what activities are safe for you. °· Do not lift anything that is heavier than 10 lb (4.5 kg). Follow this limit for 1 week after your procedure, or for as long as told by your health care provider. °General instructions °· Watch for any blood in your urine. Call your health care provider if the amount of blood in your urine increases. °· If you have a catheter: °? Follow instructions from your health care provider about taking care of your catheter and collection bag. °? Do not take baths, swim, or use a hot tub until your health care provider approves. °· Drink enough fluid to keep your urine  clear or pale yellow. °· Keep all follow-up visits as told by your health care provider. This is important. °Contact a health care provider if: °· You have pain that gets worse or does not get better with medicine, especially pain when you urinate. °· You have difficulty urinating. °· You feel nauseous or you vomit repeatedly during a period of more than 2 days after the procedure. °Get help right away if: °· Your urine is dark red or has blood clots in it. °· You are leaking urine (have incontinence). °· The end of the stent comes out of your urethra. °· You cannot urinate. °· You have sudden, sharp, or severe pain in your abdomen or lower back. °· You have a fever. °This information is not intended to replace advice given to you by your health care provider. Make sure you discuss any questions you have with your health care provider. °Document Released: 08/18/2013 Document Revised: 05/23/2016 Document Reviewed: 06/30/2015 °Elsevier Interactive Patient Education © 2018 Else[employer] Inc[employer] °PATIENT INSTRUCTIONS °POST-ANESTHESIA ° °IMMEDIATELY FOLLOWING SURGERY:  Do not drive or operate machinery for the first twenty four hours after surgery.  Do not make any important decisions for twenty four hours after surgery or while taking narcotic pain medications or sedatives.  If you develop intractable nausea and vomiting or a severe headache please notify your doctor immediately. ° °FOLLOW-UP:  Please make an appointment with your surgeon as instructed. You do not need to follow up with anesthesia unless specifically instructed to do so. ° °  WOUND CARE INSTRUCTIONS (if applicable):  Keep a dry clean dressing on the anesthesia/puncture wound site if there is drainage.  Once the wound has quit draining you may leave it open to air.  Generally you should leave the bandage intact for twenty four hours unless there is drainage.  If the epidural site drains for more than 36-48 hours please call the anesthesia  department. ° °QUESTIONS?:  Please feel free to call your physician or the hospital operator if you have any questions, and they will be happy to assist you.   ° ° °  ° ° ° °

## 2018-01-07 NOTE — Anesthesia Preprocedure Evaluation (Signed)
Anesthesia Evaluation  Patient identified by MRN, date of birth, ID band Patient awake    Reviewed: Allergy & Precautions, NPO status , Patient's Chart, lab work & pertinent test results  History of Anesthesia Complications (+) PONV and history of anesthetic complications  Airway Mallampati: I  TM Distance: >3 FB     Dental  (+) Edentulous Upper, Partial Lower   Pulmonary sleep apnea , former smoker,    breath sounds clear to auscultation       Cardiovascular hypertension, Pt. on medications + CAD, + CABG and + Peripheral Vascular Disease   Rhythm:Regular Rate:Normal     Neuro/Psych PSYCHIATRIC DISORDERS Depression  Neuromuscular disease    GI/Hepatic hiatal hernia, GERD  Controlled and Medicated,  Endo/Other  diabetes, Type 2  Renal/GU Renal disease     Musculoskeletal  (+) Arthritis ,   Abdominal   Peds  Hematology  (+) anemia ,   Anesthesia Other Findings   Reproductive/Obstetrics                             Anesthesia Physical Anesthesia Plan  ASA: III  Anesthesia Plan: General   Post-op Pain Management:    Induction: Intravenous  PONV Risk Score and Plan:   Airway Management Planned: LMA  Additional Equipment:   Intra-op Plan:   Post-operative Plan: Extubation in OR  Informed Consent: I have reviewed the patients History and Physical, chart, labs and discussed the procedure including the risks, benefits and alternatives for the proposed anesthesia with the patient or authorized representative who has indicated his/her understanding and acceptance.     Plan Discussed with:   Anesthesia Plan Comments:         Anesthesia Quick Evaluation

## 2018-01-07 NOTE — Op Note (Signed)
Preoperative diagnosis: left ureteral stricture  Postoperative diagnosis: same  Procedure: 1 cystoscopy 2. Left retrograde pyelography 3. Intraoperative fluoroscopy, under one hour, with interpretation 4. Left 7x 26 JJ stent exchange  Attending: Rosie Fate  Anesthesia: General  Estimated blood loss: None  Drains: Left 7x 70 JJ ureteral stent without tether  Specimens: stone for analysis  Antibiotics: ancef  Findings: mild lefthydronephrosis. Stent not encrusted.No masses/lesions in the bladder. Ureteral orifices in normal anatomic location.  Indications: Patient is a 84 year old male with a history a left mid ureteral stricture. After discussing treatment options, she decided proceed with left stent exchange.  Procedure in detail: The patient was brought to the operating room and a brief timeout was done to ensure correct patient, correct procedure, correct site. General anesthesia was administered patient was placed in dorsal lithotomy position. Her genitalia was then prepped and draped in usual sterile fashion. A rigid 15 French cystoscope was passed in the urethra and the bladder. Bladder was inspected free masses or lesions. the ureteral orifices were in the normal orthotopic locations. a 6 french ureteral catheter was then instilled into the left ureteral orifice. a gentle retrograde was obtained and findings noted above. We then used a grasper to bring the ureteral stent to the urethral meatus. we then placed a sensor wire through the ureteral stent and advanced up to the renal pelvis. We then removed the stent we then placed a 7x 26 double-j ureteral stent over the original sensor wire. We then removed the wire and good coil was noted in the the renal pelvis under fluoroscopy and the bladder under direct vision. the bladder was then drained and this concluded the procedure which was well tolerated by patient.  Complications: None  Condition:  Stable, extubated, transferred to PACU  Plan: Patient is to be discharged home as to follow-up in 4 weeks with a renal US.

## 2018-01-07 NOTE — Anesthesia Postprocedure Evaluation (Signed)
Anesthesia Post Note  Patient: Andrew Zhang  Procedure(s) Performed: CYSTOSCOPY WITH RETROGRADE PYELOGRAM/URETERAL STENT PLACEMENT (Left Ureter)  Patient location during evaluation: PACU Anesthesia Type: General Level of consciousness: awake and patient cooperative Pain management: pain level controlled Vital Signs Assessment: post-procedure vital signs reviewed and stable Respiratory status: spontaneous breathing, nonlabored ventilation and respiratory function stable Cardiovascular status: blood pressure returned to baseline Postop Assessment: no apparent nausea or vomiting Anesthetic complications: no     Last Vitals:  Vitals:   01/07/18 1215 01/07/18 1337  BP:  (!) 172/86  Resp:  12  Temp:  36.4 C  SpO2: 95% 97%    Last Pain:  Vitals:   01/07/18 1337  TempSrc:   PainSc: 4                  Ezmae Speers J

## 2018-01-07 NOTE — Anesthesia Procedure Notes (Signed)
Procedure Name: LMA Insertion Date/Time: 01/07/2018 12:34 PM Performed by: Andree Elk, Cheyene Hamric A, CRNA Pre-anesthesia Checklist: Patient identified, Timeout performed, Emergency Drugs available, Suction available and Patient being monitored Patient Re-evaluated:Patient Re-evaluated prior to induction Oxygen Delivery Method: Circle system utilized Preoxygenation: Pre-oxygenation with 100% oxygen Induction Type: IV induction Ventilation: Mask ventilation without difficulty LMA: LMA inserted LMA Size: 5.0 Number of attempts: 1 Placement Confirmation: positive ETCO2 and breath sounds checked- equal and bilateral Tube secured with: Tape Dental Injury: Teeth and Oropharynx as per pre-operative assessment

## 2018-01-07 NOTE — H&P (Signed)
Urology Admission H&P  Chief Complaint: left ureteral stricture  History of Present Illness: Andrew Zhang is a 84yo with a hx of left ureteral stricture managed with a left indwelling ureteral stent. No hematuria or dysuria. No fevers/achills/sweats. No other asscoiated LUTS  Past Medical History:  Diagnosis Date  . AAA (abdominal aortic aneurysm) ([place])   . Anemia   . Arthritis   . B12 deficiency   . Blood transfusion without reported diagnosis   . CAD (coronary artery disease)   . Cancer [facility])    radiation for prostate cancer and lupron.  . Cataract   . Complication of anesthesia    sts,"after hearat surgery my stomach didnt wake up like it was supossed to".  . Depression   . Diverticulitis   . DMII (diabetes mellitus, type 2) ([place]) 2008  . GERD (gastroesophageal reflux disease)   . GI bleed   . GIB (gastrointestinal bleeding) 09/18/2016  . Hiatal hernia   . History of kidney stones   . HTN (hypertension)   . Hyperlipidemia   . Iron deficiency anemia due to chronic blood loss 01/08/2017  . Kidney stone   . PONV (postoperative nausea and vomiting)   . Prostate cancer ([place])   . Sleep apnea    cannot tolerate, PCP aware  . Ureteral stricture, left    Past Surgical History:  Procedure Laterality Date  . APPENDECTOMY    . BACK SURGERY     x2  . CATARACT EXTRACTION Left   . CHOLECYSTECTOMY    . COLONOSCOPY  03/2015   Dr. Britta Mccreedy: Diverticulosis, single tubular adenoma removed.  . COLONOSCOPY WITH PROPOFOL N/A 02/25/2017   Dr. Oneida Alar: non-thrombosed external hemorrhoids, significant looping of left colon. severe diverticulosis in recto-sigmoid colon and sigmoid colon. 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 CORONARY ARTERY BYPASS GRAFT     1995  . CYSTOSCOPY     with stent-left kidney. x2  . CYSTOSCOPY W/ URETERAL STENT PLACEMENT Left 05/14/2017   Procedure: CYSTOSCOPY WITH  RETROGRADE PYELOGRAM/URETERAL STENT EXCHANGE;  Surgeon: Cleon Gustin, MD;  Location: [facility];  Service: Urology;  Laterality: Left;  . CYSTOSCOPY W/ URETERAL STENT PLACEMENT Left 10/13/2017   Procedure: CYSTOSCOPY WITH LEFT RETROGRADE PYELOGRAM/LEFT URETERAL STENT REPLACEMENT;  Surgeon: Cleon Gustin, MD;  Location: [facility];  Service: Urology;  Laterality: Left;  . CYSTOSCOPY WITH FULGERATION  08/13/2017   Procedure: CYSTOSCOPY WITH FULGERATION OF BLADDER;  Surgeon: Cleon Gustin, MD;  Location: [facility];  Service: Urology;;  . Consuela Mimes WITH RETROGRADE PYELOGRAM, URETEROSCOPY AND STENT PLACEMENT Left 08/13/2017   Procedure: CYSTOSCOPY WITH LEFT RETROGRADE PYELOGRAM, LEFT URETEROSCOPY AND STENT REPLACEMENT;  Surgeon: Cleon Gustin, MD;  Location: [facility];  Service: Urology;  Laterality: Left;  . DESCENDING AORTIC ANEURYSM REPAIR W/ STENT     x2  . ESOPHAGOGASTRODUODENOSCOPY N/A 07/30/2016   Dr. Oneida Alar. Nonobstructing Schatzki ring at GE junction, multiple small sessile polyps with no bleeding or stigmata of recent bleeding in the gastric fundus and gastric body. Benign-appearing intrinsic moderate stenosis found the pylorus status post dilation. Small bowel capsule deployed.  Marland Kitchen GIVENS CAPSULE STUDY     Capsule study is complete to the cecum. No obvious lesions, mass, tumors. Small wisps of blood noted in small bowel secondary to EGD/dilation. Possible few erosions in setting of NSAIDs. No obvious areas of bleeding.  Marland Kitchen GIVENS CAPSULE STUDY N/A 11/29/2017   Dr. Laural Golden: no evidence of active bleeding  in stomach or small bowel. Multiple gastric petechia, single small AVM in proximal small bowel, scattered petechia involving small bowel mucosa.   Marland Kitchen HERNIA REPAIR    . HOLMIUM LASER APPLICATION Left 1/95/0932   Procedure: HOLMIUM LASER LITHOTRIPSY OF ENCRUSTED LEFT URETERAL STENT;  Surgeon: Cleon Gustin, MD;  Location: [facility];  Service: Urology;  Laterality: Left;  . SPINE SURGERY      ruptured discs    Home Medications:  Current Facility-Administered Medications  Medication Dose Route Frequency Provider Last Rate Last Dose  . ceFAZolin (ANCEF) IVPB 2g/100 mL premix  2 g Intravenous 30 min Pre-Op Andrew Zhang, Candee Furbish, MD      . lactated ringers infusion   Intravenous Continuous Lerry Liner, MD 75 mL/hr at 01/07/18 1040     Allergies:  Allergies  Allergen Reactions  . Penicillins Rash and Other (See Comments)    Happened in [phone] Has patient had a PCN reaction causing immediate rash, facial/tongue/throat swelling, SOB or lightheaddness with hypotension: Yes - pt states he "just broke out a little but" after taking Penicillin Has patient had a PCN reaction causing severe rash involving mucus membranes or skin necrosis: Unknown Has patient had a PCN reaction that required hospitalization: No Has patient had a PCN reaction occurring within the last 10 years: No     Family History  Problem Relation Age of Onset  . Heart disease Unknown   . Diabetes Unknown   . Hypertension Unknown   . Liver cancer Mother   . Arthritis Mother   . Cancer Mother        died at 16  . Heart disease Mother   . Alzheimer's disease Father   . Cancer Brother        lung  . Cancer Son 38       lung  . Hepatitis Brother   . Alcohol abuse Brother   . Heart disease Son 53       heart attack, stents  . Diabetes Son   . Colon cancer Neg Hx    Social History:  reports that he quit smoking about 23 years ago. His smoking use included cigarettes. He started smoking about 66 years ago. He has a 135.00 pack-year smoking history. he has never used smokeless tobacco. He reports that he drinks alcohol. He reports that he does not use drugs.  Review of Systems  All other systems reviewed and are negative.   Physical Exam:  Vital signs in last 24 hours: Temp:  [98.2 F (36.8 C)] 98.2 F (36.8 C) (01/09 1023) Resp:  [13-22] 16 (01/09 1100) BP: (142-145)/(69-76) 145/76 (01/09  1100) SpO2:  [91 %-95 %] 95 % (01/09 1100) Weight:  [85.7 kg (189 lb)] 85.7 kg (189 lb) (01/09 1006) Physical Exam  Constitutional: He is oriented to person, place, and time. He appears well-developed and well-nourished.  HENT:  Head: Normocephalic and atraumatic.  Eyes: EOM are normal. Pupils are equal, round, and reactive to light.  Neck: Normal range of motion. No thyromegaly present.  Cardiovascular: Normal rate and regular rhythm.  Respiratory: Effort normal. No respiratory distress.  GI: Soft. He exhibits no distension.  Musculoskeletal: Normal range of motion. He exhibits no edema.  Neurological: He is alert and oriented to person, place, and time.  Skin: Skin is warm and dry.  Psychiatric: He has a normal mood and affect. His behavior is normal. Judgment and thought content normal.    Laboratory Data:  Results for orders placed or performed during  the hospital encounter of 01/07/18 (from the past 24 hour(s))  Glucose, capillary     Status: Abnormal   Collection Time: 01/07/18 10:24 AM  Result Value Ref Range   Glucose-Capillary 197 (H) 65 - 99 mg/dL   No results found for this or any previous visit (from the past 240 hour(s)). Creatinine: Recent Labs    01/02/18 1320  CREATININE 2.38*   Baseline Creatinine: 2  Impression/Assessment:  83yo with left ureteral stricture managed with a ureteral stent  Plan:  The risks/benefits/alternatives to left ureteral stent exchange was explained to the patient and he understands and wishes to proceed with surgery  Nicolette Bang 01/07/2018, 12:04 PM

## 2018-01-07 NOTE — Transfer of Care (Signed)
Immediate Anesthesia Transfer of Care Note  Patient: Andrew Zhang  Procedure(s) Performed: CYSTOSCOPY WITH RETROGRADE PYELOGRAM/URETERAL STENT PLACEMENT (Left Ureter)  Patient Location: PACU  Anesthesia Type:General  Level of Consciousness: awake and patient cooperative  Airway & Oxygen Therapy: Patient Spontanous Breathing and Patient connected to nasal cannula oxygen  Post-op Assessment: Report given to RN, Post -op Vital signs reviewed and stable and Patient moving all extremities  Post vital signs: Reviewed and stable  Last Vitals:  Vitals:   01/07/18 1210 01/07/18 1215  BP:    Resp: 16   Temp:    SpO2: 97% 95%    Last Pain:  Vitals:   01/07/18 1023  TempSrc: Oral         Complications: No apparent anesthesia complications

## 2018-01-08 ENCOUNTER — Encounter (HOSPITAL_COMMUNITY): Payer: Self-pay | Admitting: Urology

## 2018-01-08 DIAGNOSIS — I714 Abdominal aortic aneurysm, without rupture: Secondary | ICD-10-CM | POA: Diagnosis not present

## 2018-01-08 DIAGNOSIS — E1122 Type 2 diabetes mellitus with diabetic chronic kidney disease: Secondary | ICD-10-CM | POA: Diagnosis not present

## 2018-01-08 DIAGNOSIS — E538 Deficiency of other specified B group vitamins: Secondary | ICD-10-CM | POA: Diagnosis not present

## 2018-01-08 DIAGNOSIS — Z7982 Long term (current) use of aspirin: Secondary | ICD-10-CM | POA: Diagnosis not present

## 2018-01-08 DIAGNOSIS — D500 Iron deficiency anemia secondary to blood loss (chronic): Secondary | ICD-10-CM | POA: Diagnosis not present

## 2018-01-08 DIAGNOSIS — E785 Hyperlipidemia, unspecified: Secondary | ICD-10-CM | POA: Diagnosis not present

## 2018-01-08 DIAGNOSIS — K4020 Bilateral inguinal hernia, without obstruction or gangrene, not specified as recurrent: Secondary | ICD-10-CM | POA: Diagnosis not present

## 2018-01-08 DIAGNOSIS — Z923 Personal history of irradiation: Secondary | ICD-10-CM | POA: Diagnosis not present

## 2018-01-08 DIAGNOSIS — D649 Anemia, unspecified: Secondary | ICD-10-CM | POA: Diagnosis not present

## 2018-01-08 DIAGNOSIS — N184 Chronic kidney disease, stage 4 (severe): Secondary | ICD-10-CM | POA: Diagnosis not present

## 2018-01-08 DIAGNOSIS — K922 Gastrointestinal hemorrhage, unspecified: Secondary | ICD-10-CM | POA: Diagnosis not present

## 2018-01-08 DIAGNOSIS — Z7984 Long term (current) use of oral hypoglycemic drugs: Secondary | ICD-10-CM | POA: Diagnosis not present

## 2018-01-08 DIAGNOSIS — Z880 Allergy status to penicillin: Secondary | ICD-10-CM | POA: Diagnosis not present

## 2018-01-08 DIAGNOSIS — Z955 Presence of coronary angioplasty implant and graft: Secondary | ICD-10-CM | POA: Diagnosis not present

## 2018-01-08 DIAGNOSIS — Z87891 Personal history of nicotine dependence: Secondary | ICD-10-CM | POA: Diagnosis not present

## 2018-01-08 DIAGNOSIS — Z951 Presence of aortocoronary bypass graft: Secondary | ICD-10-CM | POA: Diagnosis not present

## 2018-01-08 DIAGNOSIS — I25810 Atherosclerosis of coronary artery bypass graft(s) without angina pectoris: Secondary | ICD-10-CM | POA: Diagnosis not present

## 2018-01-08 DIAGNOSIS — C61 Malignant neoplasm of prostate: Secondary | ICD-10-CM | POA: Diagnosis not present

## 2018-01-08 DIAGNOSIS — Z6824 Body mass index (BMI) 24.0-24.9, adult: Secondary | ICD-10-CM | POA: Diagnosis not present

## 2018-01-08 DIAGNOSIS — Z7983 Long term (current) use of bisphosphonates: Secondary | ICD-10-CM | POA: Diagnosis not present

## 2018-01-08 DIAGNOSIS — I129 Hypertensive chronic kidney disease with stage 1 through stage 4 chronic kidney disease, or unspecified chronic kidney disease: Secondary | ICD-10-CM | POA: Diagnosis not present

## 2018-01-08 DIAGNOSIS — N200 Calculus of kidney: Secondary | ICD-10-CM | POA: Diagnosis not present

## 2018-01-08 NOTE — Addendum Note (Signed)
Addendum  created 01/08/18 1009 by Vista Deck, CRNA   Intraprocedure Staff edited

## 2018-01-12 ENCOUNTER — Telehealth: Payer: Self-pay | Admitting: Cardiovascular Disease

## 2018-01-12 NOTE — Telephone Encounter (Signed)
Opened in error

## 2018-01-16 DIAGNOSIS — L239 Allergic contact dermatitis, unspecified cause: Secondary | ICD-10-CM | POA: Diagnosis not present

## 2018-01-19 ENCOUNTER — Other Ambulatory Visit: Payer: Self-pay

## 2018-01-19 DIAGNOSIS — D509 Iron deficiency anemia, unspecified: Secondary | ICD-10-CM

## 2018-01-22 DIAGNOSIS — D649 Anemia, unspecified: Secondary | ICD-10-CM | POA: Diagnosis not present

## 2018-01-22 DIAGNOSIS — D500 Iron deficiency anemia secondary to blood loss (chronic): Secondary | ICD-10-CM | POA: Diagnosis not present

## 2018-01-28 ENCOUNTER — Telehealth: Payer: Self-pay | Admitting: *Deleted

## 2018-01-28 NOTE — Telephone Encounter (Signed)
Good, as long as he is followed closely.

## 2018-01-28 NOTE — Telephone Encounter (Signed)
Spouse called stating she received lab work in the mail for pt but he has a new hematologists Dr. Lonia Chimera at [facility]. They have him scheduled to do monthly blood work for the next year. Sending as an Pharmacist, hospital to [place].

## 2018-02-11 ENCOUNTER — Other Ambulatory Visit: Payer: Self-pay | Admitting: Urology

## 2018-02-11 ENCOUNTER — Ambulatory Visit: Payer: Medicare Other | Admitting: Urology

## 2018-02-11 DIAGNOSIS — N130 Hydronephrosis with ureteropelvic junction obstruction: Secondary | ICD-10-CM

## 2018-02-11 DIAGNOSIS — C61 Malignant neoplasm of prostate: Secondary | ICD-10-CM | POA: Diagnosis not present

## 2018-02-17 ENCOUNTER — Ambulatory Visit (HOSPITAL_COMMUNITY)
Admission: RE | Admit: 2018-02-17 | Discharge: 2018-02-17 | Disposition: A | Discharge status: Home / Self Care | Payer: Medicare Other | Source: Ambulatory Visit | Attending: Urology | Admitting: Urology

## 2018-02-17 DIAGNOSIS — N130 Hydronephrosis with ureteropelvic junction obstruction: Secondary | ICD-10-CM | POA: Diagnosis not present

## 2018-02-17 DIAGNOSIS — N132 Hydronephrosis with renal and ureteral calculous obstruction: Secondary | ICD-10-CM | POA: Diagnosis not present

## 2018-02-20 ENCOUNTER — Ambulatory Visit (INDEPENDENT_AMBULATORY_CARE_PROVIDER_SITE_OTHER): Payer: Medicare Other | Admitting: Urology

## 2018-02-20 DIAGNOSIS — R310 Gross hematuria: Secondary | ICD-10-CM

## 2018-02-20 DIAGNOSIS — C61 Malignant neoplasm of prostate: Secondary | ICD-10-CM

## 2018-02-20 DIAGNOSIS — N131 Hydronephrosis with ureteral stricture, not elsewhere classified: Secondary | ICD-10-CM | POA: Diagnosis not present

## 2018-02-24 ENCOUNTER — Other Ambulatory Visit: Payer: Self-pay | Admitting: Urology

## 2018-02-24 ENCOUNTER — Encounter: Payer: Self-pay | Admitting: Family Medicine

## 2018-02-24 ENCOUNTER — Ambulatory Visit (INDEPENDENT_AMBULATORY_CARE_PROVIDER_SITE_OTHER): Payer: Medicare Other | Admitting: Family Medicine

## 2018-02-24 ENCOUNTER — Other Ambulatory Visit: Payer: Self-pay

## 2018-02-24 VITALS — BP 134/74 | HR 84 | Temp 97.5°F | Resp 18 | Ht 74.0 in | Wt 190.1 lb

## 2018-02-24 DIAGNOSIS — Z0000 Encounter for general adult medical examination without abnormal findings: Secondary | ICD-10-CM

## 2018-02-24 DIAGNOSIS — E785 Hyperlipidemia, unspecified: Secondary | ICD-10-CM | POA: Diagnosis not present

## 2018-02-24 DIAGNOSIS — E1140 Type 2 diabetes mellitus with diabetic neuropathy, unspecified: Secondary | ICD-10-CM | POA: Diagnosis not present

## 2018-02-24 DIAGNOSIS — I10 Essential (primary) hypertension: Secondary | ICD-10-CM | POA: Diagnosis not present

## 2018-02-24 MED ORDER — GABAPENTIN 300 MG PO CAPS: 300.0000 mg | ORAL_CAPSULE | Freq: Every day | ORAL | 3 refills | Status: DC

## 2018-02-24 NOTE — Patient Instructions (Signed)
Need blood work and urine testing today I am adding a new medicine.  Take gabapentin once a day at bedtime.  This is to help with diabetic nerve pain.  He might need to take it an hour before you go to sleep for it to have full effect.  Continue to eat a heart healthy diet.  Walk every day that you are able.  Still do not have your full immunization records.  If you know where your pneumonia shots were done, we need to request that they be sent to [place].  You need follow-up in 6 months.  At that visit you will see my partner Caren Macadam, MD

## 2018-02-24 NOTE — Progress Notes (Signed)
Chief Complaint  Patient presents with  . Annual Exam   Patient is here for an annual examination. He is a well-controlled diabetic. He states he has had an eye exam within the last year.  I requested to get the record to me. He states that he gets yearly flu shots.  He has had 2 pneumonia shots.  He has not had shingles shot.  After discussion he does not want one.  He believes his tetanus shot was within 10 years.  He does not have this record. He is feeling reasonably well today.  His weight is stable.  No falls. He complains of burning and pain in his feet.  Keeps him awake at night.  We discussed diabetic neuropathy.  We discussed treatment options.  He agrees to try gabapentin 300 mg at bedtime.  He is cautioned that this will cause sedation.  He is advised that the sedation will wear off if he continues on the medication over time.   Patient Active Problem List   Diagnosis Date Noted  . Acute blood loss anemia   . Iron deficiency anemia due to chronic blood loss 01/08/2017  . Melena   . Acute on chronic renal failure ([place]) 07/29/2016  . Symptomatic anemia 07/29/2016  . Adenocarcinoma of prostate ([place]) 09/06/2014  . Type 2 diabetes mellitus with diabetic neuropathy, unspecified ([place]) 09/06/2014  . CAD (coronary artery disease) of artery bypass graft 08/27/2013  . HTN (hypertension) 08/27/2013  . Hyperlipidemia 08/27/2013  . S/P CABG x 3 08/27/2013  . Abdominal aortic aneurysm ([place]) 04/22/2013    Outpatient Encounter Medications as of 02/24/2018  Medication Sig  . acetaminophen (TYLENOL) 325 MG tablet Take 325 mg by mouth every 6 (six) hours as needed for moderate pain or headache.  Marland Kitchen aspirin EC 81 MG tablet Take 1 tablet (81 mg total) by mouth daily.  . B Complex Vitamins (B COMPLEX-B12 PO) Take 1 drop by mouth daily.   . cholecalciferol (VITAMIN D) 1000 units tablet Take 1,000 Units by mouth 2 (two) times daily.  . finasteride (PROSCAR) 5 MG tablet Take 5 mg by mouth  daily.  Marland Kitchen glimepiride (AMARYL) 2 MG tablet Take 1 tablet (2 mg total) by mouth daily with breakfast. (Patient taking differently: Take 2 mg by mouth 2 (two) times daily. )  . HYDROcodone-acetaminophen (NORCO) 5-325 MG tablet Take 1 tablet by mouth every 6 (six) hours as needed for moderate pain.  . mirabegron ER (MYRBETRIQ) 25 MG TB24 tablet Take 1 tablet (25 mg total) by mouth daily.  . Naphazoline-Pheniramine (OPCON-A) 0.027-0.315 % SOLN Place 1 drop into both eyes daily as needed (for dry eyes).  . nitroGLYCERIN (NITROSTAT) 0.4 MG SL tablet Place 1 tablet (0.4 mg total) under the tongue every 5 (five) minutes as needed for chest pain.  Marland Kitchen omeprazole (PRILOSEC) 20 MG capsule Take 1 capsule (20 mg total) by mouth 2 (two) times daily before a meal. (Patient taking differently: Take 20 mg by mouth daily. )  . sertraline (ZOLOFT) 50 MG tablet Take 50 mg by mouth daily.  . simvastatin (ZOCOR) 80 MG tablet Take 80 mg by mouth at bedtime.  . tamsulosin (FLOMAX) 0.4 MG CAPS Take 0.4 mg by mouth daily after supper.  . gabapentin (NEURONTIN) 300 MG capsule Take 1 capsule (300 mg total) by mouth at bedtime.   No facility-administered encounter medications on file as of 02/24/2018.     Allergies  Allergen Reactions  . Penicillins Rash and Other (See Comments)  Happened in [phone] Has patient had a PCN reaction causing immediate rash, facial/tongue/throat swelling, SOB or lightheaddness with hypotension: Yes - pt states he "just broke out a little but" after taking Penicillin Has patient had a PCN reaction causing severe rash involving mucus membranes or skin necrosis: Unknown Has patient had a PCN reaction that required hospitalization: No Has patient had a PCN reaction occurring within the last 10 years: No     Review of Systems  Constitutional: Negative for activity change, appetite change, fatigue and unexpected weight change.  HENT: Negative for congestion, dental problem and hearing loss.     Eyes: Negative for photophobia and visual disturbance.  Respiratory: Negative for cough and shortness of breath.   Cardiovascular: Negative for chest pain, palpitations and leg swelling.  Gastrointestinal: Negative for abdominal pain, constipation and diarrhea.  Genitourinary: Negative for difficulty urinating, frequency and hematuria.  Musculoskeletal: Positive for arthralgias.       Occasional  Skin: Negative for color change and pallor.  Neurological: Positive for numbness. Negative for dizziness and headaches.       Numbness and burning pain of feet  Psychiatric/Behavioral: Negative for dysphoric mood. The patient is not nervous/anxious.     Physical Exam   BP 134/74 (BP Location: Left Arm, Patient Position: Sitting, Cuff Size: Normal)   Pulse 84   Temp (!) 97.5 F (36.4 C) (Temporal)   Resp 18   Ht 6\' 2"  (1.88 m)   Wt 190 lb 1.9 oz (86.2 kg)   SpO2 94%   BMI 24.41 kg/m   General Appearance:    Alert, cooperative, no distress, appears stated age.   Head:    Normocephalic, without obvious abnormality, atraumatic  Eyes:    PERRL, conjunctiva/corneas clear, EOM's intact, fundi    benign,, cataract right       Ears:    Normal TM's and external ear canals, both ears.  Narrow ear canals  Nose:   Nares normal, septum midline, mucosa normal, no drainage   or sinus tenderness  Throat:   Lips, mucosa, and tongue normal; teeth and gums normal  Neck:   Supple, symmetrical, trachea midline, no adenopathy;       thyroid:  No enlargement/tenderness/nodules; no carotid   bruit or JVD  Back:     Symmetric, no curvature, ROM normal, no CVA tenderness  Lungs:     Clear to auscultation bilaterally, respirations unlabored  Chest wall:    No tenderness or deformity  Heart:    Regular rate and rhythm, S1 and S2 normal, no murmur, rub   or gallop  Abdomen:     Soft, non-tender, bowel sounds active all four quadrants,    no masses, no organomegaly  Genitalia:    Normal male without lesion,  discharge or tenderness.  Bilateral hernia scars  Extremities:   Extremities normal, atraumatic, no cyanosis or edema  Pulses:   1+ and symmetric lower extremities  Skin:   Skin color, texture, turgor normal, no rashes or lesions  Lymph nodes:   Cervical, supraclavicular, and axillary nodes normal  Neurologic:   Normal strength, sensation and reflexes      throughout     ASSESSMENT/PLAN:  Annual examination  1. Type 2 diabetes mellitus with diabetic neuropathy, without long-term current use of insulin (HCC) Controlled diabetes.  Neuropathy uncontrolled.  Will add gabapentin - Urinalysis, Routine w reflex microscopic - Microalbumin / creatinine urine ratio  2. Essential hypertension Controlled  3. Hyperlipidemia, unspecified hyperlipidemia type Controlled.  Due for  lipid panel - Lipid panel   Patient Instructions  Need blood work and urine testing today I am adding a new medicine.  Take gabapentin once a day at bedtime.  This is to help with diabetic nerve pain.  He might need to take it an hour before you go to sleep for it to have full effect.  Continue to eat a heart healthy diet.  Walk every day that you are able.  Still do not have your full immunization records.  If you know where your pneumonia shots were done, we need to request that they be sent to [place].  You need follow-up in 6 months.  At that visit you will see my partner Caren Macadam, MD   [name], MD

## 2018-02-25 LAB — URINALYSIS, ROUTINE W REFLEX MICROSCOPIC
Bilirubin Urine: NEGATIVE
GLUCOSE, UA: NEGATIVE
HYALINE CAST: NONE SEEN /LPF
Ketones, ur: NEGATIVE
Nitrite: NEGATIVE
PH: 5.5 (ref 5.0–8.0)
Specific Gravity, Urine: 1.015 (ref 1.001–1.03)
Squamous Epithelial / LPF: NONE SEEN /HPF (ref <=5)

## 2018-02-25 LAB — MICROALBUMIN / CREATININE URINE RATIO
Creatinine, Urine: 107 mg/dL (ref 20–320)
MICROALB UR: 33.9 mg/dL
MICROALB/CREAT RATIO: 317 ug/mg{creat} — AB (ref <30)

## 2018-02-25 LAB — LIPID PANEL
CHOL/HDL RATIO: 4.7 (calc) (ref <5.0)
Cholesterol: 135 mg/dL (ref <200)
HDL: 29 mg/dL — AB (ref >40)
LDL Cholesterol (Calc): 63 mg/dL (calc)
NON-HDL CHOLESTEROL (CALC): 106 mg/dL (ref <130)
Triglycerides: 359 mg/dL — ABNORMAL HIGH (ref <150)

## 2018-02-26 DIAGNOSIS — E1129 Type 2 diabetes mellitus with other diabetic kidney complication: Secondary | ICD-10-CM | POA: Diagnosis not present

## 2018-02-26 DIAGNOSIS — R809 Proteinuria, unspecified: Secondary | ICD-10-CM | POA: Diagnosis not present

## 2018-02-26 DIAGNOSIS — I10 Essential (primary) hypertension: Secondary | ICD-10-CM | POA: Diagnosis not present

## 2018-02-26 DIAGNOSIS — Z79899 Other long term (current) drug therapy: Secondary | ICD-10-CM | POA: Diagnosis not present

## 2018-02-26 DIAGNOSIS — D649 Anemia, unspecified: Secondary | ICD-10-CM | POA: Diagnosis not present

## 2018-02-26 DIAGNOSIS — N184 Chronic kidney disease, stage 4 (severe): Secondary | ICD-10-CM | POA: Diagnosis not present

## 2018-02-26 DIAGNOSIS — E559 Vitamin D deficiency, unspecified: Secondary | ICD-10-CM | POA: Diagnosis not present

## 2018-03-02 ENCOUNTER — Encounter (HOSPITAL_COMMUNITY): Payer: Self-pay | Admitting: Lab

## 2018-03-02 DIAGNOSIS — D500 Iron deficiency anemia secondary to blood loss (chronic): Secondary | ICD-10-CM | POA: Diagnosis not present

## 2018-03-02 DIAGNOSIS — D649 Anemia, unspecified: Secondary | ICD-10-CM | POA: Diagnosis not present

## 2018-03-02 NOTE — Progress Notes (Unsigned)
Patient transferring care to a Dr in [facility] per wife.

## 2018-03-04 DIAGNOSIS — N184 Chronic kidney disease, stage 4 (severe): Secondary | ICD-10-CM | POA: Diagnosis not present

## 2018-03-04 DIAGNOSIS — R809 Proteinuria, unspecified: Secondary | ICD-10-CM | POA: Diagnosis not present

## 2018-03-04 DIAGNOSIS — D638 Anemia in other chronic diseases classified elsewhere: Secondary | ICD-10-CM | POA: Diagnosis not present

## 2018-03-04 DIAGNOSIS — E870 Hyperosmolality and hypernatremia: Secondary | ICD-10-CM | POA: Diagnosis not present

## 2018-03-06 ENCOUNTER — Other Ambulatory Visit (HOSPITAL_COMMUNITY): Payer: Medicare Other

## 2018-03-13 ENCOUNTER — Ambulatory Visit (HOSPITAL_COMMUNITY): Payer: Medicare Other | Admitting: Internal Medicine

## 2018-03-16 ENCOUNTER — Ambulatory Visit (INDEPENDENT_AMBULATORY_CARE_PROVIDER_SITE_OTHER): Payer: Medicare Other | Admitting: Family Medicine

## 2018-03-16 ENCOUNTER — Encounter: Payer: Self-pay | Admitting: Family Medicine

## 2018-03-16 ENCOUNTER — Other Ambulatory Visit (HOSPITAL_COMMUNITY)
Admission: AD | Admit: 2018-03-16 | Discharge: 2018-03-16 | Disposition: A | Discharge status: Home / Self Care | Payer: Medicare Other | Source: Skilled Nursing Facility | Attending: Family Medicine | Admitting: Family Medicine

## 2018-03-16 ENCOUNTER — Other Ambulatory Visit: Payer: Self-pay

## 2018-03-16 VITALS — BP 156/78 | HR 70 | Temp 97.9°F | Ht 74.0 in | Wt 191.1 lb

## 2018-03-16 DIAGNOSIS — N3001 Acute cystitis with hematuria: Secondary | ICD-10-CM

## 2018-03-16 DIAGNOSIS — R300 Dysuria: Secondary | ICD-10-CM | POA: Insufficient documentation

## 2018-03-16 DIAGNOSIS — Z9889 Other specified postprocedural states: Secondary | ICD-10-CM | POA: Insufficient documentation

## 2018-03-16 DIAGNOSIS — Z960 Presence of urogenital implants: Secondary | ICD-10-CM

## 2018-03-16 LAB — POCT URINALYSIS DIPSTICK
Bilirubin, UA: NEGATIVE
Glucose, UA: NEGATIVE
KETONES UA: NEGATIVE
NITRITE UA: POSITIVE
PH UA: 6 (ref 5.0–8.0)
PROTEIN UA: 100
UROBILINOGEN UA: 0.2 U/dL

## 2018-03-16 MED ORDER — HYDROCODONE-ACETAMINOPHEN 5-325 MG PO TABS: 1.0000 | ORAL_TABLET | Freq: Four times a day (QID) | ORAL | 0 refills | Status: DC | PRN

## 2018-03-16 MED ORDER — NITROFURANTOIN MONOHYD MACRO 100 MG PO CAPS: 100.0000 mg | ORAL_CAPSULE | Freq: Two times a day (BID) | ORAL | 0 refills | Status: DC

## 2018-03-16 NOTE — Patient Instructions (Signed)
Drink plenty of water Take the antibiotic as instructed Use pain medicine as needed Follow up with Dr Lynden Oxford

## 2018-03-16 NOTE — Progress Notes (Signed)
Chief Complaint  Patient presents with  . Pain in lower area  Patient is here for an acute visit. He has an indwelling left-sided urinary stent for hydronephrosis and drainage.  It is ongoing.  It is changed every 3 months. He states that he has stent irritative symptoms with lower abdominal pain, left testicle pain, and achiness and burning sensation that is nearly constant.  In addition he has been having occasional stabbing pains in this same area.  Sometimes he feels stabbing pains in his "prostate" (rectum).  No trouble with bowels or bladder.  No relationship of the stabbing pain to movement or activity, voiding, or bowel movements.  He has no fever or chills.  No nausea or vomiting.  No pain up in his flank or kidney area.  He sees Dr. Alyson Ingles in urology on a regular basis.  He would like a refill of the pain medication Dr. Noah Delaine gave him at the time of his last stent change.  We discussed pain medication, side effects, potential for addiction, potential for falls.  Potential for constipation.   Patient Active Problem List   Diagnosis Date Noted  . Chronic blood loss anemia 01/08/2018  . Anemia 01/08/2018  . Acute blood loss anemia   . Iron deficiency anemia due to chronic blood loss 01/08/2017  . Melena   . Acute on chronic renal failure ([place]) 07/29/2016  . Symptomatic anemia 07/29/2016  . Adenocarcinoma of prostate ([place]) 09/06/2014  . Type 2 diabetes mellitus with diabetic neuropathy, unspecified ([place]) 09/06/2014  . Hyperlipidemia, unspecified 12/14/2013  . CAD (coronary artery disease) of artery bypass graft 08/27/2013  . HTN (hypertension) 08/27/2013  . Hyperlipidemia 08/27/2013  . S/P CABG x 3 08/27/2013  . Abdominal aortic aneurysm ([place]) 04/22/2013    Outpatient Encounter Medications as of 03/16/2018  Medication Sig  . acetaminophen (TYLENOL) 325 MG tablet Take 325 mg by mouth every 6 (six) hours as needed for moderate pain or headache.  . B Complex Vitamins (B  COMPLEX-B12 PO) Take 1 drop by mouth daily.   . cholecalciferol (VITAMIN D) 1000 units tablet Take 1,000 Units by mouth 2 (two) times daily.  . finasteride (PROSCAR) 5 MG tablet Take 5 mg by mouth daily.  Marland Kitchen gabapentin (NEURONTIN) 300 MG capsule Take 1 capsule (300 mg total) by mouth at bedtime.  Marland Kitchen glimepiride (AMARYL) 2 MG tablet Take 1 tablet (2 mg total) by mouth daily with breakfast. (Patient taking differently: Take 2 mg by mouth 2 (two) times daily. )  . HYDROcodone-acetaminophen (NORCO) 5-325 MG tablet Take 1 tablet by mouth every 6 (six) hours as needed for moderate pain.  . mirabegron ER (MYRBETRIQ) 25 MG TB24 tablet Take 1 tablet (25 mg total) by mouth daily.  . Naphazoline-Pheniramine (OPCON-A) 0.027-0.315 % SOLN Place 1 drop into both eyes daily as needed (for dry eyes).  . nitroGLYCERIN (NITROSTAT) 0.4 MG SL tablet Place 1 tablet (0.4 mg total) under the tongue every 5 (five) minutes as needed for chest pain.  Marland Kitchen omeprazole (PRILOSEC) 20 MG capsule Take 1 capsule (20 mg total) by mouth 2 (two) times daily before a meal. (Patient taking differently: Take 20 mg by mouth daily. )  . sertraline (ZOLOFT) 50 MG tablet Take 50 mg by mouth daily.  . simvastatin (ZOCOR) 80 MG tablet Take 80 mg by mouth at bedtime.  . tamsulosin (FLOMAX) 0.4 MG CAPS Take 0.4 mg by mouth daily after supper.  . [DISCONTINUED] HYDROcodone-acetaminophen (NORCO) 5-325 MG tablet Take 1 tablet  by mouth every 6 (six) hours as needed for moderate pain.  Marland Kitchen aspirin EC 81 MG tablet Take 1 tablet (81 mg total) by mouth daily. (Patient not taking: Reported on 03/16/2018)  . nitrofurantoin, macrocrystal-monohydrate, (MACROBID) 100 MG capsule Take 1 capsule (100 mg total) by mouth 2 (two) times daily.   No facility-administered encounter medications on file as of 03/16/2018.     Allergies  Allergen Reactions  . Penicillins Rash and Other (See Comments)    Happened in [phone] Has patient had a PCN reaction causing immediate  rash, facial/tongue/throat swelling, SOB or lightheaddness with hypotension: Yes - pt states he "just broke out a little but" after taking Penicillin Has patient had a PCN reaction causing severe rash involving mucus membranes or skin necrosis: Unknown Has patient had a PCN reaction that required hospitalization: No Has patient had a PCN reaction occurring within the last 10 years: No     Review of Systems  Constitutional: Negative for activity change, appetite change, chills, fatigue, fever and unexpected weight change.  HENT: Negative for congestion, dental problem and hearing loss.   Eyes: Negative for photophobia and visual disturbance.  Respiratory: Negative for cough and shortness of breath.   Cardiovascular: Negative for chest pain, palpitations and leg swelling.  Gastrointestinal: Negative for abdominal pain, constipation and diarrhea.  Genitourinary: Positive for frequency and testicular pain. Negative for difficulty urinating, flank pain and hematuria.  Musculoskeletal: Positive for arthralgias.       Occasional  Skin: Negative for color change and pallor.  Neurological: Negative for dizziness and headaches.  Psychiatric/Behavioral: Negative for dysphoric mood. The patient is not nervous/anxious.     BP (!) 156/78   Pulse 70   Temp 97.9 F (36.6 C)   Ht 6\' 2"  (1.88 m)   Wt 191 lb 1.3 oz (86.7 kg)   SpO2 98%   BMI 24.53 kg/m   Physical Exam  Constitutional: He is oriented to person, place, and time. He appears well-developed and well-nourished. No distress.  Pleasant elderly gentleman.  Struggles a little to get up off exam chair  HENT:  Head: Normocephalic and atraumatic.  Mouth/Throat: Oropharynx is clear and moist.  dentures  Eyes: Conjunctivae are normal. Pupils are equal, round, and reactive to light.  Neck: Normal range of motion. Neck supple.  Cardiovascular: Normal rate, regular rhythm and normal heart sounds.  Pulmonary/Chest: Effort normal and breath  sounds normal. He has no rales.  Abdominal: Soft. Bowel sounds are normal. There is no tenderness.  No CVA tenderness.  No tenderness to palpation of abdomen.  Genitourinary:  Genitourinary Comments: No tenderness to palpation of groin, scrotum, testicles.  No rash  Musculoskeletal: Normal range of motion. He exhibits no edema.  Neurological: He is alert and oriented to person, place, and time.  Skin: Skin is warm and dry. No pallor.  Good color today  Psychiatric: He has a normal mood and affect. His behavior is normal.   Results for orders placed or performed in visit on 03/16/18  POCT urinalysis dipstick  Result Value Ref Range   Color, UA yellow    Clarity, UA cloudy    Glucose, UA negative    Bilirubin, UA negative    Ketones, UA negative    Spec Grav, UA >=1.030 (A) 1.010 - 1.025   Blood, UA moderate    pH, UA 6.0 5.0 - 8.0   Protein, UA 100    Urobilinogen, UA 0.2 0.2 or 1.0 E.U./dL   Nitrite, UA positive  Leukocytes, UA Large (3+)  Negative    ASSESSMENT/PLAN:  1. Status post placement of ureteral stent Discussed the usual stent irritative symptoms are usually controlled by the medicines he is on, tamsulosin and finasteride.  In addition he may need pain medicine infrequently.  This is not a good medicine to take daily or frequently.  Side effects are cautioned.  He may be able to take a urinary anesthetic such as Pyridium with additional benefit.  I will asked Dr. Noah Delaine. - Urine Culture - POCT urinalysis dipstick  2. Dysuria Likely urinary infection based on UA. - Urine Culture - POCT urinalysis dipstick  3. Acute cystitis with hematuria Urine culture.   Patient Instructions  Drink plenty of water Take the antibiotic as instructed Use pain medicine as needed Follow up with Dr Billee Cashing Lorene Dy, MD

## 2018-03-19 LAB — URINE CULTURE

## 2018-03-30 DIAGNOSIS — D500 Iron deficiency anemia secondary to blood loss (chronic): Secondary | ICD-10-CM | POA: Diagnosis not present

## 2018-03-30 DIAGNOSIS — D649 Anemia, unspecified: Secondary | ICD-10-CM | POA: Diagnosis not present

## 2018-04-01 DIAGNOSIS — K4020 Bilateral inguinal hernia, without obstruction or gangrene, not specified as recurrent: Secondary | ICD-10-CM | POA: Diagnosis not present

## 2018-04-01 DIAGNOSIS — Z7982 Long term (current) use of aspirin: Secondary | ICD-10-CM | POA: Diagnosis not present

## 2018-04-01 DIAGNOSIS — C61 Malignant neoplasm of prostate: Secondary | ICD-10-CM | POA: Diagnosis not present

## 2018-04-01 DIAGNOSIS — Z880 Allergy status to penicillin: Secondary | ICD-10-CM | POA: Diagnosis not present

## 2018-04-01 DIAGNOSIS — I2510 Atherosclerotic heart disease of native coronary artery without angina pectoris: Secondary | ICD-10-CM | POA: Diagnosis not present

## 2018-04-01 DIAGNOSIS — Z79899 Other long term (current) drug therapy: Secondary | ICD-10-CM | POA: Diagnosis not present

## 2018-04-01 DIAGNOSIS — E1122 Type 2 diabetes mellitus with diabetic chronic kidney disease: Secondary | ICD-10-CM | POA: Diagnosis not present

## 2018-04-01 DIAGNOSIS — Z951 Presence of aortocoronary bypass graft: Secondary | ICD-10-CM | POA: Diagnosis not present

## 2018-04-01 DIAGNOSIS — N183 Chronic kidney disease, stage 3 (moderate): Secondary | ICD-10-CM | POA: Diagnosis not present

## 2018-04-01 DIAGNOSIS — Z6824 Body mass index (BMI) 24.0-24.9, adult: Secondary | ICD-10-CM | POA: Diagnosis not present

## 2018-04-01 DIAGNOSIS — D500 Iron deficiency anemia secondary to blood loss (chronic): Secondary | ICD-10-CM | POA: Diagnosis not present

## 2018-04-01 DIAGNOSIS — R159 Full incontinence of feces: Secondary | ICD-10-CM | POA: Diagnosis not present

## 2018-04-01 DIAGNOSIS — H8110 Benign paroxysmal vertigo, unspecified ear: Secondary | ICD-10-CM | POA: Diagnosis not present

## 2018-04-01 DIAGNOSIS — R208 Other disturbances of skin sensation: Secondary | ICD-10-CM | POA: Diagnosis not present

## 2018-04-01 DIAGNOSIS — I714 Abdominal aortic aneurysm, without rupture: Secondary | ICD-10-CM | POA: Diagnosis not present

## 2018-04-01 DIAGNOSIS — E538 Deficiency of other specified B group vitamins: Secondary | ICD-10-CM | POA: Diagnosis not present

## 2018-04-01 DIAGNOSIS — I129 Hypertensive chronic kidney disease with stage 1 through stage 4 chronic kidney disease, or unspecified chronic kidney disease: Secondary | ICD-10-CM | POA: Diagnosis not present

## 2018-04-01 DIAGNOSIS — Z7984 Long term (current) use of oral hypoglycemic drugs: Secondary | ICD-10-CM | POA: Diagnosis not present

## 2018-04-01 DIAGNOSIS — D649 Anemia, unspecified: Secondary | ICD-10-CM | POA: Diagnosis not present

## 2018-04-01 DIAGNOSIS — Z923 Personal history of irradiation: Secondary | ICD-10-CM | POA: Diagnosis not present

## 2018-04-01 DIAGNOSIS — Z87442 Personal history of urinary calculi: Secondary | ICD-10-CM | POA: Diagnosis not present

## 2018-04-01 DIAGNOSIS — E785 Hyperlipidemia, unspecified: Secondary | ICD-10-CM | POA: Diagnosis not present

## 2018-04-01 DIAGNOSIS — Z8546 Personal history of malignant neoplasm of prostate: Secondary | ICD-10-CM | POA: Diagnosis not present

## 2018-04-01 DIAGNOSIS — Z960 Presence of urogenital implants: Secondary | ICD-10-CM | POA: Diagnosis not present

## 2018-04-01 DIAGNOSIS — E0590 Thyrotoxicosis, unspecified without thyrotoxic crisis or storm: Secondary | ICD-10-CM | POA: Diagnosis not present

## 2018-04-01 DIAGNOSIS — Z87891 Personal history of nicotine dependence: Secondary | ICD-10-CM | POA: Diagnosis not present

## 2018-04-10 DIAGNOSIS — D500 Iron deficiency anemia secondary to blood loss (chronic): Secondary | ICD-10-CM | POA: Diagnosis not present

## 2018-04-14 NOTE — Patient Instructions (Signed)
Andrew Zhang  04/14/2018     @PREFPERIOPPHARMACY @   Your procedure is scheduled on  04/22/2018   Report to [facility] at  1015   A.M.  Call this number if you have problems the morning of surgery:  [phone]   Remember:  Do not eat food or drink liquids after midnight.  Take these medicines the morning of surgery with A SIP OF WATER  Hydrocodone, prilosec, zoloft.   Do not wear jewelry, make-up or nail polish.  Do not wear lotions, powders, or perfumes, or deodorant.  Do not shave 48 hours prior to surgery.  Men may shave face and neck.  Do not bring valuables to the hospital.  [facility] is not responsible for any belongings or valuables.  Contacts, dentures or bridgework may not be worn into surgery.  Leave your suitcase in the car.  After surgery it may be brought to your room.  For patients admitted to the hospital, discharge time will be determined by your treatment team.  Patients discharged the day of surgery will not be allowed to drive home.   Name and phone number of your driver:   family Special instructions:  None  Please read over the following fact sheets that you were given. Anesthesia Post-op Instructions and Care and Recovery After Surgery       Cystoscopy Cystoscopy is a procedure that is used to help diagnose and sometimes treat conditions that affect that lower urinary tract. The lower urinary tract includes the bladder and the tube that drains urine from the bladder out of the body (urethra). Cystoscopy is performed with a thin, tube-shaped instrument with a light and camera at the end (cystoscope). The cystoscope may be hard (rigid) or flexible, depending on the goal of the procedure.The cystoscope is inserted through the urethra, into the bladder. Cystoscopy may be recommended if you have:  Urinary tractinfections that keep coming back (recurring).  Blood in the urine (hematuria).  Loss of bladder control (urinary  incontinence) or an overactive bladder.  Unusual cells found in a urine sample.  A blockage in the urethra.  Painful urination.  An abnormality in the bladder found during an intravenous pyelogram (IVP) or CT scan.  Cystoscopy may also be done to remove a sample of tissue to be examined under a microscope (biopsy). Tell a health care provider about:  Any allergies you have.  All medicines you are taking, including vitamins, herbs, eye drops, creams, and over-the-counter medicines.  Any problems you or family members have had with anesthetic medicines.  Any blood disorders you have.  Any surgeries you have had.  Any medical conditions you have.  Whether you are pregnant or may be pregnant. What are the risks? Generally, this is a safe procedure. However, problems may occur, including:  Infection.  Bleeding.  Allergic reactions to medicines.  Damage to other structures or organs.  What happens before the procedure?  Ask your health care provider about: ? Changing or stopping your regular medicines. This is especially important if you are taking diabetes medicines or blood thinners. ? Taking medicines such as aspirin and ibuprofen. These medicines can thin your blood. Do not take these medicines before your procedure if your health care provider instructs you not to.  Follow instructions from your health care provider about eating or drinking restrictions.  You may be given antibiotic medicine to help prevent infection.  You may have  an exam or testing, such as X-rays of the bladder, urethra, or kidneys.  You may have urine tests to check for signs of infection.  Plan to have someone take you home after the procedure. What happens during the procedure?  To reduce your risk of infection,your health care team will wash or sanitize their hands.  You will be given one or more of the following: ? A medicine to help you relax (sedative). ? A medicine to numb the area  (local anesthetic).  The area around the opening of your urethra will be cleaned.  The cystoscope will be passed through your urethra into your bladder.  Germ-free (sterile)fluid will flow through the cystoscope to fill your bladder. The fluid will stretch your bladder so that your surgeon can clearly examine your bladder walls.  The cystoscope will be removed and your bladder will be emptied. The procedure may vary among health care providers and hospitals. What happens after the procedure?  You may have some soreness or pain in your abdomen and urethra. Medicines will be available to help you.  You may have some blood in your urine.  Do not drive for 24 hours if you received a sedative. This information is not intended to replace advice given to you by your health care provider. Make sure you discuss any questions you have with your health care provider. Document Released: 12/13/2000 Document Revised: 04/25/2016 Document Reviewed: 11/02/2015 Elsevier Interactive Patient Education  2018 [employer].  Cystoscopy, Care After Refer to this sheet in the next few weeks. These instructions provide you with information about caring for yourself after your procedure. Your health care provider may also give you more specific instructions. Your treatment has been planned according to current medical practices, but problems sometimes occur. Call your health care provider if you have any problems or questions after your procedure. What can I expect after the procedure? After the procedure, it is common to have:  Mild pain when you urinate. Pain should stop within a few minutes after you urinate. This may last for up to 1 week.  A small amount of blood in your urine for several days.  Feeling like you need to urinate but producing only a small amount of urine.  Follow these instructions at home:  Medicines  Take over-the-counter and prescription medicines only as told by your health care  provider.  If you were prescribed an antibiotic medicine, take it as told by your health care provider. Do not stop taking the antibiotic even if you start to feel better. General instructions   Return to your normal activities as told by your health care provider. Ask your health care provider what activities are safe for you.  Do not drive for 24 hours if you received a sedative.  Watch for any blood in your urine. If the amount of blood in your urine increases, call your health care provider.  Follow instructions from your health care provider about eating or drinking restrictions.  If a tissue sample was removed for testing (biopsy) during your procedure, it is your responsibility to get your test results. Ask your health care provider or the department performing the test when your results will be ready.  Drink enough fluid to keep your urine clear or pale yellow.  Keep all follow-up visits as told by your health care provider. This is important. Contact a health care provider if:  You have pain that gets worse or does not get better with medicine, especially pain when  you urinate.  You have difficulty urinating. Get help right away if:  You have more blood in your urine.  You have blood clots in your urine.  You have abdominal pain.  You have a fever or chills.  You are unable to urinate. This information is not intended to replace advice given to you by your health care provider. Make sure you discuss any questions you have with your health care provider. Document Released: 07/05/2005 Document Revised: 05/23/2016 Document Reviewed: 11/02/2015 Elsevier Interactive Patient Education  2018 [employer].  Ureteral Stent Implantation Ureteral stent implantation is a procedure to insert (implant) a flexible, soft, plastic tube (stent) into a tube (ureter) that drains urine from the kidneys. The stent supports the ureter while it heals and helps to drain urine from the  kidneys. You may have a ureteral stent implanted after having a procedure to remove a blockage from the ureter (ureterolysis or pyeloplasty).You may also have a stent implanted to open the flow of urine when you have a blockage caused by a kidney stone, tumor, blood clot, or infection. You have two ureters, one on each side of the body. The ureters connect the kidneys to the organ that holds urine until it passes out of the body (bladder). The stent is placed so that one end is in the kidney, and one end is in the bladder. The stent is usually taken out after your ureter has healed. Depending on your condition, you may have a stent for just a few weeks, or you may have a long-term stent that will need to be replaced every few months. Tell a health care provider about:  Any allergies you have.  All medicines you are taking, including vitamins, herbs, eye drops, creams, and over-the-counter medicines.  Any problems you or family members have had with anesthetic medicines.  Any blood disorders you have.  Any surgeries you have had.  Any medical conditions you have.  Whether you are pregnant or may be pregnant. What are the risks? Generally, this is a safe procedure. However, problems may occur, including:  Infection.  Bleeding.  Allergic reactions to medicines.  Damage to other structures or organs. Tearing (perforation) of the ureter is possible.  Movement of the stent away from where it is placed during surgery (migration).  What happens before the procedure?  Ask your health care provider about: ? Changing or stopping your regular medicines. This is especially important if you are taking diabetes medicines or blood thinners. ? Taking medicines such as aspirin and ibuprofen. These medicines can thin your blood. Do not take these medicines before your procedure if your health care provider instructs you not to.  Follow instructions from your health care provider about eating or  drinking restrictions.  Do not drink alcohol and do not use any tobacco products before your procedure, as told by your health care provider.  You may be given antibiotic medicine to help prevent infection.  Plan to have someone take you home after the procedure.  If you go home right after the procedure, plan to have someone with you for 24 hours. What happens during the procedure?  An IV tube will be inserted into one of your veins.  You will be given a medicine to make you fall asleep (general anesthetic). You may also be given a medicine to help you relax (sedative).  A thin, tube-shaped instrument with a light and tiny camera at the end (cystoscope) will be inserted into your urethra. The urethra is  the tube that drains urine from the bladder out of the body. In men, the urethra opens at the end of the penis. In women, the urethra opens in front of the vaginal opening.  The cystoscope will be passed into your bladder.  A thin wire (guide wire) will be passed through your bladder and into your ureter. This is used to guide the stent into your ureter.  The stent will be inserted into your ureter.  The guide wire and the cystoscope will be removed.  A flexible tube (catheter) will be inserted through your urethra so that one end is in your bladder. This helps to drain urine from your bladder. The procedure may vary among hospitals and health care providers. What happens after the procedure?  Your blood pressure, heart rate, breathing rate, and blood oxygen level will be monitored often until the medicines you were given have worn off.  You may continue to receive medicine and fluids through an IV tube.  You may have some soreness or pain in your abdomen and urethra. Medicines will be available to help you.  You will be encouraged to get up and walk around as soon as you can.  You will have a catheter draining your urine.  You will have some blood in your urine.  Do not  drive for 24 hours if you received a sedative. This information is not intended to replace advice given to you by your health care provider. Make sure you discuss any questions you have with your health care provider. Document Released: 12/13/2000 Document Revised: 05/23/2016 Document Reviewed: 06/30/2015 Elsevier Interactive Patient Education  2018 [employer].  Ureteral Stent Implantation, Care After Refer to this sheet in the next few weeks. These instructions provide you with information about caring for yourself after your procedure. Your health care provider may also give you more specific instructions. Your treatment has been planned according to current medical practices, but problems sometimes occur. Call your health care provider if you have any problems or questions after your procedure. What can I expect after the procedure? After the procedure, it is common to have:  Nausea.  Mild pain when you urinate. You may feel this pain in your lower back or lower abdomen. Pain should stop within a few minutes after you urinate. This may last for up to 1 week.  A small amount of blood in your urine for several days.  Follow these instructions at home:  Medicines  Take over-the-counter and prescription medicines only as told by your health care provider.  If you were prescribed an antibiotic medicine, take it as told by your health care provider. Do not stop taking the antibiotic even if you start to feel better.  Do not drive for 24 hours if you received a sedative.  Do not drive or operate heavy machinery while taking prescription pain medicines. Activity  Return to your normal activities as told by your health care provider. Ask your health care provider what activities are safe for you.  Do not lift anything that is heavier than 10 lb (4.5 kg). Follow this limit for 1 week after your procedure, or for as long as told by your health care provider. General instructions  Watch  for any blood in your urine. Call your health care provider if the amount of blood in your urine increases.  If you have a catheter: ? Follow instructions from your health care provider about taking care of your catheter and collection bag. ? Do not  take baths, swim, or use a hot tub until your health care provider approves.  Drink enough fluid to keep your urine clear or pale yellow.  Keep all follow-up visits as told by your health care provider. This is important. Contact a health care provider if:  You have pain that gets worse or does not get better with medicine, especially pain when you urinate.  You have difficulty urinating.  You feel nauseous or you vomit repeatedly during a period of more than 2 days after the procedure. Get help right away if:  Your urine is dark red or has blood clots in it.  You are leaking urine (have incontinence).  The end of the stent comes out of your urethra.  You cannot urinate.  You have sudden, sharp, or severe pain in your abdomen or lower back.  You have a fever. This information is not intended to replace advice given to you by your health care provider. Make sure you discuss any questions you have with your health care provider. Document Released: 08/18/2013 Document Revised: 05/23/2016 Document Reviewed: 06/30/2015 Elsevier Interactive Patient Education  2018 [place] Anesthesia, Adult General anesthesia is the use of medicines to make a person "go to sleep" (be unconscious) for a medical procedure. General anesthesia is often recommended when a procedure:  Is long.  Requires you to be still or in an unusual position.  Is major and can cause you to lose blood.  Is impossible to do without general anesthesia.  The medicines used for general anesthesia are called general anesthetics. In addition to making you sleep, the medicines:  Prevent pain.  Control your blood pressure.  Relax your muscles.  Tell a  health care provider about:  Any allergies you have.  All medicines you are taking, including vitamins, herbs, eye drops, creams, and over-the-counter medicines.  Any problems you or family members have had with anesthetic medicines.  Types of anesthetics you have had in the past.  Any bleeding disorders you have.  Any surgeries you have had.  Any medical conditions you have.  Any history of heart or lung conditions, such as heart failure, sleep apnea, or chronic obstructive pulmonary disease (COPD).  Whether you are pregnant or may be pregnant.  Whether you use tobacco, alcohol, marijuana, or street drugs.  Any history of Armed forces logistics/support/administrative officer.  Any history of depression or anxiety. What are the risks? Generally, this is a safe procedure. However, problems may occur, including:  Allergic reaction to anesthetics.  Lung and heart problems.  Inhaling food or liquids from your stomach into your lungs (aspiration).  Injury to nerves.  Waking up during your procedure and being unable to move (rare).  Extreme agitation or a state of mental confusion (delirium) when you wake up from the anesthetic.  Air in the bloodstream, which can lead to stroke.  These problems are more likely to develop if you are having a major surgery or if you have an advanced medical condition. You can prevent some of these complications by answering all of your health care provider's questions thoroughly and by following all pre-procedure instructions. General anesthesia can cause side effects, including:  Nausea or vomiting  A sore throat from the breathing tube.  Feeling cold or shivery.  Feeling tired, washed out, or achy.  Sleepiness or drowsiness.  Confusion or agitation.  What happens before the procedure? Staying hydrated Follow instructions from your health care provider about hydration, which may include:  Up to 2 hours before  the procedure - you may continue to drink clear liquids,  such as water, clear fruit juice, black coffee, and plain tea.  Eating and drinking restrictions Follow instructions from your health care provider about eating and drinking, which may include:  8 hours before the procedure - stop eating heavy meals or foods such as meat, fried foods, or fatty foods.  6 hours before the procedure - stop eating light meals or foods, such as toast or cereal.  6 hours before the procedure - stop drinking milk or drinks that contain milk.  2 hours before the procedure - stop drinking clear liquids.  Medicines  Ask your health care provider about: ? Changing or stopping your regular medicines. This is especially important if you are taking diabetes medicines or blood thinners. ? Taking medicines such as aspirin and ibuprofen. These medicines can thin your blood. Do not take these medicines before your procedure if your health care provider instructs you not to. ? Taking new dietary supplements or medicines. Do not take these during the week before your procedure unless your health care provider approves them.  If you are told to take a medicine or to continue taking a medicine on the day of the procedure, take the medicine with sips of water. General instructions   Ask if you will be going home the same day, the following day, or after a longer hospital stay. ? Plan to have someone take you home. ? Plan to have someone stay with you for the first 24 hours after you leave the hospital or clinic.  For 3-6 weeks before the procedure, try not to use any tobacco products, such as cigarettes, chewing tobacco, and e-cigarettes.  You may brush your teeth on the morning of the procedure, but make sure to spit out the toothpaste. What happens during the procedure?  You will be given anesthetics through a mask and through an IV tube in one of your veins.  You may receive medicine to help you relax (sedative).  As soon as you are asleep, a breathing tube may be  used to help you breathe.  An anesthesia specialist will stay with you throughout the procedure. He or she will help keep you comfortable and safe by continuing to give you medicines and adjusting the amount of medicine that you get. He or she will also watch your blood pressure, pulse, and oxygen levels to make sure that the anesthetics do not cause any problems.  If a breathing tube was used to help you breathe, it will be removed before you wake up. The procedure may vary among health care providers and hospitals. What happens after the procedure?  You will wake up, often slowly, after the procedure is complete, usually in a recovery area.  Your blood pressure, heart rate, breathing rate, and blood oxygen level will be monitored until the medicines you were given have worn off.  You may be given medicine to help you calm down if you feel anxious or agitated.  If you will be going home the same day, your health care provider may check to make sure you can stand, drink, and urinate.  Your health care providers will treat your pain and side effects before you go home.  Do not drive for 24 hours if you received a sedative.  You may: ? Feel nauseous and vomit. ? Have a sore throat. ? Have mental slowness. ? Feel cold or shivery. ? Feel sleepy. ? Feel tired. ? Feel sore or achy,  even in parts of your body where you did not have surgery. This information is not intended to replace advice given to you by your health care provider. Make sure you discuss any questions you have with your health care provider. Document Released: 03/24/2008 Document Revised: 05/28/2016 Document Reviewed: 11/30/2015 Elsevier Interactive Patient Education  2018 [place] Anesthesia, Adult, Care After These instructions provide you with information about caring for yourself after your procedure. Your health care provider may also give you more specific instructions. Your treatment has been planned  according to current medical practices, but problems sometimes occur. Call your health care provider if you have any problems or questions after your procedure. What can I expect after the procedure? After the procedure, it is common to have:  Vomiting.  A sore throat.  Mental slowness.  It is common to feel:  Nauseous.  Cold or shivery.  Sleepy.  Tired.  Sore or achy, even in parts of your body where you did not have surgery.  Follow these instructions at home: For at least 24 hours after the procedure:  Do not: ? Participate in activities where you could fall or become injured. ? Drive. ? Use heavy machinery. ? Drink alcohol. ? Take sleeping pills or medicines that cause drowsiness. ? Make important decisions or sign legal documents. ? Take care of children on your own.  Rest. Eating and drinking  If you vomit, drink water, juice, or soup when you can drink without vomiting.  Drink enough fluid to keep your urine clear or pale yellow.  Make sure you have little or no nausea before eating solid foods.  Follow the diet recommended by your health care provider. General instructions  Have a responsible adult stay with you until you are awake and alert.  Return to your normal activities as told by your health care provider. Ask your health care provider what activities are safe for you.  Take over-the-counter and prescription medicines only as told by your health care provider.  If you smoke, do not smoke without supervision.  Keep all follow-up visits as told by your health care provider. This is important. Contact a health care provider if:  You continue to have nausea or vomiting at home, and medicines are not helpful.  You cannot drink fluids or start eating again.  You cannot urinate after 8-12 hours.  You develop a skin rash.  You have fever.  You have increasing redness at the site of your procedure. Get help right away if:  You have  difficulty breathing.  You have chest pain.  You have unexpected bleeding.  You feel that you are having a life-threatening or urgent problem. This information is not intended to replace advice given to you by your health care provider. Make sure you discuss any questions you have with your health care provider. Document Released: 03/24/2001 Document Revised: 05/20/2016 Document Reviewed: 11/30/2015 Elsevier Interactive Patient Education  [employer].

## 2018-04-16 ENCOUNTER — Other Ambulatory Visit: Payer: Self-pay

## 2018-04-16 ENCOUNTER — Encounter (HOSPITAL_COMMUNITY)
Admission: RE | Admit: 2018-04-16 | Discharge: 2018-04-16 | Disposition: A | Discharge status: Home / Self Care | Payer: Medicare Other | Source: Ambulatory Visit | Attending: Urology | Admitting: Urology

## 2018-04-16 ENCOUNTER — Encounter (HOSPITAL_COMMUNITY): Payer: Self-pay

## 2018-04-16 DIAGNOSIS — I447 Left bundle-branch block, unspecified: Secondary | ICD-10-CM | POA: Diagnosis not present

## 2018-04-16 DIAGNOSIS — I440 Atrioventricular block, first degree: Secondary | ICD-10-CM | POA: Diagnosis not present

## 2018-04-16 DIAGNOSIS — Z01812 Encounter for preprocedural laboratory examination: Secondary | ICD-10-CM | POA: Diagnosis not present

## 2018-04-16 DIAGNOSIS — Z01818 Encounter for other preprocedural examination: Secondary | ICD-10-CM | POA: Diagnosis not present

## 2018-04-16 LAB — CBC WITH DIFFERENTIAL/PLATELET
Basophils Absolute: 0 10*3/uL (ref 0.0–0.1)
Basophils Relative: 0 %
EOS ABS: 0.1 10*3/uL (ref 0.0–0.7)
EOS PCT: 3 %
HCT: 33.1 % — ABNORMAL LOW (ref 39.0–52.0)
HEMOGLOBIN: 10.5 g/dL — AB (ref 13.0–17.0)
Lymphocytes Relative: 19 %
Lymphs Abs: 0.9 10*3/uL (ref 0.7–4.0)
MCH: 31.4 pg (ref 26.0–34.0)
MCHC: 31.7 g/dL (ref 30.0–36.0)
MCV: 99.1 fL (ref 78.0–100.0)
MONOS PCT: 7 %
Monocytes Absolute: 0.3 10*3/uL (ref 0.1–1.0)
Neutro Abs: 3.4 10*3/uL (ref 1.7–7.7)
Neutrophils Relative %: 71 %
PLATELETS: 130 10*3/uL — AB (ref 150–400)
RBC: 3.34 MIL/uL — ABNORMAL LOW (ref 4.22–5.81)
RDW: 14.3 % (ref 11.5–15.5)
WBC: 4.7 10*3/uL (ref 4.0–10.5)

## 2018-04-16 LAB — BASIC METABOLIC PANEL
Anion gap: 9 (ref 5–15)
BUN: 35 mg/dL — AB (ref 6–20)
CHLORIDE: 107 mmol/L (ref 101–111)
CO2: 23 mmol/L (ref 22–32)
CREATININE: 2.31 mg/dL — AB (ref 0.61–1.24)
Calcium: 8.7 mg/dL — ABNORMAL LOW (ref 8.9–10.3)
GFR calc Af Amer: 29 mL/min — ABNORMAL LOW (ref >60)
GFR calc non Af Amer: 25 mL/min — ABNORMAL LOW (ref >60)
GLUCOSE: 114 mg/dL — AB (ref 65–99)
Potassium: 4.4 mmol/L (ref 3.5–5.1)
SODIUM: 139 mmol/L (ref 135–145)

## 2018-04-16 LAB — GLUCOSE, CAPILLARY: Glucose-Capillary: 104 mg/dL — ABNORMAL HIGH (ref 65–99)

## 2018-04-22 ENCOUNTER — Ambulatory Visit (HOSPITAL_COMMUNITY): Payer: Medicare Other | Admitting: Anesthesiology

## 2018-04-22 ENCOUNTER — Encounter (HOSPITAL_COMMUNITY): Payer: Self-pay | Admitting: Emergency Medicine

## 2018-04-22 ENCOUNTER — Ambulatory Visit (HOSPITAL_COMMUNITY): Payer: Medicare Other

## 2018-04-22 ENCOUNTER — Encounter (HOSPITAL_COMMUNITY)
Admission: RE | Disposition: A | Discharge status: Home / Self Care | Payer: Self-pay | Source: Ambulatory Visit | Attending: Urology

## 2018-04-22 ENCOUNTER — Ambulatory Visit (HOSPITAL_COMMUNITY)
Admission: RE | Admit: 2018-04-22 | Discharge: 2018-04-22 | Disposition: A | Discharge status: Home / Self Care | Payer: Medicare Other | Source: Ambulatory Visit | Attending: Urology | Admitting: Urology

## 2018-04-22 DIAGNOSIS — E1122 Type 2 diabetes mellitus with diabetic chronic kidney disease: Secondary | ICD-10-CM | POA: Insufficient documentation

## 2018-04-22 DIAGNOSIS — N189 Chronic kidney disease, unspecified: Secondary | ICD-10-CM | POA: Diagnosis not present

## 2018-04-22 DIAGNOSIS — F329 Major depressive disorder, single episode, unspecified: Secondary | ICD-10-CM | POA: Insufficient documentation

## 2018-04-22 DIAGNOSIS — Z8719 Personal history of other diseases of the digestive system: Secondary | ICD-10-CM | POA: Insufficient documentation

## 2018-04-22 DIAGNOSIS — G4730 Sleep apnea, unspecified: Secondary | ICD-10-CM | POA: Insufficient documentation

## 2018-04-22 DIAGNOSIS — Z87891 Personal history of nicotine dependence: Secondary | ICD-10-CM | POA: Diagnosis not present

## 2018-04-22 DIAGNOSIS — R300 Dysuria: Secondary | ICD-10-CM | POA: Insufficient documentation

## 2018-04-22 DIAGNOSIS — Z8546 Personal history of malignant neoplasm of prostate: Secondary | ICD-10-CM | POA: Insufficient documentation

## 2018-04-22 DIAGNOSIS — I10 Essential (primary) hypertension: Secondary | ICD-10-CM | POA: Diagnosis not present

## 2018-04-22 DIAGNOSIS — K449 Diaphragmatic hernia without obstruction or gangrene: Secondary | ICD-10-CM | POA: Insufficient documentation

## 2018-04-22 DIAGNOSIS — Z87442 Personal history of urinary calculi: Secondary | ICD-10-CM | POA: Diagnosis not present

## 2018-04-22 DIAGNOSIS — Z880 Allergy status to penicillin: Secondary | ICD-10-CM | POA: Diagnosis not present

## 2018-04-22 DIAGNOSIS — Q6210 Congenital occlusion of ureter, unspecified: Secondary | ICD-10-CM | POA: Diagnosis present

## 2018-04-22 DIAGNOSIS — Z9889 Other specified postprocedural states: Secondary | ICD-10-CM | POA: Insufficient documentation

## 2018-04-22 DIAGNOSIS — R350 Frequency of micturition: Secondary | ICD-10-CM | POA: Insufficient documentation

## 2018-04-22 DIAGNOSIS — I2510 Atherosclerotic heart disease of native coronary artery without angina pectoris: Secondary | ICD-10-CM | POA: Insufficient documentation

## 2018-04-22 DIAGNOSIS — N35919 Unspecified urethral stricture, male, unspecified site: Secondary | ICD-10-CM | POA: Diagnosis not present

## 2018-04-22 DIAGNOSIS — N131 Hydronephrosis with ureteral stricture, not elsewhere classified: Secondary | ICD-10-CM | POA: Insufficient documentation

## 2018-04-22 DIAGNOSIS — I129 Hypertensive chronic kidney disease with stage 1 through stage 4 chronic kidney disease, or unspecified chronic kidney disease: Secondary | ICD-10-CM | POA: Diagnosis not present

## 2018-04-22 DIAGNOSIS — Z801 Family history of malignant neoplasm of trachea, bronchus and lung: Secondary | ICD-10-CM | POA: Insufficient documentation

## 2018-04-22 DIAGNOSIS — I714 Abdominal aortic aneurysm, without rupture, unspecified: Secondary | ICD-10-CM

## 2018-04-22 DIAGNOSIS — Z833 Family history of diabetes mellitus: Secondary | ICD-10-CM | POA: Insufficient documentation

## 2018-04-22 DIAGNOSIS — E538 Deficiency of other specified B group vitamins: Secondary | ICD-10-CM | POA: Insufficient documentation

## 2018-04-22 DIAGNOSIS — C61 Malignant neoplasm of prostate: Secondary | ICD-10-CM

## 2018-04-22 DIAGNOSIS — Z8379 Family history of other diseases of the digestive system: Secondary | ICD-10-CM | POA: Insufficient documentation

## 2018-04-22 DIAGNOSIS — Z820 Family history of epilepsy and other diseases of the nervous system: Secondary | ICD-10-CM | POA: Insufficient documentation

## 2018-04-22 DIAGNOSIS — Z951 Presence of aortocoronary bypass graft: Secondary | ICD-10-CM | POA: Diagnosis not present

## 2018-04-22 DIAGNOSIS — D631 Anemia in chronic kidney disease: Secondary | ICD-10-CM | POA: Diagnosis not present

## 2018-04-22 DIAGNOSIS — Z800 Family history of malignant neoplasm of digestive organs: Secondary | ICD-10-CM | POA: Insufficient documentation

## 2018-04-22 DIAGNOSIS — R3915 Urgency of urination: Secondary | ICD-10-CM | POA: Insufficient documentation

## 2018-04-22 DIAGNOSIS — Z811 Family history of alcohol abuse and dependence: Secondary | ICD-10-CM | POA: Insufficient documentation

## 2018-04-22 DIAGNOSIS — I739 Peripheral vascular disease, unspecified: Secondary | ICD-10-CM | POA: Insufficient documentation

## 2018-04-22 DIAGNOSIS — D696 Thrombocytopenia, unspecified: Secondary | ICD-10-CM

## 2018-04-22 DIAGNOSIS — M1990 Unspecified osteoarthritis, unspecified site: Secondary | ICD-10-CM | POA: Diagnosis not present

## 2018-04-22 DIAGNOSIS — K219 Gastro-esophageal reflux disease without esophagitis: Secondary | ICD-10-CM | POA: Diagnosis not present

## 2018-04-22 DIAGNOSIS — D61818 Other pancytopenia: Secondary | ICD-10-CM | POA: Insufficient documentation

## 2018-04-22 DIAGNOSIS — Z9842 Cataract extraction status, left eye: Secondary | ICD-10-CM | POA: Insufficient documentation

## 2018-04-22 DIAGNOSIS — Z8249 Family history of ischemic heart disease and other diseases of the circulatory system: Secondary | ICD-10-CM | POA: Insufficient documentation

## 2018-04-22 DIAGNOSIS — Z79899 Other long term (current) drug therapy: Secondary | ICD-10-CM | POA: Insufficient documentation

## 2018-04-22 DIAGNOSIS — Z9049 Acquired absence of other specified parts of digestive tract: Secondary | ICD-10-CM | POA: Insufficient documentation

## 2018-04-22 DIAGNOSIS — E785 Hyperlipidemia, unspecified: Secondary | ICD-10-CM | POA: Diagnosis not present

## 2018-04-22 DIAGNOSIS — Z961 Presence of intraocular lens: Secondary | ICD-10-CM | POA: Insufficient documentation

## 2018-04-22 HISTORY — PX: CYSTOSCOPY W/ URETERAL STENT PLACEMENT: SHX1429

## 2018-04-22 LAB — GLUCOSE, CAPILLARY
GLUCOSE-CAPILLARY: 167 mg/dL — AB (ref 65–99)
Glucose-Capillary: 132 mg/dL — ABNORMAL HIGH (ref 65–99)

## 2018-04-22 SURGERY — CYSTOSCOPY, WITH RETROGRADE PYELOGRAM AND URETERAL STENT INSERTION
Anesthesia: General | Site: Ureter | Laterality: Left

## 2018-04-22 MED ORDER — PROPOFOL 10 MG/ML IV BOLUS
INTRAVENOUS | Status: DC | PRN
  Administered 2018-04-22: 20 mg via INTRAVENOUS
  Administered 2018-04-22: 125 mg via INTRAVENOUS

## 2018-04-22 MED ORDER — SODIUM CHLORIDE 0.9 % IR SOLN
Status: DC | PRN
  Administered 2018-04-22: 3000 mL

## 2018-04-22 MED ORDER — LIDOCAINE HCL (CARDIAC) PF 50 MG/5ML IV SOSY
PREFILLED_SYRINGE | INTRAVENOUS | Status: DC | PRN
  Administered 2018-04-22: 40 mg via INTRAVENOUS

## 2018-04-22 MED ORDER — DIATRIZOATE MEGLUMINE 30 % UR SOLN
URETHRAL | Status: DC | PRN
  Administered 2018-04-22: 10 mL via URETHRAL

## 2018-04-22 MED ORDER — HYDROMORPHONE HCL 1 MG/ML IJ SOLN: 0.2500 mg | INTRAMUSCULAR | Status: DC | PRN

## 2018-04-22 MED ORDER — HYDROCODONE-ACETAMINOPHEN 5-325 MG PO TABS: 1.0000 | ORAL_TABLET | Freq: Four times a day (QID) | ORAL | 0 refills | Status: DC | PRN

## 2018-04-22 MED ORDER — DIATRIZOATE MEGLUMINE 30 % UR SOLN
URETHRAL | Status: AC
  Filled 2018-04-22: qty 100

## 2018-04-22 MED ORDER — FENTANYL CITRATE (PF) 100 MCG/2ML IJ SOLN
INTRAMUSCULAR | Status: DC | PRN
  Administered 2018-04-22 (×3): 25 ug via INTRAVENOUS

## 2018-04-22 MED ORDER — PROPOFOL 10 MG/ML IV BOLUS
INTRAVENOUS | Status: AC
  Filled 2018-04-22: qty 20

## 2018-04-22 MED ORDER — LIDOCAINE HCL (PF) 1 % IJ SOLN
INTRAMUSCULAR | Status: AC
  Filled 2018-04-22: qty 5

## 2018-04-22 MED ORDER — CEFAZOLIN SODIUM-DEXTROSE 2-4 GM/100ML-% IV SOLN
2.0000 g | INTRAVENOUS | Status: AC
  Administered 2018-04-22: 2 g via INTRAVENOUS
  Filled 2018-04-22: qty 100

## 2018-04-22 MED ORDER — FENTANYL CITRATE (PF) 100 MCG/2ML IJ SOLN
INTRAMUSCULAR | Status: AC
  Filled 2018-04-22: qty 2

## 2018-04-22 MED ORDER — LACTATED RINGERS IV SOLN
INTRAVENOUS | Status: DC
  Administered 2018-04-22: 14:00:00 via INTRAVENOUS

## 2018-04-22 MED ORDER — HYDROCODONE-ACETAMINOPHEN 7.5-325 MG PO TABS: 1.0000 | ORAL_TABLET | Freq: Once | ORAL | Status: DC | PRN

## 2018-04-22 MED ORDER — MEPERIDINE HCL 50 MG/ML IJ SOLN: 6.2500 mg | INTRAMUSCULAR | Status: DC | PRN

## 2018-04-22 MED ORDER — ONDANSETRON HCL 4 MG/2ML IJ SOLN: 4.0000 mg | Freq: Once | INTRAMUSCULAR | Status: DC | PRN

## 2018-04-22 MED ORDER — MIDAZOLAM HCL 2 MG/2ML IJ SOLN
INTRAMUSCULAR | Status: AC
  Filled 2018-04-22: qty 2

## 2018-04-22 MED ORDER — STERILE WATER FOR IRRIGATION IR SOLN
Status: DC | PRN
  Administered 2018-04-22: 1000 mL

## 2018-04-22 SURGICAL SUPPLY — 20 items
BAG DRAIN URO TABLE W/ADPT NS (DRAPE) ×3 IMPLANT
BAG HAMPER (MISCELLANEOUS) ×3 IMPLANT
CATH INTERMIT  6FR 70CM (CATHETERS) ×3 IMPLANT
CLOTH BEACON ORANGE TIMEOUT ST (SAFETY) ×3 IMPLANT
DECANTER SPIKE VIAL GLASS SM (MISCELLANEOUS) ×3 IMPLANT
GLOVE BIO SURGEON STRL SZ8 (GLOVE) ×3 IMPLANT
GLOVE ECLIPSE 6.5 STRL STRAW (GLOVE) ×3 IMPLANT
GOWN STRL REUS W/ TWL XL LVL3 (GOWN DISPOSABLE) ×1 IMPLANT
GOWN STRL REUS W/TWL LRG LVL3 (GOWN DISPOSABLE) ×3 IMPLANT
GOWN STRL REUS W/TWL XL LVL3 (GOWN DISPOSABLE) ×2
GUIDEWIRE STR DUAL SENSOR (WIRE) ×3 IMPLANT
GUIDEWIRE STR ZIPWIRE 035X150 (MISCELLANEOUS) ×3 IMPLANT
IV NS IRRIG 3000ML ARTHROMATIC (IV SOLUTION) ×3 IMPLANT
KIT ROOM TURNOVER AP CYSTO (KITS) ×3 IMPLANT
MANIFOLD NEPTUNE II (INSTRUMENTS) ×3 IMPLANT
PACK CYSTO (CUSTOM PROCEDURE TRAY) ×3 IMPLANT
PAD ARMBOARD 7.5X6 YLW CONV (MISCELLANEOUS) ×3 IMPLANT
STENT CONTOUR 7FRX24 (STENTS) ×3 IMPLANT
SYRINGE 10CC LL (SYRINGE) ×3 IMPLANT
TOWEL OR 17X26 4PK STRL BLUE (TOWEL DISPOSABLE) ×3 IMPLANT

## 2018-04-22 NOTE — Anesthesia Procedure Notes (Signed)
Procedure Name: LMA Insertion Date/Time: 04/22/2018 2:32 PM Performed by: Ollen Bowl, CRNA Pre-anesthesia Checklist: Patient identified, Patient being monitored, Emergency Drugs available, Timeout performed and Suction available Patient Re-evaluated:Patient Re-evaluated prior to induction Oxygen Delivery Method: Circle System Utilized Preoxygenation: Pre-oxygenation with 100% oxygen Induction Type: IV induction Ventilation: Mask ventilation without difficulty LMA: LMA inserted LMA Size: 5.0 Number of attempts: 1 Placement Confirmation: positive ETCO2 and breath sounds checked- equal and bilateral

## 2018-04-22 NOTE — Transfer of Care (Signed)
Immediate Anesthesia Transfer of Care Note  Patient: Andrew Zhang  Procedure(s) Performed: CYSTOSCOPY WITH LEFT RETROGRADE PYELOGRAM/LEFT URETERAL STENT PLACEMENT, STENT EXCHANGE (Left Ureter)  Patient Location: PACU  Anesthesia Type:General  Level of Consciousness: awake and alert   Airway & Oxygen Therapy: Patient Spontanous Breathing and Patient connected to face mask oxygen  Post-op Assessment: Report given to RN  Post vital signs: Reviewed and stable  Last Vitals:  Vitals Value Taken Time  BP 127/71 04/22/2018  3:00 PM  Temp    Pulse 88 04/22/2018  3:02 PM  Resp 13 04/22/2018  3:02 PM  SpO2 100 % 04/22/2018  3:02 PM  Vitals shown include unvalidated device data.  Last Pain:  Vitals:   04/22/18 1145  TempSrc: Oral  PainSc: 0-No pain         Complications: No apparent anesthesia complications

## 2018-04-22 NOTE — Discharge Instructions (Signed)
Ureteral Stent Implantation, Care After Refer to this sheet in the next few weeks. These instructions provide you with information about caring for yourself after your procedure. Your health care provider may also give you more specific instructions. Your treatment has been planned according to current medical practices, but problems sometimes occur. Call your health care provider if you have any problems or questions after your procedure. What can I expect after the procedure? After the procedure, it is common to have:  Nausea.  Mild pain when you urinate. You may feel this pain in your lower back or lower abdomen. Pain should stop within a few minutes after you urinate. This may last for up to 1 week.  A small amount of blood in your urine for several days.  Follow these instructions at home:  Medicines  Take over-the-counter and prescription medicines only as told by your health care provider.  If you were prescribed an antibiotic medicine, take it as told by your health care provider. Do not stop taking the antibiotic even if you start to feel better.  Do not drive for 24 hours if you received a sedative.  Do not drive or operate heavy machinery while taking prescription pain medicines. Activity  Return to your normal activities as told by your health care provider. Ask your health care provider what activities are safe for you.  Do not lift anything that is heavier than 10 lb (4.5 kg). Follow this limit for 1 week after your procedure, or for as long as told by your health care provider. General instructions  Watch for any blood in your urine. Call your health care provider if the amount of blood in your urine increases.  If you have a catheter: ? Follow instructions from your health care provider about taking care of your catheter and collection bag. ? Do not take baths, swim, or use a hot tub until your health care provider approves.  Drink enough fluid to keep your urine  clear or pale yellow.  Keep all follow-up visits as told by your health care provider. This is important. Contact a health care provider if:  You have pain that gets worse or does not get better with medicine, especially pain when you urinate.  You have difficulty urinating.  You feel nauseous or you vomit repeatedly during a period of more than 2 days after the procedure. Get help right away if:  Your urine is dark red or has blood clots in it.  You are leaking urine (have incontinence).  The end of the stent comes out of your urethra.  You cannot urinate.  You have sudden, sharp, or severe pain in your abdomen or lower back.  You have a fever. This information is not intended to replace advice given to you by your health care provider. Make sure you discuss any questions you have with your health care provider. Document Released: 08/18/2013 Document Revised: 05/23/2016 Document Reviewed: 06/30/2015 Elsevier Interactive Patient Education  2018 [place] Anesthesia, Adult, Care After These instructions provide you with information about caring for yourself after your procedure. Your health care provider may also give you more specific instructions. Your treatment has been planned according to current medical practices, but problems sometimes occur. Call your health care provider if you have any problems or questions after your procedure. What can I expect after the procedure? After the procedure, it is common to have:  Vomiting.  A sore throat.  Mental slowness.  It is  common to feel:  Nauseous.  Cold or shivery.  Sleepy.  Tired.  Sore or achy, even in parts of your body where you did not have surgery.  Follow these instructions at home: For at least 24 hours after the procedure:  Do not: ? Participate in activities where you could fall or become injured. ? Drive. ? Use heavy machinery. ? Drink alcohol. ? Take sleeping pills or medicines  that cause drowsiness. ? Make important decisions or sign legal documents. ? Take care of children on your own.  Rest. Eating and drinking  If you vomit, drink water, juice, or soup when you can drink without vomiting.  Drink enough fluid to keep your urine clear or pale yellow.  Make sure you have little or no nausea before eating solid foods.  Follow the diet recommended by your health care provider. General instructions  Have a responsible adult stay with you until you are awake and alert.  Return to your normal activities as told by your health care provider. Ask your health care provider what activities are safe for you.  Take over-the-counter and prescription medicines only as told by your health care provider.  If you smoke, do not smoke without supervision.  Keep all follow-up visits as told by your health care provider. This is important. Contact a health care provider if:  You continue to have nausea or vomiting at home, and medicines are not helpful.  You cannot drink fluids or start eating again.  You cannot urinate after 8-12 hours.  You develop a skin rash.  You have fever.  You have increasing redness at the site of your procedure. Get help right away if:  You have difficulty breathing.  You have chest pain.  You have unexpected bleeding.  You feel that you are having a life-threatening or urgent problem. This information is not intended to replace advice given to you by your health care provider. Make sure you discuss any questions you have with your health care provider. Document Released: 03/24/2001 Document Revised: 05/20/2016 Document Reviewed: 11/30/2015 Elsevier Interactive Patient Education  [employer].

## 2018-04-22 NOTE — Anesthesia Postprocedure Evaluation (Signed)
Anesthesia Post Note  Patient: Andrew Zhang  Procedure(s) Performed: CYSTOSCOPY WITH LEFT RETROGRADE PYELOGRAM/LEFT URETERAL STENT PLACEMENT, STENT EXCHANGE (Left Ureter)  Patient location during evaluation: PACU Anesthesia Type: General Level of consciousness: awake and alert and oriented Pain management: pain level controlled Vital Signs Assessment: post-procedure vital signs reviewed and stable Respiratory status: spontaneous breathing and respiratory function stable Cardiovascular status: stable Postop Assessment: no apparent nausea or vomiting Anesthetic complications: no     Last Vitals:  Vitals:   04/22/18 1500 04/22/18 1515  BP: 127/71 (!) 142/78  Pulse: 73 84  Resp: 11 16  Temp: 36.8 C   SpO2: 100% 98%    Last Pain:  Vitals:   04/22/18 1500  TempSrc:   PainSc: 0-No pain                 ADAMS, AMY A

## 2018-04-22 NOTE — H&P (Signed)
Urology Admission H&P  Chief Complaint: left ureteral stricture  History of Present Illness: [name] is a 84yo with a left ureteral stricture managed with an indwelling ureteral stent. He has severe LUTS with the stent in place. No hematuria  Past Medical History:  Diagnosis Date  . AAA (abdominal aortic aneurysm) ([place])   . Anemia   . Arthritis   . B12 deficiency   . Blood transfusion without reported diagnosis   . CAD (coronary artery disease)   . Cancer [facility])    radiation for prostate cancer and lupron.  . Cataract   . Complication of anesthesia    sts,"after hearat surgery my stomach didnt wake up like it was supossed to".  . Depression   . Diverticulitis   . DMII (diabetes mellitus, type 2) ([place]) 2008  . GERD (gastroesophageal reflux disease)   . GI bleed   . GIB (gastrointestinal bleeding) 09/18/2016  . Hiatal hernia   . History of kidney stones   . HTN (hypertension)   . Hyperlipidemia   . Iron deficiency anemia due to chronic blood loss 01/08/2017  . Kidney stone   . PONV (postoperative nausea and vomiting)   . Prostate cancer ([place])   . Sleep apnea    cannot tolerate, PCP aware  . Ureteral stricture, left    Past Surgical History:  Procedure Laterality Date  . APPENDECTOMY    . BACK SURGERY     x2  . CATARACT EXTRACTION Left   . CHOLECYSTECTOMY    . COLONOSCOPY  03/2015   Dr. Britta Mccreedy: Diverticulosis, single tubular adenoma removed.  . COLONOSCOPY WITH PROPOFOL N/A 02/25/2017   Dr. Oneida Alar: non-thrombosed external hemorrhoids, significant looping of left colon. severe diverticulosis in recto-sigmoid colon and sigmoid colon. 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 CORONARY ARTERY BYPASS GRAFT     1995  . CYSTOSCOPY     with stent-left kidney. x2  . CYSTOSCOPY W/ URETERAL STENT PLACEMENT Left 05/14/2017   Procedure: CYSTOSCOPY WITH RETROGRADE  PYELOGRAM/URETERAL STENT EXCHANGE;  Surgeon: Cleon Gustin, MD;  Location: [facility];  Service: Urology;  Laterality: Left;  . CYSTOSCOPY W/ URETERAL STENT PLACEMENT Left 10/13/2017   Procedure: CYSTOSCOPY WITH LEFT RETROGRADE PYELOGRAM/LEFT URETERAL STENT REPLACEMENT;  Surgeon: Cleon Gustin, MD;  Location: [facility];  Service: Urology;  Laterality: Left;  . CYSTOSCOPY W/ URETERAL STENT PLACEMENT Left 01/07/2018   Procedure: CYSTOSCOPY WITH RETROGRADE PYELOGRAM/URETERAL STENT PLACEMENT;  Surgeon: Cleon Gustin, MD;  Location: [facility];  Service: Urology;  Laterality: Left;  30 MINS STENT "REPLACEMENT" [phone] [id number]  . CYSTOSCOPY WITH FULGERATION  08/13/2017   Procedure: CYSTOSCOPY WITH FULGERATION OF BLADDER;  Surgeon: Cleon Gustin, MD;  Location: [facility];  Service: Urology;;  . Consuela Mimes WITH RETROGRADE PYELOGRAM, URETEROSCOPY AND STENT PLACEMENT Left 08/13/2017   Procedure: CYSTOSCOPY WITH LEFT RETROGRADE PYELOGRAM, LEFT URETEROSCOPY AND STENT REPLACEMENT;  Surgeon: Cleon Gustin, MD;  Location: [facility];  Service: Urology;  Laterality: Left;  . DESCENDING AORTIC ANEURYSM REPAIR W/ STENT     x2  . ESOPHAGOGASTRODUODENOSCOPY N/A 07/30/2016   Dr. Oneida Alar. Nonobstructing Schatzki ring at GE junction, multiple small sessile polyps with no bleeding or stigmata of recent bleeding in the gastric fundus and gastric body. Benign-appearing intrinsic moderate stenosis found the pylorus status post dilation. Small bowel capsule deployed.  Marland Kitchen GIVENS CAPSULE STUDY     Capsule study is complete to the cecum. No obvious lesions, mass,  tumors. Small wisps of blood noted in small bowel secondary to EGD/dilation. Possible few erosions in setting of NSAIDs. No obvious areas of bleeding.  Marland Kitchen GIVENS CAPSULE STUDY N/A 11/29/2017   Dr. Laural Golden: no evidence of active bleeding in stomach or small bowel. Multiple gastric petechia, single small AVM in proximal small bowel,  scattered petechia involving small bowel mucosa.   Marland Kitchen HERNIA REPAIR    . HOLMIUM LASER APPLICATION Left [id number]/[id number]   Procedure: HOLMIUM LASER LITHOTRIPSY OF ENCRUSTED LEFT URETERAL STENT;  Surgeon: Cleon Gustin, MD;  Location: [facility];  Service: Urology;  Laterality: Left;  . SPINE SURGERY     ruptured discs    Home Medications:  Current Facility-Administered Medications  Medication Dose Route Frequency Provider Last Rate Last Dose  . ceFAZolin (ANCEF) IVPB 2g/100 mL premix  2 g Intravenous 30 min Pre-Op Alyson Ingles Candee Furbish, MD      . lactated ringers infusion   Intravenous Continuous Nicanor Alcon, MD 50 mL/hr at 04/22/18 1351     Allergies:  Allergies  Allergen Reactions  . Penicillins Rash and Other (See Comments)    Happened in [phone] Has patient had a PCN reaction causing immediate rash, facial/tongue/throat swelling, SOB or lightheaddness with hypotension: Yes - pt states he "just broke out a little but" after taking Penicillin Has patient had a PCN reaction causing severe rash involving mucus membranes or skin necrosis: Unknown Has patient had a PCN reaction that required hospitalization: No Has patient had a PCN reaction occurring within the last 10 years: No     Family History  Problem Relation Age of Onset  . Heart disease Unknown   . Diabetes Unknown   . Hypertension Unknown   . Liver cancer Mother   . Arthritis Mother   . Cancer Mother        died at 18  . Heart disease Mother   . Alzheimer's disease Father   . Cancer Brother        lung  . Cancer Son 63       lung  . Hepatitis Brother   . Alcohol abuse Brother   . Heart disease Son 28       heart attack, stents  . Diabetes Son   . Colon cancer Neg Hx    Social History:  reports that he quit smoking about 24 years ago. His smoking use included cigarettes. He started smoking about 67 years ago. He has a 135.00 pack-year smoking history. He has never used smokeless tobacco. He reports that he drinks  alcohol. He reports that he does not use drugs.  Review of Systems  Genitourinary: Positive for dysuria, frequency and urgency.  All other systems reviewed and are negative.   Physical Exam:  Vital signs in last 24 hours: Temp:  [97.6 F (36.4 C)] 97.6 F (36.4 C) (04/24 1145) Pulse Rate:  [75] 75 (04/24 1145) Resp:  [13-21] 21 (04/24 1330) BP: (150-168)/(72-85) 157/82 (04/24 1330) SpO2:  [91 %-96 %] 91 % (04/24 1330) Physical Exam  Constitutional: He is oriented to person, place, and time. He appears well-developed and well-nourished.  HENT:  Head: Normocephalic and atraumatic.  Eyes: Pupils are equal, round, and reactive to light. EOM are normal.  Neck: Normal range of motion. No thyromegaly present.  Cardiovascular: Normal rate and regular rhythm.  Respiratory: Effort normal. No respiratory distress.  GI: Soft. He exhibits no distension.  Musculoskeletal: Normal range of motion. He exhibits no edema.  Neurological: He is alert and  oriented to person, place, and time.  Skin: Skin is warm and dry.  Psychiatric: He has a normal mood and affect. His behavior is normal. Judgment and thought content normal.    Laboratory Data:  Results for orders placed or performed during the hospital encounter of 04/22/18 (from the past 24 hour(s))  Glucose, capillary     Status: Abnormal   Collection Time: 04/22/18 11:57 AM  Result Value Ref Range   Glucose-Capillary 167 (H) 65 - 99 mg/dL   No results found for this or any previous visit (from the past 240 hour(s)). Creatinine: Recent Labs    04/16/18 1246  CREATININE 2.31*   Baseline Creatinine: 2.  Impression/Assessment:  84yo with left ureteral stricture  Plan:  The risks/benefits/alternatives to left ureteral stent exchange was explained to the patient and he understands and wishes to proceed with surgery  Nicolette Bang 04/22/2018, 2:01 PM

## 2018-04-22 NOTE — Anesthesia Preprocedure Evaluation (Signed)
Anesthesia Evaluation  Patient identified by MRN, date of birth, ID band Patient awake    Reviewed: Allergy & Precautions, H&P , NPO status , Patient's Chart, lab work & pertinent test results, reviewed documented beta blocker date and time   History of Anesthesia Complications (+) PONV and history of anesthetic complications  Airway Mallampati: II  TM Distance: >3 FB Neck ROM: full    Dental no notable dental hx. (+) Edentulous Upper, Partial Lower   Pulmonary neg pulmonary ROS, sleep apnea , former smoker,    Pulmonary exam normal breath sounds clear to auscultation       Cardiovascular Exercise Tolerance: Good hypertension, + CAD, + CABG and + Peripheral Vascular Disease  negative cardio ROS   Rhythm:regular Rate:Normal  AAA (abdominal aortic aneurysm) ([place]).Marland Kitchenstented per patient   Neuro/Psych PSYCHIATRIC DISORDERS Depression  Neuromuscular disease negative neurological ROS  negative psych ROS   GI/Hepatic negative GI ROS, Neg liver ROS, hiatal hernia, GERD  ,  Endo/Other  negative endocrine ROSdiabetes, Type 2  Renal/GU Renal diseasenegative Renal ROSstones  negative genitourinary   Musculoskeletal   Abdominal   Peds  Hematology negative hematology ROS (+) anemia ,   Anesthesia Other Findings   Reproductive/Obstetrics negative OB ROS                             Anesthesia Physical Anesthesia Plan  ASA: III  Anesthesia Plan: General   Post-op Pain Management:    Induction:   PONV Risk Score and Plan:   Airway Management Planned:   Additional Equipment:   Intra-op Plan:   Post-operative Plan:   Informed Consent: I have reviewed the patients History and Physical, chart, labs and discussed the procedure including the risks, benefits and alternatives for the proposed anesthesia with the patient or authorized representative who has indicated his/her understanding and  acceptance.   Dental Advisory Given  Plan Discussed with: CRNA  Anesthesia Plan Comments:         Anesthesia Quick Evaluation

## 2018-04-22 NOTE — Op Note (Signed)
Preoperative diagnosis:left ureteral stricture  Postoperative diagnosis:same  Procedure: 1 cystoscopy 2. Left retrograde pyelography 3. Intraoperative fluoroscopy, under one hour, with interpretation 4. Left 7x 24 JJ stent exchange  Attending: Rosie Fate  Anesthesia: General  Estimated blood loss: None  Drains: Left 7x 24 JJ ureteral stent without tether  Specimens: stone for analysis  Antibiotics: ancef  Findings: mildlefthydronephrosis.Stent not encrusted.No masses/lesions in the bladder. Ureteral orifices in normal anatomic location.  Indications: Patient is a 84 year old male with a history a left mid ureteral stricture. After discussing treatment options, he decided proceed with leftstent exchange.  Procedure in detail: The patient was brought to the operating room and a brief timeout was done to ensure correct patient, correct procedure, correct site. General anesthesia was administered patient was placed in dorsal lithotomy position. His genitalia was then prepped and draped in usual sterile fashion. A rigid 5 French cystoscope was passed in the urethra and the bladder. Bladder was inspected free masses or lesions. the ureteral orifices were in the normal orthotopic locations. a 6 french ureteral catheter was then instilled into the left ureteral orifice. a gentle retrograde was obtained and findings noted above. We then used a grasper to bring the ureteral stent to the urethral meatus. we then placed a sensor wire through the ureteral stent and advanced up to the renal pelvis. We then removed the stent we then placed a 7x 24 double-j ureteral stent over the original sensor wire. We then removed the wire and good coil was noted in the the renal pelvis under fluoroscopy and the bladder under direct vision. the bladder was then drained and this concluded the procedure which was well tolerated by patient.  Complications: None  Condition:  Stable, extubated, transferred to PACU  Plan: Patient is to be discharged home as to follow-up in 4 weeks with a renal US.

## 2018-04-24 ENCOUNTER — Encounter (HOSPITAL_COMMUNITY): Payer: Self-pay | Admitting: Urology

## 2018-04-24 DIAGNOSIS — D500 Iron deficiency anemia secondary to blood loss (chronic): Secondary | ICD-10-CM | POA: Diagnosis not present

## 2018-05-04 ENCOUNTER — Encounter: Payer: Self-pay | Admitting: Gastroenterology

## 2018-05-04 ENCOUNTER — Ambulatory Visit (INDEPENDENT_AMBULATORY_CARE_PROVIDER_SITE_OTHER): Payer: Medicare Other | Admitting: Gastroenterology

## 2018-05-04 ENCOUNTER — Encounter: Payer: Self-pay | Admitting: *Deleted

## 2018-05-04 VITALS — BP 150/74 | HR 59 | Temp 96.9°F | Ht 74.0 in | Wt 187.4 lb

## 2018-05-04 DIAGNOSIS — D500 Iron deficiency anemia secondary to blood loss (chronic): Secondary | ICD-10-CM

## 2018-05-04 DIAGNOSIS — K6289 Other specified diseases of anus and rectum: Secondary | ICD-10-CM | POA: Insufficient documentation

## 2018-05-04 DIAGNOSIS — R932 Abnormal findings on diagnostic imaging of liver and biliary tract: Secondary | ICD-10-CM | POA: Diagnosis not present

## 2018-05-04 MED ORDER — HYDROCORTISONE 2.5 % RE CREA: 1.0000 "application " | TOPICAL_CREAM | Freq: Two times a day (BID) | RECTAL | 1 refills | Status: DC

## 2018-05-04 NOTE — Progress Notes (Signed)
Primary Care Physician:  Caren Macadam, MD  Primary GI: Dr. Oneida Alar   Chief Complaint  Patient presents with  . rectal discomfort    bad burning/stinging, feels like something sticking in me    HPI:   Andrew Zhang is a 84 y.o. male presenting today with a history of IDA, transfusion-dependent anemia, extensive GI evaluation for transfusion dependent anemia to include EGD, colonoscopy, capsule study. Colonoscopy with radiation proctitis s/p APC.  Inpatient Dec 2018 for symptomatic anemia. Updated capsule study completed, with multiple gastric petechia but no stigmata of bleed. Single small AVM in proximal small bowel without bleeding stigmata. Scattered petechia involving mucosa of small bowel. He is followed closely by Hematology for iron infusions. He also has had gross hematuria and has been followed by urology. Has undergone ureteral stenting due to stricture. Last Iron infusion 04/10/18.    Has rectal discomfort for the last few months, feels it on the left side and sometimes radiates down into the testicle.  Constipation. Having to use suppositories. About 2-3 days since last BM. Intermittent constipation for past year. When moving around, discomfort doesn't bother him. Usually happens when laying down. Had episode of fecal incontinence the other day while urinating.   CT renal study July 2018: question subtle nodularity of hepatic contours.   Past Medical History:  Diagnosis Date  . AAA (abdominal aortic aneurysm) ([place])   . Anemia   . Arthritis   . B12 deficiency   . Blood transfusion without reported diagnosis   . CAD (coronary artery disease)   . Cancer [facility])    radiation for prostate cancer and lupron.  . Cataract   . Complication of anesthesia    sts,"after hearat surgery my stomach didnt wake up like it was supossed to".  . Depression   . Diverticulitis   . DMII (diabetes mellitus, type 2) ([place]) 2008  . GERD (gastroesophageal reflux disease)   . GI bleed   .  GIB (gastrointestinal bleeding) 09/18/2016  . Hiatal hernia   . History of kidney stones   . HTN (hypertension)   . Hyperlipidemia   . Iron deficiency anemia due to chronic blood loss 01/08/2017  . Kidney stone   . PONV (postoperative nausea and vomiting)   . Prostate cancer ([place])   . Sleep apnea    cannot tolerate, PCP aware  . Ureteral stricture, left     Past Surgical History:  Procedure Laterality Date  . APPENDECTOMY    . BACK SURGERY     x2  . CATARACT EXTRACTION Left   . CHOLECYSTECTOMY    . COLONOSCOPY  03/2015   Dr. Britta Mccreedy: Diverticulosis, single tubular adenoma removed.  . COLONOSCOPY WITH PROPOFOL N/A 02/25/2017   Dr. Oneida Alar: non-thrombosed external hemorrhoids, significant looping of left colon. severe diverticulosis in recto-sigmoid colon and sigmoid colon. 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 CORONARY ARTERY BYPASS GRAFT     1995  . CYSTOSCOPY     with stent-left kidney. x2  . CYSTOSCOPY W/ URETERAL STENT PLACEMENT Left 05/14/2017   Procedure: CYSTOSCOPY WITH RETROGRADE PYELOGRAM/URETERAL STENT EXCHANGE;  Surgeon: Cleon Gustin, MD;  Location: [facility];  Service: Urology;  Laterality: Left;  . CYSTOSCOPY W/ URETERAL STENT PLACEMENT Left 10/13/2017   Procedure: CYSTOSCOPY WITH LEFT RETROGRADE PYELOGRAM/LEFT URETERAL STENT REPLACEMENT;  Surgeon: Cleon Gustin, MD;  Location: [facility];  Service: Urology;  Laterality: Left;  . CYSTOSCOPY W/ URETERAL  STENT PLACEMENT Left 01/07/2018   Procedure: CYSTOSCOPY WITH RETROGRADE PYELOGRAM/URETERAL STENT PLACEMENT;  Surgeon: Cleon Gustin, MD;  Location: [facility];  Service: Urology;  Laterality: Left;  30 MINS STENT "REPLACEMENT" [phone] MEDICARE-[id number] [id number]  . CYSTOSCOPY W/ URETERAL STENT PLACEMENT Left 04/22/2018   Procedure: CYSTOSCOPY WITH LEFT RETROGRADE PYELOGRAM/LEFT URETERAL STENT  PLACEMENT, STENT EXCHANGE;  Surgeon: Cleon Gustin, MD;  Location: [facility];  Service: Urology;  Laterality: Left;  . CYSTOSCOPY WITH FULGERATION  08/13/2017   Procedure: CYSTOSCOPY WITH FULGERATION OF BLADDER;  Surgeon: Cleon Gustin, MD;  Location: [facility];  Service: Urology;;  . Consuela Mimes WITH RETROGRADE PYELOGRAM, URETEROSCOPY AND STENT PLACEMENT Left 08/13/2017   Procedure: CYSTOSCOPY WITH LEFT RETROGRADE PYELOGRAM, LEFT URETEROSCOPY AND STENT REPLACEMENT;  Surgeon: Cleon Gustin, MD;  Location: [facility];  Service: Urology;  Laterality: Left;  . DESCENDING AORTIC ANEURYSM REPAIR W/ STENT     x2  . ESOPHAGOGASTRODUODENOSCOPY N/A 07/30/2016   Dr. Oneida Alar. Nonobstructing Schatzki ring at GE junction, multiple small sessile polyps with no bleeding or stigmata of recent bleeding in the gastric fundus and gastric body. Benign-appearing intrinsic moderate stenosis found the pylorus status post dilation. Small bowel capsule deployed.  Marland Kitchen GIVENS CAPSULE STUDY     Capsule study is complete to the cecum. No obvious lesions, mass, tumors. Small wisps of blood noted in small bowel secondary to EGD/dilation. Possible few erosions in setting of NSAIDs. No obvious areas of bleeding.  Marland Kitchen GIVENS CAPSULE STUDY N/A 11/29/2017   Dr. Laural Golden: no evidence of active bleeding in stomach or small bowel. Multiple gastric petechia, single small AVM in proximal small bowel, scattered petechia involving small bowel mucosa.   Marland Kitchen HERNIA REPAIR    . HOLMIUM LASER APPLICATION Left 09/22/2682   Procedure: HOLMIUM LASER LITHOTRIPSY OF ENCRUSTED LEFT URETERAL STENT;  Surgeon: Cleon Gustin, MD;  Location: [facility];  Service: Urology;  Laterality: Left;  . SPINE SURGERY     ruptured discs    Current Outpatient Medications  Medication Sig Dispense Refill  . acetaminophen (TYLENOL) 325 MG tablet Take 325 mg by mouth every 8 (eight) hours as needed for moderate pain or headache.     . B Complex Vitamins (B COMPLEX-B12 PO)  Take 6-7 drops by mouth daily.     . cholecalciferol (VITAMIN D) 400 units TABS tablet Take 400 Units by mouth 3 (three) times daily.    . diphenhydrAMINE (BENADRYL) 25 MG tablet Take 25 mg by mouth at bedtime as needed for sleep.    . finasteride (PROSCAR) 5 MG tablet Take 5 mg by mouth daily.    Marland Kitchen glimepiride (AMARYL) 2 MG tablet Take 1 tablet (2 mg total) by mouth daily with breakfast. (Patient taking differently: Take 2 mg by mouth 2 (two) times daily. )    . HYDROcodone-acetaminophen (NORCO) 5-325 MG tablet Take 1 tablet by mouth every 6 (six) hours as needed for moderate pain. 15 tablet 0  . Naphazoline-Pheniramine (OPCON-A) 0.027-0.315 % SOLN Place 1 drop into both eyes daily as needed (for dry eyes).    . nitroGLYCERIN (NITROSTAT) 0.4 MG SL tablet Place 1 tablet (0.4 mg total) under the tongue every 5 (five) minutes as needed for chest pain. 25 tablet 3  . omeprazole (PRILOSEC) 20 MG capsule Take 1 capsule (20 mg total) by mouth 2 (two) times daily before a meal. (Patient taking differently: Take 20 mg by mouth daily. ) 60 capsule 0  . sertraline (ZOLOFT) 50 MG tablet Take 25 mg  by mouth daily.     . simvastatin (ZOCOR) 80 MG tablet Take 80 mg by mouth at bedtime.    . tamsulosin (FLOMAX) 0.4 MG CAPS Take 0.4 mg by mouth daily after supper.    . vitamin C (ASCORBIC ACID) 500 MG tablet Take 500 mg by mouth 3 (three) times daily.    . vitamin E 100 UNIT capsule Take 100 Units by mouth 3 (three) times daily.     No current facility-administered medications for this visit.     Allergies as of 05/04/2018 - Review Complete 05/04/2018  Allergen Reaction Noted  . Penicillins Rash and Other (See Comments) 08/27/2013    Family History  Problem Relation Age of Onset  . Heart disease Unknown   . Diabetes Unknown   . Hypertension Unknown   . Liver cancer Mother   . Arthritis Mother   . Cancer Mother        died at 71  . Heart disease Mother   . Alzheimer's disease Father   . Cancer  Brother        lung  . Cancer Son 45       lung  . Hepatitis Brother   . Alcohol abuse Brother   . Heart disease Son 59       heart attack, stents  . Diabetes Son   . Colon cancer Neg Hx     Social History   Socioeconomic History  . Marital status: Married    Spouse name: [name]  . Number of children: 5  . Years of education: 47- GED  . Highest education level: Not on file  Occupational History  . Occupation: retired    Comment: Administrator, [employer]  . Financial resource strain: Not on file  . Food insecurity:    Worry: Not on file    Inability: Not on file  . Transportation needs:    Medical: Not on file    Non-medical: Not on file  Tobacco Use  . Smoking status: Former Smoker    Packs/day: 3.00    Years: 45.00    Pack years: 135.00    Types: Cigarettes    Start date: 04/19/1951    Last attempt to quit: 03/01/1994    Years since quitting: 24.1  . Smokeless tobacco: Never Used  Substance and Sexual Activity  . Alcohol use: Yes    Alcohol/week: 0.0 oz    Comment: 1-2 beers daily   . Drug use: No  . Sexual activity: Yes    Birth control/protection: None    Comment: married 59 years - 5 children   Lifestyle  . Physical activity:    Days per week: Not on file    Minutes per session: Not on file  . Stress: Not on file  Relationships  . Social connections:    Talks on phone: Not on file    Gets together: Not on file    Attends religious service: Not on file    Active member of club or organization: Not on file    Attends meetings of clubs or organizations: Not on file    Relationship status: Not on file  Other Topics Concern  . Not on file  Social History Narrative   Married to [place] for 60+ years   4 years in the [place]   Retired, mows grass    Review of Systems: As mentioned in HPI   Physical Exam: BP (!) 150/74   Pulse (!) 59   Temp (!)  96.9 F (36.1 C) (Oral)   Ht 6\' 2"  (1.88 m)   Wt 187 lb 6.4 oz (85 kg)   BMI 24.06 kg/m  General:    Alert and oriented. No distress noted. Pleasant and cooperative.  Head:  Normocephalic and atraumatic. Eyes:  Conjuctiva clear without scleral icterus. Mouth:  Oral mucosa pink and moist.  Abdomen:  +BS, soft, non-tender and non-distended. No rebound or guarding. No HSM or masses noted. Rectal: prominent prostate on rectal exam, slight discomfort, no obvious external hemorrhoids or fissure  Msk:  Symmetrical without gross deformities. Normal posture. Extremities:  Without edema. Neurologic:  Alert and  oriented x4 Psych:  Alert and cooperative. Normal mood and affect.  Lab Results  Component Value Date   IRON 21 (L) 11/28/2017   TIBC 351 11/28/2017   FERRITIN 17 (L) 11/28/2017   Lab Results  Component Value Date   WBC 4.7 04/16/2018   HGB 10.5 (L) 04/16/2018   HCT 33.1 (L) 04/16/2018   MCV 99.1 04/16/2018   PLT 130 (L) 04/16/2018

## 2018-05-04 NOTE — Patient Instructions (Signed)
I have ordered an ultrasound and further blood work.  I have also sent in a cream to your pharmacy to use per rectum twice a day. Let me know if too expensive or no improvement in your symptoms.   For constipation: Start Amitiza 1 gelcap twice a day with food to avoid nausea. Let me know how this works.  We will see you in 4-6 weeks!  It was a pleasure to see you today. I strive to create trusting relationships with patients to provide genuine, compassionate, and quality care. I value your feedback. If you receive a survey regarding your visit,  I greatly appreciate you taking time to fill this out.   Annitta Needs, PhD, ANP-BC [facility]

## 2018-05-05 DIAGNOSIS — R932 Abnormal findings on diagnostic imaging of liver and biliary tract: Secondary | ICD-10-CM | POA: Diagnosis not present

## 2018-05-06 LAB — HEPATITIS C ANTIBODY
Hepatitis C Ab: NONREACTIVE
SIGNAL TO CUT-OFF: 0.02 (ref <1.00)

## 2018-05-06 LAB — HEPATITIS B SURFACE ANTIBODY,QUALITATIVE: HEP B S AB: NONREACTIVE

## 2018-05-06 LAB — HEPATITIS A ANTIBODY, TOTAL: HEPATITIS A AB,TOTAL: REACTIVE — AB

## 2018-05-06 LAB — HEPATITIS B CORE ANTIBODY, TOTAL: Hep B Core Total Ab: NONREACTIVE

## 2018-05-06 LAB — HEPATITIS B SURFACE ANTIGEN: HEP B S AG: NONREACTIVE

## 2018-05-08 ENCOUNTER — Ambulatory Visit (HOSPITAL_COMMUNITY)
Admission: RE | Admit: 2018-05-08 | Discharge: 2018-05-08 | Disposition: A | Discharge status: Home / Self Care | Payer: Medicare Other | Source: Ambulatory Visit | Attending: Gastroenterology | Admitting: Gastroenterology

## 2018-05-08 DIAGNOSIS — R932 Abnormal findings on diagnostic imaging of liver and biliary tract: Secondary | ICD-10-CM | POA: Diagnosis not present

## 2018-05-08 DIAGNOSIS — I714 Abdominal aortic aneurysm, without rupture: Secondary | ICD-10-CM | POA: Insufficient documentation

## 2018-05-08 DIAGNOSIS — N1330 Unspecified hydronephrosis: Secondary | ICD-10-CM | POA: Insufficient documentation

## 2018-05-08 DIAGNOSIS — Z9049 Acquired absence of other specified parts of digestive tract: Secondary | ICD-10-CM | POA: Diagnosis not present

## 2018-05-08 DIAGNOSIS — N1339 Other hydronephrosis: Secondary | ICD-10-CM | POA: Diagnosis not present

## 2018-05-12 NOTE — Assessment & Plan Note (Signed)
history of IDA, transfusion-dependent anemia, extensive GI evaluation for transfusion dependent anemia to include EGD, colonoscopy, capsule study. Colonoscopy with radiation proctitis s/p APC.  Inpatient Dec 2018 for symptomatic anemia. Updated capsule study completed, with multiple gastric petechia but no stigmata of bleed. Single small AVM in proximal small bowel without bleeding stigmata. Scattered petechia involving mucosa of small bowel. He is followed closely by Hematology for iron infusions. No longer on aspirin. Continue follow-up with Hematology.

## 2018-05-12 NOTE — Assessment & Plan Note (Signed)
No obvious hemorrhoids, fissure. Prominent prostate on rectal exam. Followed by Urology. Anusol cream started BID. Start Amitiza 8 mcg po BID for constipation. Return in 4-6 weeks.

## 2018-05-12 NOTE — Assessment & Plan Note (Signed)
CT renal study July 2018 with question of nodular hepatic contours. Update US abdomen now. Obtain viral serologies. Last LFTs normal in Nov 2018.

## 2018-05-14 ENCOUNTER — Telehealth: Payer: Self-pay

## 2018-05-14 ENCOUNTER — Encounter: Payer: Self-pay | Admitting: Gastroenterology

## 2018-05-14 DIAGNOSIS — K6289 Other specified diseases of anus and rectum: Secondary | ICD-10-CM | POA: Diagnosis not present

## 2018-05-14 DIAGNOSIS — Z87442 Personal history of urinary calculi: Secondary | ICD-10-CM | POA: Diagnosis not present

## 2018-05-14 DIAGNOSIS — N320 Bladder-neck obstruction: Secondary | ICD-10-CM | POA: Diagnosis not present

## 2018-05-14 DIAGNOSIS — K5732 Diverticulitis of large intestine without perforation or abscess without bleeding: Secondary | ICD-10-CM | POA: Diagnosis not present

## 2018-05-14 DIAGNOSIS — K219 Gastro-esophageal reflux disease without esophagitis: Secondary | ICD-10-CM | POA: Diagnosis not present

## 2018-05-14 DIAGNOSIS — E119 Type 2 diabetes mellitus without complications: Secondary | ICD-10-CM | POA: Diagnosis not present

## 2018-05-14 DIAGNOSIS — E7800 Pure hypercholesterolemia, unspecified: Secondary | ICD-10-CM | POA: Diagnosis not present

## 2018-05-14 DIAGNOSIS — Z79899 Other long term (current) drug therapy: Secondary | ICD-10-CM | POA: Diagnosis not present

## 2018-05-14 DIAGNOSIS — N410 Acute prostatitis: Secondary | ICD-10-CM | POA: Diagnosis not present

## 2018-05-14 DIAGNOSIS — N1339 Other hydronephrosis: Secondary | ICD-10-CM | POA: Diagnosis not present

## 2018-05-14 DIAGNOSIS — K5792 Diverticulitis of intestine, part unspecified, without perforation or abscess without bleeding: Secondary | ICD-10-CM | POA: Diagnosis not present

## 2018-05-14 DIAGNOSIS — I10 Essential (primary) hypertension: Secondary | ICD-10-CM | POA: Diagnosis not present

## 2018-05-14 DIAGNOSIS — Z87891 Personal history of nicotine dependence: Secondary | ICD-10-CM | POA: Diagnosis not present

## 2018-05-14 MED ORDER — HYDROCORTISONE 100 MG/60ML RE ENEM: ENEMA | RECTAL | 0 refills | Status: DC

## 2018-05-14 NOTE — Progress Notes (Signed)
Pt is aware.  

## 2018-05-14 NOTE — Progress Notes (Signed)
PT is aware.

## 2018-05-14 NOTE — Telephone Encounter (Signed)
Pt had OV with Roseanne Kaufman, NP on 05/04/2018. He is still having the rectal pain ( feels like something sticking in his rectum and burning). Said the [place] did not help at all. He had to sit on the mower this afternoon and it has really bothered him.  Michela Pitcher he has got to get some help. Forwarding to Neil Crouch, PA to review in Anna's absence.

## 2018-05-14 NOTE — Telephone Encounter (Signed)
Stop Anusol.  Start Hydrocortisone enema, 100mg  per 6mL every AM and at bedtime for 2 weeks, then just at bedtime for two weeks. Keep upcoming ov. Call if ongoing symptoms.   Save for AB review and any additional recommendations.

## 2018-05-14 NOTE — Progress Notes (Signed)
Hep B and C negative. He needs Hep B vaccination; he is immune to Hepatitis A. Sent note in [place]

## 2018-05-14 NOTE — Telephone Encounter (Signed)
Called and left the message on VM.  Elmo Putt, would you call pt on Friday to make sure he got the message in my absence. Forwarding FYI to [place] also.

## 2018-05-14 NOTE — Progress Notes (Signed)
Spleen is upper limits normal in size. Liver is coarsened but no obvious nodularity. With low platelets, upper limits of normal spleen, could possibly have some underlying liver disease. Could consider elastography vs fibrosure test. Will discuss at next visit.

## 2018-05-15 NOTE — Telephone Encounter (Signed)
Noted. Can we please get ED records, none in Epic. ?went to [facility].  I would like to see any imaging such as CT scan, labs, etc.

## 2018-05-15 NOTE — Telephone Encounter (Signed)
Spoke with pts spouse. Pt souse said pt went t the ED yesterday and was treated. A Cath was placed and pt will follow his kidney doctor. Pts spouse didn't want to do the suggestions recommended, due to the plan put in place at the ED and pts kidney specialist.

## 2018-05-18 DIAGNOSIS — I10 Essential (primary) hypertension: Secondary | ICD-10-CM | POA: Diagnosis not present

## 2018-05-18 DIAGNOSIS — E7800 Pure hypercholesterolemia, unspecified: Secondary | ICD-10-CM | POA: Diagnosis not present

## 2018-05-18 DIAGNOSIS — Z951 Presence of aortocoronary bypass graft: Secondary | ICD-10-CM | POA: Diagnosis not present

## 2018-05-18 DIAGNOSIS — T83098A Other mechanical complication of other indwelling urethral catheter, initial encounter: Secondary | ICD-10-CM | POA: Diagnosis not present

## 2018-05-18 DIAGNOSIS — E119 Type 2 diabetes mellitus without complications: Secondary | ICD-10-CM | POA: Diagnosis not present

## 2018-05-18 DIAGNOSIS — Z466 Encounter for fitting and adjustment of urinary device: Secondary | ICD-10-CM | POA: Diagnosis not present

## 2018-05-18 DIAGNOSIS — Z87891 Personal history of nicotine dependence: Secondary | ICD-10-CM | POA: Diagnosis not present

## 2018-05-18 DIAGNOSIS — K219 Gastro-esophageal reflux disease without esophagitis: Secondary | ICD-10-CM | POA: Diagnosis not present

## 2018-05-18 DIAGNOSIS — Z8546 Personal history of malignant neoplasm of prostate: Secondary | ICD-10-CM | POA: Diagnosis not present

## 2018-05-18 DIAGNOSIS — Z79899 Other long term (current) drug therapy: Secondary | ICD-10-CM | POA: Diagnosis not present

## 2018-05-18 NOTE — Telephone Encounter (Signed)
To [name] 

## 2018-05-18 NOTE — Telephone Encounter (Signed)
I have requested records from [place]

## 2018-05-19 NOTE — Telephone Encounter (Signed)
Reviewed records from the ED visit on 05/14/2018 at [facility] BUN 34, creatinine 2.49 (2.31 IN 03/2018), white blood cell count 7100, hemoglobin 11.2 low (10.5 IN 03/2018), hematocrit 34 low, MCV 98.6 high, platelets 141,000  CT abdomen pelvis without contrast   IMPRESSION:  1. New bilateral hydronephrosis, moderate to severe in degree on the left and moderate in degree on the right with bilateral hydroureter to the level of the bladder.  Findings could be related to intermittent bladder outlet obstruction or obstruction at the bilateral UVJs, favor the latter given the circumferential bladder wall thickening and the lack of bladder distention.  Left-sided nephroureteral stent appears grossly well-positioned. 2.  The circumferential bladder wall thickening is not significantly changed in appearance compared to the earlier CT of July 11, 2017. 3.  Subtle pericolonic inflammation adjacent to the sigmoid colon, suspicious for concomitant mild acute diverticulitis.  No pericolonic fluid collection.  No bowel obstruction.  Extensive diverticulosis of the sigmoid colon. 4.  Cirrhotic appearing liver. 5.  Additional chronic/incidental findings as detailed above.  Report to be scanned into epic.  Patient treated for acute prostatitis, possible diverticulitis with doxycycline 10-day supply.   PLEASE TOUCH BASE WITH PATIENT AND LET HIM KNOW I REVIEWED HIS ED RECORDS. PLEASE KEEP OV WITH AB AS PLANNED. HE NEEDS TO FOLLOW UP WITH HIS UROLOGIST REGARDING BILATERAL HYDRONEPHROSIS.

## 2018-05-19 NOTE — Telephone Encounter (Signed)
Pt notified and will keep his f/u.Marland Kitchen Pt is also scheduled with Nephrology next week.

## 2018-05-25 NOTE — Telephone Encounter (Signed)
Thank you, [name].

## 2018-05-27 ENCOUNTER — Ambulatory Visit (INDEPENDENT_AMBULATORY_CARE_PROVIDER_SITE_OTHER): Payer: Medicare Other | Admitting: Urology

## 2018-05-27 DIAGNOSIS — N401 Enlarged prostate with lower urinary tract symptoms: Secondary | ICD-10-CM

## 2018-05-27 DIAGNOSIS — N131 Hydronephrosis with ureteral stricture, not elsewhere classified: Secondary | ICD-10-CM | POA: Diagnosis not present

## 2018-05-28 DIAGNOSIS — D500 Iron deficiency anemia secondary to blood loss (chronic): Secondary | ICD-10-CM | POA: Diagnosis not present

## 2018-05-28 DIAGNOSIS — D649 Anemia, unspecified: Secondary | ICD-10-CM | POA: Diagnosis not present

## 2018-06-02 ENCOUNTER — Other Ambulatory Visit: Payer: Self-pay | Admitting: Urology

## 2018-06-04 ENCOUNTER — Encounter: Payer: Self-pay | Admitting: Family Medicine

## 2018-06-05 ENCOUNTER — Encounter: Payer: Self-pay | Admitting: Family Medicine

## 2018-06-09 ENCOUNTER — Ambulatory Visit: Payer: Medicare Other | Admitting: Gastroenterology

## 2018-06-11 DIAGNOSIS — I10 Essential (primary) hypertension: Secondary | ICD-10-CM | POA: Diagnosis not present

## 2018-06-11 DIAGNOSIS — E559 Vitamin D deficiency, unspecified: Secondary | ICD-10-CM | POA: Diagnosis not present

## 2018-06-11 DIAGNOSIS — Z79899 Other long term (current) drug therapy: Secondary | ICD-10-CM | POA: Diagnosis not present

## 2018-06-11 DIAGNOSIS — D509 Iron deficiency anemia, unspecified: Secondary | ICD-10-CM | POA: Diagnosis not present

## 2018-06-11 DIAGNOSIS — N183 Chronic kidney disease, stage 3 (moderate): Secondary | ICD-10-CM | POA: Diagnosis not present

## 2018-06-11 DIAGNOSIS — R809 Proteinuria, unspecified: Secondary | ICD-10-CM | POA: Diagnosis not present

## 2018-06-17 DIAGNOSIS — N184 Chronic kidney disease, stage 4 (severe): Secondary | ICD-10-CM | POA: Diagnosis not present

## 2018-06-17 DIAGNOSIS — R809 Proteinuria, unspecified: Secondary | ICD-10-CM | POA: Diagnosis not present

## 2018-06-17 DIAGNOSIS — D638 Anemia in other chronic diseases classified elsewhere: Secondary | ICD-10-CM | POA: Diagnosis not present

## 2018-06-17 DIAGNOSIS — E872 Acidosis: Secondary | ICD-10-CM | POA: Diagnosis not present

## 2018-07-08 DIAGNOSIS — Z87891 Personal history of nicotine dependence: Secondary | ICD-10-CM | POA: Diagnosis not present

## 2018-07-08 DIAGNOSIS — Z6824 Body mass index (BMI) 24.0-24.9, adult: Secondary | ICD-10-CM | POA: Diagnosis not present

## 2018-07-08 DIAGNOSIS — N183 Chronic kidney disease, stage 3 (moderate): Secondary | ICD-10-CM | POA: Diagnosis not present

## 2018-07-08 DIAGNOSIS — Z951 Presence of aortocoronary bypass graft: Secondary | ICD-10-CM | POA: Diagnosis not present

## 2018-07-08 DIAGNOSIS — N400 Enlarged prostate without lower urinary tract symptoms: Secondary | ICD-10-CM | POA: Diagnosis not present

## 2018-07-08 DIAGNOSIS — Z923 Personal history of irradiation: Secondary | ICD-10-CM | POA: Diagnosis not present

## 2018-07-08 DIAGNOSIS — Z880 Allergy status to penicillin: Secondary | ICD-10-CM | POA: Diagnosis not present

## 2018-07-08 DIAGNOSIS — H8110 Benign paroxysmal vertigo, unspecified ear: Secondary | ICD-10-CM | POA: Diagnosis not present

## 2018-07-08 DIAGNOSIS — E1122 Type 2 diabetes mellitus with diabetic chronic kidney disease: Secondary | ICD-10-CM | POA: Diagnosis not present

## 2018-07-08 DIAGNOSIS — M109 Gout, unspecified: Secondary | ICD-10-CM | POA: Diagnosis not present

## 2018-07-08 DIAGNOSIS — D500 Iron deficiency anemia secondary to blood loss (chronic): Secondary | ICD-10-CM | POA: Diagnosis not present

## 2018-07-08 DIAGNOSIS — I129 Hypertensive chronic kidney disease with stage 1 through stage 4 chronic kidney disease, or unspecified chronic kidney disease: Secondary | ICD-10-CM | POA: Diagnosis not present

## 2018-07-08 DIAGNOSIS — Z79899 Other long term (current) drug therapy: Secondary | ICD-10-CM | POA: Diagnosis not present

## 2018-07-08 DIAGNOSIS — I2510 Atherosclerotic heart disease of native coronary artery without angina pectoris: Secondary | ICD-10-CM | POA: Diagnosis not present

## 2018-07-08 DIAGNOSIS — M25472 Effusion, left ankle: Secondary | ICD-10-CM | POA: Diagnosis not present

## 2018-07-08 DIAGNOSIS — C61 Malignant neoplasm of prostate: Secondary | ICD-10-CM | POA: Diagnosis not present

## 2018-07-08 DIAGNOSIS — Z7982 Long term (current) use of aspirin: Secondary | ICD-10-CM | POA: Diagnosis not present

## 2018-07-08 DIAGNOSIS — E785 Hyperlipidemia, unspecified: Secondary | ICD-10-CM | POA: Diagnosis not present

## 2018-07-22 NOTE — Patient Instructions (Signed)
Andrew Zhang  07/22/2018     @PREFPERIOPPHARMACY @   Your procedure is scheduled on  07/29/2018 .  Report to [name] at  [facility]   [facility].  Call this number if you have problems the morning of surgery:  [phone]   Remember:  Do not eat or drink after midnight.  You may drink clear liquids until  12 midnight 07/28/2018  .  Clear liquids allowed are:                    Water, Juice (non-citric and without pulp), Carbonated beverages, Clear Tea, Black Coffee only, Plain Jell-O only, Gatorade and Plain Popsicles only    Take these medicines the morning of surgery with A SIP OF WATER  prilosec.    Do not wear jewelry, make-up or nail polish.  Do not wear lotions, powders, or perfumes, or deodorant.  Do not shave 48 hours prior to surgery.  Men may shave face and neck.  Do not bring valuables to the hospital.  [facility] is not responsible for any belongings or valuables.  Contacts, dentures or bridgework may not be worn into surgery.  Leave your suitcase in the car.  After surgery it may be brought to your room.  For patients admitted to the hospital, discharge time will be determined by your treatment team.  Patients discharged the day of surgery will not be allowed to drive home.   Name and phone number of your driver:   family Special instructions:  None  Please read over the following fact sheets that you were given. Anesthesia Post-op Instructions and Care and Recovery After Surgery       Cystoscopy Cystoscopy is a procedure that is used to help diagnose and sometimes treat conditions that affect that lower urinary tract. The lower urinary tract includes the bladder and the tube that drains urine from the bladder out of the body (urethra). Cystoscopy is performed with a thin, tube-shaped instrument with a light and camera at the end (cystoscope). The cystoscope may be hard (rigid) or flexible, depending on the goal of the procedure.The  cystoscope is inserted through the urethra, into the bladder. Cystoscopy may be recommended if you have:  Urinary tractinfections that keep coming back (recurring).  Blood in the urine (hematuria).  Loss of bladder control (urinary incontinence) or an overactive bladder.  Unusual cells found in a urine sample.  A blockage in the urethra.  Painful urination.  An abnormality in the bladder found during an intravenous pyelogram (IVP) or CT scan.  Cystoscopy may also be done to remove a sample of tissue to be examined under a microscope (biopsy). Tell a health care provider about:  Any allergies you have.  All medicines you are taking, including vitamins, herbs, eye drops, creams, and over-the-counter medicines.  Any problems you or family members have had with anesthetic medicines.  Any blood disorders you have.  Any surgeries you have had.  Any medical conditions you have.  Whether you are pregnant or may be pregnant. What are the risks? Generally, this is a safe procedure. However, problems may occur, including:  Infection.  Bleeding.  Allergic reactions to medicines.  Damage to other structures or organs.  What happens before the procedure?  Ask your health care provider about: ? Changing or stopping your regular medicines. This is especially important if you are taking diabetes medicines or blood thinners. ?  Taking medicines such as aspirin and ibuprofen. These medicines can thin your blood. Do not take these medicines before your procedure if your health care provider instructs you not to.  Follow instructions from your health care provider about eating or drinking restrictions.  You may be given antibiotic medicine to help prevent infection.  You may have an exam or testing, such as X-rays of the bladder, urethra, or kidneys.  You may have urine tests to check for signs of infection.  Plan to have someone take you home after the procedure. What happens  during the procedure?  To reduce your risk of infection,your health care team will wash or sanitize their hands.  You will be given one or more of the following: ? A medicine to help you relax (sedative). ? A medicine to numb the area (local anesthetic).  The area around the opening of your urethra will be cleaned.  The cystoscope will be passed through your urethra into your bladder.  Germ-free (sterile)fluid will flow through the cystoscope to fill your bladder. The fluid will stretch your bladder so that your surgeon can clearly examine your bladder walls.  The cystoscope will be removed and your bladder will be emptied. The procedure may vary among health care providers and hospitals. What happens after the procedure?  You may have some soreness or pain in your abdomen and urethra. Medicines will be available to help you.  You may have some blood in your urine.  Do not drive for 24 hours if you received a sedative. This information is not intended to replace advice given to you by your health care provider. Make sure you discuss any questions you have with your health care provider. Document Released: 12/13/2000 Document Revised: 04/25/2016 Document Reviewed: 11/02/2015 Elsevier Interactive Patient Education  2018 [employer].  Cystoscopy, Care After Refer to this sheet in the next few weeks. These instructions provide you with information about caring for yourself after your procedure. Your health care provider may also give you more specific instructions. Your treatment has been planned according to current medical practices, but problems sometimes occur. Call your health care provider if you have any problems or questions after your procedure. What can I expect after the procedure? After the procedure, it is common to have:  Mild pain when you urinate. Pain should stop within a few minutes after you urinate. This may last for up to 1 week.  A small amount of blood in your  urine for several days.  Feeling like you need to urinate but producing only a small amount of urine.  Follow these instructions at home:  Medicines  Take over-the-counter and prescription medicines only as told by your health care provider.  If you were prescribed an antibiotic medicine, take it as told by your health care provider. Do not stop taking the antibiotic even if you start to feel better. General instructions   Return to your normal activities as told by your health care provider. Ask your health care provider what activities are safe for you.  Do not drive for 24 hours if you received a sedative.  Watch for any blood in your urine. If the amount of blood in your urine increases, call your health care provider.  Follow instructions from your health care provider about eating or drinking restrictions.  If a tissue sample was removed for testing (biopsy) during your procedure, it is your responsibility to get your test results. Ask your health care provider or the department performing  the test when your results will be ready.  Drink enough fluid to keep your urine clear or pale yellow.  Keep all follow-up visits as told by your health care provider. This is important. Contact a health care provider if:  You have pain that gets worse or does not get better with medicine, especially pain when you urinate.  You have difficulty urinating. Get help right away if:  You have more blood in your urine.  You have blood clots in your urine.  You have abdominal pain.  You have a fever or chills.  You are unable to urinate. This information is not intended to replace advice given to you by your health care provider. Make sure you discuss any questions you have with your health care provider. Document Released: 07/05/2005 Document Revised: 05/23/2016 Document Reviewed: 11/02/2015 Elsevier Interactive Patient Education  2018 [employer].  Ureteral Stent  Implantation Ureteral stent implantation is a procedure to insert (implant) a flexible, soft, plastic tube (stent) into a tube (ureter) that drains urine from the kidneys. The stent supports the ureter while it heals and helps to drain urine from the kidneys. You may have a ureteral stent implanted after having a procedure to remove a blockage from the ureter (ureterolysis or pyeloplasty).You may also have a stent implanted to open the flow of urine when you have a blockage caused by a kidney stone, tumor, blood clot, or infection. You have two ureters, one on each side of the body. The ureters connect the kidneys to the organ that holds urine until it passes out of the body (bladder). The stent is placed so that one end is in the kidney, and one end is in the bladder. The stent is usually taken out after your ureter has healed. Depending on your condition, you may have a stent for just a few weeks, or you may have a long-term stent that will need to be replaced every few months. Tell a health care provider about:  Any allergies you have.  All medicines you are taking, including vitamins, herbs, eye drops, creams, and over-the-counter medicines.  Any problems you or family members have had with anesthetic medicines.  Any blood disorders you have.  Any surgeries you have had.  Any medical conditions you have.  Whether you are pregnant or may be pregnant. What are the risks? Generally, this is a safe procedure. However, problems may occur, including:  Infection.  Bleeding.  Allergic reactions to medicines.  Damage to other structures or organs. Tearing (perforation) of the ureter is possible.  Movement of the stent away from where it is placed during surgery (migration).  What happens before the procedure?  Ask your health care provider about: ? Changing or stopping your regular medicines. This is especially important if you are taking diabetes medicines or blood thinners. ? Taking  medicines such as aspirin and ibuprofen. These medicines can thin your blood. Do not take these medicines before your procedure if your health care provider instructs you not to.  Follow instructions from your health care provider about eating or drinking restrictions.  Do not drink alcohol and do not use any tobacco products before your procedure, as told by your health care provider.  You may be given antibiotic medicine to help prevent infection.  Plan to have someone take you home after the procedure.  If you go home right after the procedure, plan to have someone with you for 24 hours. What happens during the procedure?  An IV tube  will be inserted into one of your veins.  You will be given a medicine to make you fall asleep (general anesthetic). You may also be given a medicine to help you relax (sedative).  A thin, tube-shaped instrument with a light and tiny camera at the end (cystoscope) will be inserted into your urethra. The urethra is the tube that drains urine from the bladder out of the body. In men, the urethra opens at the end of the penis. In women, the urethra opens in front of the vaginal opening.  The cystoscope will be passed into your bladder.  A thin wire (guide wire) will be passed through your bladder and into your ureter. This is used to guide the stent into your ureter.  The stent will be inserted into your ureter.  The guide wire and the cystoscope will be removed.  A flexible tube (catheter) will be inserted through your urethra so that one end is in your bladder. This helps to drain urine from your bladder. The procedure may vary among hospitals and health care providers. What happens after the procedure?  Your blood pressure, heart rate, breathing rate, and blood oxygen level will be monitored often until the medicines you were given have worn off.  You may continue to receive medicine and fluids through an IV tube.  You may have some soreness or pain  in your abdomen and urethra. Medicines will be available to help you.  You will be encouraged to get up and walk around as soon as you can.  You will have a catheter draining your urine.  You will have some blood in your urine.  Do not drive for 24 hours if you received a sedative. This information is not intended to replace advice given to you by your health care provider. Make sure you discuss any questions you have with your health care provider. Document Released: 12/13/2000 Document Revised: 05/23/2016 Document Reviewed: 06/30/2015 Elsevier Interactive Patient Education  2018 [place].  Ureteral Stent Implantation, Care After Refer to this sheet in the next few weeks. These instructions provide you with information about caring for yourself after your procedure. Your health care provider may also give you more specific instructions. Your treatment has been planned according to current medical practices, but problems sometimes occur. Call your health care provider if you have any problems or questions after your procedure. What can I expect after the procedure? After the procedure, it is common to have:  Nausea.  Mild pain when you urinate. You may feel this pain in your lower back or lower abdomen. Pain should stop within a few minutes after you urinate. This may last for up to 1 week.  A small amount of blood in your urine for several days.  Follow these instructions at home:  Medicines  Take over-the-counter and prescription medicines only as told by your health care provider.  If you were prescribed an antibiotic medicine, take it as told by your health care provider. Do not stop taking the antibiotic even if you start to feel better.  Do not drive for 24 hours if you received a sedative.  Do not drive or operate heavy machinery while taking prescription pain medicines. Activity  Return to your normal activities as told by your health care provider. Ask your health  care provider what activities are safe for you.  Do not lift anything that is heavier than 10 lb (4.5 kg). Follow this limit for 1 week after your procedure, or for as  long as told by your health care provider. General instructions  Watch for any blood in your urine. Call your health care provider if the amount of blood in your urine increases.  If you have a catheter: ? Follow instructions from your health care provider about taking care of your catheter and collection bag. ? Do not take baths, swim, or use a hot tub until your health care provider approves.  Drink enough fluid to keep your urine clear or pale yellow.  Keep all follow-up visits as told by your health care provider. This is important. Contact a health care provider if:  You have pain that gets worse or does not get better with medicine, especially pain when you urinate.  You have difficulty urinating.  You feel nauseous or you vomit repeatedly during a period of more than 2 days after the procedure. Get help right away if:  Your urine is dark red or has blood clots in it.  You are leaking urine (have incontinence).  The end of the stent comes out of your urethra.  You cannot urinate.  You have sudden, sharp, or severe pain in your abdomen or lower back.  You have a fever. This information is not intended to replace advice given to you by your health care provider. Make sure you discuss any questions you have with your health care provider. Document Released: 08/18/2013 Document Revised: 05/23/2016 Document Reviewed: 06/30/2015 Elsevier Interactive Patient Education  2018 [place] Anesthesia, Adult General anesthesia is the use of medicines to make a person "go to sleep" (be unconscious) for a medical procedure. General anesthesia is often recommended when a procedure:  Is long.  Requires you to be still or in an unusual position.  Is major and can cause you to lose blood.  Is impossible  to do without general anesthesia.  The medicines used for general anesthesia are called general anesthetics. In addition to making you sleep, the medicines:  Prevent pain.  Control your blood pressure.  Relax your muscles.  Tell a health care provider about:  Any allergies you have.  All medicines you are taking, including vitamins, herbs, eye drops, creams, and over-the-counter medicines.  Any problems you or family members have had with anesthetic medicines.  Types of anesthetics you have had in the past.  Any bleeding disorders you have.  Any surgeries you have had.  Any medical conditions you have.  Any history of heart or lung conditions, such as heart failure, sleep apnea, or chronic obstructive pulmonary disease (COPD).  Whether you are pregnant or may be pregnant.  Whether you use tobacco, alcohol, marijuana, or street drugs.  Any history of Armed forces logistics/support/administrative officer.  Any history of depression or anxiety. What are the risks? Generally, this is a safe procedure. However, problems may occur, including:  Allergic reaction to anesthetics.  Lung and heart problems.  Inhaling food or liquids from your stomach into your lungs (aspiration).  Injury to nerves.  Waking up during your procedure and being unable to move (rare).  Extreme agitation or a state of mental confusion (delirium) when you wake up from the anesthetic.  Air in the bloodstream, which can lead to stroke.  These problems are more likely to develop if you are having a major surgery or if you have an advanced medical condition. You can prevent some of these complications by answering all of your health care provider's questions thoroughly and by following all pre-procedure instructions. General anesthesia can cause side effects, including:  Nausea or vomiting  A sore throat from the breathing tube.  Feeling cold or shivery.  Feeling tired, washed out, or achy.  Sleepiness or  drowsiness.  Confusion or agitation.  What happens before the procedure? Staying hydrated Follow instructions from your health care provider about hydration, which may include:  Up to 2 hours before the procedure - you may continue to drink clear liquids, such as water, clear fruit juice, black coffee, and plain tea.  Eating and drinking restrictions Follow instructions from your health care provider about eating and drinking, which may include:  8 hours before the procedure - stop eating heavy meals or foods such as meat, fried foods, or fatty foods.  6 hours before the procedure - stop eating light meals or foods, such as toast or cereal.  6 hours before the procedure - stop drinking milk or drinks that contain milk.  2 hours before the procedure - stop drinking clear liquids.  Medicines  Ask your health care provider about: ? Changing or stopping your regular medicines. This is especially important if you are taking diabetes medicines or blood thinners. ? Taking medicines such as aspirin and ibuprofen. These medicines can thin your blood. Do not take these medicines before your procedure if your health care provider instructs you not to. ? Taking new dietary supplements or medicines. Do not take these during the week before your procedure unless your health care provider approves them.  If you are told to take a medicine or to continue taking a medicine on the day of the procedure, take the medicine with sips of water. General instructions   Ask if you will be going home the same day, the following day, or after a longer hospital stay. ? Plan to have someone take you home. ? Plan to have someone stay with you for the first 24 hours after you leave the hospital or clinic.  For 3-6 weeks before the procedure, try not to use any tobacco products, such as cigarettes, chewing tobacco, and e-cigarettes.  You may brush your teeth on the morning of the procedure, but make sure to  spit out the toothpaste. What happens during the procedure?  You will be given anesthetics through a mask and through an IV tube in one of your veins.  You may receive medicine to help you relax (sedative).  As soon as you are asleep, a breathing tube may be used to help you breathe.  An anesthesia specialist will stay with you throughout the procedure. He or she will help keep you comfortable and safe by continuing to give you medicines and adjusting the amount of medicine that you get. He or she will also watch your blood pressure, pulse, and oxygen levels to make sure that the anesthetics do not cause any problems.  If a breathing tube was used to help you breathe, it will be removed before you wake up. The procedure may vary among health care providers and hospitals. What happens after the procedure?  You will wake up, often slowly, after the procedure is complete, usually in a recovery area.  Your blood pressure, heart rate, breathing rate, and blood oxygen level will be monitored until the medicines you were given have worn off.  You may be given medicine to help you calm down if you feel anxious or agitated.  If you will be going home the same day, your health care provider may check to make sure you can stand, drink, and urinate.  Your health care  providers will treat your pain and side effects before you go home.  Do not drive for 24 hours if you received a sedative.  You may: ? Feel nauseous and vomit. ? Have a sore throat. ? Have mental slowness. ? Feel cold or shivery. ? Feel sleepy. ? Feel tired. ? Feel sore or achy, even in parts of your body where you did not have surgery. This information is not intended to replace advice given to you by your health care provider. Make sure you discuss any questions you have with your health care provider. Document Released: 03/24/2008 Document Revised: 05/28/2016 Document Reviewed: 11/30/2015 Elsevier Interactive Patient  Education  2018 [place] Anesthesia, Adult, Care After These instructions provide you with information about caring for yourself after your procedure. Your health care provider may also give you more specific instructions. Your treatment has been planned according to current medical practices, but problems sometimes occur. Call your health care provider if you have any problems or questions after your procedure. What can I expect after the procedure? After the procedure, it is common to have:  Vomiting.  A sore throat.  Mental slowness.  It is common to feel:  Nauseous.  Cold or shivery.  Sleepy.  Tired.  Sore or achy, even in parts of your body where you did not have surgery.  Follow these instructions at home: For at least 24 hours after the procedure:  Do not: ? Participate in activities where you could fall or become injured. ? Drive. ? Use heavy machinery. ? Drink alcohol. ? Take sleeping pills or medicines that cause drowsiness. ? Make important decisions or sign legal documents. ? Take care of children on your own.  Rest. Eating and drinking  If you vomit, drink water, juice, or soup when you can drink without vomiting.  Drink enough fluid to keep your urine clear or pale yellow.  Make sure you have little or no nausea before eating solid foods.  Follow the diet recommended by your health care provider. General instructions  Have a responsible adult stay with you until you are awake and alert.  Return to your normal activities as told by your health care provider. Ask your health care provider what activities are safe for you.  Take over-the-counter and prescription medicines only as told by your health care provider.  If you smoke, do not smoke without supervision.  Keep all follow-up visits as told by your health care provider. This is important. Contact a health care provider if:  You continue to have nausea or vomiting at home, and  medicines are not helpful.  You cannot drink fluids or start eating again.  You cannot urinate after 8-12 hours.  You develop a skin rash.  You have fever.  You have increasing redness at the site of your procedure. Get help right away if:  You have difficulty breathing.  You have chest pain.  You have unexpected bleeding.  You feel that you are having a life-threatening or urgent problem. This information is not intended to replace advice given to you by your health care provider. Make sure you discuss any questions you have with your health care provider. Document Released: 03/24/2001 Document Revised: 05/20/2016 Document Reviewed: 11/30/2015 Elsevier Interactive Patient Education  [employer].

## 2018-07-24 ENCOUNTER — Encounter (HOSPITAL_COMMUNITY): Payer: Self-pay

## 2018-07-24 ENCOUNTER — Other Ambulatory Visit: Payer: Self-pay

## 2018-07-24 ENCOUNTER — Encounter (HOSPITAL_COMMUNITY)
Admission: RE | Admit: 2018-07-24 | Discharge: 2018-07-24 | Disposition: A | Discharge status: Home / Self Care | Payer: Medicare Other | Source: Ambulatory Visit | Attending: Urology | Admitting: Urology

## 2018-07-24 DIAGNOSIS — Z01812 Encounter for preprocedural laboratory examination: Secondary | ICD-10-CM | POA: Insufficient documentation

## 2018-07-24 LAB — HEMOGLOBIN A1C
Hgb A1c MFr Bld: 5.3 % (ref 4.8–5.6)
MEAN PLASMA GLUCOSE: 105.41 mg/dL

## 2018-07-24 LAB — BASIC METABOLIC PANEL
Anion gap: 8 (ref 5–15)
BUN: 39 mg/dL — ABNORMAL HIGH (ref 8–23)
CHLORIDE: 109 mmol/L (ref 98–111)
CO2: 25 mmol/L (ref 22–32)
CREATININE: 2.6 mg/dL — AB (ref 0.61–1.24)
Calcium: 8.9 mg/dL (ref 8.9–10.3)
GFR calc non Af Amer: 21 mL/min — ABNORMAL LOW (ref >60)
GFR, EST AFRICAN AMERICAN: 25 mL/min — AB (ref >60)
GLUCOSE: 105 mg/dL — AB (ref 70–99)
Potassium: 4.2 mmol/L (ref 3.5–5.1)
Sodium: 142 mmol/L (ref 135–145)

## 2018-07-24 LAB — CBC
HEMATOCRIT: 28.6 % — AB (ref 39.0–52.0)
Hemoglobin: 9.2 g/dL — ABNORMAL LOW (ref 13.0–17.0)
MCH: 31.6 pg (ref 26.0–34.0)
MCHC: 32.2 g/dL (ref 30.0–36.0)
MCV: 98.3 fL (ref 78.0–100.0)
Platelets: 151 10*3/uL (ref 150–400)
RBC: 2.91 MIL/uL — ABNORMAL LOW (ref 4.22–5.81)
RDW: 14.5 % (ref 11.5–15.5)
WBC: 4.8 10*3/uL (ref 4.0–10.5)

## 2018-07-24 LAB — GLUCOSE, CAPILLARY: Glucose-Capillary: 97 mg/dL (ref 70–99)

## 2018-07-29 ENCOUNTER — Ambulatory Visit (HOSPITAL_COMMUNITY): Payer: Medicare Other

## 2018-07-29 ENCOUNTER — Ambulatory Visit (HOSPITAL_COMMUNITY): Payer: Medicare Other | Admitting: Anesthesiology

## 2018-07-29 ENCOUNTER — Encounter (HOSPITAL_COMMUNITY): Payer: Self-pay | Admitting: *Deleted

## 2018-07-29 ENCOUNTER — Ambulatory Visit (HOSPITAL_COMMUNITY)
Admission: RE | Admit: 2018-07-29 | Discharge: 2018-07-29 | Disposition: A | Discharge status: Home / Self Care | Payer: Medicare Other | Source: Ambulatory Visit | Attending: Urology | Admitting: Urology

## 2018-07-29 ENCOUNTER — Encounter (HOSPITAL_COMMUNITY)
Admission: RE | Disposition: A | Discharge status: Home / Self Care | Payer: Self-pay | Source: Ambulatory Visit | Attending: Urology

## 2018-07-29 ENCOUNTER — Other Ambulatory Visit: Payer: Self-pay

## 2018-07-29 DIAGNOSIS — I714 Abdominal aortic aneurysm, without rupture: Secondary | ICD-10-CM | POA: Diagnosis not present

## 2018-07-29 DIAGNOSIS — Z87442 Personal history of urinary calculi: Secondary | ICD-10-CM | POA: Diagnosis not present

## 2018-07-29 DIAGNOSIS — Z8546 Personal history of malignant neoplasm of prostate: Secondary | ICD-10-CM | POA: Insufficient documentation

## 2018-07-29 DIAGNOSIS — Z8719 Personal history of other diseases of the digestive system: Secondary | ICD-10-CM | POA: Diagnosis not present

## 2018-07-29 DIAGNOSIS — N131 Hydronephrosis with ureteral stricture, not elsewhere classified: Secondary | ICD-10-CM | POA: Diagnosis not present

## 2018-07-29 DIAGNOSIS — E119 Type 2 diabetes mellitus without complications: Secondary | ICD-10-CM | POA: Insufficient documentation

## 2018-07-29 DIAGNOSIS — F329 Major depressive disorder, single episode, unspecified: Secondary | ICD-10-CM | POA: Insufficient documentation

## 2018-07-29 DIAGNOSIS — Z7984 Long term (current) use of oral hypoglycemic drugs: Secondary | ICD-10-CM | POA: Diagnosis not present

## 2018-07-29 DIAGNOSIS — N135 Crossing vessel and stricture of ureter without hydronephrosis: Secondary | ICD-10-CM | POA: Insufficient documentation

## 2018-07-29 DIAGNOSIS — I10 Essential (primary) hypertension: Secondary | ICD-10-CM | POA: Diagnosis not present

## 2018-07-29 DIAGNOSIS — Z87891 Personal history of nicotine dependence: Secondary | ICD-10-CM | POA: Diagnosis not present

## 2018-07-29 DIAGNOSIS — G4730 Sleep apnea, unspecified: Secondary | ICD-10-CM | POA: Diagnosis not present

## 2018-07-29 DIAGNOSIS — Z880 Allergy status to penicillin: Secondary | ICD-10-CM | POA: Diagnosis not present

## 2018-07-29 DIAGNOSIS — K219 Gastro-esophageal reflux disease without esophagitis: Secondary | ICD-10-CM | POA: Diagnosis not present

## 2018-07-29 DIAGNOSIS — Z951 Presence of aortocoronary bypass graft: Secondary | ICD-10-CM | POA: Insufficient documentation

## 2018-07-29 DIAGNOSIS — Z79899 Other long term (current) drug therapy: Secondary | ICD-10-CM | POA: Diagnosis not present

## 2018-07-29 DIAGNOSIS — N1330 Unspecified hydronephrosis: Secondary | ICD-10-CM | POA: Diagnosis not present

## 2018-07-29 DIAGNOSIS — E785 Hyperlipidemia, unspecified: Secondary | ICD-10-CM | POA: Insufficient documentation

## 2018-07-29 DIAGNOSIS — I2510 Atherosclerotic heart disease of native coronary artery without angina pectoris: Secondary | ICD-10-CM | POA: Insufficient documentation

## 2018-07-29 DIAGNOSIS — E538 Deficiency of other specified B group vitamins: Secondary | ICD-10-CM | POA: Insufficient documentation

## 2018-07-29 HISTORY — PX: CYSTOSCOPY W/ URETERAL STENT PLACEMENT: SHX1429

## 2018-07-29 LAB — GLUCOSE, CAPILLARY
GLUCOSE-CAPILLARY: 85 mg/dL (ref 70–99)
Glucose-Capillary: 79 mg/dL (ref 70–99)

## 2018-07-29 SURGERY — CYSTOSCOPY, WITH RETROGRADE PYELOGRAM AND URETERAL STENT INSERTION
Anesthesia: General | Laterality: Left

## 2018-07-29 MED ORDER — DIATRIZOATE MEGLUMINE 30 % UR SOLN
URETHRAL | Status: DC | PRN
  Administered 2018-07-29: 50 mL via URETHRAL

## 2018-07-29 MED ORDER — CEFAZOLIN SODIUM-DEXTROSE 2-4 GM/100ML-% IV SOLN
INTRAVENOUS | Status: AC
  Filled 2018-07-29: qty 100

## 2018-07-29 MED ORDER — SEVOFLURANE IN SOLN
RESPIRATORY_TRACT | Status: AC
  Filled 2018-07-29: qty 250

## 2018-07-29 MED ORDER — SODIUM CHLORIDE 0.9 % IR SOLN
Status: DC | PRN
  Administered 2018-07-29: 3000 mL via INTRAVESICAL
  Administered 2018-07-29: 1000 mL via INTRAVESICAL

## 2018-07-29 MED ORDER — CEFAZOLIN SODIUM-DEXTROSE 2-4 GM/100ML-% IV SOLN
2.0000 g | INTRAVENOUS | Status: AC
  Administered 2018-07-29: 2 g via INTRAVENOUS

## 2018-07-29 MED ORDER — FENTANYL CITRATE (PF) 100 MCG/2ML IJ SOLN
INTRAMUSCULAR | Status: AC
  Filled 2018-07-29: qty 2

## 2018-07-29 MED ORDER — ONDANSETRON HCL 4 MG/2ML IJ SOLN
INTRAMUSCULAR | Status: DC | PRN
  Administered 2018-07-29: 4 mg via INTRAVENOUS

## 2018-07-29 MED ORDER — LIDOCAINE HCL 1 % IJ SOLN
INTRAMUSCULAR | Status: DC | PRN
  Administered 2018-07-29: 25 mg via INTRADERMAL

## 2018-07-29 MED ORDER — DIATRIZOATE MEGLUMINE 30 % UR SOLN
URETHRAL | Status: AC
  Filled 2018-07-29: qty 100

## 2018-07-29 MED ORDER — LACTATED RINGERS IV SOLN: INTRAVENOUS | Status: DC

## 2018-07-29 MED ORDER — FENTANYL CITRATE (PF) 100 MCG/2ML IJ SOLN
INTRAMUSCULAR | Status: DC | PRN
  Administered 2018-07-29: 25 ug via INTRAVENOUS

## 2018-07-29 MED ORDER — HYDROCODONE-ACETAMINOPHEN 7.5-325 MG PO TABS: 1.0000 | ORAL_TABLET | Freq: Once | ORAL | Status: DC | PRN

## 2018-07-29 MED ORDER — LACTATED RINGERS IV SOLN
INTRAVENOUS | Status: DC
  Administered 2018-07-29: 11:00:00 via INTRAVENOUS

## 2018-07-29 MED ORDER — MIDAZOLAM HCL 5 MG/5ML IJ SOLN
INTRAMUSCULAR | Status: DC | PRN
  Administered 2018-07-29: 2 mg via INTRAVENOUS

## 2018-07-29 MED ORDER — PROMETHAZINE HCL 25 MG/ML IJ SOLN: 6.2500 mg | INTRAMUSCULAR | Status: DC | PRN

## 2018-07-29 MED ORDER — MEPERIDINE HCL 50 MG/ML IJ SOLN: 6.2500 mg | INTRAMUSCULAR | Status: DC | PRN

## 2018-07-29 MED ORDER — HYDROMORPHONE HCL 1 MG/ML IJ SOLN: 0.2500 mg | INTRAMUSCULAR | Status: DC | PRN

## 2018-07-29 MED ORDER — MIDAZOLAM HCL 2 MG/2ML IJ SOLN
INTRAMUSCULAR | Status: AC
  Filled 2018-07-29: qty 2

## 2018-07-29 MED ORDER — PROPOFOL 10 MG/ML IV BOLUS
INTRAVENOUS | Status: DC | PRN
  Administered 2018-07-29: 150 mg via INTRAVENOUS

## 2018-07-29 SURGICAL SUPPLY — 18 items
BAG DRAIN URO TABLE W/ADPT NS (BAG) ×3 IMPLANT
CATH INTERMIT  6FR 70CM (CATHETERS) ×3 IMPLANT
CLOTH BEACON ORANGE TIMEOUT ST (SAFETY) ×3 IMPLANT
DECANTER SPIKE VIAL GLASS SM (MISCELLANEOUS) ×3 IMPLANT
GLOVE BIO SURGEON STRL SZ8 (GLOVE) ×3 IMPLANT
GOWN STRL REUS W/ TWL XL LVL3 (GOWN DISPOSABLE) ×1 IMPLANT
GOWN STRL REUS W/TWL LRG LVL3 (GOWN DISPOSABLE) ×3 IMPLANT
GOWN STRL REUS W/TWL XL LVL3 (GOWN DISPOSABLE) ×2
GUIDEWIRE STR ZIPWIRE 035X150 (MISCELLANEOUS) ×3 IMPLANT
IV NS IRRIG 3000ML ARTHROMATIC (IV SOLUTION) ×3 IMPLANT
KIT TURNOVER CYSTO (KITS) ×3 IMPLANT
MANIFOLD NEPTUNE II (INSTRUMENTS) ×3 IMPLANT
NS IRRIG 1000ML POUR BTL (IV SOLUTION) ×3 IMPLANT
PACK CYSTO (CUSTOM PROCEDURE TRAY) ×3 IMPLANT
PAD ARMBOARD 7.5X6 YLW CONV (MISCELLANEOUS) ×3 IMPLANT
STENT CONTOUR 7FRX24 (STENTS) ×3 IMPLANT
SYR 10ML LL (SYRINGE) ×3 IMPLANT
TOWEL OR 17X26 4PK STRL BLUE (TOWEL DISPOSABLE) ×3 IMPLANT

## 2018-07-29 NOTE — Anesthesia Postprocedure Evaluation (Signed)
Anesthesia Post Note  Patient: Andrew Zhang  Procedure(s) Performed: CYSTOSCOPY WITH RETROGRADE PYELOGRAM/URETERAL STENT EXCHANGE (Left )  Patient location during evaluation: PACU Anesthesia Type: General Level of consciousness: awake and patient cooperative Pain management: pain level controlled Vital Signs Assessment: post-procedure vital signs reviewed and stable Respiratory status: spontaneous breathing, nonlabored ventilation and respiratory function stable Cardiovascular status: blood pressure returned to baseline Postop Assessment: no apparent nausea or vomiting Anesthetic complications: no     Last Vitals:  Vitals:   07/29/18 1120 07/29/18 1135  BP: (!) 148/85 (!) 151/81  Pulse:    Resp:    Temp:    SpO2: 95% 97%    Last Pain:  Vitals:   07/29/18 1022  TempSrc: Oral  PainSc: 0-No pain                 Damareon Lanni J

## 2018-07-29 NOTE — Op Note (Signed)
Preoperative diagnosis:left ureteral stricture  Postoperative diagnosis:same  Procedure: 1 cystoscopy 2. Left retrograde pyelography 3. Intraoperative fluoroscopy, under one hour, with interpretation 4. Left 7x 24 JJ stent exchange  Attending: Rosie Fate  Anesthesia: General  Estimated blood loss: None  Drains: Left 7x 24 JJ ureteral stent without tether  Specimens:  none  Antibiotics: ancef  Findings:moderatelefthydronephrosis.Stent not encrusted.No masses/lesions in the bladder. Ureteral orifices in normal anatomic location.  Indications: Patient is a 84 year old male with a history a left mid ureteral stricture. After discussing treatment options, he decided proceed with leftstent exchange.  Procedure in detail: The patient was brought to the operating room and a brief timeout was done to ensure correct patient, correct procedure, correct site. General anesthesia was administered patient was placed in dorsal lithotomy position. His genitalia was then prepped and draped in usual sterile fashion. A rigid 40 French cystoscope was passed in the urethra and the bladder. Bladder was inspected free masses or lesions. the ureteral orifices were in the normal orthotopic locations. a 6 french ureteral catheter was then instilled into the left ureteral orifice. a gentle retrograde was obtained and findings noted above. We then used a grasper to bring the ureteral stent to the urethral meatus.we then placed asensorwire through the ureteral stentand advanced up to the renal pelvis. We then removed the stentwe then placed a 7x 24 double-j ureteral stent over the original sensorwire. We then removed the wire and good coil was noted in the the renal pelvis under fluoroscopy and the bladder under direct vision. the bladder was then drained and this concluded the procedure which was well tolerated by patient.  Complications: None  Condition: Stable,  extubated, transferred to PACU  Plan: Patient is to be discharged home as to follow-up in 4 weeks with a renal US.

## 2018-07-29 NOTE — Transfer of Care (Signed)
Immediate Anesthesia Transfer of Care Note  Patient: Andrew Zhang  Procedure(s) Performed: CYSTOSCOPY WITH RETROGRADE PYELOGRAM/URETERAL STENT EXCHANGE (Left )  Patient Location: PACU  Anesthesia Type:General  Level of Consciousness: awake and patient cooperative  Airway & Oxygen Therapy: Patient Spontanous Breathing and Patient connected to nasal cannula oxygen  Post-op Assessment: Report given to RN, Post -op Vital signs reviewed and stable and Patient moving all extremities  Post vital signs: Reviewed and stable  Last Vitals:  Vitals Value Taken Time  BP    Temp    Pulse    Resp    SpO2      Last Pain:  Vitals:   07/29/18 1022  TempSrc: Oral  PainSc: 0-No pain      Patients Stated Pain Goal: 6 (69/48/54 6270)  Complications: No apparent anesthesia complications

## 2018-07-29 NOTE — Discharge Instructions (Signed)

## 2018-07-29 NOTE — Anesthesia Procedure Notes (Signed)
Procedure Name: LMA Insertion Date/Time: 07/29/2018 12:42 PM Performed by: Charmaine Downs, CRNA Pre-anesthesia Checklist: Patient identified, Patient being monitored, Emergency Drugs available, Timeout performed and Suction available Patient Re-evaluated:Patient Re-evaluated prior to induction Oxygen Delivery Method: Circle System Utilized Preoxygenation: Pre-oxygenation with 100% oxygen Induction Type: IV induction Ventilation: Mask ventilation without difficulty LMA: LMA inserted LMA Size: 5.0 Number of attempts: 1 Placement Confirmation: positive ETCO2 and breath sounds checked- equal and bilateral Tube secured with: Tape Dental Injury: Teeth and Oropharynx as per pre-operative assessment

## 2018-07-29 NOTE — H&P (Signed)
Urology Admission H&P  Chief Complaint: left ureteral stricture  History of Present Illness: Mr Hennick is a 84yo with a hx of left ureteral stricture managed with an indwellign ureteral stent. He denies any LUTS. No fevers  Past Medical History:  Diagnosis Date  . AAA (abdominal aortic aneurysm) ([place])   . Anemia   . Arthritis   . B12 deficiency   . Blood transfusion without reported diagnosis   . CAD (coronary artery disease)   . Cancer [facility])    radiation for prostate cancer and lupron.  . Cataract   . Complication of anesthesia    sts,"after hearat surgery my stomach didnt wake up like it was supossed to".  . Depression   . Diverticulitis   . DMII (diabetes mellitus, type 2) ([place]) 2008  . GERD (gastroesophageal reflux disease)   . GI bleed   . GIB (gastrointestinal bleeding) 09/18/2016  . Hiatal hernia   . History of kidney stones   . HTN (hypertension)   . Hyperlipidemia   . Iron deficiency anemia due to chronic blood loss 01/08/2017  . Kidney stone   . PONV (postoperative nausea and vomiting)   . Prostate cancer ([place])   . Sleep apnea    cannot tolerate, PCP aware  . Ureteral stricture, left    Past Surgical History:  Procedure Laterality Date  . APPENDECTOMY    . BACK SURGERY     x2  . CATARACT EXTRACTION Left   . CHOLECYSTECTOMY    . COLONOSCOPY  03/2015   Dr. Britta Mccreedy: Diverticulosis, single tubular adenoma removed.  . COLONOSCOPY WITH PROPOFOL N/A 02/25/2017   Dr. Oneida Alar: non-thrombosed external hemorrhoids, significant looping of left colon. severe diverticulosis in recto-sigmoid colon and sigmoid colon. 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 CORONARY ARTERY BYPASS GRAFT     1995  . CYSTOSCOPY     with stent-left kidney. x2  . CYSTOSCOPY W/ URETERAL STENT PLACEMENT Left 05/14/2017   Procedure: CYSTOSCOPY WITH RETROGRADE PYELOGRAM/URETERAL STENT EXCHANGE;   Surgeon: Cleon Gustin, MD;  Location: [facility];  Service: Urology;  Laterality: Left;  . CYSTOSCOPY W/ URETERAL STENT PLACEMENT Left 10/13/2017   Procedure: CYSTOSCOPY WITH LEFT RETROGRADE PYELOGRAM/LEFT URETERAL STENT REPLACEMENT;  Surgeon: Cleon Gustin, MD;  Location: [facility];  Service: Urology;  Laterality: Left;  . CYSTOSCOPY W/ URETERAL STENT PLACEMENT Left 01/07/2018   Procedure: CYSTOSCOPY WITH RETROGRADE PYELOGRAM/URETERAL STENT PLACEMENT;  Surgeon: Cleon Gustin, MD;  Location: [facility];  Service: Urology;  Laterality: Left;  30 MINS STENT "REPLACEMENT" [phone] [id number] [id number]  . CYSTOSCOPY W/ URETERAL STENT PLACEMENT Left 04/22/2018   Procedure: CYSTOSCOPY WITH LEFT RETROGRADE PYELOGRAM/LEFT URETERAL STENT PLACEMENT, STENT EXCHANGE;  Surgeon: Cleon Gustin, MD;  Location: [facility];  Service: Urology;  Laterality: Left;  . CYSTOSCOPY WITH FULGERATION  08/13/2017   Procedure: CYSTOSCOPY WITH FULGERATION OF BLADDER;  Surgeon: Cleon Gustin, MD;  Location: [facility];  Service: Urology;;  . Consuela Mimes WITH RETROGRADE PYELOGRAM, URETEROSCOPY AND STENT PLACEMENT Left 08/13/2017   Procedure: CYSTOSCOPY WITH LEFT RETROGRADE PYELOGRAM, LEFT URETEROSCOPY AND STENT REPLACEMENT;  Surgeon: Cleon Gustin, MD;  Location: [facility];  Service: Urology;  Laterality: Left;  . DESCENDING AORTIC ANEURYSM REPAIR W/ STENT     x2  . ESOPHAGOGASTRODUODENOSCOPY N/A 07/30/2016   Dr. Oneida Alar. Nonobstructing Schatzki ring at GE junction, multiple small sessile polyps with no bleeding or stigmata of recent bleeding in the gastric fundus and gastric  body. Benign-appearing intrinsic moderate stenosis found the pylorus status post dilation. Small bowel capsule deployed.  Marland Kitchen GIVENS CAPSULE STUDY     Capsule study is complete to the cecum. No obvious lesions, mass, tumors. Small wisps of blood noted in small bowel secondary to EGD/dilation. Possible few erosions in setting of  NSAIDs. No obvious areas of bleeding.  Marland Kitchen GIVENS CAPSULE STUDY N/A 11/29/2017   Dr. Laural Golden: no evidence of active bleeding in stomach or small bowel. Multiple gastric petechia, single small AVM in proximal small bowel, scattered petechia involving small bowel mucosa.   Marland Kitchen HERNIA REPAIR    . HOLMIUM LASER APPLICATION Left 3/32/9518   Procedure: HOLMIUM LASER LITHOTRIPSY OF ENCRUSTED LEFT URETERAL STENT;  Surgeon: Cleon Gustin, MD;  Location: [facility];  Service: Urology;  Laterality: Left;  . SPINE SURGERY     ruptured discs    Home Medications:  Current Facility-Administered Medications  Medication Dose Route Frequency Provider Last Rate Last Dose  . ceFAZolin (ANCEF) IVPB 2g/100 mL premix  2 g Intravenous 30 min Pre-Op Cleon Gustin, MD      . lactated ringers infusion   Intravenous Continuous Vena Rua, MD 50 mL/hr at 07/29/18 1127     Allergies:  Allergies  Allergen Reactions  . Penicillins Rash and Other (See Comments)    Happened in [phone] Has patient had a PCN reaction causing immediate rash, facial/tongue/throat swelling, SOB or lightheaddness with hypotension: Yes - pt states he "just broke out a little but" after taking Penicillin Has patient had a PCN reaction causing severe rash involving mucus membranes or skin necrosis: Unknown Has patient had a PCN reaction that required hospitalization: No Has patient had a PCN reaction occurring within the last 10 years: No     Family History  Problem Relation Age of Onset  . Heart disease Unknown   . Diabetes Unknown   . Hypertension Unknown   . Liver cancer Mother   . Arthritis Mother   . Cancer Mother        died at 49  . Heart disease Mother   . Alzheimer's disease Father   . Cancer Brother        lung  . Cancer Son 41       lung  . Hepatitis Brother   . Alcohol abuse Brother   . Heart disease Son 108       heart attack, stents  . Diabetes Son   . Colon cancer Neg Hx    Social History:   reports that he quit smoking about 24 years ago. His smoking use included cigarettes. He started smoking about 67 years ago. He has a 135.00 pack-year smoking history. He has never used smokeless tobacco. He reports that he drinks alcohol. He reports that he does not use drugs.  Review of Systems  All other systems reviewed and are negative.   Physical Exam:  Vital signs in last 24 hours: Temp:  [97.7 F (36.5 C)] 97.7 F (36.5 C) (07/31 1022) Pulse Rate:  [73] 73 (07/31 1022) Resp:  [18] 18 (07/31 1022) BP: (145-151)/(66-85) 151/81 (07/31 1135) SpO2:  [94 %-98 %] 97 % (07/31 1135) Weight:  [84.8 kg (187 lb)] 84.8 kg (187 lb) (07/31 1022) Physical Exam  Constitutional: He is oriented to person, place, and time. He appears well-developed and well-nourished.  HENT:  Head: Normocephalic and atraumatic.  Eyes: Pupils are equal, round, and reactive to light. EOM are normal.  Neck: Normal range of motion. No  thyromegaly present.  Cardiovascular: Normal rate and regular rhythm.  Respiratory: Effort normal. No respiratory distress.  GI: Soft. He exhibits no distension.  Musculoskeletal: Normal range of motion. He exhibits no edema.  Neurological: He is alert and oriented to person, place, and time.  Skin: Skin is warm and dry.  Psychiatric: He has a normal mood and affect. His behavior is normal. Judgment and thought content normal.    Laboratory Data:  Results for orders placed or performed during the hospital encounter of 07/29/18 (from the past 24 hour(s))  Glucose, capillary     Status: None   Collection Time: 07/29/18 10:21 AM  Result Value Ref Range   Glucose-Capillary 85 70 - 99 mg/dL   No results found for this or any previous visit (from the past 240 hour(s)). Creatinine: Recent Labs    07/24/18 1002  CREATININE 2.60*   Baseline Creatinine: 2.0  Impression/Assessment:  84yo with a left ureteral stricture  Plan:  The risks/benefits/alternatives to left ureteral  stent exchange was explained to the patient and he understands and wishes to proceed with surgery  Nicolette Bang 07/29/2018, 12:21 PM

## 2018-07-29 NOTE — Anesthesia Preprocedure Evaluation (Signed)
Anesthesia Evaluation  Patient identified by MRN, date of birth, ID band Patient awake    Reviewed: Allergy & Precautions, H&P , NPO status , Patient's Chart, lab work & pertinent test results, reviewed documented beta blocker date and time   History of Anesthesia Complications (+) PONV and history of anesthetic complications  Airway Mallampati: II  TM Distance: >3 FB Neck ROM: full    Dental no notable dental hx. (+) Upper Dentures, Lower Dentures   Pulmonary neg pulmonary ROS, sleep apnea , former smoker,    Pulmonary exam normal breath sounds clear to auscultation       Cardiovascular Exercise Tolerance: Good hypertension, On Medications + CAD and + Peripheral Vascular Disease  negative cardio ROS   Rhythm:regular Rate:Normal     Neuro/Psych PSYCHIATRIC DISORDERS Depression  Neuromuscular disease negative neurological ROS  negative psych ROS   GI/Hepatic negative GI ROS, Neg liver ROS, hiatal hernia, GERD  ,  Endo/Other  negative endocrine ROSdiabetes  Renal/GU Renal diseasenegative Renal ROS  negative genitourinary   Musculoskeletal   Abdominal   Peds  Hematology negative hematology ROS (+) anemia ,   Anesthesia Other Findings Recurrent Cystos about q3 months   Reproductive/Obstetrics negative OB ROS                             Anesthesia Physical Anesthesia Plan  ASA: IV  Anesthesia Plan: General   Post-op Pain Management:    Induction:   PONV Risk Score and Plan:   Airway Management Planned:   Additional Equipment:   Intra-op Plan:   Post-operative Plan:   Informed Consent: I have reviewed the patients History and Physical, chart, labs and discussed the procedure including the risks, benefits and alternatives for the proposed anesthesia with the patient or authorized representative who has indicated his/her understanding and acceptance.   Dental Advisory  Given  Plan Discussed with: CRNA and Anesthesiologist  Anesthesia Plan Comments:         Anesthesia Quick Evaluation

## 2018-07-30 ENCOUNTER — Encounter (HOSPITAL_COMMUNITY): Payer: Self-pay | Admitting: Urology

## 2018-08-10 ENCOUNTER — Telehealth: Payer: Self-pay | Admitting: Family Medicine

## 2018-08-10 NOTE — Telephone Encounter (Signed)
I reviewed the medical record on this gentleman.  He may not like the CPT codes received, but they are correct.  He came in for an annual physical examination, and that is what he received.  I understand that his Medicare may not cover annual physicals.  I addressed his ongoing medical problems, new medical problems, and preventative care at that visit.  The second code, 03/16/2018 was an office visit that was only billed is a [facility].  That cannot be reduced any further.  A new problem was addressed, testing was done, and prescription medications were given.  Perhaps we can clarify this with coding and billing supervisor.

## 2018-08-10 NOTE — Telephone Encounter (Signed)
Sent internal office e-mail to Dr Meda Coffee today and requested that she review the DOS in question on this patients bill.  She will let us know if we need to make any changes in the CPT codes charged for DOS 04-Mar-2018 and 03/16/18.  The patient is saying that [facility] told him that this was not billed correctly.  Please review.

## 2018-08-11 NOTE — Telephone Encounter (Signed)
I called Mr Frankowski and let him know Dr Meda Coffee said she coded this account correctly.

## 2018-08-17 DIAGNOSIS — C61 Malignant neoplasm of prostate: Secondary | ICD-10-CM | POA: Diagnosis not present

## 2018-08-18 DIAGNOSIS — D500 Iron deficiency anemia secondary to blood loss (chronic): Secondary | ICD-10-CM | POA: Diagnosis not present

## 2018-08-20 DIAGNOSIS — E1140 Type 2 diabetes mellitus with diabetic neuropathy, unspecified: Secondary | ICD-10-CM | POA: Diagnosis not present

## 2018-08-20 DIAGNOSIS — Z6823 Body mass index (BMI) 23.0-23.9, adult: Secondary | ICD-10-CM | POA: Diagnosis not present

## 2018-08-20 DIAGNOSIS — K219 Gastro-esophageal reflux disease without esophagitis: Secondary | ICD-10-CM | POA: Diagnosis not present

## 2018-08-20 DIAGNOSIS — D61818 Other pancytopenia: Secondary | ICD-10-CM | POA: Diagnosis not present

## 2018-08-20 DIAGNOSIS — I25810 Atherosclerosis of coronary artery bypass graft(s) without angina pectoris: Secondary | ICD-10-CM | POA: Diagnosis not present

## 2018-08-24 DIAGNOSIS — D509 Iron deficiency anemia, unspecified: Secondary | ICD-10-CM | POA: Diagnosis not present

## 2018-08-24 DIAGNOSIS — I10 Essential (primary) hypertension: Secondary | ICD-10-CM | POA: Diagnosis not present

## 2018-08-24 DIAGNOSIS — N183 Chronic kidney disease, stage 3 (moderate): Secondary | ICD-10-CM | POA: Diagnosis not present

## 2018-08-24 DIAGNOSIS — E559 Vitamin D deficiency, unspecified: Secondary | ICD-10-CM | POA: Diagnosis not present

## 2018-08-24 DIAGNOSIS — Z79899 Other long term (current) drug therapy: Secondary | ICD-10-CM | POA: Diagnosis not present

## 2018-08-24 DIAGNOSIS — R809 Proteinuria, unspecified: Secondary | ICD-10-CM | POA: Diagnosis not present

## 2018-08-25 ENCOUNTER — Ambulatory Visit: Payer: Medicare Other | Admitting: Family Medicine

## 2018-09-02 ENCOUNTER — Ambulatory Visit (INDEPENDENT_AMBULATORY_CARE_PROVIDER_SITE_OTHER): Payer: Medicare Other | Admitting: Urology

## 2018-09-02 DIAGNOSIS — N401 Enlarged prostate with lower urinary tract symptoms: Secondary | ICD-10-CM | POA: Diagnosis not present

## 2018-09-02 DIAGNOSIS — N131 Hydronephrosis with ureteral stricture, not elsewhere classified: Secondary | ICD-10-CM | POA: Diagnosis not present

## 2018-09-08 DIAGNOSIS — D500 Iron deficiency anemia secondary to blood loss (chronic): Secondary | ICD-10-CM | POA: Diagnosis not present

## 2018-09-08 DIAGNOSIS — D61818 Other pancytopenia: Secondary | ICD-10-CM | POA: Diagnosis not present

## 2018-09-11 DIAGNOSIS — I2510 Atherosclerotic heart disease of native coronary artery without angina pectoris: Secondary | ICD-10-CM | POA: Diagnosis present

## 2018-09-11 DIAGNOSIS — E1129 Type 2 diabetes mellitus with other diabetic kidney complication: Secondary | ICD-10-CM | POA: Diagnosis not present

## 2018-09-11 DIAGNOSIS — Z87440 Personal history of urinary (tract) infections: Secondary | ICD-10-CM | POA: Diagnosis not present

## 2018-09-11 DIAGNOSIS — E1122 Type 2 diabetes mellitus with diabetic chronic kidney disease: Secondary | ICD-10-CM | POA: Diagnosis present

## 2018-09-11 DIAGNOSIS — I447 Left bundle-branch block, unspecified: Secondary | ICD-10-CM | POA: Diagnosis present

## 2018-09-11 DIAGNOSIS — Z923 Personal history of irradiation: Secondary | ICD-10-CM | POA: Diagnosis not present

## 2018-09-11 DIAGNOSIS — K219 Gastro-esophageal reflux disease without esophagitis: Secondary | ICD-10-CM | POA: Diagnosis present

## 2018-09-11 DIAGNOSIS — Z8546 Personal history of malignant neoplasm of prostate: Secondary | ICD-10-CM | POA: Diagnosis not present

## 2018-09-11 DIAGNOSIS — I493 Ventricular premature depolarization: Secondary | ICD-10-CM | POA: Diagnosis not present

## 2018-09-11 DIAGNOSIS — E119 Type 2 diabetes mellitus without complications: Secondary | ICD-10-CM | POA: Diagnosis not present

## 2018-09-11 DIAGNOSIS — N136 Pyonephrosis: Secondary | ICD-10-CM | POA: Diagnosis present

## 2018-09-11 DIAGNOSIS — Z960 Presence of urogenital implants: Secondary | ICD-10-CM | POA: Diagnosis present

## 2018-09-11 DIAGNOSIS — E872 Acidosis: Secondary | ICD-10-CM | POA: Diagnosis not present

## 2018-09-11 DIAGNOSIS — N184 Chronic kidney disease, stage 4 (severe): Secondary | ICD-10-CM | POA: Diagnosis present

## 2018-09-11 DIAGNOSIS — R42 Dizziness and giddiness: Secondary | ICD-10-CM | POA: Diagnosis not present

## 2018-09-11 DIAGNOSIS — N189 Chronic kidney disease, unspecified: Secondary | ICD-10-CM | POA: Diagnosis not present

## 2018-09-11 DIAGNOSIS — N390 Urinary tract infection, site not specified: Secondary | ICD-10-CM | POA: Diagnosis not present

## 2018-09-11 DIAGNOSIS — Z951 Presence of aortocoronary bypass graft: Secondary | ICD-10-CM | POA: Diagnosis not present

## 2018-09-11 DIAGNOSIS — D509 Iron deficiency anemia, unspecified: Secondary | ICD-10-CM | POA: Diagnosis present

## 2018-09-11 DIAGNOSIS — N179 Acute kidney failure, unspecified: Secondary | ICD-10-CM | POA: Diagnosis not present

## 2018-09-11 DIAGNOSIS — Z87891 Personal history of nicotine dependence: Secondary | ICD-10-CM | POA: Diagnosis not present

## 2018-09-11 DIAGNOSIS — I129 Hypertensive chronic kidney disease with stage 1 through stage 4 chronic kidney disease, or unspecified chronic kidney disease: Secondary | ICD-10-CM | POA: Diagnosis present

## 2018-09-11 DIAGNOSIS — Z7984 Long term (current) use of oral hypoglycemic drugs: Secondary | ICD-10-CM | POA: Diagnosis not present

## 2018-09-11 DIAGNOSIS — I517 Cardiomegaly: Secondary | ICD-10-CM | POA: Diagnosis not present

## 2018-09-11 DIAGNOSIS — I255 Ischemic cardiomyopathy: Secondary | ICD-10-CM | POA: Diagnosis present

## 2018-09-11 DIAGNOSIS — I10 Essential (primary) hypertension: Secondary | ICD-10-CM | POA: Diagnosis not present

## 2018-09-11 DIAGNOSIS — R531 Weakness: Secondary | ICD-10-CM | POA: Diagnosis present

## 2018-09-11 DIAGNOSIS — Z79899 Other long term (current) drug therapy: Secondary | ICD-10-CM | POA: Diagnosis not present

## 2018-09-11 DIAGNOSIS — E1151 Type 2 diabetes mellitus with diabetic peripheral angiopathy without gangrene: Secondary | ICD-10-CM | POA: Diagnosis present

## 2018-09-11 DIAGNOSIS — B964 Proteus (mirabilis) (morganii) as the cause of diseases classified elsewhere: Secondary | ICD-10-CM | POA: Diagnosis present

## 2018-09-11 DIAGNOSIS — Z880 Allergy status to penicillin: Secondary | ICD-10-CM | POA: Diagnosis not present

## 2018-09-11 DIAGNOSIS — R7989 Other specified abnormal findings of blood chemistry: Secondary | ICD-10-CM | POA: Diagnosis not present

## 2018-09-11 DIAGNOSIS — I259 Chronic ischemic heart disease, unspecified: Secondary | ICD-10-CM | POA: Diagnosis not present

## 2018-09-11 DIAGNOSIS — I509 Heart failure, unspecified: Secondary | ICD-10-CM | POA: Diagnosis not present

## 2018-09-11 DIAGNOSIS — E7800 Pure hypercholesterolemia, unspecified: Secondary | ICD-10-CM | POA: Diagnosis present

## 2018-09-23 ENCOUNTER — Ambulatory Visit (INDEPENDENT_AMBULATORY_CARE_PROVIDER_SITE_OTHER): Payer: Medicare Other | Admitting: Urology

## 2018-09-23 DIAGNOSIS — N401 Enlarged prostate with lower urinary tract symptoms: Secondary | ICD-10-CM | POA: Diagnosis not present

## 2018-09-23 DIAGNOSIS — N3000 Acute cystitis without hematuria: Secondary | ICD-10-CM | POA: Diagnosis not present

## 2018-09-24 DIAGNOSIS — R7989 Other specified abnormal findings of blood chemistry: Secondary | ICD-10-CM | POA: Diagnosis not present

## 2018-09-24 DIAGNOSIS — Z09 Encounter for follow-up examination after completed treatment for conditions other than malignant neoplasm: Secondary | ICD-10-CM | POA: Diagnosis not present

## 2018-09-24 DIAGNOSIS — Z6822 Body mass index (BMI) 22.0-22.9, adult: Secondary | ICD-10-CM | POA: Diagnosis not present

## 2018-09-24 DIAGNOSIS — N1330 Unspecified hydronephrosis: Secondary | ICD-10-CM | POA: Diagnosis not present

## 2018-09-24 DIAGNOSIS — I25810 Atherosclerosis of coronary artery bypass graft(s) without angina pectoris: Secondary | ICD-10-CM | POA: Diagnosis not present

## 2018-09-28 DIAGNOSIS — D649 Anemia, unspecified: Secondary | ICD-10-CM | POA: Diagnosis not present

## 2018-09-28 DIAGNOSIS — D500 Iron deficiency anemia secondary to blood loss (chronic): Secondary | ICD-10-CM | POA: Diagnosis not present

## 2018-09-30 DIAGNOSIS — K219 Gastro-esophageal reflux disease without esophagitis: Secondary | ICD-10-CM | POA: Diagnosis not present

## 2018-09-30 DIAGNOSIS — Z9049 Acquired absence of other specified parts of digestive tract: Secondary | ICD-10-CM | POA: Diagnosis not present

## 2018-09-30 DIAGNOSIS — I129 Hypertensive chronic kidney disease with stage 1 through stage 4 chronic kidney disease, or unspecified chronic kidney disease: Secondary | ICD-10-CM | POA: Diagnosis not present

## 2018-09-30 DIAGNOSIS — Z923 Personal history of irradiation: Secondary | ICD-10-CM | POA: Diagnosis not present

## 2018-09-30 DIAGNOSIS — Z79899 Other long term (current) drug therapy: Secondary | ICD-10-CM | POA: Diagnosis not present

## 2018-09-30 DIAGNOSIS — Z87891 Personal history of nicotine dependence: Secondary | ICD-10-CM | POA: Diagnosis not present

## 2018-09-30 DIAGNOSIS — D61818 Other pancytopenia: Secondary | ICD-10-CM | POA: Diagnosis not present

## 2018-09-30 DIAGNOSIS — D500 Iron deficiency anemia secondary to blood loss (chronic): Secondary | ICD-10-CM | POA: Diagnosis not present

## 2018-09-30 DIAGNOSIS — Z801 Family history of malignant neoplasm of trachea, bronchus and lung: Secondary | ICD-10-CM | POA: Diagnosis not present

## 2018-09-30 DIAGNOSIS — Z800 Family history of malignant neoplasm of digestive organs: Secondary | ICD-10-CM | POA: Diagnosis not present

## 2018-09-30 DIAGNOSIS — Z880 Allergy status to penicillin: Secondary | ICD-10-CM | POA: Diagnosis not present

## 2018-09-30 DIAGNOSIS — R410 Disorientation, unspecified: Secondary | ICD-10-CM | POA: Diagnosis not present

## 2018-09-30 DIAGNOSIS — F39 Unspecified mood [affective] disorder: Secondary | ICD-10-CM | POA: Diagnosis not present

## 2018-09-30 DIAGNOSIS — C61 Malignant neoplasm of prostate: Secondary | ICD-10-CM | POA: Diagnosis not present

## 2018-09-30 DIAGNOSIS — H8110 Benign paroxysmal vertigo, unspecified ear: Secondary | ICD-10-CM | POA: Diagnosis not present

## 2018-09-30 DIAGNOSIS — Z6822 Body mass index (BMI) 22.0-22.9, adult: Secondary | ICD-10-CM | POA: Diagnosis not present

## 2018-09-30 DIAGNOSIS — Z23 Encounter for immunization: Secondary | ICD-10-CM | POA: Diagnosis not present

## 2018-09-30 DIAGNOSIS — Z951 Presence of aortocoronary bypass graft: Secondary | ICD-10-CM | POA: Diagnosis not present

## 2018-09-30 DIAGNOSIS — E785 Hyperlipidemia, unspecified: Secondary | ICD-10-CM | POA: Diagnosis not present

## 2018-10-01 ENCOUNTER — Other Ambulatory Visit: Payer: Self-pay | Admitting: Urology

## 2018-10-13 DIAGNOSIS — D500 Iron deficiency anemia secondary to blood loss (chronic): Secondary | ICD-10-CM | POA: Diagnosis not present

## 2018-10-14 DIAGNOSIS — N183 Chronic kidney disease, stage 3 (moderate): Secondary | ICD-10-CM | POA: Diagnosis not present

## 2018-10-14 DIAGNOSIS — Z79899 Other long term (current) drug therapy: Secondary | ICD-10-CM | POA: Diagnosis not present

## 2018-10-14 DIAGNOSIS — Z1159 Encounter for screening for other viral diseases: Secondary | ICD-10-CM | POA: Diagnosis not present

## 2018-10-14 DIAGNOSIS — I10 Essential (primary) hypertension: Secondary | ICD-10-CM | POA: Diagnosis not present

## 2018-10-14 DIAGNOSIS — R809 Proteinuria, unspecified: Secondary | ICD-10-CM | POA: Diagnosis not present

## 2018-10-14 DIAGNOSIS — D509 Iron deficiency anemia, unspecified: Secondary | ICD-10-CM | POA: Diagnosis not present

## 2018-10-14 DIAGNOSIS — E559 Vitamin D deficiency, unspecified: Secondary | ICD-10-CM | POA: Diagnosis not present

## 2018-10-15 DIAGNOSIS — D500 Iron deficiency anemia secondary to blood loss (chronic): Secondary | ICD-10-CM | POA: Diagnosis not present

## 2018-10-15 DIAGNOSIS — C61 Malignant neoplasm of prostate: Secondary | ICD-10-CM | POA: Diagnosis not present

## 2018-10-16 DIAGNOSIS — N179 Acute kidney failure, unspecified: Secondary | ICD-10-CM | POA: Diagnosis not present

## 2018-10-16 DIAGNOSIS — E872 Acidosis: Secondary | ICD-10-CM | POA: Diagnosis not present

## 2018-10-16 DIAGNOSIS — N184 Chronic kidney disease, stage 4 (severe): Secondary | ICD-10-CM | POA: Diagnosis not present

## 2018-10-16 DIAGNOSIS — D638 Anemia in other chronic diseases classified elsewhere: Secondary | ICD-10-CM | POA: Diagnosis not present

## 2018-10-16 DIAGNOSIS — R809 Proteinuria, unspecified: Secondary | ICD-10-CM | POA: Diagnosis not present

## 2018-10-22 NOTE — Patient Instructions (Signed)
Andrew Zhang  10/22/2018     @PREFPERIOPPHARMACY @   Your procedure is scheduled on  11/04/2018 .  Report to [facility] at  1030   A.M.  Call this number if you have problems the morning of surgery:  [phone]   Remember:  Do not eat or drink after midnight.                          Take these medicines the morning of surgery with A SIP OF WATER  Proscar, prilosec, zoloft, flomax.    Do not wear jewelry, make-up or nail polish.  Do not wear lotions, powders, or perfumes, or deodorant.  Do not shave 48 hours prior to surgery.  Men may shave face and neck.  Do not bring valuables to the hospital.  [facility] is not responsible for any belongings or valuables.  Contacts, dentures or bridgework may not be worn into surgery.  Leave your suitcase in the car.  After surgery it may be brought to your room.  For patients admitted to the hospital, discharge time will be determined by your treatment team.  Patients discharged the day of surgery will not be allowed to drive home.   Name and phone number of your driver:   family Special instructions:  None  Please read over the following fact sheets that you were given. Anesthesia Post-op Instructions and Care and Recovery After Surgery       Cystoscopy Cystoscopy is a procedure that is used to help diagnose and sometimes treat conditions that affect that lower urinary tract. The lower urinary tract includes the bladder and the tube that drains urine from the bladder out of the body (urethra). Cystoscopy is performed with a thin, tube-shaped instrument with a light and camera at the end (cystoscope). The cystoscope may be hard (rigid) or flexible, depending on the goal of the procedure.The cystoscope is inserted through the urethra, into the bladder. Cystoscopy may be recommended if you have:  Urinary tractinfections that keep coming back (recurring).  Blood in the urine (hematuria).  Loss of  bladder control (urinary incontinence) or an overactive bladder.  Unusual cells found in a urine sample.  A blockage in the urethra.  Painful urination.  An abnormality in the bladder found during an intravenous pyelogram (IVP) or CT scan.  Cystoscopy may also be done to remove a sample of tissue to be examined under a microscope (biopsy). Tell a health care provider about:  Any allergies you have.  All medicines you are taking, including vitamins, herbs, eye drops, creams, and over-the-counter medicines.  Any problems you or family members have had with anesthetic medicines.  Any blood disorders you have.  Any surgeries you have had.  Any medical conditions you have.  Whether you are pregnant or may be pregnant. What are the risks? Generally, this is a safe procedure. However, problems may occur, including:  Infection.  Bleeding.  Allergic reactions to medicines.  Damage to other structures or organs.  What happens before the procedure?  Ask your health care provider about: ? Changing or stopping your regular medicines. This is especially important if you are taking diabetes medicines or blood thinners. ? Taking medicines such as aspirin and ibuprofen. These medicines can thin your blood. Do not take these medicines before your procedure if your health care provider instructs you not to.  Follow instructions from  your health care provider about eating or drinking restrictions.  You may be given antibiotic medicine to help prevent infection.  You may have an exam or testing, such as X-rays of the bladder, urethra, or kidneys.  You may have urine tests to check for signs of infection.  Plan to have someone take you home after the procedure. What happens during the procedure?  To reduce your risk of infection,your health care team will wash or sanitize their hands.  You will be given one or more of the following: ? A medicine to help you relax (sedative). ? A  medicine to numb the area (local anesthetic).  The area around the opening of your urethra will be cleaned.  The cystoscope will be passed through your urethra into your bladder.  Germ-free (sterile)fluid will flow through the cystoscope to fill your bladder. The fluid will stretch your bladder so that your surgeon can clearly examine your bladder walls.  The cystoscope will be removed and your bladder will be emptied. The procedure may vary among health care providers and hospitals. What happens after the procedure?  You may have some soreness or pain in your abdomen and urethra. Medicines will be available to help you.  You may have some blood in your urine.  Do not drive for 24 hours if you received a sedative. This information is not intended to replace advice given to you by your health care provider. Make sure you discuss any questions you have with your health care provider. Document Released: 12/13/2000 Document Revised: 04/25/2016 Document Reviewed: 11/02/2015 Elsevier Interactive Patient Education  2018 [employer].  Cystoscopy, Care After Refer to this sheet in the next few weeks. These instructions provide you with information about caring for yourself after your procedure. Your health care provider may also give you more specific instructions. Your treatment has been planned according to current medical practices, but problems sometimes occur. Call your health care provider if you have any problems or questions after your procedure. What can I expect after the procedure? After the procedure, it is common to have:  Mild pain when you urinate. Pain should stop within a few minutes after you urinate. This may last for up to 1 week.  A small amount of blood in your urine for several days.  Feeling like you need to urinate but producing only a small amount of urine.  Follow these instructions at home:  Medicines  Take over-the-counter and prescription medicines only as  told by your health care provider.  If you were prescribed an antibiotic medicine, take it as told by your health care provider. Do not stop taking the antibiotic even if you start to feel better. General instructions   Return to your normal activities as told by your health care provider. Ask your health care provider what activities are safe for you.  Do not drive for 24 hours if you received a sedative.  Watch for any blood in your urine. If the amount of blood in your urine increases, call your health care provider.  Follow instructions from your health care provider about eating or drinking restrictions.  If a tissue sample was removed for testing (biopsy) during your procedure, it is your responsibility to get your test results. Ask your health care provider or the department performing the test when your results will be ready.  Drink enough fluid to keep your urine clear or pale yellow.  Keep all follow-up visits as told by your health care provider. This is  important. Contact a health care provider if:  You have pain that gets worse or does not get better with medicine, especially pain when you urinate.  You have difficulty urinating. Get help right away if:  You have more blood in your urine.  You have blood clots in your urine.  You have abdominal pain.  You have a fever or chills.  You are unable to urinate. This information is not intended to replace advice given to you by your health care provider. Make sure you discuss any questions you have with your health care provider. Document Released: 07/05/2005 Document Revised: 05/23/2016 Document Reviewed: 11/02/2015 Elsevier Interactive Patient Education  2018 [employer].  Ureteral Stent Implantation Ureteral stent implantation is a procedure to insert (implant) a flexible, soft, plastic tube (stent) into a tube (ureter) that drains urine from the kidneys. The stent supports the ureter while it heals and helps to  drain urine from the kidneys. You may have a ureteral stent implanted after having a procedure to remove a blockage from the ureter (ureterolysis or pyeloplasty).You may also have a stent implanted to open the flow of urine when you have a blockage caused by a kidney stone, tumor, blood clot, or infection. You have two ureters, one on each side of the body. The ureters connect the kidneys to the organ that holds urine until it passes out of the body (bladder). The stent is placed so that one end is in the kidney, and one end is in the bladder. The stent is usually taken out after your ureter has healed. Depending on your condition, you may have a stent for just a few weeks, or you may have a long-term stent that will need to be replaced every few months. Tell a health care provider about:  Any allergies you have.  All medicines you are taking, including vitamins, herbs, eye drops, creams, and over-the-counter medicines.  Any problems you or family members have had with anesthetic medicines.  Any blood disorders you have.  Any surgeries you have had.  Any medical conditions you have.  Whether you are pregnant or may be pregnant. What are the risks? Generally, this is a safe procedure. However, problems may occur, including:  Infection.  Bleeding.  Allergic reactions to medicines.  Damage to other structures or organs. Tearing (perforation) of the ureter is possible.  Movement of the stent away from where it is placed during surgery (migration).  What happens before the procedure?  Ask your health care provider about: ? Changing or stopping your regular medicines. This is especially important if you are taking diabetes medicines or blood thinners. ? Taking medicines such as aspirin and ibuprofen. These medicines can thin your blood. Do not take these medicines before your procedure if your health care provider instructs you not to.  Follow instructions from your health care  provider about eating or drinking restrictions.  Do not drink alcohol and do not use any tobacco products before your procedure, as told by your health care provider.  You may be given antibiotic medicine to help prevent infection.  Plan to have someone take you home after the procedure.  If you go home right after the procedure, plan to have someone with you for 24 hours. What happens during the procedure?  An IV tube will be inserted into one of your veins.  You will be given a medicine to make you fall asleep (general anesthetic). You may also be given a medicine to help you relax (sedative).  A thin, tube-shaped instrument with a light and tiny camera at the end (cystoscope) will be inserted into your urethra. The urethra is the tube that drains urine from the bladder out of the body. In men, the urethra opens at the end of the penis. In women, the urethra opens in front of the vaginal opening.  The cystoscope will be passed into your bladder.  A thin wire (guide wire) will be passed through your bladder and into your ureter. This is used to guide the stent into your ureter.  The stent will be inserted into your ureter.  The guide wire and the cystoscope will be removed.  A flexible tube (catheter) will be inserted through your urethra so that one end is in your bladder. This helps to drain urine from your bladder. The procedure may vary among hospitals and health care providers. What happens after the procedure?  Your blood pressure, heart rate, breathing rate, and blood oxygen level will be monitored often until the medicines you were given have worn off.  You may continue to receive medicine and fluids through an IV tube.  You may have some soreness or pain in your abdomen and urethra. Medicines will be available to help you.  You will be encouraged to get up and walk around as soon as you can.  You will have a catheter draining your urine.  You will have some blood in  your urine.  Do not drive for 24 hours if you received a sedative. This information is not intended to replace advice given to you by your health care provider. Make sure you discuss any questions you have with your health care provider. Document Released: 12/13/2000 Document Revised: 05/23/2016 Document Reviewed: 06/30/2015 Elsevier Interactive Patient Education  2018 [place].  Ureteral Stent Implantation, Care After Refer to this sheet in the next few weeks. These instructions provide you with information about caring for yourself after your procedure. Your health care provider may also give you more specific instructions. Your treatment has been planned according to current medical practices, but problems sometimes occur. Call your health care provider if you have any problems or questions after your procedure. What can I expect after the procedure? After the procedure, it is common to have:  Nausea.  Mild pain when you urinate. You may feel this pain in your lower back or lower abdomen. Pain should stop within a few minutes after you urinate. This may last for up to 1 week.  A small amount of blood in your urine for several days.  Follow these instructions at home:  Medicines  Take over-the-counter and prescription medicines only as told by your health care provider.  If you were prescribed an antibiotic medicine, take it as told by your health care provider. Do not stop taking the antibiotic even if you start to feel better.  Do not drive for 24 hours if you received a sedative.  Do not drive or operate heavy machinery while taking prescription pain medicines. Activity  Return to your normal activities as told by your health care provider. Ask your health care provider what activities are safe for you.  Do not lift anything that is heavier than 10 lb (4.5 kg). Follow this limit for 1 week after your procedure, or for as long as told by your health care provider. General  instructions  Watch for any blood in your urine. Call your health care provider if the amount of blood in your urine increases.  If you  have a catheter: ? Follow instructions from your health care provider about taking care of your catheter and collection bag. ? Do not take baths, swim, or use a hot tub until your health care provider approves.  Drink enough fluid to keep your urine clear or pale yellow.  Keep all follow-up visits as told by your health care provider. This is important. Contact a health care provider if:  You have pain that gets worse or does not get better with medicine, especially pain when you urinate.  You have difficulty urinating.  You feel nauseous or you vomit repeatedly during a period of more than 2 days after the procedure. Get help right away if:  Your urine is dark red or has blood clots in it.  You are leaking urine (have incontinence).  The end of the stent comes out of your urethra.  You cannot urinate.  You have sudden, sharp, or severe pain in your abdomen or lower back.  You have a fever. This information is not intended to replace advice given to you by your health care provider. Make sure you discuss any questions you have with your health care provider. Document Released: 08/18/2013 Document Revised: 05/23/2016 Document Reviewed: 06/30/2015 Elsevier Interactive Patient Education  2018 [place] Anesthesia is a term that refers to techniques, procedures, and medicines that help a person stay safe and comfortable during a medical procedure. Monitored anesthesia care, or sedation, is one type of anesthesia. Your anesthesia specialist may recommend sedation if you will be having a procedure that does not require you to be unconscious, such as:  Cataract surgery.  A dental procedure.  A biopsy.  A colonoscopy.  During the procedure, you may receive a medicine to help you relax (sedative). There are three  levels of sedation:  Mild sedation. At this level, you may feel awake and relaxed. You will be able to follow directions.  Moderate sedation. At this level, you will be sleepy. You may not remember the procedure.  Deep sedation. At this level, you will be asleep. You will not remember the procedure.  The more medicine you are given, the deeper your level of sedation will be. Depending on how you respond to the procedure, the anesthesia specialist may change your level of sedation or the type of anesthesia to fit your needs. An anesthesia specialist will monitor you closely during the procedure. Let your health care provider know about:  Any allergies you have.  All medicines you are taking, including vitamins, herbs, eye drops, creams, and over-the-counter medicines.  Any use of steroids (by mouth or as a cream).  Any problems you or family members have had with sedatives and anesthetic medicines.  Any blood disorders you have.  Any surgeries you have had.  Any medical conditions you have, such as sleep apnea.  Whether you are pregnant or may be pregnant.  Any use of cigarettes, alcohol, or street drugs. What are the risks? Generally, this is a safe procedure. However, problems may occur, including:  Getting too much medicine (oversedation).  Nausea.  Allergic reaction to medicines.  Trouble breathing. If this happens, a breathing tube may be used to help with breathing. It will be removed when you are awake and breathing on your own.  Heart trouble.  Lung trouble.  Before the procedure Staying hydrated Follow instructions from your health care provider about hydration, which may include:  Up to 2 hours before the procedure - you may continue to drink  clear liquids, such as water, clear fruit juice, black coffee, and plain tea.  Eating and drinking restrictions Follow instructions from your health care provider about eating and drinking, which may include:  8 hours  before the procedure - stop eating heavy meals or foods such as meat, fried foods, or fatty foods.  6 hours before the procedure - stop eating light meals or foods, such as toast or cereal.  6 hours before the procedure - stop drinking milk or drinks that contain milk.  2 hours before the procedure - stop drinking clear liquids.  Medicines Ask your health care provider about:  Changing or stopping your regular medicines. This is especially important if you are taking diabetes medicines or blood thinners.  Taking medicines such as aspirin and ibuprofen. These medicines can thin your blood. Do not take these medicines before your procedure if your health care provider instructs you not to.  Tests and exams  You will have a physical exam.  You may have blood tests done to show: ? How well your kidneys and liver are working. ? How well your blood can clot.  General instructions  Plan to have someone take you home from the hospital or clinic.  If you will be going home right after the procedure, plan to have someone with you for 24 hours.  What happens during the procedure?  Your blood pressure, heart rate, breathing, level of pain and overall condition will be monitored.  An IV tube will be inserted into one of your veins.  Your anesthesia specialist will give you medicines as needed to keep you comfortable during the procedure. This may mean changing the level of sedation.  The procedure will be performed. After the procedure  Your blood pressure, heart rate, breathing rate, and blood oxygen level will be monitored until the medicines you were given have worn off.  Do not drive for 24 hours if you received a sedative.  You may: ? Feel sleepy, clumsy, or nauseous. ? Feel forgetful about what happened after the procedure. ? Have a sore throat if you had a breathing tube during the procedure. ? Vomit. This information is not intended to replace advice given to you by your  health care provider. Make sure you discuss any questions you have with your health care provider. Document Released: 09/11/2005 Document Revised: 05/24/2016 Document Reviewed: 04/07/2016 Elsevier Interactive Patient Education  2018 [place], Care After These instructions provide you with information about caring for yourself after your procedure. Your health care provider may also give you more specific instructions. Your treatment has been planned according to current medical practices, but problems sometimes occur. Call your health care provider if you have any problems or questions after your procedure. What can I expect after the procedure? After your procedure, it is common to:  Feel sleepy for several hours.  Feel clumsy and have poor balance for several hours.  Feel forgetful about what happened after the procedure.  Have poor judgment for several hours.  Feel nauseous or vomit.  Have a sore throat if you had a breathing tube during the procedure.  Follow these instructions at home: For at least 24 hours after the procedure:   Do not: ? Participate in activities in which you could fall or become injured. ? Drive. ? Use heavy machinery. ? Drink alcohol. ? Take sleeping pills or medicines that cause drowsiness. ? Make important decisions or sign legal documents. ? Take care of children on your  own.  Rest. Eating and drinking  Follow the diet that is recommended by your health care provider.  If you vomit, drink water, juice, or soup when you can drink without vomiting.  Make sure you have little or no nausea before eating solid foods. General instructions  Have a responsible adult stay with you until you are awake and alert.  Take over-the-counter and prescription medicines only as told by your health care provider.  If you smoke, do not smoke without supervision.  Keep all follow-up visits as told by your health care provider.  This is important. Contact a health care provider if:  You keep feeling nauseous or you keep vomiting.  You feel light-headed.  You develop a rash.  You have a fever. Get help right away if:  You have trouble breathing. This information is not intended to replace advice given to you by your health care provider. Make sure you discuss any questions you have with your health care provider. Document Released: 04/07/2016 Document Revised: 08/07/2016 Document Reviewed: 04/07/2016 Elsevier Interactive Patient Education  [employer].

## 2018-10-30 ENCOUNTER — Encounter (HOSPITAL_COMMUNITY)
Admission: RE | Admit: 2018-10-30 | Discharge: 2018-10-30 | Disposition: A | Discharge status: Home / Self Care | Payer: Medicare Other | Source: Ambulatory Visit | Attending: Urology | Admitting: Urology

## 2018-10-30 ENCOUNTER — Encounter (HOSPITAL_COMMUNITY): Payer: Self-pay

## 2018-10-30 ENCOUNTER — Other Ambulatory Visit: Payer: Self-pay

## 2018-10-30 DIAGNOSIS — Z01812 Encounter for preprocedural laboratory examination: Secondary | ICD-10-CM | POA: Diagnosis not present

## 2018-10-30 LAB — BASIC METABOLIC PANEL
Anion gap: 9 (ref 5–15)
BUN: 44 mg/dL — AB (ref 8–23)
CO2: 23 mmol/L (ref 22–32)
Calcium: 8.6 mg/dL — ABNORMAL LOW (ref 8.9–10.3)
Chloride: 109 mmol/L (ref 98–111)
Creatinine, Ser: 3.28 mg/dL — ABNORMAL HIGH (ref 0.61–1.24)
GFR calc non Af Amer: 16 mL/min — ABNORMAL LOW (ref >60)
GFR, EST AFRICAN AMERICAN: 19 mL/min — AB (ref >60)
Glucose, Bld: 208 mg/dL — ABNORMAL HIGH (ref 70–99)
Potassium: 4.4 mmol/L (ref 3.5–5.1)
SODIUM: 141 mmol/L (ref 135–145)

## 2018-10-30 LAB — CBC WITH DIFFERENTIAL/PLATELET
Abs Immature Granulocytes: 0.02 10*3/uL (ref 0.00–0.07)
BASOS ABS: 0 10*3/uL (ref 0.0–0.1)
Basophils Relative: 0 %
EOS ABS: 0.2 10*3/uL (ref 0.0–0.5)
EOS PCT: 4 %
HCT: 36.7 % — ABNORMAL LOW (ref 39.0–52.0)
HEMOGLOBIN: 11 g/dL — AB (ref 13.0–17.0)
Immature Granulocytes: 0 %
Lymphocytes Relative: 13 %
Lymphs Abs: 0.7 10*3/uL (ref 0.7–4.0)
MCH: 28.9 pg (ref 26.0–34.0)
MCHC: 30 g/dL (ref 30.0–36.0)
MCV: 96.6 fL (ref 80.0–100.0)
MONO ABS: 0.3 10*3/uL (ref 0.1–1.0)
Monocytes Relative: 6 %
NRBC: 0 % (ref 0.0–0.2)
Neutro Abs: 3.9 10*3/uL (ref 1.7–7.7)
Neutrophils Relative %: 77 %
Platelets: 174 10*3/uL (ref 150–400)
RBC: 3.8 MIL/uL — AB (ref 4.22–5.81)
RDW: 14.5 % (ref 11.5–15.5)
WBC: 5.1 10*3/uL (ref 4.0–10.5)

## 2018-10-30 LAB — HEMOGLOBIN A1C
Hgb A1c MFr Bld: 5 % (ref 4.8–5.6)
MEAN PLASMA GLUCOSE: 96.8 mg/dL

## 2018-10-30 LAB — GLUCOSE, CAPILLARY: GLUCOSE-CAPILLARY: 216 mg/dL — AB (ref 70–99)

## 2018-11-02 DIAGNOSIS — D500 Iron deficiency anemia secondary to blood loss (chronic): Secondary | ICD-10-CM | POA: Diagnosis not present

## 2018-11-04 ENCOUNTER — Ambulatory Visit (HOSPITAL_COMMUNITY)
Admission: RE | Admit: 2018-11-04 | Discharge: 2018-11-04 | Disposition: A | Discharge status: Home / Self Care | Payer: Medicare Other | Source: Ambulatory Visit | Attending: Urology | Admitting: Urology

## 2018-11-04 ENCOUNTER — Ambulatory Visit (HOSPITAL_COMMUNITY): Payer: Medicare Other

## 2018-11-04 ENCOUNTER — Ambulatory Visit (HOSPITAL_COMMUNITY): Payer: Medicare Other | Admitting: Anesthesiology

## 2018-11-04 ENCOUNTER — Encounter (HOSPITAL_COMMUNITY): Payer: Self-pay | Admitting: Anesthesiology

## 2018-11-04 ENCOUNTER — Encounter (HOSPITAL_COMMUNITY)
Admission: RE | Disposition: A | Discharge status: Home / Self Care | Payer: Self-pay | Source: Ambulatory Visit | Attending: Urology

## 2018-11-04 DIAGNOSIS — Z923 Personal history of irradiation: Secondary | ICD-10-CM | POA: Insufficient documentation

## 2018-11-04 DIAGNOSIS — Z87891 Personal history of nicotine dependence: Secondary | ICD-10-CM | POA: Insufficient documentation

## 2018-11-04 DIAGNOSIS — Z8546 Personal history of malignant neoplasm of prostate: Secondary | ICD-10-CM | POA: Diagnosis not present

## 2018-11-04 DIAGNOSIS — N131 Hydronephrosis with ureteral stricture, not elsewhere classified: Secondary | ICD-10-CM | POA: Diagnosis not present

## 2018-11-04 DIAGNOSIS — I10 Essential (primary) hypertension: Secondary | ICD-10-CM | POA: Insufficient documentation

## 2018-11-04 DIAGNOSIS — E1151 Type 2 diabetes mellitus with diabetic peripheral angiopathy without gangrene: Secondary | ICD-10-CM | POA: Diagnosis not present

## 2018-11-04 DIAGNOSIS — N135 Crossing vessel and stricture of ureter without hydronephrosis: Secondary | ICD-10-CM | POA: Diagnosis present

## 2018-11-04 DIAGNOSIS — G4730 Sleep apnea, unspecified: Secondary | ICD-10-CM | POA: Diagnosis not present

## 2018-11-04 DIAGNOSIS — Z951 Presence of aortocoronary bypass graft: Secondary | ICD-10-CM | POA: Insufficient documentation

## 2018-11-04 DIAGNOSIS — I2510 Atherosclerotic heart disease of native coronary artery without angina pectoris: Secondary | ICD-10-CM | POA: Insufficient documentation

## 2018-11-04 DIAGNOSIS — Z960 Presence of urogenital implants: Secondary | ICD-10-CM

## 2018-11-04 HISTORY — PX: CYSTOSCOPY W/ URETERAL STENT PLACEMENT: SHX1429

## 2018-11-04 LAB — GLUCOSE, CAPILLARY
GLUCOSE-CAPILLARY: 71 mg/dL (ref 70–99)
GLUCOSE-CAPILLARY: 92 mg/dL (ref 70–99)

## 2018-11-04 SURGERY — CYSTOSCOPY, WITH RETROGRADE PYELOGRAM AND URETERAL STENT INSERTION
Anesthesia: General | Site: Ureter | Laterality: Left

## 2018-11-04 MED ORDER — FENTANYL CITRATE (PF) 100 MCG/2ML IJ SOLN
INTRAMUSCULAR | Status: AC
  Filled 2018-11-04: qty 2

## 2018-11-04 MED ORDER — HYDROMORPHONE HCL 1 MG/ML IJ SOLN: 0.2500 mg | INTRAMUSCULAR | Status: DC | PRN

## 2018-11-04 MED ORDER — LIDOCAINE HCL (PF) 1 % IJ SOLN
INTRAMUSCULAR | Status: AC
  Filled 2018-11-04: qty 5

## 2018-11-04 MED ORDER — PHENYLEPHRINE 40 MCG/ML (10ML) SYRINGE FOR IV PUSH (FOR BLOOD PRESSURE SUPPORT)
PREFILLED_SYRINGE | INTRAVENOUS | Status: AC
  Filled 2018-11-04: qty 10

## 2018-11-04 MED ORDER — CEFAZOLIN SODIUM-DEXTROSE 2-4 GM/100ML-% IV SOLN
2.0000 g | INTRAVENOUS | Status: AC
  Administered 2018-11-04: 2 g via INTRAVENOUS
  Filled 2018-11-04: qty 100

## 2018-11-04 MED ORDER — DIATRIZOATE MEGLUMINE 30 % UR SOLN
URETHRAL | Status: DC | PRN
  Administered 2018-11-04: 10 mL via URETHRAL

## 2018-11-04 MED ORDER — ARTIFICIAL TEARS OPHTHALMIC OINT
TOPICAL_OINTMENT | OPHTHALMIC | Status: AC
  Filled 2018-11-04: qty 7

## 2018-11-04 MED ORDER — PROPOFOL 10 MG/ML IV BOLUS
INTRAVENOUS | Status: AC
  Filled 2018-11-04: qty 20

## 2018-11-04 MED ORDER — MEPERIDINE HCL 50 MG/ML IJ SOLN: 6.2500 mg | INTRAMUSCULAR | Status: DC | PRN

## 2018-11-04 MED ORDER — LACTATED RINGERS IV SOLN
INTRAVENOUS | Status: DC | PRN
  Administered 2018-11-04: 12:00:00 via INTRAVENOUS

## 2018-11-04 MED ORDER — PROPOFOL 10 MG/ML IV BOLUS
INTRAVENOUS | Status: DC | PRN
  Administered 2018-11-04: 100 mg via INTRAVENOUS

## 2018-11-04 MED ORDER — ONDANSETRON HCL 4 MG/2ML IJ SOLN
INTRAMUSCULAR | Status: DC | PRN
  Administered 2018-11-04: 4 mg via INTRAVENOUS

## 2018-11-04 MED ORDER — DEXTROSE 50 % IV SOLN
25.0000 mL | Freq: Once | INTRAVENOUS | Status: AC
  Administered 2018-11-04: 25 mL via INTRAVENOUS

## 2018-11-04 MED ORDER — SUCCINYLCHOLINE CHLORIDE 20 MG/ML IJ SOLN
INTRAMUSCULAR | Status: AC
  Filled 2018-11-04: qty 1

## 2018-11-04 MED ORDER — FENTANYL CITRATE (PF) 100 MCG/2ML IJ SOLN
INTRAMUSCULAR | Status: DC | PRN
  Administered 2018-11-04: 25 ug via INTRAVENOUS

## 2018-11-04 MED ORDER — DIATRIZOATE MEGLUMINE 30 % UR SOLN
URETHRAL | Status: AC
  Filled 2018-11-04: qty 100

## 2018-11-04 MED ORDER — STERILE WATER FOR IRRIGATION IR SOLN
Status: DC | PRN
  Administered 2018-11-04: 3000 mL
  Administered 2018-11-04: 1000 mL

## 2018-11-04 MED ORDER — LACTATED RINGERS IV SOLN: INTRAVENOUS | Status: DC

## 2018-11-04 MED ORDER — HYDROCODONE-ACETAMINOPHEN 7.5-325 MG PO TABS: 1.0000 | ORAL_TABLET | Freq: Once | ORAL | Status: DC | PRN

## 2018-11-04 MED ORDER — KETOROLAC TROMETHAMINE 30 MG/ML IJ SOLN: 30.0000 mg | Freq: Once | INTRAMUSCULAR | Status: DC | PRN

## 2018-11-04 MED ORDER — ONDANSETRON HCL 4 MG/2ML IJ SOLN: 4.0000 mg | Freq: Once | INTRAMUSCULAR | Status: DC | PRN

## 2018-11-04 MED ORDER — DEXTROSE 50 % IV SOLN
INTRAVENOUS | Status: AC
  Filled 2018-11-04: qty 50

## 2018-11-04 SURGICAL SUPPLY — 24 items
BAG DRAIN URO TABLE W/ADPT NS (BAG) ×3 IMPLANT
CATH INTERMIT  6FR 70CM (CATHETERS) ×3 IMPLANT
CLOTH BEACON ORANGE TIMEOUT ST (SAFETY) ×3 IMPLANT
DECANTER SPIKE VIAL GLASS SM (MISCELLANEOUS) ×3 IMPLANT
DRAPE PROXIMA HALF (DRAPES) ×3 IMPLANT
GLOVE BIO SURGEON STRL SZ8 (GLOVE) ×3 IMPLANT
GLOVE BIOGEL PI IND STRL 7.0 (GLOVE) ×2 IMPLANT
GLOVE BIOGEL PI INDICATOR 7.0 (GLOVE) ×4
GLOVE ECLIPSE 6.5 STRL STRAW (GLOVE) ×3 IMPLANT
GOWN STRL REUS W/ TWL XL LVL3 (GOWN DISPOSABLE) ×1 IMPLANT
GOWN STRL REUS W/TWL LRG LVL3 (GOWN DISPOSABLE) ×3 IMPLANT
GOWN STRL REUS W/TWL XL LVL3 (GOWN DISPOSABLE) ×2
GUIDEWIRE STR ZIPWIRE 035X150 (MISCELLANEOUS) ×3 IMPLANT
IV NS IRRIG 3000ML ARTHROMATIC (IV SOLUTION) ×3 IMPLANT
KIT TURNOVER CYSTO (KITS) ×3 IMPLANT
MANIFOLD NEPTUNE II (INSTRUMENTS) ×3 IMPLANT
PACK CYSTO (CUSTOM PROCEDURE TRAY) ×3 IMPLANT
PAD ARMBOARD 7.5X6 YLW CONV (MISCELLANEOUS) ×3 IMPLANT
STENT CONTOUR 7FRX24 (STENTS) ×3 IMPLANT
STENT URET 6FRX26 CONTOUR (STENTS) IMPLANT
SYR 10ML LL (SYRINGE) ×3 IMPLANT
TOWEL OR 17X26 4PK STRL BLUE (TOWEL DISPOSABLE) ×3 IMPLANT
WATER STERILE IRR 1000ML UROMA (IV SOLUTION) ×3 IMPLANT
WATER STERILE IRR 3000ML UROMA (IV SOLUTION) ×3 IMPLANT

## 2018-11-04 NOTE — Discharge Instructions (Signed)

## 2018-11-04 NOTE — Anesthesia Postprocedure Evaluation (Signed)
Anesthesia Post Note  Patient: Andrew Zhang  Procedure(s) Performed: CYSTOSCOPY WITH LEFT RETROGRADE PYELOGRAM; LEFT URETERAL STENT EXCHANGE (Left Ureter)  Patient location during evaluation: PACU Anesthesia Type: General Level of consciousness: awake and alert and oriented Pain management: pain level controlled Vital Signs Assessment: post-procedure vital signs reviewed and stable Respiratory status: spontaneous breathing Cardiovascular status: stable Postop Assessment: no apparent nausea or vomiting Anesthetic complications: no     Last Vitals:  Vitals:   11/04/18 1313 11/04/18 1315  BP:  (!) 157/56  Pulse: (!) 121 80  Resp: 13 16  Temp:    SpO2: 94% 95%    Last Pain:  Vitals:   11/04/18 1300  TempSrc:   PainSc: 0-No pain                 [name]

## 2018-11-04 NOTE — Transfer of Care (Signed)
Immediate Anesthesia Transfer of Care Note  Patient: Andrew Zhang  Procedure(s) Performed: CYSTOSCOPY WITH LEFT RETROGRADE PYELOGRAM; LEFT URETERAL STENT EXCHANGE (Left Ureter)  Patient Location: PACU  Anesthesia Type:General  Level of Consciousness: awake, alert , oriented and patient cooperative  Airway & Oxygen Therapy: Patient Spontanous Breathing  Post-op Assessment: Report given to RN and Post -op Vital signs reviewed and stable  Post vital signs: Reviewed and stable  Last Vitals:  Vitals Value Taken Time  BP    Temp    Pulse    Resp    SpO2      Last Pain:  Vitals:   11/04/18 1048  TempSrc: Oral  PainSc: 0-No pain         Complications: No apparent anesthesia complications

## 2018-11-04 NOTE — Anesthesia Procedure Notes (Signed)
Procedure Name: LMA Insertion Date/Time: 11/04/2018 12:21 PM Performed by: Andree Elk,  A, CRNA Pre-anesthesia Checklist: Patient identified, Timeout performed, Emergency Drugs available, Suction available and Patient being monitored Patient Re-evaluated:Patient Re-evaluated prior to induction Oxygen Delivery Method: Simple face mask Preoxygenation: Pre-oxygenation with 100% oxygen Induction Type: IV induction LMA: LMA inserted LMA Size: 5.0 Number of attempts: 1 Placement Confirmation: positive ETCO2 and breath sounds checked- equal and bilateral Tube secured with: Tape

## 2018-11-04 NOTE — Anesthesia Preprocedure Evaluation (Signed)
Anesthesia Evaluation  Patient identified by MRN, date of birth, ID band Patient awake    Reviewed: Allergy & Precautions, H&P , NPO status , Patient's Chart, lab work & pertinent test results, reviewed documented beta blocker date and time   History of Anesthesia Complications (+) PONV and history of anesthetic complications  Airway Mallampati: II  TM Distance: >3 FB Neck ROM: full    Dental no notable dental hx. (+) Edentulous Upper, Partial Lower   Pulmonary neg pulmonary ROS, sleep apnea , former smoker,    Pulmonary exam normal breath sounds clear to auscultation       Cardiovascular Exercise Tolerance: Good hypertension, + CAD, + CABG and + Peripheral Vascular Disease  negative cardio ROS   Rhythm:regular Rate:Normal  AAA (abdominal aortic aneurysm) ([place]).Marland Kitchenstented per patient   Neuro/Psych PSYCHIATRIC DISORDERS Depression  Neuromuscular disease negative neurological ROS  negative psych ROS   GI/Hepatic negative GI ROS, Neg liver ROS, hiatal hernia, GERD  ,  Endo/Other  negative endocrine ROSdiabetes, Type 2  Renal/GU Renal diseasenegative Renal ROSstones  negative genitourinary   Musculoskeletal   Abdominal   Peds  Hematology negative hematology ROS (+) Blood dyscrasia, anemia ,   Anesthesia Other Findings   Reproductive/Obstetrics negative OB ROS                             Anesthesia Physical  Anesthesia Plan  ASA: III  Anesthesia Plan: General   Post-op Pain Management:    Induction:   PONV Risk Score and Plan:   Airway Management Planned:   Additional Equipment:   Intra-op Plan:   Post-operative Plan:   Informed Consent: I have reviewed the patients History and Physical, chart, labs and discussed the procedure including the risks, benefits and alternatives for the proposed anesthesia with the patient or authorized representative who has indicated his/her  understanding and acceptance.   Dental Advisory Given  Plan Discussed with: CRNA  Anesthesia Plan Comments:         Anesthesia Quick Evaluation

## 2018-11-04 NOTE — H&P (Signed)
Urology Admission H&P  Chief Complaint: left ureteral stricture  History of Present Illness: Andrew Zhang is a 84yo with a hx of left ureteral stricture here for stent change. No new LUTS  Past Medical History:  Diagnosis Date  . AAA (abdominal aortic aneurysm) ([place])   . Anemia   . Arthritis   . B12 deficiency   . Blood transfusion without reported diagnosis   . CAD (coronary artery disease)   . Cancer [facility])    radiation for prostate cancer and lupron.  . Cataract   . Complication of anesthesia    sts,"after hearat surgery my stomach didnt wake up like it was supossed to".  . Depression   . Diverticulitis   . DMII (diabetes mellitus, type 2) ([place]) 2008  . GERD (gastroesophageal reflux disease)   . GI bleed   . GIB (gastrointestinal bleeding) 09/18/2016  . Hiatal hernia   . History of kidney stones   . HTN (hypertension)   . Hyperlipidemia   . Iron deficiency anemia due to chronic blood loss 01/08/2017  . Kidney stone   . PONV (postoperative nausea and vomiting)   . Prostate cancer ([place])   . Sleep apnea    cannot tolerate, PCP aware  . Ureteral stricture, left    Past Surgical History:  Procedure Laterality Date  . APPENDECTOMY    . BACK SURGERY     x2  . CATARACT EXTRACTION Left   . CHOLECYSTECTOMY    . COLONOSCOPY  03/2015   Dr. Britta Mccreedy: Diverticulosis, single tubular adenoma removed.  . COLONOSCOPY WITH PROPOFOL N/A 02/25/2017   Dr. Oneida Alar: non-thrombosed external hemorrhoids, significant looping of left colon. severe diverticulosis in recto-sigmoid colon and sigmoid colon. 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 CORONARY ARTERY BYPASS GRAFT     1995  . CYSTOSCOPY     with stent-left kidney. x2  . CYSTOSCOPY W/ URETERAL STENT PLACEMENT Left 05/14/2017   Procedure: CYSTOSCOPY WITH RETROGRADE PYELOGRAM/URETERAL STENT EXCHANGE;  Surgeon: Cleon Gustin, MD;   Location: [facility];  Service: Urology;  Laterality: Left;  . CYSTOSCOPY W/ URETERAL STENT PLACEMENT Left 10/13/2017   Procedure: CYSTOSCOPY WITH LEFT RETROGRADE PYELOGRAM/LEFT URETERAL STENT REPLACEMENT;  Surgeon: Cleon Gustin, MD;  Location: [facility];  Service: Urology;  Laterality: Left;  . CYSTOSCOPY W/ URETERAL STENT PLACEMENT Left 01/07/2018   Procedure: CYSTOSCOPY WITH RETROGRADE PYELOGRAM/URETERAL STENT PLACEMENT;  Surgeon: Cleon Gustin, MD;  Location: [facility];  Service: Urology;  Laterality: Left;  30 MINS STENT "REPLACEMENT" [phone] [id number]  . CYSTOSCOPY W/ URETERAL STENT PLACEMENT Left 04/22/2018   Procedure: CYSTOSCOPY WITH LEFT RETROGRADE PYELOGRAM/LEFT URETERAL STENT PLACEMENT, STENT EXCHANGE;  Surgeon: Cleon Gustin, MD;  Location: [facility];  Service: Urology;  Laterality: Left;  . CYSTOSCOPY W/ URETERAL STENT PLACEMENT Left 07/29/2018   Procedure: CYSTOSCOPY WITH RETROGRADE PYELOGRAM/URETERAL STENT EXCHANGE;  Surgeon: Cleon Gustin, MD;  Location: [facility];  Service: Urology;  Laterality: Left;  30 MINS  STENT EXCHANGE  . CYSTOSCOPY WITH FULGERATION  08/13/2017   Procedure: CYSTOSCOPY WITH FULGERATION OF BLADDER;  Surgeon: Cleon Gustin, MD;  Location: [facility];  Service: Urology;;  . Consuela Mimes WITH RETROGRADE PYELOGRAM, URETEROSCOPY AND STENT PLACEMENT Left 08/13/2017   Procedure: CYSTOSCOPY WITH LEFT RETROGRADE PYELOGRAM, LEFT URETEROSCOPY AND STENT REPLACEMENT;  Surgeon: Cleon Gustin, MD;  Location: [facility];  Service: Urology;  Laterality: Left;  . DESCENDING AORTIC ANEURYSM REPAIR W/ STENT  x2  . ESOPHAGOGASTRODUODENOSCOPY N/A 07/30/2016   Dr. Oneida Alar. Nonobstructing Schatzki ring at GE junction, multiple small sessile polyps with no bleeding or stigmata of recent bleeding in the gastric fundus and gastric body. Benign-appearing intrinsic moderate stenosis found the pylorus status post dilation. Small bowel capsule  deployed.  Marland Kitchen GIVENS CAPSULE STUDY     Capsule study is complete to the cecum. No obvious lesions, mass, tumors. Small wisps of blood noted in small bowel secondary to EGD/dilation. Possible few erosions in setting of NSAIDs. No obvious areas of bleeding.  Marland Kitchen GIVENS CAPSULE STUDY N/A 11/29/2017   Dr. Laural Golden: no evidence of active bleeding in stomach or small bowel. Multiple gastric petechia, single small AVM in proximal small bowel, scattered petechia involving small bowel mucosa.   Marland Kitchen HERNIA REPAIR    . HOLMIUM LASER APPLICATION Left 6/96/7893   Procedure: HOLMIUM LASER LITHOTRIPSY OF ENCRUSTED LEFT URETERAL STENT;  Surgeon: Cleon Gustin, MD;  Location: [facility];  Service: Urology;  Laterality: Left;  . SPINE SURGERY     ruptured discs    Home Medications:  Current Facility-Administered Medications  Medication Dose Route Frequency Provider Last Rate Last Dose  . ceFAZolin (ANCEF) IVPB 2g/100 mL premix  2 g Intravenous 30 min Pre-Op Margerie Fraiser, Candee Furbish, MD       Allergies:  Allergies  Allergen Reactions  . Penicillins Rash and Other (See Comments)    Happened in [phone] Has patient had a PCN reaction causing immediate rash, facial/tongue/throat swelling, SOB or lightheaddness with hypotension: Yes - pt states he "just broke out a little but" after taking Penicillin Has patient had a PCN reaction causing severe rash involving mucus membranes or skin necrosis: Unknown Has patient had a PCN reaction that required hospitalization: No Has patient had a PCN reaction occurring within the last 10 years: No     Family History  Problem Relation Age of Onset  . Heart disease Unknown   . Diabetes Unknown   . Hypertension Unknown   . Liver cancer Mother   . Arthritis Mother   . Cancer Mother        died at 62  . Heart disease Mother   . Alzheimer's disease Father   . Cancer Brother        lung  . Cancer Son 40       lung  . Hepatitis Brother   . Alcohol abuse Brother   . Heart  disease Son 27       heart attack, stents  . Diabetes Son   . Colon cancer Neg Hx    Social History:  reports that he quit smoking about 24 years ago. His smoking use included cigarettes. He started smoking about 67 years ago. He has a 135.00 pack-year smoking history. He has never used smokeless tobacco. He reports that he drinks alcohol. He reports that he does not use drugs.  Review of Systems  All other systems reviewed and are negative.   Physical Exam:  Vital signs in last 24 hours: Temp:  [97.4 F (36.3 C)] 97.4 F (36.3 C) (11/06 1048) Pulse Rate:  [76] 76 (11/06 1048) Resp:  [18] 18 (11/06 1048) BP: (174)/(51) 174/51 (11/06 1048) SpO2:  [97 %] 97 % (11/06 1048) Weight:  [84.8 kg] 84.8 kg (11/06 1048) Physical Exam  Constitutional: He is oriented to person, place, and time. He appears well-developed and well-nourished.  HENT:  Head: Normocephalic and atraumatic.  Eyes: Pupils are equal, round, and reactive to light. EOM  are normal.  Neck: Normal range of motion. No thyromegaly present.  Cardiovascular: Normal rate and regular rhythm.  Respiratory: Effort normal. No respiratory distress.  GI: Soft. He exhibits no distension.  Musculoskeletal: Normal range of motion. He exhibits no edema.  Neurological: He is alert and oriented to person, place, and time.  Skin: Skin is warm and dry.  Psychiatric: He has a normal mood and affect. His behavior is normal. Judgment and thought content normal.    Laboratory Data:  Results for orders placed or performed during the hospital encounter of 11/04/18 (from the past 24 hour(s))  Glucose, capillary     Status: None   Collection Time: 11/04/18 10:58 AM  Result Value Ref Range   Glucose-Capillary 71 70 - 99 mg/dL   No results found for this or any previous visit (from the past 240 hour(s)). Creatinine: Recent Labs    10/30/18 1258  CREATININE 3.28*   Baseline Creatinine: 2  Impression/Assessment:  84yo with left ureteral  stricture  Plan:  The risks/benefits/alternatives to left ureteral stent exchange was explained to the patient and he wishes to proceed with surgery  Andrew Zhang 11/04/2018, 12:04 PM

## 2018-11-04 NOTE — Op Note (Signed)
Preoperative diagnosis:left ureteral stricture  Postoperative diagnosis:same  Procedure: 1 cystoscopy 2. Left retrograde pyelography 3. Intraoperative fluoroscopy, under one hour, with interpretation 4. Left 7x 24JJ stent exchange  Attending: Rosie Fate  Anesthesia: General  Estimated blood loss: None  Drains: Left 7x 24JJ ureteral stent without tether  Specimens:  none  Antibiotics: ancef  Findings:moderatelefthydronephrosis.Stent not encrusted.No masses/lesions in the bladder. Ureteral orifices in normal anatomic location.  Indications: Patient is a 84 year old male with a history a left mid ureteral stricture. After discussing treatment options, he decided proceed with leftstent exchange.  Procedure in detail: The patient was brought to the operating room and a brief timeout was done to ensure correct patient, correct procedure, correct site. General anesthesia was administered patient was placed in dorsal lithotomy position. Hisgenitalia was then prepped and draped in usual sterile fashion. A rigid 31 French cystoscope was passed in the urethra and the bladder. Bladder was inspected free masses or lesions. the ureteral orifices were in the normal orthotopic locations. a 6 french ureteral catheter was then instilled into the left ureteral orifice. a gentle retrograde was obtained and findings noted above. We then used a grasper to bring the ureteral stent to the urethral meatus.we then placed asensorwire through the ureteral stentand advanced up to the renal pelvis. We then removed the stentwe then placed a 7x 24double-j ureteral stent over the original sensorwire. We then removed the wire and good coil was noted in the the renal pelvis under fluoroscopy and the bladder under direct vision. the bladder was then drained and this concluded the procedure which was well tolerated by patient.  Complications: None  Condition: Stable,  extubated, transferred to PACU  Plan: Patient is to be discharged home as to follow-up in 4 weeks with a renal US.

## 2018-11-05 ENCOUNTER — Encounter (HOSPITAL_COMMUNITY): Payer: Self-pay | Admitting: Urology

## 2018-11-17 ENCOUNTER — Other Ambulatory Visit: Payer: Self-pay | Admitting: Urology

## 2018-11-17 DIAGNOSIS — N135 Crossing vessel and stricture of ureter without hydronephrosis: Secondary | ICD-10-CM

## 2018-11-17 DIAGNOSIS — N139 Obstructive and reflux uropathy, unspecified: Secondary | ICD-10-CM

## 2018-11-18 ENCOUNTER — Other Ambulatory Visit: Payer: Self-pay | Admitting: Urology

## 2018-11-18 DIAGNOSIS — N131 Hydronephrosis with ureteral stricture, not elsewhere classified: Secondary | ICD-10-CM

## 2018-11-20 ENCOUNTER — Ambulatory Visit (HOSPITAL_COMMUNITY)
Admission: RE | Admit: 2018-11-20 | Discharge: 2018-11-20 | Disposition: A | Discharge status: Home / Self Care | Payer: Medicare Other | Source: Ambulatory Visit | Attending: Urology | Admitting: Urology

## 2018-11-20 DIAGNOSIS — N131 Hydronephrosis with ureteral stricture, not elsewhere classified: Secondary | ICD-10-CM | POA: Diagnosis not present

## 2018-11-20 DIAGNOSIS — N1339 Other hydronephrosis: Secondary | ICD-10-CM | POA: Diagnosis not present

## 2018-11-23 DIAGNOSIS — D500 Iron deficiency anemia secondary to blood loss (chronic): Secondary | ICD-10-CM | POA: Diagnosis not present

## 2018-11-30 DIAGNOSIS — C61 Malignant neoplasm of prostate: Secondary | ICD-10-CM | POA: Diagnosis not present

## 2018-11-30 DIAGNOSIS — Z79899 Other long term (current) drug therapy: Secondary | ICD-10-CM | POA: Diagnosis not present

## 2018-11-30 DIAGNOSIS — Z923 Personal history of irradiation: Secondary | ICD-10-CM | POA: Diagnosis not present

## 2018-11-30 DIAGNOSIS — Z7982 Long term (current) use of aspirin: Secondary | ICD-10-CM | POA: Diagnosis not present

## 2018-11-30 DIAGNOSIS — Z6822 Body mass index (BMI) 22.0-22.9, adult: Secondary | ICD-10-CM | POA: Diagnosis not present

## 2018-11-30 DIAGNOSIS — N184 Chronic kidney disease, stage 4 (severe): Secondary | ICD-10-CM | POA: Diagnosis not present

## 2018-11-30 DIAGNOSIS — Z880 Allergy status to penicillin: Secondary | ICD-10-CM | POA: Diagnosis not present

## 2018-11-30 DIAGNOSIS — F39 Unspecified mood [affective] disorder: Secondary | ICD-10-CM | POA: Diagnosis not present

## 2018-11-30 DIAGNOSIS — K219 Gastro-esophageal reflux disease without esophagitis: Secondary | ICD-10-CM | POA: Diagnosis not present

## 2018-11-30 DIAGNOSIS — Z87891 Personal history of nicotine dependence: Secondary | ICD-10-CM | POA: Diagnosis not present

## 2018-11-30 DIAGNOSIS — I2510 Atherosclerotic heart disease of native coronary artery without angina pectoris: Secondary | ICD-10-CM | POA: Diagnosis not present

## 2018-11-30 DIAGNOSIS — E785 Hyperlipidemia, unspecified: Secondary | ICD-10-CM | POA: Diagnosis not present

## 2018-11-30 DIAGNOSIS — E1122 Type 2 diabetes mellitus with diabetic chronic kidney disease: Secondary | ICD-10-CM | POA: Diagnosis not present

## 2018-11-30 DIAGNOSIS — Z800 Family history of malignant neoplasm of digestive organs: Secondary | ICD-10-CM | POA: Diagnosis not present

## 2018-11-30 DIAGNOSIS — D500 Iron deficiency anemia secondary to blood loss (chronic): Secondary | ICD-10-CM | POA: Diagnosis not present

## 2018-11-30 DIAGNOSIS — E119 Type 2 diabetes mellitus without complications: Secondary | ICD-10-CM | POA: Diagnosis not present

## 2018-11-30 DIAGNOSIS — N400 Enlarged prostate without lower urinary tract symptoms: Secondary | ICD-10-CM | POA: Diagnosis not present

## 2018-11-30 DIAGNOSIS — D649 Anemia, unspecified: Secondary | ICD-10-CM | POA: Diagnosis not present

## 2018-11-30 DIAGNOSIS — I129 Hypertensive chronic kidney disease with stage 1 through stage 4 chronic kidney disease, or unspecified chronic kidney disease: Secondary | ICD-10-CM | POA: Diagnosis not present

## 2018-11-30 DIAGNOSIS — Z951 Presence of aortocoronary bypass graft: Secondary | ICD-10-CM | POA: Diagnosis not present

## 2018-11-30 DIAGNOSIS — D61818 Other pancytopenia: Secondary | ICD-10-CM | POA: Diagnosis not present

## 2018-12-02 ENCOUNTER — Ambulatory Visit (INDEPENDENT_AMBULATORY_CARE_PROVIDER_SITE_OTHER): Payer: Medicare Other | Admitting: Urology

## 2018-12-02 DIAGNOSIS — N131 Hydronephrosis with ureteral stricture, not elsewhere classified: Secondary | ICD-10-CM | POA: Diagnosis not present

## 2018-12-02 DIAGNOSIS — N401 Enlarged prostate with lower urinary tract symptoms: Secondary | ICD-10-CM | POA: Diagnosis not present

## 2018-12-03 ENCOUNTER — Encounter: Payer: Self-pay | Admitting: Cardiovascular Disease

## 2018-12-03 ENCOUNTER — Ambulatory Visit (INDEPENDENT_AMBULATORY_CARE_PROVIDER_SITE_OTHER): Payer: Medicare Other | Admitting: Cardiovascular Disease

## 2018-12-03 VITALS — BP 160/68 | HR 61 | Ht 74.0 in | Wt 174.0 lb

## 2018-12-03 DIAGNOSIS — I25768 Atherosclerosis of bypass graft of coronary artery of transplanted heart with other forms of angina pectoris: Secondary | ICD-10-CM | POA: Diagnosis not present

## 2018-12-03 DIAGNOSIS — I10 Essential (primary) hypertension: Secondary | ICD-10-CM

## 2018-12-03 DIAGNOSIS — E785 Hyperlipidemia, unspecified: Secondary | ICD-10-CM | POA: Diagnosis not present

## 2018-12-03 DIAGNOSIS — R001 Bradycardia, unspecified: Secondary | ICD-10-CM | POA: Diagnosis not present

## 2018-12-03 DIAGNOSIS — I499 Cardiac arrhythmia, unspecified: Secondary | ICD-10-CM | POA: Diagnosis not present

## 2018-12-03 MED ORDER — METOPROLOL SUCCINATE ER 25 MG PO TB24: 25.0000 mg | ORAL_TABLET | Freq: Every day | ORAL | 3 refills | Status: DC

## 2018-12-03 NOTE — Progress Notes (Addendum)
SUBJECTIVE: Andrew Zhang has a history of DM II, HTN, PVD, and CAD, with a h/o CABG in 02/1994 by Dr. Nils Pyle with a LIMA to LAD, SVG to D1, and SVG to RCA.   Nuclear stress test on 09/14/14 demonstrated myocardial scar in the inferior wall extending from the apex to the base with no evidence of ischemia. Calculated LVEF 50%.  He also has a history of normocytic normochromic anemia likely multifactorial in etiology including iron deficiency, chronic kidney disease stage III, low testosterone, and GI bleeding.  He told me his heart rate was between 39 to 44 bpm yesterday at another physician's office.  Heart rate on 12/02/2018 was 47 bpm as per the EMR.  He denies chest pain and shortness of breath as well as leg swelling and orthopnea.  He denies palpitations.  He said he has vertigo but denies syncope.  I personally reviewed the ECG performed on 04/16/2018 which demonstrated normal sinus rhythm with first-degree AV block, PR 228 ms, and left bundle branch block.  Upon further discussion it appears he was hospitalized at [facility] about a month ago for some sort of infection.  I will have to request records.  ECG performed in the office today which I ordered and personally interpreted demonstrates normal sinus rhythm with frequent PVCs in a pattern of ventricular bigeminy.  Review of Systems: As per "subjective", otherwise negative.  Allergies  Allergen Reactions  . Penicillins Rash and Other (See Comments)    Happened in [phone] Has patient had a PCN reaction causing immediate rash, facial/tongue/throat swelling, SOB or lightheaddness with hypotension: Yes - pt states he "just broke out a little but" after taking Penicillin Has patient had a PCN reaction causing severe rash involving mucus membranes or skin necrosis: Unknown Has patient had a PCN reaction that required hospitalization: No Has patient had a PCN reaction occurring within the last 10 years: No      Current Outpatient Medications  Medication Sig Dispense Refill  . acetaminophen (TYLENOL) 325 MG tablet Take 325 mg by mouth every 8 (eight) hours as needed for moderate pain or headache.     . B Complex Vitamins (B COMPLEX-B12 PO) Take 0.5 drops by mouth daily.    . finasteride (PROSCAR) 5 MG tablet Take 5 mg by mouth daily.    Marland Kitchen glimepiride (AMARYL) 2 MG tablet Take 1 tablet (2 mg total) by mouth daily with breakfast. (Patient taking differently: Take 2 mg by mouth 2 (two) times daily. )    . nitroGLYCERIN (NITROSTAT) 0.4 MG SL tablet Place 1 tablet (0.4 mg total) under the tongue every 5 (five) minutes as needed for chest pain. 25 tablet 3  . omeprazole (PRILOSEC) 10 MG capsule Take 10 mg by mouth daily.     . sertraline (ZOLOFT) 50 MG tablet Take 25 mg by mouth every evening.     . simvastatin (ZOCOR) 80 MG tablet Take 80 mg by mouth at bedtime.    . SODIUM BICARBONATE PO Take 10 mg by mouth daily.    . tamsulosin (FLOMAX) 0.4 MG CAPS Take 0.4 mg by mouth 2 (two) times daily.      No current facility-administered medications for this visit.     Past Medical History:  Diagnosis Date  . AAA (abdominal aortic aneurysm) ([place])   . Anemia   . Arthritis   . B12 deficiency   . Blood transfusion without reported diagnosis   . CAD (coronary artery disease)   .  Cancer [facility])    radiation for prostate cancer and lupron.  . Cataract   . Complication of anesthesia    sts,"after hearat surgery my stomach didnt wake up like it was supossed to".  . Depression   . Diverticulitis   . DMII (diabetes mellitus, type 2) ([place]) 2008  . GERD (gastroesophageal reflux disease)   . GI bleed   . GIB (gastrointestinal bleeding) 09/18/2016  . Hiatal hernia   . History of kidney stones   . HTN (hypertension)   . Hyperlipidemia   . Iron deficiency anemia due to chronic blood loss 01/08/2017  . Kidney stone   . PONV (postoperative nausea and vomiting)   . Prostate cancer ([place])   . Sleep apnea     cannot tolerate, PCP aware  . Ureteral stricture, left     Past Surgical History:  Procedure Laterality Date  . APPENDECTOMY    . BACK SURGERY     x2  . CATARACT EXTRACTION Left   . CHOLECYSTECTOMY    . COLONOSCOPY  03/2015   Dr. Britta Mccreedy: Diverticulosis, single tubular adenoma removed.  . COLONOSCOPY WITH PROPOFOL N/A 02/25/2017   Dr. Oneida Alar: non-thrombosed external hemorrhoids, significant looping of left colon. severe diverticulosis in recto-sigmoid colon and sigmoid colon. 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 CORONARY ARTERY BYPASS GRAFT     1995  . CYSTOSCOPY     with stent-left kidney. x2  . CYSTOSCOPY W/ URETERAL STENT PLACEMENT Left 05/14/2017   Procedure: CYSTOSCOPY WITH RETROGRADE PYELOGRAM/URETERAL STENT EXCHANGE;  Surgeon: Cleon Gustin, MD;  Location: [facility];  Service: Urology;  Laterality: Left;  . CYSTOSCOPY W/ URETERAL STENT PLACEMENT Left 10/13/2017   Procedure: CYSTOSCOPY WITH LEFT RETROGRADE PYELOGRAM/LEFT URETERAL STENT REPLACEMENT;  Surgeon: Cleon Gustin, MD;  Location: [facility];  Service: Urology;  Laterality: Left;  . CYSTOSCOPY W/ URETERAL STENT PLACEMENT Left 01/07/2018   Procedure: CYSTOSCOPY WITH RETROGRADE PYELOGRAM/URETERAL STENT PLACEMENT;  Surgeon: Cleon Gustin, MD;  Location: [facility];  Service: Urology;  Laterality: Left;  30 MINS STENT "REPLACEMENT" [phone] [id number]  . CYSTOSCOPY W/ URETERAL STENT PLACEMENT Left 04/22/2018   Procedure: CYSTOSCOPY WITH LEFT RETROGRADE PYELOGRAM/LEFT URETERAL STENT PLACEMENT, STENT EXCHANGE;  Surgeon: Cleon Gustin, MD;  Location: [facility];  Service: Urology;  Laterality: Left;  . CYSTOSCOPY W/ URETERAL STENT PLACEMENT Left 07/29/2018   Procedure: CYSTOSCOPY WITH RETROGRADE PYELOGRAM/URETERAL STENT EXCHANGE;  Surgeon: Cleon Gustin, MD;  Location: [facility];   Service: Urology;  Laterality: Left;  30 MINS  STENT EXCHANGE  . CYSTOSCOPY W/ URETERAL STENT PLACEMENT Left 11/04/2018   Procedure: CYSTOSCOPY WITH LEFT RETROGRADE PYELOGRAM; LEFT URETERAL STENT EXCHANGE;  Surgeon: Cleon Gustin, MD;  Location: [facility];  Service: Urology;  Laterality: Left;  . CYSTOSCOPY WITH FULGERATION  08/13/2017   Procedure: CYSTOSCOPY WITH FULGERATION OF BLADDER;  Surgeon: Cleon Gustin, MD;  Location: [facility];  Service: Urology;;  . Consuela Mimes WITH RETROGRADE PYELOGRAM, URETEROSCOPY AND STENT PLACEMENT Left 08/13/2017   Procedure: CYSTOSCOPY WITH LEFT RETROGRADE PYELOGRAM, LEFT URETEROSCOPY AND STENT REPLACEMENT;  Surgeon: Cleon Gustin, MD;  Location: [facility];  Service: Urology;  Laterality: Left;  . DESCENDING AORTIC ANEURYSM REPAIR W/ STENT     x2  . ESOPHAGOGASTRODUODENOSCOPY N/A 07/30/2016   Dr. Oneida Alar. Nonobstructing Schatzki ring at GE junction, multiple small sessile polyps with no bleeding or stigmata of recent bleeding in the gastric fundus and gastric body. Benign-appearing intrinsic moderate  stenosis found the pylorus status post dilation. Small bowel capsule deployed.  Marland Kitchen GIVENS CAPSULE STUDY     Capsule study is complete to the cecum. No obvious lesions, mass, tumors. Small wisps of blood noted in small bowel secondary to EGD/dilation. Possible few erosions in setting of NSAIDs. No obvious areas of bleeding.  Marland Kitchen GIVENS CAPSULE STUDY N/A 11/29/2017   Dr. Laural Golden: no evidence of active bleeding in stomach or small bowel. Multiple gastric petechia, single small AVM in proximal small bowel, scattered petechia involving small bowel mucosa.   Marland Kitchen HERNIA REPAIR    . HOLMIUM LASER APPLICATION Left 0/93/8182   Procedure: HOLMIUM LASER LITHOTRIPSY OF ENCRUSTED LEFT URETERAL STENT;  Surgeon: Cleon Gustin, MD;  Location: [facility];  Service: Urology;  Laterality: Left;  . SPINE SURGERY     ruptured discs    Social History   Socioeconomic History  . Marital  status: Married    Spouse name: [name]  . Number of children: 5  . Years of education: 79- GED  . Highest education level: Not on file  Occupational History  . Occupation: retired    Comment: Administrator, [employer]  . Financial resource strain: Not on file  . Food insecurity:    Worry: Not on file    Inability: Not on file  . Transportation needs:    Medical: Not on file    Non-medical: Not on file  Tobacco Use  . Smoking status: Former Smoker    Packs/day: 3.00    Years: 45.00    Pack years: 135.00    Types: Cigarettes    Start date: 04/19/1951    Last attempt to quit: 03/01/1994    Years since quitting: 24.7  . Smokeless tobacco: Never Used  Substance and Sexual Activity  . Alcohol use: Yes    Alcohol/week: 0.0 standard drinks    Comment: 1-2 beers daily   . Drug use: No  . Sexual activity: Yes    Birth control/protection: None    Comment: married 59 years - 5 children   Lifestyle  . Physical activity:    Days per week: Not on file    Minutes per session: Not on file  . Stress: Not on file  Relationships  . Social connections:    Talks on phone: Not on file    Gets together: Not on file    Attends religious service: Not on file    Active member of club or organization: Not on file    Attends meetings of clubs or organizations: Not on file    Relationship status: Not on file  . Intimate partner violence:    Fear of current or ex partner: Not on file    Emotionally abused: Not on file    Physically abused: Not on file    Forced sexual activity: Not on file  Other Topics Concern  . Not on file  Social History Narrative   Married to [place] for 60+ years   4 years in the [place]   Retired, mows grass     Vitals:   12/03/18 1255  BP: (!) 160/68  Pulse: 61  SpO2: 96%  Weight: 174 lb (78.9 kg)  Height: 6\' 2"  (1.88 m)    Wt Readings from Last 3 Encounters:  12/03/18 174 lb (78.9 kg)  11/04/18 186 lb 15.2 oz (84.8 kg)  10/30/18 186 lb 15.2 oz (84.8 kg)      PHYSICAL EXAM General: NAD HEENT: Normal. Neck: No JVD, no  thyromegaly. Lungs: Clear to auscultation bilaterally with normal respiratory effort. CV: Regular rate and irregular rhythm with frequent premature contractions, normal S1/S2, no S3/S4, no murmur. No pretibial or periankle edema.    Abdomen: Soft, nontender, no distention.  Neurologic: Alert and oriented.  Psych: Normal affect. Skin: Normal. Musculoskeletal: No gross deformities.    ECG: Reviewed above under Subjective   Labs: Lab Results  Component Value Date/Time   K 4.4 10/30/2018 12:58 PM   BUN 44 (H) 10/30/2018 12:58 PM   CREATININE 3.28 (H) 10/30/2018 12:58 PM   ALT 22 11/28/2017 05:23 PM   HGB 11.0 (L) 10/30/2018 12:58 PM     Lipids: Lab Results  Component Value Date/Time   LDLCALC 63 02/24/2018 02:06 PM   CHOL 135 02/24/2018 02:06 PM   TRIG 359 (H) 02/24/2018 02:06 PM   HDL 29 (L) 02/24/2018 02:06 PM       ASSESSMENT AND PLAN: 1. CAD s/p 3-vessel CABG: No ischemia by stress testing in September 2015. Symptomatically stable. Continue present medication regimen simvastatin.  He is not on aspirin due to anemia.  2. Essential HTN: Blood pressure is elevated. This will need further monitoring to determine if antihypertensive therapy is required.  3. Hyperlipidemia: Continue simvastatin 80 mg. Lipids from 01/2018 reviewed above, LDL at goal-63.  4. PVD: He had been following with Dr. Sammuel Hines at [facility] I previously encouraged the patient and his wife to call him to make an appointment.  5.  Arrhythmia: Heart rate reportedly 39 to 44 bpm yesterday.  I suspect the monitor was not picking up PVCs as ECG today demonstrates a heart rate of 76 bpm with frequent PVCs in a pattern of ventricular bigeminy.    Disposition: Follow up 6 months   Kate Sable, M.D., F.A.C.C.   Addendum:  Records from [facility] reviewed.  Echocardiogram on 09/14/2018 demonstrated moderately reduced left  ventricular systolic function, LVEF 35 to 40%, moderate concentric LVH, grade 1 diastolic dysfunction, regional wall motion abnormalities.  Renal ultrasound demonstrated chronic bilateral hydronephrosis, left greater than right.  Given these findings, I will start Toprol-XL 25 mg daily.

## 2018-12-03 NOTE — Addendum Note (Signed)
Addended by: Barbarann Ehlers A on: 12/03/2018 04:52 PM   Modules accepted: Orders

## 2018-12-03 NOTE — Patient Instructions (Addendum)
Your physician wants you to follow-up in:6 months  with Dr.Koneswaran You will receive a reminder letter in the mail two months in advance. If you don't receive a letter, please call our office to schedule the follow-up appointment.      Your physician recommends that you continue on your current medications as directed. Please refer to the Current Medication list given to you today.     If you need a refill on your cardiac medications before your next appointment, please call your pharmacy.      No labs or tests today       Thank you for choosing [facility] !         

## 2018-12-07 ENCOUNTER — Other Ambulatory Visit: Payer: Self-pay | Admitting: Urology

## 2018-12-22 IMAGING — CT CT RENAL STONE PROTOCOL
2 of 3 series · 15 of 46 positions shown, 17 images · non-contrast
Comparison: 10/31/2016

CLINICAL DATA: Hematuria, history kidney stones and stenting ;
history prostate cancer, abdominal aortic aneurysm post stenting,
type II diabetes mellitus, hypertension, hiatal hernia, former
smoker

EXAM:
CT ABDOMEN AND PELVIS WITHOUT CONTRAST
TECHNIQUE: Multidetector CT imaging of the abdomen and pelvis was performed
following the standard protocol without IV contrast. Sagittal and
coronal MPR images reconstructed from axial data set. Oral contrast
not administered for this indication.

[Series 2: axial st · axial · 0.79mm/px · z∈[+1039,+1444]mm · 12 of 94 slices shown, 14 images]
[im 7/94  soft-tissue]
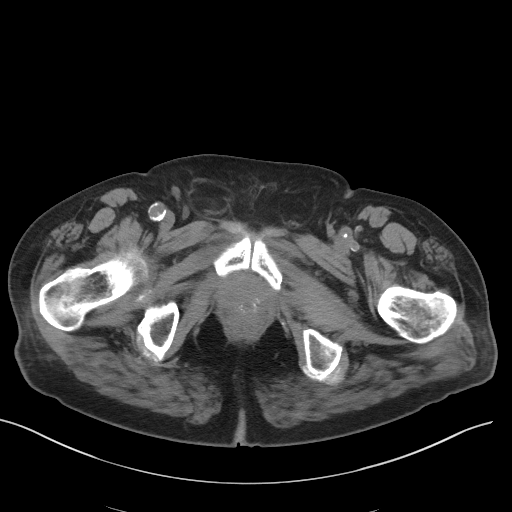
[im 7/94  bone]
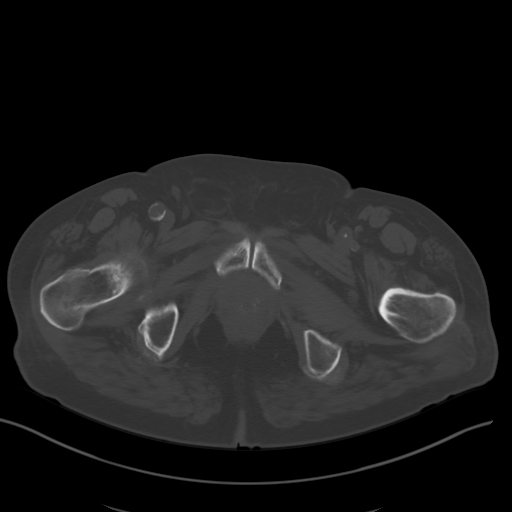
[im 13/94  soft-tissue]
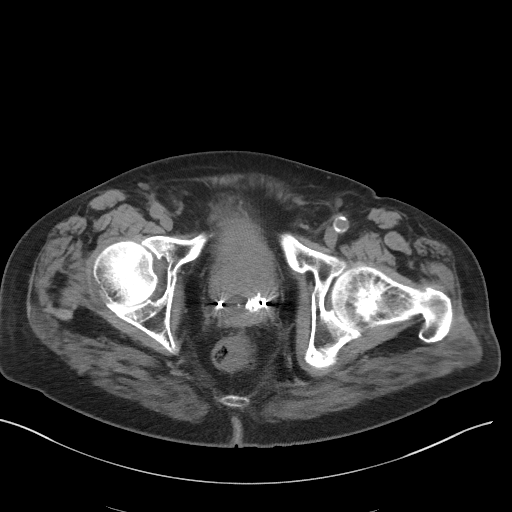
[im 22/94  soft-tissue]
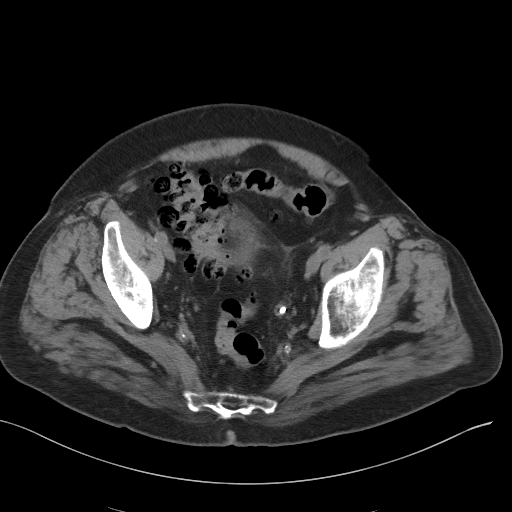
[im 28/94  soft-tissue]
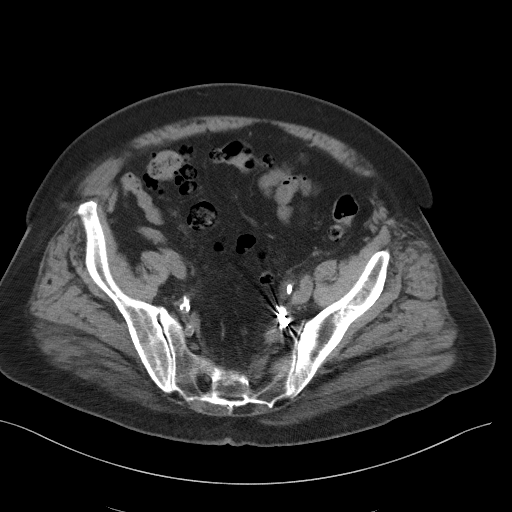
[im 37/94  soft-tissue]
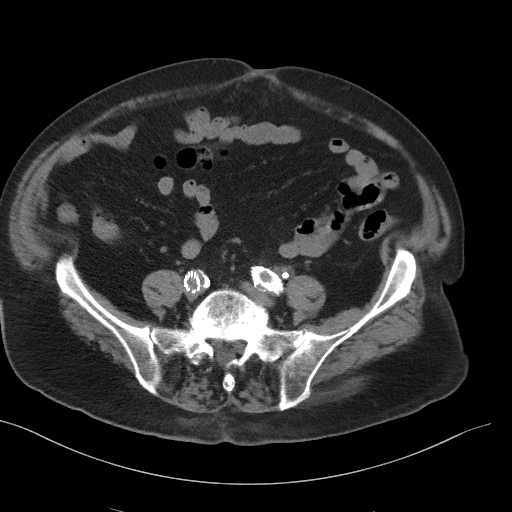
[im 43/94  soft-tissue]
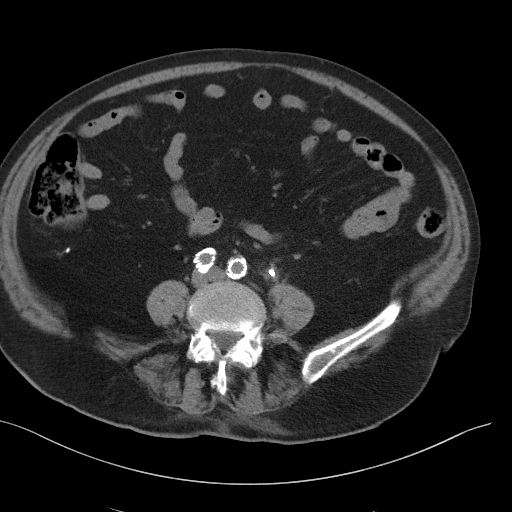
[im 52/94  soft-tissue]
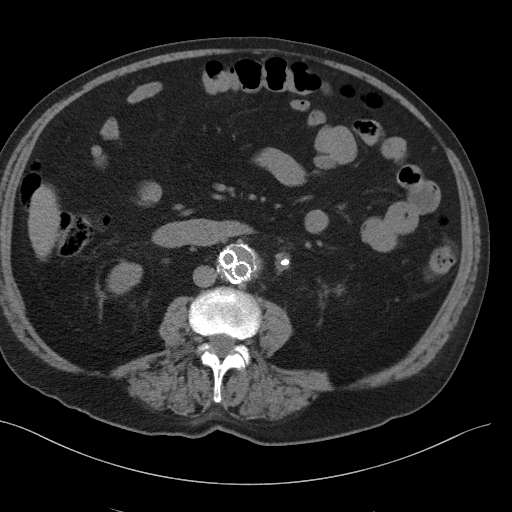
[im 58/94  soft-tissue]
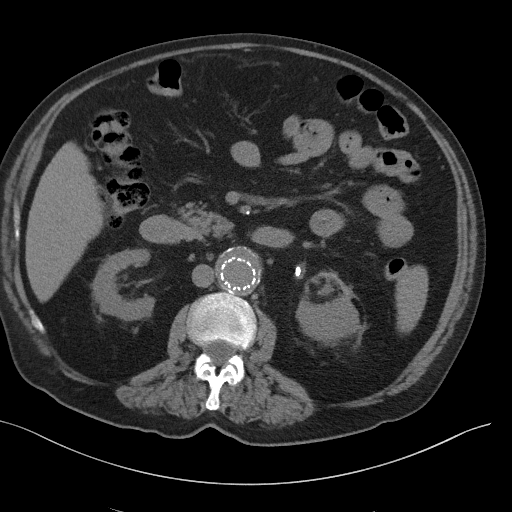
[im 67/94  soft-tissue]
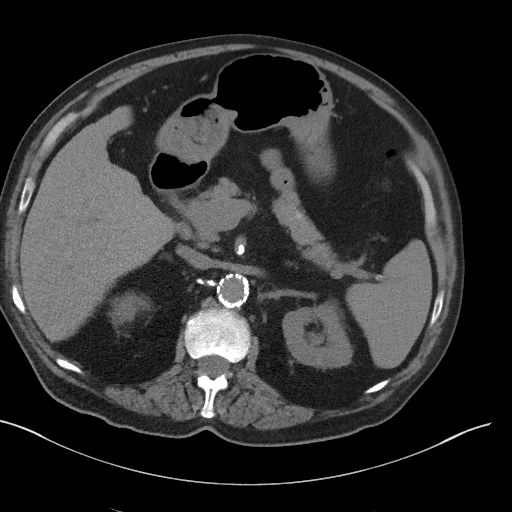
[im 67/94  bone]
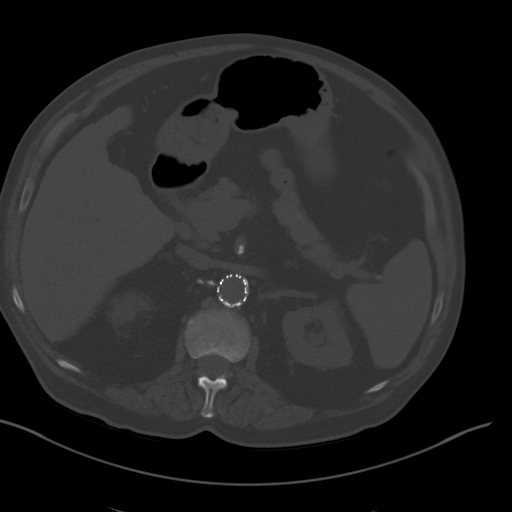
[im 73/94  soft-tissue]
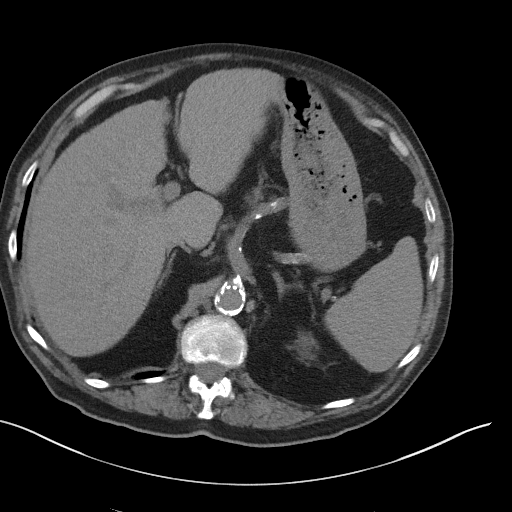
[im 82/94  soft-tissue]
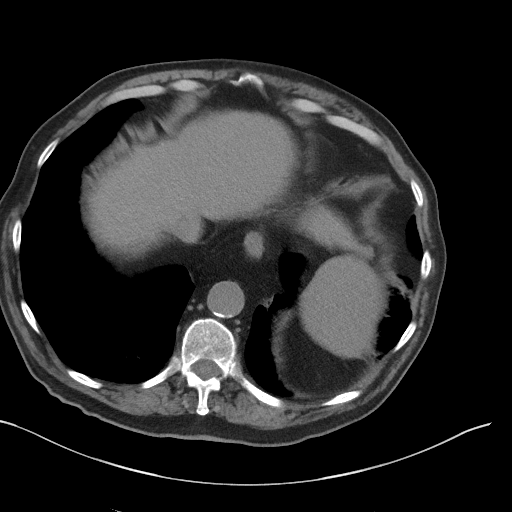
[im 88/94  soft-tissue]
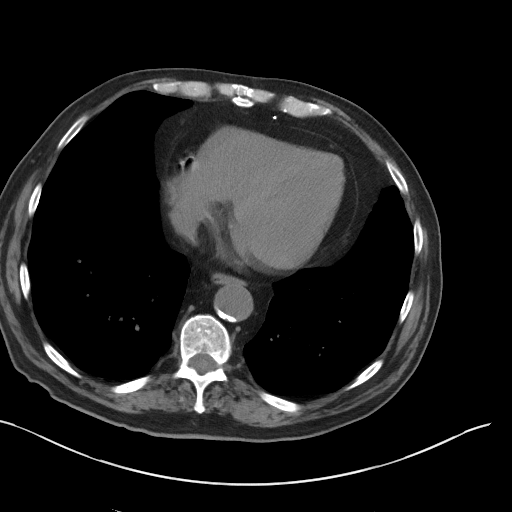

[Series 5: coronal st · coronal · 0.78mm/px · 3 of 111 slices shown]
[im 37/111  soft-tissue]
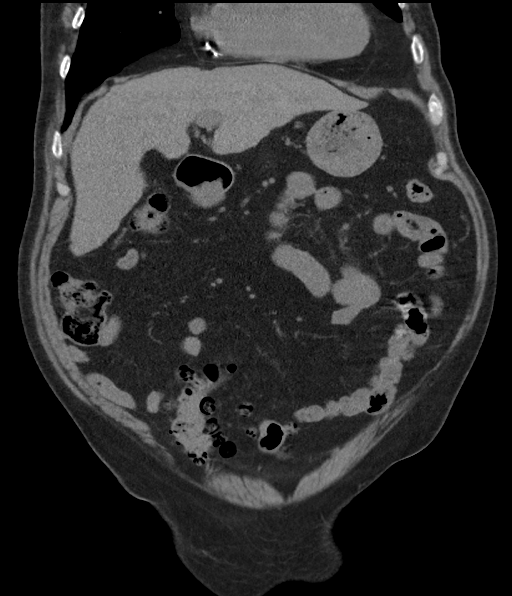
[im 49/111  soft-tissue]
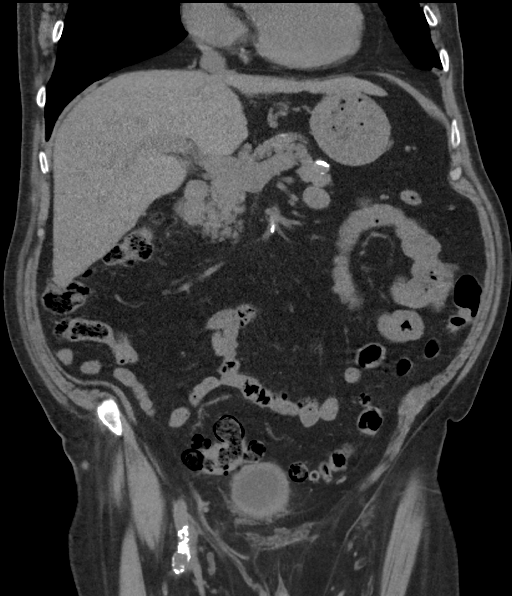
[im 62/111  soft-tissue]
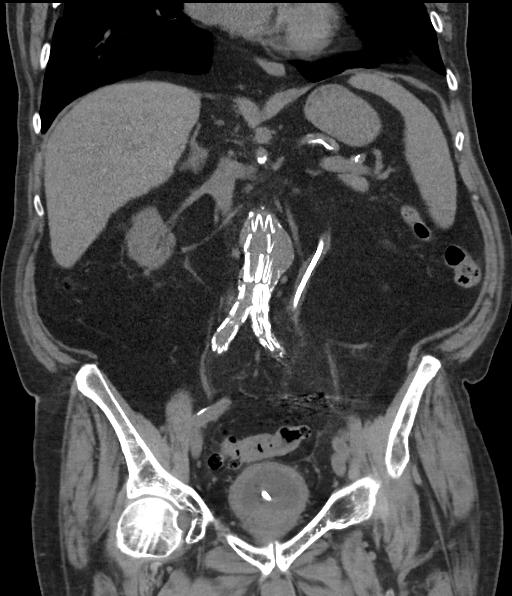

[15 of 46 positions shown; findings below may reference images not displayed]

FINDINGS: Lower chest: Lung bases clear

Hepatobiliary: Gallbladder surgically absent. Slightly nodular
hepatic margins and prominence of lateral segment LEFT lobe and
caudate lobe suggesting cirrhosis.

Pancreas: Normal appearance

Spleen: Normal appearance

Adrenals/Urinary Tract: Adrenal glands normal appearance. LEFT
ureteral stent extends from renal pelvis to urinary bladder. Mild
dilatation of the LEFT ureter surrounding the stent. Tiny
nonobstructing calculus and cortical scarring at inferior pole LEFT
kidney. No additional urinary tract calcification. Mild diffuse
bladder wall thickening which could represent incomplete distention,
chronic outlet obstruction, or cystitis. Mild perivesicular
infiltrative changes.

Stomach/Bowel: Extensive diverticulosis of sigmoid colon, less at
descending colon without evidence of diverticulitis. Appendix
surgically absent. Stomach and bowel loops otherwise normal.

Vascular/Lymphatic: Aneurysmal dilatation of the abdominal aorta up
to 4.1 x 3.7 cm image 38 previously 4.0 x 3.8 cm. Endoluminal stent
within abdominal aorta being the level of the renal arteries and
extending into RIGHT common iliac artery and LEFT external iliac
artery. Embolization coils at LEFT internal iliac artery. No
adenopathy.

Reproductive: Prostatic enlargement gland measuring 5.3 x 5.0 x
cm, elevating bladder base. Scattered prostatic calcifications with
a few surgical clips versus seed implants within prostate gland.

Other: No free air free fluid. BILATERAL inguinal hernias containing
fat.

Musculoskeletal: No acute osseous findings.
IMPRESSION: Significant sigmoid diverticulosis without definite evidence of
diverticulitis.

LEFT ureteral stent with mild persistent LEFT ureteral dilatation.

Nonobstructing calculus and cortical atrophy at inferior pole LEFT
kidney.

No additional urinary tract calcifications identified.

Significant prostatic enlargement 5.3 x 5.0 x 5.7 cm.

Bladder wall thickening which could reflect chronic outlet
obstruction or cystitis ; the presence of mild perivesicular
infiltration favors urinary tract infection/cystitis, recommend
correlation with urinalysis.

Post stenting of abdominal aortic aneurysm, stable.

BILATERAL inguinal hernias containing fat.

Question subtle nodularity of hepatic contours cannot exclude
cirrhosis.

Aortic Atherosclerosis (131Q6-08J.J).

## 2019-01-07 DIAGNOSIS — R809 Proteinuria, unspecified: Secondary | ICD-10-CM | POA: Diagnosis not present

## 2019-01-07 DIAGNOSIS — Z1159 Encounter for screening for other viral diseases: Secondary | ICD-10-CM | POA: Diagnosis not present

## 2019-01-07 DIAGNOSIS — D509 Iron deficiency anemia, unspecified: Secondary | ICD-10-CM | POA: Diagnosis not present

## 2019-01-07 DIAGNOSIS — Z79899 Other long term (current) drug therapy: Secondary | ICD-10-CM | POA: Diagnosis not present

## 2019-01-07 DIAGNOSIS — I10 Essential (primary) hypertension: Secondary | ICD-10-CM | POA: Diagnosis not present

## 2019-01-07 DIAGNOSIS — N183 Chronic kidney disease, stage 3 (moderate): Secondary | ICD-10-CM | POA: Diagnosis not present

## 2019-01-07 DIAGNOSIS — E559 Vitamin D deficiency, unspecified: Secondary | ICD-10-CM | POA: Diagnosis not present

## 2019-01-14 DIAGNOSIS — D500 Iron deficiency anemia secondary to blood loss (chronic): Secondary | ICD-10-CM | POA: Diagnosis not present

## 2019-01-14 DIAGNOSIS — C61 Malignant neoplasm of prostate: Secondary | ICD-10-CM | POA: Diagnosis not present

## 2019-01-22 DIAGNOSIS — E875 Hyperkalemia: Secondary | ICD-10-CM | POA: Diagnosis not present

## 2019-01-28 ENCOUNTER — Other Ambulatory Visit (HOSPITAL_COMMUNITY): Payer: Medicare Other

## 2019-02-08 ENCOUNTER — Other Ambulatory Visit: Payer: Self-pay | Admitting: Orthopedic Surgery

## 2019-02-09 NOTE — Patient Instructions (Signed)
Your procedure is scheduled on: 02/17/2019  Report to [facility] at 11:30    AM.  Call this number if you have problems the morning of surgery: [phone]   Remember:   Do not drink or eat food:After Midnight.  :  Take these medicines the morning of surgery with A SIP OF WATER: Zoloft and Prilosec   Do not wear jewelry, make-up or nail polish.  Do not wear lotions, powders, or perfumes. You may wear deodorant.  Do not shave 48 hours prior to surgery. Men may shave face and neck.  Do not bring valuables to the hospital.  Contacts, dentures or bridgework may not be worn into surgery.  Leave suitcase in the car. After surgery it may be brought to your room.  For patients admitted to the hospital, checkout time is 11:00 AM the day of discharge.   Patients discharged the day of surgery will not be allowed to drive home.    Special Instructions: Shower using CHG night before surgery and shower the day of surgery use CHG.  Use special wash - you have one bottle of CHG for all showers.  You should use approximately 1/2 of the bottle for each shower.  Cystoscopy  Cystoscopy is a procedure that is used to help diagnose and sometimes treat conditions that affect that lower urinary tract. The lower urinary tract includes the bladder and the tube that drains urine from the bladder out of the body (urethra). Cystoscopy is performed with a thin, tube-shaped instrument with a light and camera at the end (cystoscope). The cystoscope may be hard (rigid) or flexible, depending on the goal of the procedure.The cystoscope is inserted through the urethra, into the bladder. Cystoscopy may be recommended if you have:  Urinary tractinfections that keep coming back (recurring).  Blood in the urine (hematuria).  Loss of bladder control (urinary incontinence) or an overactive bladder.  Unusual cells found in a urine sample.  A blockage in the urethra.  Painful urination.  An abnormality in the  bladder found during an intravenous pyelogram (IVP) or CT scan. Cystoscopy may also be done to remove a sample of tissue to be examined under a microscope (biopsy). Tell a health care provider about:  Any allergies you have.  All medicines you are taking, including vitamins, herbs, eye drops, creams, and over-the-counter medicines.  Any problems you or family members have had with anesthetic medicines.  Any blood disorders you have.  Any surgeries you have had.  Any medical conditions you have.  Whether you are pregnant or may be pregnant. What are the risks? Generally, this is a safe procedure. However, problems may occur, including:  Infection.  Bleeding.  Allergic reactions to medicines.  Damage to other structures or organs. What happens before the procedure?  Ask your health care provider about: ? Changing or stopping your regular medicines. This is especially important if you are taking diabetes medicines or blood thinners. ? Taking medicines such as aspirin and ibuprofen. These medicines can thin your blood. Do not take these medicines before your procedure if your health care provider instructs you not to.  Follow instructions from your health care provider about eating or drinking restrictions.  You may be given antibiotic medicine to help prevent infection.  You may have an exam or testing, such as X-rays of the bladder, urethra, or kidneys.  You may have urine tests to check for signs of infection.  Plan to have someone take you home after the procedure. What  happens during the procedure?  To reduce your risk of infection,your health care team will wash or sanitize their hands.  You will be given one or more of the following: ? A medicine to help you relax (sedative). ? A medicine to numb the area (local anesthetic).  The area around the opening of your urethra will be cleaned.  The cystoscope will be passed through your urethra into your  bladder.  Germ-free (sterile)fluid will flow through the cystoscope to fill your bladder. The fluid will stretch your bladder so that your surgeon can clearly examine your bladder walls.  The cystoscope will be removed and your bladder will be emptied. The procedure may vary among health care providers and hospitals. What happens after the procedure?  You may have some soreness or pain in your abdomen and urethra. Medicines will be available to help you.  You may have some blood in your urine.  Do not drive for 24 hours if you received a sedative. This information is not intended to replace advice given to you by your health care provider. Make sure you discuss any questions you have with your health care provider. Document Released: 12/13/2000 Document Revised: 09/26/2017 Document Reviewed: 11/02/2015 Elsevier Interactive Patient Education  2019 [place].  Ureteral Stent Implantation  Ureteral stent implantation is a procedure to insert (implant) a flexible, soft, plastic tube (stent) into a tube (ureter) that drains urine from the kidneys. The stent supports the ureter while it heals and helps to drain urine from the kidneys. You may have a ureteral stent implanted after having a procedure to remove a blockage from the ureter (ureterolysis or pyeloplasty).You may also have a stent implanted to open the flow of urine when you have a blockage caused by a kidney stone, tumor, blood clot, or infection. You have two ureters, one on each side of the body. The ureters connect the kidneys to the organ that holds urine until it passes out of the body (bladder). The stent is placed so that one end is in the kidney, and one end is in the bladder. The stent is usually taken out after your ureter has healed. Depending on your condition, you may have a stent for just a few weeks, or you may have a long-term stent that will need to be replaced every few months. Tell a health care provider  about:  Any allergies you have.  All medicines you are taking, including vitamins, herbs, eye drops, creams, and over-the-counter medicines.  Any problems you or family members have had with anesthetic medicines.  Any blood disorders you have.  Any surgeries you have had.  Any medical conditions you have.  Whether you are pregnant or may be pregnant. What are the risks? Generally, this is a safe procedure. However, problems may occur, including:  Infection.  Bleeding.  Allergic reactions to medicines.  Damage to other structures or organs. Tearing (perforation) of the ureter is possible.  Movement of the stent away from where it is placed during surgery (migration). What happens before the procedure?  Ask your health care provider about: ? Changing or stopping your regular medicines. This is especially important if you are taking diabetes medicines or blood thinners. ? Taking medicines such as aspirin and ibuprofen. These medicines can thin your blood. Do not take these medicines before your procedure if your health care provider instructs you not to.  Follow instructions from your health care provider about eating or drinking restrictions.  Do not drink alcohol and do  not use any tobacco products before your procedure, as told by your health care provider.  You may be given antibiotic medicine to help prevent infection.  Plan to have someone take you home after the procedure.  If you go home right after the procedure, plan to have someone with you for 24 hours. What happens during the procedure?  An IV tube will be inserted into one of your veins.  You will be given a medicine to make you fall asleep (general anesthetic). You may also be given a medicine to help you relax (sedative).  A thin, tube-shaped instrument with a light and tiny camera at the end (cystoscope) will be inserted into your urethra. The urethra is the tube that drains urine from the bladder out of  the body. In men, the urethra opens at the end of the penis. In women, the urethra opens in front of the vaginal opening.  The cystoscope will be passed into your bladder.  A thin wire (guide wire) will be passed through your bladder and into your ureter. This is used to guide the stent into your ureter.  The stent will be inserted into your ureter.  The guide wire and the cystoscope will be removed.  A flexible tube (catheter) will be inserted through your urethra so that one end is in your bladder. This helps to drain urine from your bladder. The procedure may vary among hospitals and health care providers. What happens after the procedure?  Your blood pressure, heart rate, breathing rate, and blood oxygen level will be monitored often until the medicines you were given have worn off.  You may continue to receive medicine and fluids through an IV tube.  You may have some soreness or pain in your abdomen and urethra. Medicines will be available to help you.  You will be encouraged to get up and walk around as soon as you can.  You will have a catheter draining your urine.  You will have some blood in your urine.  Do not drive for 24 hours if you received a sedative. This information is not intended to replace advice given to you by your health care provider. Make sure you discuss any questions you have with your health care provider. Document Released: 12/13/2000 Document Revised: 05/23/2016 Document Reviewed: 06/30/2015 Elsevier Interactive Patient Education  2019 [place] Anesthesia, Adult, Care After This sheet gives you information about how to care for yourself after your procedure. Your health care provider may also give you more specific instructions. If you have problems or questions, contact your health care provider. What can I expect after the procedure? After the procedure, the following side effects are common:  Pain or discomfort at the IV  site.  Nausea.  Vomiting.  Sore throat.  Trouble concentrating.  Feeling cold or chills.  Weak or tired.  Sleepiness and fatigue.  Soreness and body aches. These side effects can affect parts of the body that were not involved in surgery. Follow these instructions at home:  For at least 24 hours after the procedure:  Have a responsible adult stay with you. It is important to have someone help care for you until you are awake and alert.  Rest as needed.  Do not: ? Participate in activities in which you could fall or become injured. ? Drive. ? Use heavy machinery. ? Drink alcohol. ? Take sleeping pills or medicines that cause drowsiness. ? Make important decisions or sign legal documents. ? Take care of  children on your own. Eating and drinking  Follow any instructions from your health care provider about eating or drinking restrictions.  When you feel hungry, start by eating small amounts of foods that are soft and easy to digest (bland), such as toast. Gradually return to your regular diet.  Drink enough fluid to keep your urine pale yellow.  If you vomit, rehydrate by drinking water, juice, or clear broth. General instructions  If you have sleep apnea, surgery and certain medicines can increase your risk for breathing problems. Follow instructions from your health care provider about wearing your sleep device: ? Anytime you are sleeping, including during daytime naps. ? While taking prescription pain medicines, sleeping medicines, or medicines that make you drowsy.  Return to your normal activities as told by your health care provider. Ask your health care provider what activities are safe for you.  Take over-the-counter and prescription medicines only as told by your health care provider.  If you smoke, do not smoke without supervision.  Keep all follow-up visits as told by your health care provider. This is important. Contact a health care provider if:  You  have nausea or vomiting that does not get better with medicine.  You cannot eat or drink without vomiting.  You have pain that does not get better with medicine.  You are unable to pass urine.  You develop a skin rash.  You have a fever.  You have redness around your IV site that gets worse. Get help right away if:  You have difficulty breathing.  You have chest pain.  You have blood in your urine or stool, or you vomit blood. Summary  After the procedure, it is common to have a sore throat or nausea. It is also common to feel tired.  Have a responsible adult stay with you for the first 24 hours after general anesthesia. It is important to have someone help care for you until you are awake and alert.  When you feel hungry, start by eating small amounts of foods that are soft and easy to digest (bland), such as toast. Gradually return to your regular diet.  Drink enough fluid to keep your urine pale yellow.  Return to your normal activities as told by your health care provider. Ask your health care provider what activities are safe for you. This information is not intended to replace advice given to you by your health care provider. Make sure you discuss any questions you have with your health care provider. Document Released: 03/24/2001 Document Revised: 08/01/2017 Document Reviewed: 08/01/2017 Elsevier Interactive Patient Education  2019 [employer].

## 2019-02-10 ENCOUNTER — Other Ambulatory Visit: Payer: Self-pay | Admitting: Urology

## 2019-02-10 DIAGNOSIS — N131 Hydronephrosis with ureteral stricture, not elsewhere classified: Secondary | ICD-10-CM

## 2019-02-11 ENCOUNTER — Other Ambulatory Visit: Payer: Self-pay

## 2019-02-11 ENCOUNTER — Encounter (HOSPITAL_COMMUNITY)
Admission: RE | Admit: 2019-02-11 | Discharge: 2019-02-11 | Disposition: A | Discharge status: Home / Self Care | Payer: Medicare Other | Source: Ambulatory Visit | Attending: Urology | Admitting: Urology

## 2019-02-11 ENCOUNTER — Encounter (HOSPITAL_COMMUNITY): Payer: Self-pay

## 2019-02-11 DIAGNOSIS — Z01812 Encounter for preprocedural laboratory examination: Secondary | ICD-10-CM | POA: Insufficient documentation

## 2019-02-11 LAB — GLUCOSE, CAPILLARY: Glucose-Capillary: 98 mg/dL (ref 70–99)

## 2019-02-11 LAB — BASIC METABOLIC PANEL
Anion gap: 6 (ref 5–15)
BUN: 60 mg/dL — ABNORMAL HIGH (ref 8–23)
CO2: 22 mmol/L (ref 22–32)
Calcium: 8.4 mg/dL — ABNORMAL LOW (ref 8.9–10.3)
Chloride: 113 mmol/L — ABNORMAL HIGH (ref 98–111)
Creatinine, Ser: 3.53 mg/dL — ABNORMAL HIGH (ref 0.61–1.24)
GFR calc Af Amer: 18 mL/min — ABNORMAL LOW (ref >60)
GFR calc non Af Amer: 15 mL/min — ABNORMAL LOW (ref >60)
Glucose, Bld: 96 mg/dL (ref 70–99)
Potassium: 5 mmol/L (ref 3.5–5.1)
Sodium: 141 mmol/L (ref 135–145)

## 2019-02-12 LAB — HEMOGLOBIN A1C
Hgb A1c MFr Bld: 4.6 % — ABNORMAL LOW (ref 4.8–5.6)
Mean Plasma Glucose: 85 mg/dL

## 2019-02-17 ENCOUNTER — Encounter (HOSPITAL_COMMUNITY)
Admission: RE | Disposition: A | Discharge status: Home / Self Care | Payer: Self-pay | Source: Home / Self Care | Attending: Urology

## 2019-02-17 ENCOUNTER — Encounter (HOSPITAL_COMMUNITY): Payer: Self-pay | Admitting: *Deleted

## 2019-02-17 ENCOUNTER — Ambulatory Visit (HOSPITAL_COMMUNITY): Payer: Medicare Other

## 2019-02-17 ENCOUNTER — Ambulatory Visit (HOSPITAL_COMMUNITY): Payer: Medicare Other | Admitting: Anesthesiology

## 2019-02-17 ENCOUNTER — Ambulatory Visit (HOSPITAL_COMMUNITY)
Admission: RE | Admit: 2019-02-17 | Discharge: 2019-02-17 | Disposition: A | Discharge status: Home / Self Care | Payer: Medicare Other | Attending: Urology | Admitting: Urology

## 2019-02-17 DIAGNOSIS — E1122 Type 2 diabetes mellitus with diabetic chronic kidney disease: Secondary | ICD-10-CM | POA: Diagnosis not present

## 2019-02-17 DIAGNOSIS — I2510 Atherosclerotic heart disease of native coronary artery without angina pectoris: Secondary | ICD-10-CM | POA: Diagnosis not present

## 2019-02-17 DIAGNOSIS — Z87891 Personal history of nicotine dependence: Secondary | ICD-10-CM | POA: Diagnosis not present

## 2019-02-17 DIAGNOSIS — K219 Gastro-esophageal reflux disease without esophagitis: Secondary | ICD-10-CM | POA: Insufficient documentation

## 2019-02-17 DIAGNOSIS — I714 Abdominal aortic aneurysm, without rupture: Secondary | ICD-10-CM | POA: Diagnosis not present

## 2019-02-17 DIAGNOSIS — G4730 Sleep apnea, unspecified: Secondary | ICD-10-CM | POA: Insufficient documentation

## 2019-02-17 DIAGNOSIS — Z8546 Personal history of malignant neoplasm of prostate: Secondary | ICD-10-CM | POA: Insufficient documentation

## 2019-02-17 DIAGNOSIS — Z951 Presence of aortocoronary bypass graft: Secondary | ICD-10-CM | POA: Diagnosis not present

## 2019-02-17 DIAGNOSIS — N201 Calculus of ureter: Secondary | ICD-10-CM | POA: Diagnosis not present

## 2019-02-17 DIAGNOSIS — I10 Essential (primary) hypertension: Secondary | ICD-10-CM | POA: Diagnosis not present

## 2019-02-17 DIAGNOSIS — N131 Hydronephrosis with ureteral stricture, not elsewhere classified: Secondary | ICD-10-CM | POA: Diagnosis not present

## 2019-02-17 DIAGNOSIS — N135 Crossing vessel and stricture of ureter without hydronephrosis: Secondary | ICD-10-CM | POA: Diagnosis present

## 2019-02-17 HISTORY — PX: CYSTOSCOPY W/ URETERAL STENT PLACEMENT: SHX1429

## 2019-02-17 LAB — GLUCOSE, CAPILLARY
Glucose-Capillary: 98 mg/dL (ref 70–99)
Glucose-Capillary: 105 mg/dL — ABNORMAL HIGH (ref 70–99)

## 2019-02-17 SURGERY — CYSTOSCOPY, WITH RETROGRADE PYELOGRAM AND URETERAL STENT INSERTION
Anesthesia: General | Site: Ureter | Laterality: Left

## 2019-02-17 MED ORDER — ONDANSETRON HCL 4 MG/2ML IJ SOLN
INTRAMUSCULAR | Status: DC | PRN
  Administered 2019-02-17: 4 mg via INTRAVENOUS

## 2019-02-17 MED ORDER — FENTANYL CITRATE (PF) 100 MCG/2ML IJ SOLN
INTRAMUSCULAR | Status: AC
  Filled 2019-02-17: qty 2

## 2019-02-17 MED ORDER — LACTATED RINGERS IV SOLN
INTRAVENOUS | Status: DC
  Administered 2019-02-17: 1000 mL via INTRAVENOUS

## 2019-02-17 MED ORDER — HYDROMORPHONE HCL 1 MG/ML IJ SOLN: 0.2500 mg | INTRAMUSCULAR | Status: DC | PRN

## 2019-02-17 MED ORDER — DIATRIZOATE MEGLUMINE 30 % UR SOLN
URETHRAL | Status: DC | PRN
  Administered 2019-02-17: 10 mL via URETHRAL

## 2019-02-17 MED ORDER — DIATRIZOATE MEGLUMINE 30 % UR SOLN
URETHRAL | Status: AC
  Filled 2019-02-17: qty 100

## 2019-02-17 MED ORDER — MIDAZOLAM HCL 2 MG/2ML IJ SOLN: 0.5000 mg | Freq: Once | INTRAMUSCULAR | Status: DC | PRN

## 2019-02-17 MED ORDER — EPHEDRINE SULFATE 50 MG/ML IJ SOLN
INTRAMUSCULAR | Status: DC | PRN
  Administered 2019-02-17: 10 mg via INTRAVENOUS

## 2019-02-17 MED ORDER — CEFAZOLIN SODIUM-DEXTROSE 2-4 GM/100ML-% IV SOLN
2.0000 g | INTRAVENOUS | Status: AC
  Administered 2019-02-17: 2 g via INTRAVENOUS

## 2019-02-17 MED ORDER — CEFAZOLIN SODIUM-DEXTROSE 2-4 GM/100ML-% IV SOLN
INTRAVENOUS | Status: AC
  Filled 2019-02-17: qty 100

## 2019-02-17 MED ORDER — LIDOCAINE 2% (20 MG/ML) 5 ML SYRINGE
INTRAMUSCULAR | Status: DC | PRN
  Administered 2019-02-17: 40 mg via INTRAVENOUS

## 2019-02-17 MED ORDER — LIDOCAINE 2% (20 MG/ML) 5 ML SYRINGE
INTRAMUSCULAR | Status: AC
  Filled 2019-02-17: qty 10

## 2019-02-17 MED ORDER — PROMETHAZINE HCL 25 MG/ML IJ SOLN: 6.2500 mg | INTRAMUSCULAR | Status: DC | PRN

## 2019-02-17 MED ORDER — ONDANSETRON HCL 4 MG/2ML IJ SOLN
INTRAMUSCULAR | Status: AC
  Filled 2019-02-17: qty 2

## 2019-02-17 MED ORDER — GLYCOPYRROLATE PF 0.2 MG/ML IJ SOSY
PREFILLED_SYRINGE | INTRAMUSCULAR | Status: DC | PRN
  Administered 2019-02-17: .2 mg via INTRAVENOUS

## 2019-02-17 MED ORDER — FENTANYL CITRATE (PF) 100 MCG/2ML IJ SOLN
INTRAMUSCULAR | Status: DC | PRN
  Administered 2019-02-17: 50 ug via INTRAVENOUS

## 2019-02-17 MED ORDER — SODIUM CHLORIDE 0.9 % IR SOLN
Status: DC | PRN
  Administered 2019-02-17: 3000 mL

## 2019-02-17 MED ORDER — PROPOFOL 10 MG/ML IV BOLUS
INTRAVENOUS | Status: AC
  Filled 2019-02-17: qty 20

## 2019-02-17 MED ORDER — HYDROCODONE-ACETAMINOPHEN 7.5-325 MG PO TABS: 1.0000 | ORAL_TABLET | Freq: Once | ORAL | Status: DC | PRN

## 2019-02-17 MED ORDER — PROPOFOL 10 MG/ML IV BOLUS
INTRAVENOUS | Status: DC | PRN
  Administered 2019-02-17: 150 mg via INTRAVENOUS
  Administered 2019-02-17: 30 mg via INTRAVENOUS
  Administered 2019-02-17: 50 mg via INTRAVENOUS

## 2019-02-17 MED ORDER — STERILE WATER FOR IRRIGATION IR SOLN
Status: DC | PRN
  Administered 2019-02-17: 1000 mL

## 2019-02-17 SURGICAL SUPPLY — 20 items
BAG DRAIN URO TABLE W/ADPT NS (BAG) ×3 IMPLANT
CATH INTERMIT  6FR 70CM (CATHETERS) ×3 IMPLANT
CLOTH BEACON ORANGE TIMEOUT ST (SAFETY) ×3 IMPLANT
DECANTER SPIKE VIAL GLASS SM (MISCELLANEOUS) ×3 IMPLANT
GLOVE BIO SURGEON STRL SZ7 (GLOVE) ×3 IMPLANT
GLOVE BIO SURGEON STRL SZ8 (GLOVE) ×3 IMPLANT
GLOVE BIOGEL PI IND STRL 7.0 (GLOVE) ×2 IMPLANT
GLOVE BIOGEL PI INDICATOR 7.0 (GLOVE) ×4
GOWN STRL REUS W/TWL LRG LVL3 (GOWN DISPOSABLE) ×3 IMPLANT
GOWN STRL REUS W/TWL XL LVL3 (GOWN DISPOSABLE) ×3 IMPLANT
GUIDEWIRE STR ZIPWIRE 035X150 (MISCELLANEOUS) ×3 IMPLANT
IV NS IRRIG 3000ML ARTHROMATIC (IV SOLUTION) ×3 IMPLANT
KIT TURNOVER CYSTO (KITS) ×3 IMPLANT
MANIFOLD NEPTUNE II (INSTRUMENTS) ×3 IMPLANT
PACK CYSTO (CUSTOM PROCEDURE TRAY) ×3 IMPLANT
PAD ARMBOARD 7.5X6 YLW CONV (MISCELLANEOUS) ×3 IMPLANT
STENT CONTOUR 7FRX24 (STENTS) ×3 IMPLANT
SYR 10ML LL (SYRINGE) ×3 IMPLANT
TOWEL OR 17X26 4PK STRL BLUE (TOWEL DISPOSABLE) ×3 IMPLANT
WATER STERILE IRR 500ML POUR (IV SOLUTION) ×3 IMPLANT

## 2019-02-17 NOTE — Discharge Instructions (Signed)
Ureteral Stent Implantation  Ureteral stent implantation is a procedure to insert (implant) a flexible, soft, plastic tube (stent) into a tube (ureter) that drains urine from the kidneys. The stent supports the ureter while it heals and helps to drain urine from the kidneys. You may have a ureteral stent implanted after having a procedure to remove a blockage from the ureter (ureterolysis or pyeloplasty).You may also have a stent implanted to open the flow of urine when you have a blockage caused by a kidney stone, tumor, blood clot, or infection. You have two ureters, one on each side of the body. The ureters connect the kidneys to the organ that holds urine until it passes out of the body (bladder). The stent is placed so that one end is in the kidney, and one end is in the bladder. The stent is usually taken out after your ureter has healed. Depending on your condition, you may have a stent for just a few weeks, or you may have a long-term stent that will need to be replaced every few months. Tell a health care provider about:  Any allergies you have.  All medicines you are taking, including vitamins, herbs, eye drops, creams, and over-the-counter medicines.  Any problems you or family members have had with anesthetic medicines.  Any blood disorders you have.  Any surgeries you have had.  Any medical conditions you have.  Whether you are pregnant or may be pregnant. What are the risks? Generally, this is a safe procedure. However, problems may occur, including:  Infection.  Bleeding.  Allergic reactions to medicines.  Damage to other structures or organs. Tearing (perforation) of the ureter is possible.  Movement of the stent away from where it is placed during surgery (migration). What happens before the procedure?  Ask your health care provider about: ? Changing or stopping your regular medicines. This is especially important if you are taking diabetes medicines or blood  thinners. ? Taking medicines such as aspirin and ibuprofen. These medicines can thin your blood. Do not take these medicines before your procedure if your health care provider instructs you not to.  Follow instructions from your health care provider about eating or drinking restrictions.  Do not drink alcohol and do not use any tobacco products before your procedure, as told by your health care provider.  You may be given antibiotic medicine to help prevent infection.  Plan to have someone take you home after the procedure.  If you go home right after the procedure, plan to have someone with you for 24 hours. What happens during the procedure?  An IV tube will be inserted into one of your veins.  You will be given a medicine to make you fall asleep (general anesthetic). You may also be given a medicine to help you relax (sedative).  A thin, tube-shaped instrument with a light and tiny camera at the end (cystoscope) will be inserted into your urethra. The urethra is the tube that drains urine from the bladder out of the body. In men, the urethra opens at the end of the penis. In women, the urethra opens in front of the vaginal opening.  The cystoscope will be passed into your bladder.  A thin wire (guide wire) will be passed through your bladder and into your ureter. This is used to guide the stent into your ureter.  The stent will be inserted into your ureter.  The guide wire and the cystoscope will be removed.  A flexible tube (catheter) will  be inserted through your urethra so that one end is in your bladder. This helps to drain urine from your bladder. The procedure may vary among hospitals and health care providers. What happens after the procedure?  Your blood pressure, heart rate, breathing rate, and blood oxygen level will be monitored often until the medicines you were given have worn off.  You may continue to receive medicine and fluids through an IV tube.  You may have  some soreness or pain in your abdomen and urethra. Medicines will be available to help you.  You will be encouraged to get up and walk around as soon as you can.  You will have a catheter draining your urine.  You will have some blood in your urine.  Do not drive for 24 hours if you received a sedative. This information is not intended to replace advice given to you by your health care provider. Make sure you discuss any questions you have with your health care provider. Document Released: 12/13/2000 Document Revised: 05/23/2016 Document Reviewed: 06/30/2015 Elsevier Interactive Patient Education  2019 Early Anesthesia, Adult, Care After This sheet gives you information about how to care for yourself after your procedure. Your health care provider may also give you more specific instructions. If you have problems or questions, contact your health care provider. What can I expect after the procedure? After the procedure, the following side effects are common:  Pain or discomfort at the IV site.  Nausea.  Vomiting.  Sore throat.  Trouble concentrating.  Feeling cold or chills.  Weak or tired.  Sleepiness and fatigue.  Soreness and body aches. These side effects can affect parts of the body that were not involved in surgery. Follow these instructions at home:  For at least 24 hours after the procedure:  Have a responsible adult stay with you. It is important to have someone help care for you until you are awake and alert.  Rest as needed.  Do not: ? Participate in activities in which you could fall or become injured. ? Drive. ? Use heavy machinery. ? Drink alcohol. ? Take sleeping pills or medicines that cause drowsiness. ? Make important decisions or sign legal documents. ? Take care of children on your own. Eating and drinking  Follow any instructions from your health care provider about eating or drinking restrictions.  When you feel  hungry, start by eating small amounts of foods that are soft and easy to digest (bland), such as toast. Gradually return to your regular diet.  Drink enough fluid to keep your urine pale yellow.  If you vomit, rehydrate by drinking water, juice, or clear broth. General instructions  If you have sleep apnea, surgery and certain medicines can increase your risk for breathing problems. Follow instructions from your health care provider about wearing your sleep device: ? Anytime you are sleeping, including during daytime naps. ? While taking prescription pain medicines, sleeping medicines, or medicines that make you drowsy.  Return to your normal activities as told by your health care provider. Ask your health care provider what activities are safe for you.  Take over-the-counter and prescription medicines only as told by your health care provider.  If you smoke, do not smoke without supervision.  Keep all follow-up visits as told by your health care provider. This is important. Contact a health care provider if:  You have nausea or vomiting that does not get better with medicine.  You cannot eat or  drink without vomiting.  You have pain that does not get better with medicine.  You are unable to pass urine.  You develop a skin rash.  You have a fever.  You have redness around your IV site that gets worse. Get help right away if:  You have difficulty breathing.  You have chest pain.  You have blood in your urine or stool, or you vomit blood. Summary  After the procedure, it is common to have a sore throat or nausea. It is also common to feel tired.  Have a responsible adult stay with you for the first 24 hours after general anesthesia. It is important to have someone help care for you until you are awake and alert.  When you feel hungry, start by eating small amounts of foods that are soft and easy to digest (bland), such as toast. Gradually return to your regular  diet.  Drink enough fluid to keep your urine pale yellow.  Return to your normal activities as told by your health care provider. Ask your health care provider what activities are safe for you. This information is not intended to replace advice given to you by your health care provider. Make sure you discuss any questions you have with your health care provider. Document Released: 03/24/2001 Document Revised: 08/01/2017 Document Reviewed: 08/01/2017 Elsevier Interactive Patient Education  2019 [employer].

## 2019-02-17 NOTE — Anesthesia Preprocedure Evaluation (Signed)
Anesthesia Evaluation  Patient identified by MRN, date of birth, ID band Patient awake  General Assessment Comment:Remotely reports ileus after CABG in 1990s  Reviewed: Allergy & Precautions, NPO status , Patient's Chart, lab work & pertinent test results  History of Anesthesia Complications (+) PONV  Airway Mallampati: II  TM Distance: >3 FB Neck ROM: Full    Dental no notable dental hx. (+) Edentulous Upper   Pulmonary sleep apnea , former smoker,  States has CPAP but hasnt used in years  Unable to tolerate   Pulmonary exam normal breath sounds clear to auscultation       Cardiovascular hypertension, Pt. on medications + CAD and + CABG  Normal cardiovascular examI Rhythm:Regular Rate:Normal     Neuro/Psych Depression negative neurological ROS  negative psych ROS   GI/Hepatic Neg liver ROS, hiatal hernia, GERD  Medicated and Controlled,  Endo/Other  negative endocrine ROSdiabetes  Renal/GU Renal InsufficiencyRenal disease  negative genitourinary   Musculoskeletal  (+) Arthritis ,   Abdominal   Peds negative pediatric ROS (+)  Hematology negative hematology ROS (+) anemia ,   Anesthesia Other Findings   Reproductive/Obstetrics negative OB ROS                             Anesthesia Physical Anesthesia Plan  ASA: III  Anesthesia Plan: General   Post-op Pain Management:    Induction: Intravenous  PONV Risk Score and Plan:   Airway Management Planned: LMA  Additional Equipment:   Intra-op Plan:   Post-operative Plan: Extubation in OR  Informed Consent: I have reviewed the patients History and Physical, chart, labs and discussed the procedure including the risks, benefits and alternatives for the proposed anesthesia with the patient or authorized representative who has indicated his/her understanding and acceptance.     Dental advisory given  Plan Discussed with:  CRNA  Anesthesia Plan Comments: (LMA vs Mask vs ETT as needed )        Anesthesia Quick Evaluation

## 2019-02-17 NOTE — Op Note (Signed)
Preoperative diagnosis:left ureteral stricture  Postoperative diagnosis:same  Procedure: 1 cystoscopy 2. Left retrograde pyelography 3. Intraoperative fluoroscopy, under one hour, with interpretation 4. Left 7x 24JJ stent exchange  Attending: Nicolette Bang  Anesthesia: General  Estimated blood loss: None  Drains: Left 7x 24JJ ureteral stent without tether  Specimens:none  Antibiotics: ancef  Findings:moderatelefthydronephrosis.Stent not encrusted.No masses/lesions in the bladder. Ureteral orifices in normal anatomic location.  Indications: Patient is a 85 year old male with a history a left mid ureteral stricture. After discussing treatment options, he decided proceed with leftstent exchange.  Procedure in detail: The patient was brought to the operating room and a brief timeout was done to ensure correct patient, correct procedure, correct site. General anesthesia was administered patient was placed in dorsal lithotomy position. Hisgenitalia was then prepped and draped in usual sterile fashion. A rigid 82 French cystoscope was passed in the urethra and the bladder. Bladder was inspected free masses or lesions. the ureteral orifices were in the normal orthotopic locations. a 6 french ureteral catheter was then instilled into the left ureteral orifice. a gentle retrograde was obtained and findings noted above. We then used a grasper to bring the ureteral stent to the urethral meatus.we then placed asensorwire through the ureteral stentand advanced up to the renal pelvis. We then removed the stentwe then placed a 7x 24double-j ureteral stent over the original sensorwire. We then removed the wire and good coil was noted in the the renal pelvis under fluoroscopy and the bladder under direct vision. the bladder was then drained and this concluded the procedure which was well tolerated by patient.  Complications: None  Condition: Stable,  extubated, transferred to PACU  Plan: Patient is to be discharged home as to follow-up in 4 weeks with a renal US.

## 2019-02-17 NOTE — Anesthesia Procedure Notes (Signed)
Procedure Name: LMA Insertion Date/Time: 02/17/2019 12:58 PM Performed by: Andree Elk, Amy A, CRNA Pre-anesthesia Checklist: Patient identified, Emergency Drugs available, Suction available, Patient being monitored and Timeout performed Patient Re-evaluated:Patient Re-evaluated prior to induction Oxygen Delivery Method: Circle system utilized Preoxygenation: Pre-oxygenation with 100% oxygen Induction Type: IV induction Ventilation: Mask ventilation without difficulty LMA: LMA inserted LMA Size: 5.0 Number of attempts: 1 Placement Confirmation: positive ETCO2 and breath sounds checked- equal and bilateral Tube secured with: Tape

## 2019-02-17 NOTE — H&P (Signed)
Urology Admission H&P  Chief Complaint: left ureteral obstruction  History of Present Illness: [name] is a 85yo with a hx of left ureterla stricture who is managed with an indwelling stent. His last stent change was 4 months ago. No new LUTS. No hematuria or dysuria. No fevers/chills/sweats  Past Medical History:  Diagnosis Date  . AAA (abdominal aortic aneurysm) ([place])   . Anemia   . Arthritis   . B12 deficiency   . Blood transfusion without reported diagnosis   . CAD (coronary artery disease)   . Cancer [facility])    radiation for prostate cancer and lupron.  . Cataract   . Complication of anesthesia    sts,"after hearat surgery my stomach didnt wake up like it was supossed to".  . Depression   . Diverticulitis   . DMII (diabetes mellitus, type 2) ([place]) 2008  . GERD (gastroesophageal reflux disease)   . GI bleed   . GIB (gastrointestinal bleeding) 09/18/2016  . Hiatal hernia   . History of kidney stones   . HTN (hypertension)   . Hyperlipidemia   . Iron deficiency anemia due to chronic blood loss 01/08/2017  . Kidney stone   . PONV (postoperative nausea and vomiting)   . Prostate cancer ([place])   . Sleep apnea    cannot tolerate, PCP aware  . Ureteral stricture, left    Past Surgical History:  Procedure Laterality Date  . APPENDECTOMY    . BACK SURGERY     x2  . CATARACT EXTRACTION Left   . CHOLECYSTECTOMY    . COLONOSCOPY  03/2015   Dr. Britta Mccreedy: Diverticulosis, single tubular adenoma removed.  . COLONOSCOPY WITH PROPOFOL N/A 02/25/2017   Dr. Oneida Alar: non-thrombosed external hemorrhoids, significant looping of left colon. severe diverticulosis in recto-sigmoid colon and sigmoid colon. 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 CORONARY ARTERY BYPASS GRAFT     1995  . CYSTOSCOPY     with stent-left kidney. x2  . CYSTOSCOPY W/ URETERAL STENT PLACEMENT Left 05/14/2017    Procedure: CYSTOSCOPY WITH RETROGRADE PYELOGRAM/URETERAL STENT EXCHANGE;  Surgeon: Cleon Gustin, MD;  Location: [facility];  Service: Urology;  Laterality: Left;  . CYSTOSCOPY W/ URETERAL STENT PLACEMENT Left 10/13/2017   Procedure: CYSTOSCOPY WITH LEFT RETROGRADE PYELOGRAM/LEFT URETERAL STENT REPLACEMENT;  Surgeon: Cleon Gustin, MD;  Location: [facility];  Service: Urology;  Laterality: Left;  . CYSTOSCOPY W/ URETERAL STENT PLACEMENT Left 01/07/2018   Procedure: CYSTOSCOPY WITH RETROGRADE PYELOGRAM/URETERAL STENT PLACEMENT;  Surgeon: Cleon Gustin, MD;  Location: [facility];  Service: Urology;  Laterality: Left;  30 MINS STENT "REPLACEMENT" [phone] MEDICARE-[id number]  . CYSTOSCOPY W/ URETERAL STENT PLACEMENT Left 04/22/2018   Procedure: CYSTOSCOPY WITH LEFT RETROGRADE PYELOGRAM/LEFT URETERAL STENT PLACEMENT, STENT EXCHANGE;  Surgeon: Cleon Gustin, MD;  Location: [facility];  Service: Urology;  Laterality: Left;  . CYSTOSCOPY W/ URETERAL STENT PLACEMENT Left 07/29/2018   Procedure: CYSTOSCOPY WITH RETROGRADE PYELOGRAM/URETERAL STENT EXCHANGE;  Surgeon: Cleon Gustin, MD;  Location: [facility];  Service: Urology;  Laterality: Left;  30 MINS  STENT EXCHANGE  . CYSTOSCOPY W/ URETERAL STENT PLACEMENT Left 11/04/2018   Procedure: CYSTOSCOPY WITH LEFT RETROGRADE PYELOGRAM; LEFT URETERAL STENT EXCHANGE;  Surgeon: Cleon Gustin, MD;  Location: [facility];  Service: Urology;  Laterality: Left;  . CYSTOSCOPY WITH FULGERATION  08/13/2017   Procedure: CYSTOSCOPY WITH FULGERATION OF BLADDER;  Surgeon: Cleon Gustin, MD;  Location: [facility];  Service: Urology;;  . Consuela Mimes WITH RETROGRADE PYELOGRAM, URETEROSCOPY AND STENT PLACEMENT Left 08/13/2017   Procedure: CYSTOSCOPY WITH LEFT RETROGRADE PYELOGRAM, LEFT URETEROSCOPY AND STENT REPLACEMENT;  Surgeon: Cleon Gustin, MD;  Location: [facility];  Service: Urology;  Laterality: Left;  . DESCENDING AORTIC ANEURYSM REPAIR W/  STENT     x2  . ESOPHAGOGASTRODUODENOSCOPY N/A 07/30/2016   Dr. Oneida Alar. Nonobstructing Schatzki ring at GE junction, multiple small sessile polyps with no bleeding or stigmata of recent bleeding in the gastric fundus and gastric body. Benign-appearing intrinsic moderate stenosis found the pylorus status post dilation. Small bowel capsule deployed.  Marland Kitchen GIVENS CAPSULE STUDY     Capsule study is complete to the cecum. No obvious lesions, mass, tumors. Small wisps of blood noted in small bowel secondary to EGD/dilation. Possible few erosions in setting of NSAIDs. No obvious areas of bleeding.  Marland Kitchen GIVENS CAPSULE STUDY N/A 11/29/2017   Dr. Laural Golden: no evidence of active bleeding in stomach or small bowel. Multiple gastric petechia, single small AVM in proximal small bowel, scattered petechia involving small bowel mucosa.   Marland Kitchen HERNIA REPAIR    . HOLMIUM LASER APPLICATION Left 1/95/0932   Procedure: HOLMIUM LASER LITHOTRIPSY OF ENCRUSTED LEFT URETERAL STENT;  Surgeon: Cleon Gustin, MD;  Location: [facility];  Service: Urology;  Laterality: Left;  . SPINE SURGERY     ruptured discs    Home Medications:  Current Facility-Administered Medications  Medication Dose Route Frequency Provider Last Rate Last Dose  . lactated ringers infusion   Intravenous Continuous Lenice Llamas, MD 50 mL/hr at 02/17/19 1155 1,000 mL at 02/17/19 1155   Allergies:  Allergies  Allergen Reactions  . Penicillins Rash and Other (See Comments)    Happened in [phone] Has patient had a PCN reaction causing immediate rash, facial/tongue/throat swelling, SOB or lightheaddness with hypotension: Yes - pt states he "just broke out a little but" after taking Penicillin Has patient had a PCN reaction causing severe rash involving mucus membranes or skin necrosis: Unknown Has patient had a PCN reaction that required hospitalization: No Has patient had a PCN reaction occurring within the last 10 years: No     Family History   Problem Relation Age of Onset  . Heart disease Other   . Diabetes Other   . Hypertension Other   . Liver cancer Mother   . Arthritis Mother   . Cancer Mother        died at 35  . Heart disease Mother   . Alzheimer's disease Father   . Cancer Brother        lung  . Cancer Son 62       lung  . Hepatitis Brother   . Alcohol abuse Brother   . Heart disease Son 6       heart attack, stents  . Diabetes Son   . Colon cancer Neg Hx    Social History:  reports that he quit smoking about 24 years ago. His smoking use included cigarettes. He started smoking about 67 years ago. He has a 135.00 pack-year smoking history. He has never used smokeless tobacco. He reports current alcohol use. He reports that he does not use drugs.  Review of Systems  All other systems reviewed and are negative.   Physical Exam:  Vital signs in last 24 hours: Temp:  [97.9 F (36.6 C)] 97.9 F (36.6 C) (02/19 1133) Pulse Rate:  [74] 74 (02/19 1133) Resp:  [18] 18 (02/19 1133) BP: (152)/(79)  152/79 (02/19 1133) SpO2:  [98 %] 98 % (02/19 1133) Physical Exam  Constitutional: He is oriented to person, place, and time. He appears well-developed and well-nourished.  HENT:  Head: Normocephalic and atraumatic.  Eyes: Pupils are equal, round, and reactive to light. EOM are normal.  Neck: Normal range of motion. No thyromegaly present.  Cardiovascular: Normal rate and regular rhythm.  Respiratory: Effort normal. No respiratory distress.  GI: Soft. He exhibits no distension.  Musculoskeletal: Normal range of motion.        General: No edema.  Neurological: He is alert and oriented to person, place, and time.  Skin: Skin is warm and dry.  Psychiatric: He has a normal mood and affect. His behavior is normal. Judgment and thought content normal.    Laboratory Data:  No results found for this or any previous visit (from the past 24 hour(s)). No results found for this or any previous visit (from the past 240  hour(s)). Creatinine: Recent Labs    02/11/19 1332  CREATININE 3.53*   Baseline Creatinine: 2  Impression/Assessment:  84yo with left ureteral stricture managed with indwelling ureteral stent  Plan:  The risks/benefits/alternatives to left ureteral stent exchange was explained to the patient and he understands and wishes to proceed with surgery  Nicolette Bang 02/17/2019, 12:25 PM

## 2019-02-17 NOTE — Anesthesia Postprocedure Evaluation (Signed)
Anesthesia Post Note  Patient: Andrew Zhang  Procedure(s) Performed: CYSTOSCOPY WITH RETROGRADE PYELOGRAM/URETERAL STENT exchange (Left Ureter)  Patient location during evaluation: PACU Anesthesia Type: General Level of consciousness: awake and alert Pain management: pain level controlled Vital Signs Assessment: post-procedure vital signs reviewed and stable Respiratory status: spontaneous breathing, nonlabored ventilation and respiratory function stable Cardiovascular status: blood pressure returned to baseline Postop Assessment: no apparent nausea or vomiting Anesthetic complications: no     Last Vitals:  Vitals:   02/17/19 1133 02/17/19 1337  BP: (!) 152/79 125/66  Pulse: 74 86  Resp: 18 11  Temp: 36.6 C (!) 36.4 C  SpO2: 98% 94%    Last Pain:  Vitals:   02/17/19 1337  TempSrc:   PainSc: 0-No pain                 Brandis Wixted J

## 2019-02-17 NOTE — Transfer of Care (Signed)
Immediate Anesthesia Transfer of Care Note  Patient: Andrew Zhang  Procedure(s) Performed: CYSTOSCOPY WITH RETROGRADE PYELOGRAM/URETERAL STENT exchange (Left Ureter)  Patient Location: PACU  Anesthesia Type:General  Level of Consciousness: Awake and alert  Airway & Oxygen Therapy: Patient Spontanous Breathing  Post-op Assessment: Report given to RN and Post -op Vital signs reviewed and stable  Post vital signs: Reviewed and stable  Last Vitals:  Vitals Value Taken Time  BP 125/66 02/17/2019  1:37 PM  Temp    Pulse 85 02/17/2019  1:39 PM  Resp 15 02/17/2019  1:39 PM  SpO2 93 % 02/17/2019  1:39 PM  Vitals shown include unvalidated device data.  Last Pain:  Vitals:   02/17/19 1133  TempSrc: Oral  PainSc: 0-No pain      Patients Stated Pain Goal: 6 (53/64/68 0321)  Complications: No apparent anesthesia complications

## 2019-02-18 ENCOUNTER — Encounter (HOSPITAL_COMMUNITY): Payer: Self-pay | Admitting: Urology

## 2019-02-25 DIAGNOSIS — Z0000 Encounter for general adult medical examination without abnormal findings: Secondary | ICD-10-CM | POA: Diagnosis not present

## 2019-02-25 DIAGNOSIS — I10 Essential (primary) hypertension: Secondary | ICD-10-CM | POA: Diagnosis not present

## 2019-02-25 DIAGNOSIS — M19211 Secondary osteoarthritis, right shoulder: Secondary | ICD-10-CM | POA: Diagnosis not present

## 2019-02-25 DIAGNOSIS — Z6823 Body mass index (BMI) 23.0-23.9, adult: Secondary | ICD-10-CM | POA: Diagnosis not present

## 2019-02-25 DIAGNOSIS — N184 Chronic kidney disease, stage 4 (severe): Secondary | ICD-10-CM | POA: Diagnosis not present

## 2019-03-04 ENCOUNTER — Ambulatory Visit (HOSPITAL_COMMUNITY): Payer: Medicare Other

## 2019-03-04 ENCOUNTER — Encounter (HOSPITAL_COMMUNITY): Payer: Self-pay

## 2019-03-11 DIAGNOSIS — Z79899 Other long term (current) drug therapy: Secondary | ICD-10-CM | POA: Diagnosis not present

## 2019-03-11 DIAGNOSIS — R809 Proteinuria, unspecified: Secondary | ICD-10-CM | POA: Diagnosis not present

## 2019-03-11 DIAGNOSIS — I10 Essential (primary) hypertension: Secondary | ICD-10-CM | POA: Diagnosis not present

## 2019-03-11 DIAGNOSIS — D509 Iron deficiency anemia, unspecified: Secondary | ICD-10-CM | POA: Diagnosis not present

## 2019-03-11 DIAGNOSIS — E559 Vitamin D deficiency, unspecified: Secondary | ICD-10-CM | POA: Diagnosis not present

## 2019-03-11 DIAGNOSIS — N183 Chronic kidney disease, stage 3 (moderate): Secondary | ICD-10-CM | POA: Diagnosis not present

## 2019-03-15 DIAGNOSIS — D500 Iron deficiency anemia secondary to blood loss (chronic): Secondary | ICD-10-CM | POA: Diagnosis not present

## 2019-03-17 ENCOUNTER — Ambulatory Visit (INDEPENDENT_AMBULATORY_CARE_PROVIDER_SITE_OTHER): Payer: Medicare Other | Admitting: Urology

## 2019-03-17 ENCOUNTER — Other Ambulatory Visit: Payer: Self-pay | Admitting: Urology

## 2019-03-17 ENCOUNTER — Ambulatory Visit (HOSPITAL_COMMUNITY)
Admission: RE | Admit: 2019-03-17 | Discharge: 2019-03-17 | Disposition: A | Discharge status: Home / Self Care | Payer: Medicare Other | Source: Ambulatory Visit | Attending: Urology | Admitting: Urology

## 2019-03-17 ENCOUNTER — Other Ambulatory Visit: Payer: Self-pay

## 2019-03-17 ENCOUNTER — Other Ambulatory Visit (HOSPITAL_COMMUNITY): Payer: Self-pay | Admitting: Urology

## 2019-03-17 DIAGNOSIS — N131 Hydronephrosis with ureteral stricture, not elsewhere classified: Secondary | ICD-10-CM | POA: Diagnosis not present

## 2019-03-17 DIAGNOSIS — N135 Crossing vessel and stricture of ureter without hydronephrosis: Secondary | ICD-10-CM

## 2019-03-17 DIAGNOSIS — N401 Enlarged prostate with lower urinary tract symptoms: Secondary | ICD-10-CM | POA: Diagnosis not present

## 2019-03-17 DIAGNOSIS — N1330 Unspecified hydronephrosis: Secondary | ICD-10-CM | POA: Diagnosis not present

## 2019-03-31 DIAGNOSIS — C61 Malignant neoplasm of prostate: Secondary | ICD-10-CM | POA: Diagnosis not present

## 2019-03-31 DIAGNOSIS — D500 Iron deficiency anemia secondary to blood loss (chronic): Secondary | ICD-10-CM | POA: Diagnosis not present

## 2019-05-13 DIAGNOSIS — D649 Anemia, unspecified: Secondary | ICD-10-CM | POA: Diagnosis not present

## 2019-05-13 DIAGNOSIS — D500 Iron deficiency anemia secondary to blood loss (chronic): Secondary | ICD-10-CM | POA: Diagnosis not present

## 2019-05-16 DIAGNOSIS — C61 Malignant neoplasm of prostate: Secondary | ICD-10-CM | POA: Diagnosis not present

## 2019-05-16 DIAGNOSIS — D500 Iron deficiency anemia secondary to blood loss (chronic): Secondary | ICD-10-CM | POA: Diagnosis not present

## 2019-05-31 DIAGNOSIS — D500 Iron deficiency anemia secondary to blood loss (chronic): Secondary | ICD-10-CM | POA: Diagnosis not present

## 2019-06-03 DIAGNOSIS — Z923 Personal history of irradiation: Secondary | ICD-10-CM | POA: Diagnosis not present

## 2019-06-03 DIAGNOSIS — Z66 Do not resuscitate: Secondary | ICD-10-CM | POA: Diagnosis not present

## 2019-06-03 DIAGNOSIS — Z951 Presence of aortocoronary bypass graft: Secondary | ICD-10-CM | POA: Diagnosis not present

## 2019-06-03 DIAGNOSIS — N200 Calculus of kidney: Secondary | ICD-10-CM | POA: Diagnosis not present

## 2019-06-03 DIAGNOSIS — Z79899 Other long term (current) drug therapy: Secondary | ICD-10-CM | POA: Diagnosis not present

## 2019-06-03 DIAGNOSIS — K219 Gastro-esophageal reflux disease without esophagitis: Secondary | ICD-10-CM | POA: Diagnosis not present

## 2019-06-03 DIAGNOSIS — C61 Malignant neoplasm of prostate: Secondary | ICD-10-CM | POA: Diagnosis not present

## 2019-06-03 DIAGNOSIS — D631 Anemia in chronic kidney disease: Secondary | ICD-10-CM | POA: Diagnosis not present

## 2019-06-03 DIAGNOSIS — Z6823 Body mass index (BMI) 23.0-23.9, adult: Secondary | ICD-10-CM | POA: Diagnosis not present

## 2019-06-03 DIAGNOSIS — D500 Iron deficiency anemia secondary to blood loss (chronic): Secondary | ICD-10-CM | POA: Diagnosis not present

## 2019-06-03 DIAGNOSIS — I129 Hypertensive chronic kidney disease with stage 1 through stage 4 chronic kidney disease, or unspecified chronic kidney disease: Secondary | ICD-10-CM | POA: Diagnosis not present

## 2019-06-03 DIAGNOSIS — I2510 Atherosclerotic heart disease of native coronary artery without angina pectoris: Secondary | ICD-10-CM | POA: Diagnosis not present

## 2019-06-03 DIAGNOSIS — N184 Chronic kidney disease, stage 4 (severe): Secondary | ICD-10-CM | POA: Diagnosis not present

## 2019-06-03 DIAGNOSIS — E1122 Type 2 diabetes mellitus with diabetic chronic kidney disease: Secondary | ICD-10-CM | POA: Diagnosis not present

## 2019-06-03 DIAGNOSIS — H8110 Benign paroxysmal vertigo, unspecified ear: Secondary | ICD-10-CM | POA: Diagnosis not present

## 2019-06-03 DIAGNOSIS — D649 Anemia, unspecified: Secondary | ICD-10-CM | POA: Diagnosis not present

## 2019-06-03 DIAGNOSIS — E785 Hyperlipidemia, unspecified: Secondary | ICD-10-CM | POA: Diagnosis not present

## 2019-06-03 DIAGNOSIS — Z87891 Personal history of nicotine dependence: Secondary | ICD-10-CM | POA: Diagnosis not present

## 2019-06-07 DIAGNOSIS — N184 Chronic kidney disease, stage 4 (severe): Secondary | ICD-10-CM | POA: Diagnosis not present

## 2019-06-21 DIAGNOSIS — D649 Anemia, unspecified: Secondary | ICD-10-CM | POA: Diagnosis not present

## 2019-06-21 DIAGNOSIS — D500 Iron deficiency anemia secondary to blood loss (chronic): Secondary | ICD-10-CM | POA: Diagnosis not present

## 2019-06-23 ENCOUNTER — Telehealth: Payer: Self-pay | Admitting: *Deleted

## 2019-06-23 NOTE — Telephone Encounter (Signed)
Patient verbally consented for telehealth visits with [facility] and understands that his insurance company will be billed for the encounter.   Aware to have vitals available 

## 2019-06-25 DIAGNOSIS — D500 Iron deficiency anemia secondary to blood loss (chronic): Secondary | ICD-10-CM | POA: Diagnosis not present

## 2019-06-25 DIAGNOSIS — N184 Chronic kidney disease, stage 4 (severe): Secondary | ICD-10-CM | POA: Diagnosis not present

## 2019-06-25 DIAGNOSIS — D631 Anemia in chronic kidney disease: Secondary | ICD-10-CM | POA: Diagnosis not present

## 2019-06-30 ENCOUNTER — Ambulatory Visit (INDEPENDENT_AMBULATORY_CARE_PROVIDER_SITE_OTHER): Payer: Medicare Other | Admitting: Urology

## 2019-06-30 DIAGNOSIS — N131 Hydronephrosis with ureteral stricture, not elsewhere classified: Secondary | ICD-10-CM | POA: Diagnosis not present

## 2019-06-30 DIAGNOSIS — N401 Enlarged prostate with lower urinary tract symptoms: Secondary | ICD-10-CM

## 2019-07-01 ENCOUNTER — Other Ambulatory Visit: Payer: Self-pay | Admitting: Urology

## 2019-07-07 ENCOUNTER — Telehealth (INDEPENDENT_AMBULATORY_CARE_PROVIDER_SITE_OTHER): Payer: Medicare Other | Admitting: Cardiovascular Disease

## 2019-07-07 ENCOUNTER — Encounter: Payer: Self-pay | Admitting: Cardiovascular Disease

## 2019-07-07 VITALS — BP 178/82 | HR 72 | Ht 74.0 in | Wt 165.0 lb

## 2019-07-07 DIAGNOSIS — E782 Mixed hyperlipidemia: Secondary | ICD-10-CM

## 2019-07-07 DIAGNOSIS — I714 Abdominal aortic aneurysm, without rupture, unspecified: Secondary | ICD-10-CM

## 2019-07-07 DIAGNOSIS — I6523 Occlusion and stenosis of bilateral carotid arteries: Secondary | ICD-10-CM

## 2019-07-07 DIAGNOSIS — I25768 Atherosclerosis of bypass graft of coronary artery of transplanted heart with other forms of angina pectoris: Secondary | ICD-10-CM

## 2019-07-07 DIAGNOSIS — I499 Cardiac arrhythmia, unspecified: Secondary | ICD-10-CM

## 2019-07-07 DIAGNOSIS — I10 Essential (primary) hypertension: Secondary | ICD-10-CM

## 2019-07-07 DIAGNOSIS — E785 Hyperlipidemia, unspecified: Secondary | ICD-10-CM

## 2019-07-07 MED ORDER — METOPROLOL SUCCINATE ER 25 MG PO TB24: 25.0000 mg | ORAL_TABLET | Freq: Every day | ORAL | 6 refills | Status: DC

## 2019-07-07 NOTE — Progress Notes (Signed)
Virtual Visit via Telephone Note   This visit type was conducted due to national recommendations for restrictions regarding the COVID-19 Pandemic (e.g. social distancing) in an effort to limit this patient's exposure and mitigate transmission in our community.  Due to his co-morbid illnesses, this patient is at least at moderate risk for complications without adequate follow up.  This format is felt to be most appropriate for this patient at this time.  The patient did not have access to video technology/had technical difficulties with video requiring transitioning to audio format only (telephone).  All issues noted in this document were discussed and addressed.  No physical exam could be performed with this format.  Please refer to the patient's chart for his  consent to telehealth for [facility].   Date:  07/07/2019   ID:  Andrew Zhang, DOB Jul 03, 1934, MRN [id number]  Patient Location: Home Provider Location: Office  PCP:  Sandi Mealy, MD  Cardiologist:  Kate Sable, MD  Electrophysiologist:  None   Evaluation Performed:  Follow-Up Visit  Chief Complaint:  CAD  History of Present Illness:    Andrew Zhang is a 85 y.o. male with a history of DM II, HTN, PVD, and CAD, with a h/o CABG in 02/1994 by Dr. Nils Pyle with a LIMA to LAD, SVG to D1, and SVG to RCA.   He also has a history of normocytic normochromic anemia likely multifactorial in etiology including iron deficiency, chronic kidney disease stage III, low testosterone, and GI bleeding.  He denies chest pain and shortness of breath. He gets IV iron transfusions. He's been mowing the grass and does gardening.  The patient does not have symptoms concerning for COVID-19 infection (fever, chills, cough, or new shortness of breath).    Past Medical History:  Diagnosis Date  . AAA (abdominal aortic aneurysm) ([place])   . Anemia   . Arthritis   . B12 deficiency   . Blood transfusion without reported diagnosis    . CAD (coronary artery disease)   . Cancer [facility])    radiation for prostate cancer and lupron.  . Cataract   . Complication of anesthesia    sts,"after hearat surgery my stomach didnt wake up like it was supossed to".  . Depression   . Diverticulitis   . DMII (diabetes mellitus, type 2) ([place]) 2008  . GERD (gastroesophageal reflux disease)   . GI bleed   . GIB (gastrointestinal bleeding) 09/18/2016  . Hiatal hernia   . History of kidney stones   . HTN (hypertension)   . Hyperlipidemia   . Iron deficiency anemia due to chronic blood loss 01/08/2017  . Kidney stone   . PONV (postoperative nausea and vomiting)   . Prostate cancer ([place])   . Sleep apnea    cannot tolerate, PCP aware  . Ureteral stricture, left    Past Surgical History:  Procedure Laterality Date  . APPENDECTOMY    . BACK SURGERY     x2  . CATARACT EXTRACTION Left   . CHOLECYSTECTOMY    . COLONOSCOPY  03/2015   Dr. Britta Mccreedy: Diverticulosis, single tubular adenoma removed.  . COLONOSCOPY WITH PROPOFOL N/A 02/25/2017   Dr. Oneida Alar: non-thrombosed external hemorrhoids, significant looping of left colon. severe diverticulosis in recto-sigmoid colon and sigmoid colon. 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 CORONARY ARTERY BYPASS GRAFT     1995  . CYSTOSCOPY     with stent-left kidney.  x2  . CYSTOSCOPY W/ URETERAL STENT PLACEMENT Left 05/14/2017   Procedure: CYSTOSCOPY WITH RETROGRADE PYELOGRAM/URETERAL STENT EXCHANGE;  Surgeon: Cleon Gustin, MD;  Location: [facility];  Service: Urology;  Laterality: Left;  . CYSTOSCOPY W/ URETERAL STENT PLACEMENT Left 10/13/2017   Procedure: CYSTOSCOPY WITH LEFT RETROGRADE PYELOGRAM/LEFT URETERAL STENT REPLACEMENT;  Surgeon: Cleon Gustin, MD;  Location: [facility];  Service: Urology;  Laterality: Left;  . CYSTOSCOPY W/ URETERAL STENT PLACEMENT Left 01/07/2018   Procedure: CYSTOSCOPY  WITH RETROGRADE PYELOGRAM/URETERAL STENT PLACEMENT;  Surgeon: Cleon Gustin, MD;  Location: [facility];  Service: Urology;  Laterality: Left;  30 MINS STENT "REPLACEMENT" [phone] MEDICARE-[id number]  . CYSTOSCOPY W/ URETERAL STENT PLACEMENT Left 04/22/2018   Procedure: CYSTOSCOPY WITH LEFT RETROGRADE PYELOGRAM/LEFT URETERAL STENT PLACEMENT, STENT EXCHANGE;  Surgeon: Cleon Gustin, MD;  Location: [facility];  Service: Urology;  Laterality: Left;  . CYSTOSCOPY W/ URETERAL STENT PLACEMENT Left 07/29/2018   Procedure: CYSTOSCOPY WITH RETROGRADE PYELOGRAM/URETERAL STENT EXCHANGE;  Surgeon: Cleon Gustin, MD;  Location: [facility];  Service: Urology;  Laterality: Left;  30 MINS  STENT EXCHANGE  . CYSTOSCOPY W/ URETERAL STENT PLACEMENT Left 11/04/2018   Procedure: CYSTOSCOPY WITH LEFT RETROGRADE PYELOGRAM; LEFT URETERAL STENT EXCHANGE;  Surgeon: Cleon Gustin, MD;  Location: [facility];  Service: Urology;  Laterality: Left;  . CYSTOSCOPY W/ URETERAL STENT PLACEMENT Left 02/17/2019   Procedure: CYSTOSCOPY WITH RETROGRADE PYELOGRAM/URETERAL STENT exchange;  Surgeon: Cleon Gustin, MD;  Location: [facility];  Service: Urology;  Laterality: Left;  30 MINS  . CYSTOSCOPY WITH FULGERATION  08/13/2017   Procedure: CYSTOSCOPY WITH FULGERATION OF BLADDER;  Surgeon: Cleon Gustin, MD;  Location: [facility];  Service: Urology;;  . Consuela Mimes WITH RETROGRADE PYELOGRAM, URETEROSCOPY AND STENT PLACEMENT Left 08/13/2017   Procedure: CYSTOSCOPY WITH LEFT RETROGRADE PYELOGRAM, LEFT URETEROSCOPY AND STENT REPLACEMENT;  Surgeon: Cleon Gustin, MD;  Location: [facility];  Service: Urology;  Laterality: Left;  . DESCENDING AORTIC ANEURYSM REPAIR W/ STENT     x2  . ESOPHAGOGASTRODUODENOSCOPY N/A 07/30/2016   Dr. Oneida Alar. Nonobstructing Schatzki ring at GE junction, multiple small sessile polyps with no bleeding or stigmata of recent bleeding in the gastric fundus and gastric body.  Benign-appearing intrinsic moderate stenosis found the pylorus status post dilation. Small bowel capsule deployed.  Marland Kitchen GIVENS CAPSULE STUDY     Capsule study is complete to the cecum. No obvious lesions, mass, tumors. Small wisps of blood noted in small bowel secondary to EGD/dilation. Possible few erosions in setting of NSAIDs. No obvious areas of bleeding.  Marland Kitchen GIVENS CAPSULE STUDY N/A 11/29/2017   Dr. Laural Golden: no evidence of active bleeding in stomach or small bowel. Multiple gastric petechia, single small AVM in proximal small bowel, scattered petechia involving small bowel mucosa.   Marland Kitchen HERNIA REPAIR    . HOLMIUM LASER APPLICATION Left 09/18/1659   Procedure: HOLMIUM LASER LITHOTRIPSY OF ENCRUSTED LEFT URETERAL STENT;  Surgeon: Cleon Gustin, MD;  Location: [facility];  Service: Urology;  Laterality: Left;  . SPINE SURGERY     ruptured discs     Current Meds  Medication Sig  . acetaminophen (TYLENOL) 325 MG tablet Take 325 mg by mouth every 8 (eight) hours as needed for moderate pain or headache.   . B Complex Vitamins (B COMPLEX-B12 PO) Take 0.5 drops by mouth daily as needed (for low iron/energy (0.5 dropperful)).   . finasteride (PROSCAR) 5 MG tablet Take 5 mg by mouth daily.  Marland Kitchen glimepiride (  AMARYL) 2 MG tablet Take 1 tablet (2 mg total) by mouth daily with breakfast.  . metoprolol succinate (TOPROL XL) 25 MG 24 hr tablet Take 1 tablet (25 mg total) by mouth daily. (Patient taking differently: Take 25 mg by mouth every evening. )  . nitroGLYCERIN (NITROSTAT) 0.4 MG SL tablet Place 1 tablet (0.4 mg total) under the tongue every 5 (five) minutes as needed for chest pain.  Marland Kitchen omeprazole (PRILOSEC) 20 MG capsule Take 20 mg by mouth every evening.  . sertraline (ZOLOFT) 100 MG tablet Take 50 mg by mouth daily.  . simvastatin (ZOCOR) 80 MG tablet Take 80 mg by mouth at bedtime.  . sodium bicarbonate 650 MG tablet Take 650 mg by mouth 2 (two) times daily.  . tamsulosin (FLOMAX) 0.4 MG CAPS Take 0.4  mg by mouth 2 (two) times daily.      Allergies:   Penicillins   Social History   Tobacco Use  . Smoking status: Former Smoker    Packs/day: 3.00    Years: 45.00    Pack years: 135.00    Types: Cigarettes    Start date: 04/19/1951    Quit date: 03/01/1994    Years since quitting: 25.3  . Smokeless tobacco: Never Used  Substance Use Topics  . Alcohol use: Yes    Alcohol/week: 0.0 standard drinks    Comment: 1-2 beers daily   . Drug use: No     Family Hx: The patient's family history includes Alcohol abuse in his brother; Alzheimer's disease in his father; Arthritis in his mother; Cancer in his brother and mother; Cancer (age of onset: 35) in his son; Diabetes in his son and another family member; Heart disease in his mother and another family member; Heart disease (age of onset: 60) in his son; Hepatitis in his brother; Hypertension in an other family member; Liver cancer in his mother. There is no history of Colon cancer.  ROS:   Please see the history of present illness.     All other systems reviewed and are negative.   Prior CV studies:   The following studies were reviewed today:  Nuclear stress test on 09/14/14 demonstrated myocardial scar in the inferior wall extending from the apex to the base with no evidence of ischemia. Calculated LVEF 50%.  Labs/Other Tests and Data Reviewed:    EKG:  No ECG reviewed.  Recent Labs: 10/30/2018: Hemoglobin 11.0; Platelets 174 02/11/2019: BUN 60; Creatinine, Ser 3.53; Potassium 5.0; Sodium 141   Recent Lipid Panel Lab Results  Component Value Date/Time   CHOL 135 02/24/2018 02:06 PM   TRIG 359 (H) 02/24/2018 02:06 PM   HDL 29 (L) 02/24/2018 02:06 PM   CHOLHDL 4.7 02/24/2018 02:06 PM   LDLCALC 63 02/24/2018 02:06 PM    Wt Readings from Last 3 Encounters:  07/07/19 165 lb (74.8 kg)  02/11/19 174 lb (78.9 kg)  12/03/18 174 lb (78.9 kg)     Objective:    Vital Signs:  BP (!) 178/82   Pulse 72   Ht 6\' 2"  (1.88 m)   Wt  165 lb (74.8 kg)   BMI 21.18 kg/m    VITAL SIGNS:  reviewed  ASSESSMENT & PLAN:    1. CAD s/p 3-vessel CABG: No ischemia by stress testing in September 2015. Symptomatically stable. Continue present medication regimen simvastatin.  He is not on aspirin due to anemia. I will aim to control BP. Continue Toprol-XL 25 mg daily.  2. Essential HTN: Blood pressure is  elevated. I will start amlodipine 5 mg daily.  3. Hyperlipidemia: Continue simvastatin 80 mg.  4. PVD: He had been following with Dr. Sammuel Hines at [facility]. I previously encouraged the patient and his wife to call him to make an appointment. He needs follow up for EVAR (2011). I will obtain carotid Dopplers.  5.  Arrhythmia: Has a h/o frequent PVCs in a pattern of ventricular bigeminy. On a beta blocker.   COVID-19 Education: The signs and symptoms of COVID-19 were discussed with the patient and how to seek care for testing (follow up with PCP or arrange E-visit).  The importance of social distancing was discussed today.  Time:   Today, I have spent 25 minutes with the patient with telehealth technology discussing the above problems.     Medication Adjustments/Labs and Tests Ordered: Current medicines are reviewed at length with the patient today.  Concerns regarding medicines are outlined above.   Tests Ordered: No orders of the defined types were placed in this encounter.   Medication Changes: No orders of the defined types were placed in this encounter.   Follow Up:  Virtual Visit or In Person in 6 month(s)  Signed, Kate Sable, MD  07/07/2019 2:09 PM    [place]

## 2019-07-08 MED ORDER — AMLODIPINE BESYLATE 5 MG PO TABS: 5.0000 mg | ORAL_TABLET | Freq: Every day | ORAL | 6 refills | Status: AC

## 2019-07-08 NOTE — Addendum Note (Signed)
Addended by: Laurine Blazer on: 07/08/2019 09:25 AM   Modules accepted: Orders

## 2019-07-08 NOTE — Patient Instructions (Addendum)
Medication Instructions:   Begin Amlodipine 5mg  daily. (blood pressure)  New prescription sent to [facility] [facility] today.   Continue all other medications.    Labwork: none  Testing/Procedures:  Your physician has requested that you have a carotid duplex. This test is an ultrasound of the carotid arteries in your neck. It looks at blood flow through these arteries that supply the brain with blood. Allow one hour for this exam. There are no restrictions or special instructions.  Office will contact with results via phone or letter.    Follow-Up: Your physician wants you to follow up in: 6 months.  You will receive a reminder letter in the mail one-two months in advance.  If you don't receive a letter, please call our office to schedule the follow up appointment   Any Other Special Instructions Will Be Listed Below (If Applicable). Please remember to follow up with Dr. Sammuel Hines at [facility] for your abdominal aneurysm repair.    If you need a refill on your cardiac medications before your next appointment, please call your pharmacy.

## 2019-07-21 ENCOUNTER — Other Ambulatory Visit: Payer: Self-pay

## 2019-07-21 ENCOUNTER — Ambulatory Visit (INDEPENDENT_AMBULATORY_CARE_PROVIDER_SITE_OTHER): Payer: Medicare Other

## 2019-07-21 DIAGNOSIS — I6523 Occlusion and stenosis of bilateral carotid arteries: Secondary | ICD-10-CM

## 2019-07-30 NOTE — Patient Instructions (Signed)
Andrew Zhang  07/30/2019     @PREFPERIOPPHARMACY @   Your procedure is scheduled on  08/04/2019   Report to [facility] at  1045  A.M.  Call this number if you have problems the morning of surgery:  [phone]   Remember:  Do not eat or drink after midnight.                        Take these medicines the morning of surgery with A SIP OF WATER  Amlodipine, metoprolol, omeprazole, zoloft, flomax.    Do not wear jewelry, make-up or nail polish.  Do not wear lotions, powders, or perfumes, or deodorant.  Do not shave 48 hours prior to surgery.  Men may shave face and neck.  Do not bring valuables to the hospital.  [facility] is not responsible for any belongings or valuables.  Contacts, dentures or bridgework may not be worn into surgery.  Leave your suitcase in the car.  After surgery it may be brought to your room.  For patients admitted to the hospital, discharge time will be determined by your treatment team.  Patients discharged the day of surgery will not be allowed to drive home.   Name and phone number of your driver:   family Special instructions:  None  Please read over the following fact sheets that you were given. Anesthesia Post-op Instructions and Care and Recovery After Surgery       Cystoscopy Cystoscopy is a procedure that is used to help diagnose and sometimes treat conditions that affect the lower urinary tract. The lower urinary tract includes the bladder and the urethra. The urethra is the tube that drains urine from the bladder. Cystoscopy is done using a thin, tube-shaped instrument with a light and camera at the end (cystoscope). The cystoscope may be hard or flexible, depending on the goal of the procedure. The cystoscope is inserted through the urethra, into the bladder. Cystoscopy may be recommended if you have:  Urinary tract infections that keep coming back.  Blood in the urine (hematuria).  An inability to control when you urinate  (urinary incontinence) or an overactive bladder.  Unusual cells found in a urine sample.  A blockage in the urethra, such as a urinary stone.  Painful urination.  An abnormality in the bladder found during an intravenous pyelogram (IVP) or CT scan. Cystoscopy may also be done to remove a sample of tissue to be examined under a microscope (biopsy). Tell a health care provider about:  Any allergies you have.  All medicines you are taking, including vitamins, herbs, eye drops, creams, and over-the-counter medicines.  Any problems you or family members have had with anesthetic medicines.  Any blood disorders you have.  Any surgeries you have had.  Any medical conditions you have.  Whether you are pregnant or may be pregnant. What are the risks? Generally, this is a safe procedure. However, problems may occur, including:  Infection.  Bleeding.  Allergic reactions to medicines.  Damage to other structures or organs. What happens before the procedure?  Ask your health care provider about: ? Changing or stopping your regular medicines. This is especially important if you are taking diabetes medicines or blood thinners. ? Taking medicines such as aspirin and ibuprofen. These medicines can thin your blood. Do not take these medicines unless your health care provider tells you to take them. ? Taking over-the-counter medicines, vitamins, herbs, and supplements.  Follow  instructions from your health care provider about eating or drinking restrictions.  Ask your health care provider what steps will be taken to help prevent infection. These may include: ? Washing skin with a germ-killing soap. ? Taking antibiotic medicine.  You may have an exam or testing, such as: ? X-rays of the bladder, urethra, or kidneys. ? Urine tests to check for signs of infection.  Plan to have someone take you home from the hospital or clinic. What happens during the procedure?   You will be given  one or more of the following: ? A medicine to help you relax (sedative). ? A medicine to numb the area (local anesthetic).  The area around the opening of your urethra will be cleaned.  The cystoscope will be passed through your urethra into your bladder.  Germ-free (sterile) fluid will flow through the cystoscope to fill your bladder. The fluid will stretch your bladder so that your health care provider can clearly examine your bladder walls.  Your doctor will look at the urethra and bladder. Your doctor may take a biopsy or remove stones.  The cystoscope will be removed, and your bladder will be emptied. The procedure may vary among health care providers and hospitals. What can I expect after the procedure? After the procedure, it is common to have:  Some soreness or pain in your abdomen and urethra.  Urinary symptoms. These include: ? Mild pain or burning when you urinate. Pain should stop within a few minutes after you urinate. This may last for up to 1 week. ? A small amount of blood in your urine for several days. ? Feeling like you need to urinate but producing only a small amount of urine. Follow these instructions at home: Medicines  Take over-the-counter and prescription medicines only as told by your health care provider.  If you were prescribed an antibiotic medicine, take it as told by your health care provider. Do not stop taking the antibiotic even if you start to feel better. General instructions  Return to your normal activities as told by your health care provider. Ask your health care provider what activities are safe for you.  Do not drive for 24 hours if you were given a sedative during your procedure.  Watch for any blood in your urine. If the amount of blood in your urine increases, call your health care provider.  Follow instructions from your health care provider about eating or drinking restrictions.  If a tissue sample was removed for testing (biopsy)  during your procedure, it is up to you to get your test results. Ask your health care provider, or the department that is doing the test, when your results will be ready.  Drink enough fluid to keep your urine pale yellow.  Keep all follow-up visits as told by your health care provider. This is important. Contact a health care provider if you:  Have pain that gets worse or does not get better with medicine, especially pain when you urinate.  Have trouble urinating.  Have more blood in your urine. Get help right away if you:  Have blood clots in your urine.  Have abdominal pain.  Have a fever or chills.  Are unable to urinate. Summary  Cystoscopy is a procedure that is used to help diagnose and sometimes treat conditions that affect the lower urinary tract.  Cystoscopy is done using a thin, tube-shaped instrument with a light and camera at the end.  After the procedure, it is common to  have some soreness or pain in your abdomen and urethra.  Watch for any blood in your urine. If the amount of blood in your urine increases, call your health care provider.  If you were prescribed an antibiotic medicine, take it as told by your health care provider. Do not stop taking the antibiotic even if you start to feel better. This information is not intended to replace advice given to you by your health care provider. Make sure you discuss any questions you have with your health care provider. Document Released: 12/13/2000 Document Revised: 12/08/2018 Document Reviewed: 12/08/2018 Elsevier Patient Education  2020 [employer]. How to Use Chlorhexidine for Bathing Chlorhexidine gluconate (CHG) is a germ-killing (antiseptic) solution that is used to clean the skin. It can get rid of the bacteria that normally live on the skin and can keep them away for about 24 hours. To clean your skin with CHG, you may be given:  A CHG solution to use in the shower or as part of a sponge bath.  A  prepackaged cloth that contains CHG. Cleaning your skin with CHG may help lower the risk for infection:  While you are staying in the intensive care unit of the hospital.  If you have a vascular access, such as a central line, to provide short-term or long-term access to your veins.  If you have a catheter to drain urine from your bladder.  If you are on a ventilator. A ventilator is a machine that helps you breathe by moving air in and out of your lungs.  After surgery. What are the risks? Risks of using CHG include:  A skin reaction.  Hearing loss, if CHG gets in your ears.  Eye injury, if CHG gets in your eyes and is not rinsed out.  The CHG product catching fire. Make sure that you avoid smoking and flames after applying CHG to your skin. Do not use CHG:  If you have a chlorhexidine allergy or have previously reacted to chlorhexidine.  On babies younger than 67 months of age. How to use CHG solution  Use CHG only as told by your health care provider, and follow the instructions on the label.  Use the full amount of CHG as directed. Usually, this is one bottle. During a shower Follow these steps when using CHG solution during a shower (unless your health care provider gives you different instructions): 1. Start the shower. 2. Use your normal soap and shampoo to wash your face and hair. 3. Turn off the shower or move out of the shower stream. 4. Pour the CHG onto a clean washcloth. Do not use any type of brush or rough-edged sponge. 5. Starting at your neck, lather your body down to your toes. Make sure you follow these instructions: ? If you will be having surgery, pay special attention to the part of your body where you will be having surgery. Scrub this area for at least 1 minute. ? Do not use CHG on your head or face. If the solution gets into your ears or eyes, rinse them well with water. ? Avoid your genital area. ? Avoid any areas of skin that have broken skin,  cuts, or scrapes. ? Scrub your back and under your arms. Make sure to wash skin folds. 6. Let the lather sit on your skin for 1-2 minutes or as long as told by your health care provider. 7. Thoroughly rinse your entire body in the shower. Make sure that all body creases and  crevices are rinsed well. 8. Dry off with a clean towel. Do not put any substances on your body afterward-such as powder, lotion, or perfume-unless you are told to do so by your health care provider. Only use lotions that are recommended by the manufacturer. 9. Put on clean clothes or pajamas. 10. If it is the night before your surgery, sleep in clean sheets.  During a sponge bath Follow these steps when using CHG solution during a sponge bath (unless your health care provider gives you different instructions): 1. Use your normal soap and shampoo to wash your face and hair. 2. Pour the CHG onto a clean washcloth. 3. Starting at your neck, lather your body down to your toes. Make sure you follow these instructions: ? If you will be having surgery, pay special attention to the part of your body where you will be having surgery. Scrub this area for at least 1 minute. ? Do not use CHG on your head or face. If the solution gets into your ears or eyes, rinse them well with water. ? Avoid your genital area. ? Avoid any areas of skin that have broken skin, cuts, or scrapes. ? Scrub your back and under your arms. Make sure to wash skin folds. 4. Let the lather sit on your skin for 1-2 minutes or as long as told by your health care provider. 5. Using a different clean, wet washcloth, thoroughly rinse your entire body. Make sure that all body creases and crevices are rinsed well. 6. Dry off with a clean towel. Do not put any substances on your body afterward-such as powder, lotion, or perfume-unless you are told to do so by your health care provider. Only use lotions that are recommended by the manufacturer. 7. Put on clean clothes or  pajamas. 8. If it is the night before your surgery, sleep in clean sheets. How to use CHG prepackaged cloths  Only use CHG cloths as told by your health care provider, and follow the instructions on the label.  Use the CHG cloth on clean, dry skin.  Do not use the CHG cloth on your head or face unless your health care provider tells you to.  When washing with the CHG cloth: ? Avoid your genital area. ? Avoid any areas of skin that have broken skin, cuts, or scrapes. Before surgery Follow these steps when using a CHG cloth to clean before surgery (unless your health care provider gives you different instructions): 1. Using the CHG cloth, vigorously scrub the part of your body where you will be having surgery. Scrub using a back-and-forth motion for 3 minutes. The area on your body should be completely wet with CHG when you are done scrubbing. 2. Do not rinse. Discard the cloth and let the area air-dry. Do not put any substances on the area afterward, such as powder, lotion, or perfume. 3. Put on clean clothes or pajamas. 4. If it is the night before your surgery, sleep in clean sheets.  For general bathing Follow these steps when using CHG cloths for general bathing (unless your health care provider gives you different instructions). 1. Use a separate CHG cloth for each area of your body. Make sure you wash between any folds of skin and between your fingers and toes. Wash your body in the following order, switching to a new cloth after each step: ? The front of your neck, shoulders, and chest. ? Both of your arms, under your arms, and your hands. ? Your stomach  and groin area, avoiding the genitals. ? Your right leg and foot. ? Your left leg and foot. ? The back of your neck, your back, and your buttocks. 2. Do not rinse. Discard the cloth and let the area air-dry. Do not put any substances on your body afterward-such as powder, lotion, or perfume-unless you are told to do so by your  health care provider. Only use lotions that are recommended by the manufacturer. 3. Put on clean clothes or pajamas. Contact a health care provider if:  Your skin gets irritated after scrubbing.  You have questions about using your solution or cloth. Get help right away if:  Your eyes become very red or swollen.  Your eyes itch badly.  Your skin itches badly and is red or swollen.  Your hearing changes.  You have trouble seeing.  You have swelling or tingling in your mouth or throat.  You have trouble breathing.  You swallow any chlorhexidine. Summary  Chlorhexidine gluconate (CHG) is a germ-killing (antiseptic) solution that is used to clean the skin. Cleaning your skin with CHG may help to lower your risk for infection.  You may be given CHG to use for bathing. It may be in a bottle or in a prepackaged cloth to use on your skin. Carefully follow your health care provider's instructions and the instructions on the product label.  Do not use CHG if you have a chlorhexidine allergy.  Contact your health care provider if your skin gets irritated after scrubbing. This information is not intended to replace advice given to you by your health care provider. Make sure you discuss any questions you have with your health care provider. Document Released: 09/09/2012 Document Revised: 03/04/2019 Document Reviewed: 11/13/2017 Elsevier Patient Education  2020 [employer].

## 2019-08-02 ENCOUNTER — Other Ambulatory Visit: Payer: Self-pay

## 2019-08-02 ENCOUNTER — Other Ambulatory Visit (HOSPITAL_COMMUNITY)
Admission: RE | Admit: 2019-08-02 | Discharge: 2019-08-02 | Disposition: A | Discharge status: Home / Self Care | Payer: Medicare Other | Source: Ambulatory Visit | Attending: Urology | Admitting: Urology

## 2019-08-02 ENCOUNTER — Encounter (HOSPITAL_COMMUNITY)
Admission: RE | Admit: 2019-08-02 | Discharge: 2019-08-02 | Disposition: A | Discharge status: Home / Self Care | Payer: Medicare Other | Source: Ambulatory Visit | Attending: Urology | Admitting: Urology

## 2019-08-02 DIAGNOSIS — N131 Hydronephrosis with ureteral stricture, not elsewhere classified: Secondary | ICD-10-CM | POA: Diagnosis not present

## 2019-08-02 DIAGNOSIS — Z87891 Personal history of nicotine dependence: Secondary | ICD-10-CM | POA: Diagnosis not present

## 2019-08-02 DIAGNOSIS — N135 Crossing vessel and stricture of ureter without hydronephrosis: Secondary | ICD-10-CM | POA: Diagnosis present

## 2019-08-02 DIAGNOSIS — K219 Gastro-esophageal reflux disease without esophagitis: Secondary | ICD-10-CM | POA: Diagnosis not present

## 2019-08-02 DIAGNOSIS — Z20828 Contact with and (suspected) exposure to other viral communicable diseases: Secondary | ICD-10-CM | POA: Diagnosis not present

## 2019-08-02 DIAGNOSIS — Z951 Presence of aortocoronary bypass graft: Secondary | ICD-10-CM | POA: Diagnosis not present

## 2019-08-02 DIAGNOSIS — Z8546 Personal history of malignant neoplasm of prostate: Secondary | ICD-10-CM | POA: Diagnosis not present

## 2019-08-02 DIAGNOSIS — Z923 Personal history of irradiation: Secondary | ICD-10-CM | POA: Diagnosis not present

## 2019-08-02 DIAGNOSIS — I2510 Atherosclerotic heart disease of native coronary artery without angina pectoris: Secondary | ICD-10-CM | POA: Diagnosis not present

## 2019-08-02 DIAGNOSIS — E1151 Type 2 diabetes mellitus with diabetic peripheral angiopathy without gangrene: Secondary | ICD-10-CM | POA: Diagnosis not present

## 2019-08-02 DIAGNOSIS — I10 Essential (primary) hypertension: Secondary | ICD-10-CM | POA: Diagnosis not present

## 2019-08-02 DIAGNOSIS — G4730 Sleep apnea, unspecified: Secondary | ICD-10-CM | POA: Diagnosis not present

## 2019-08-02 LAB — HEMOGLOBIN A1C
Hgb A1c MFr Bld: 5.9 % — ABNORMAL HIGH (ref 4.8–5.6)
Mean Plasma Glucose: 122.63 mg/dL

## 2019-08-02 LAB — BASIC METABOLIC PANEL
Anion gap: 9 (ref 5–15)
BUN: 55 mg/dL — ABNORMAL HIGH (ref 8–23)
CO2: 23 mmol/L (ref 22–32)
Calcium: 8.9 mg/dL (ref 8.9–10.3)
Chloride: 111 mmol/L (ref 98–111)
Creatinine, Ser: 4.2 mg/dL — ABNORMAL HIGH (ref 0.61–1.24)
GFR calc Af Amer: 14 mL/min — ABNORMAL LOW (ref >60)
GFR calc non Af Amer: 12 mL/min — ABNORMAL LOW (ref >60)
Glucose, Bld: 168 mg/dL — ABNORMAL HIGH (ref 70–99)
Potassium: 4.6 mmol/L (ref 3.5–5.1)
Sodium: 143 mmol/L (ref 135–145)

## 2019-08-02 LAB — GLUCOSE, CAPILLARY: Glucose-Capillary: 151 mg/dL — ABNORMAL HIGH (ref 70–99)

## 2019-08-02 LAB — SARS CORONAVIRUS 2 (TAT 6-24 HRS): SARS Coronavirus 2: NEGATIVE

## 2019-08-03 ENCOUNTER — Telehealth: Payer: Self-pay | Admitting: *Deleted

## 2019-08-03 NOTE — Telephone Encounter (Signed)
Notes recorded by Laurine Blazer, LPN on 0/0/9381 at 82:99 AM EDT  Patient notified and verbalized understanding. Copy to pmd.    ------   Notes recorded by Arnoldo Lenis, MD on 07/23/2019 at 10:31 AM EDT  Carotid US shows just mild bilateral blockages, continue to monitor    Zandra Abts MD

## 2019-08-04 ENCOUNTER — Ambulatory Visit (HOSPITAL_COMMUNITY): Payer: Medicare Other | Admitting: Anesthesiology

## 2019-08-04 ENCOUNTER — Ambulatory Visit (HOSPITAL_COMMUNITY): Payer: Medicare Other

## 2019-08-04 ENCOUNTER — Encounter (HOSPITAL_COMMUNITY): Payer: Self-pay | Admitting: *Deleted

## 2019-08-04 ENCOUNTER — Ambulatory Visit (HOSPITAL_COMMUNITY)
Admission: RE | Admit: 2019-08-04 | Discharge: 2019-08-04 | Disposition: A | Discharge status: Home / Self Care | Payer: Medicare Other | Attending: Urology | Admitting: Urology

## 2019-08-04 ENCOUNTER — Other Ambulatory Visit: Payer: Self-pay

## 2019-08-04 ENCOUNTER — Encounter (HOSPITAL_COMMUNITY)
Admission: RE | Disposition: A | Discharge status: Home / Self Care | Payer: Self-pay | Source: Home / Self Care | Attending: Urology

## 2019-08-04 DIAGNOSIS — Z951 Presence of aortocoronary bypass graft: Secondary | ICD-10-CM | POA: Diagnosis not present

## 2019-08-04 DIAGNOSIS — I2510 Atherosclerotic heart disease of native coronary artery without angina pectoris: Secondary | ICD-10-CM | POA: Diagnosis not present

## 2019-08-04 DIAGNOSIS — Z87891 Personal history of nicotine dependence: Secondary | ICD-10-CM | POA: Insufficient documentation

## 2019-08-04 DIAGNOSIS — G4730 Sleep apnea, unspecified: Secondary | ICD-10-CM | POA: Diagnosis not present

## 2019-08-04 DIAGNOSIS — K219 Gastro-esophageal reflux disease without esophagitis: Secondary | ICD-10-CM | POA: Diagnosis not present

## 2019-08-04 DIAGNOSIS — Z8546 Personal history of malignant neoplasm of prostate: Secondary | ICD-10-CM | POA: Insufficient documentation

## 2019-08-04 DIAGNOSIS — Z923 Personal history of irradiation: Secondary | ICD-10-CM | POA: Diagnosis not present

## 2019-08-04 DIAGNOSIS — I10 Essential (primary) hypertension: Secondary | ICD-10-CM | POA: Insufficient documentation

## 2019-08-04 DIAGNOSIS — N131 Hydronephrosis with ureteral stricture, not elsewhere classified: Secondary | ICD-10-CM | POA: Diagnosis not present

## 2019-08-04 DIAGNOSIS — N135 Crossing vessel and stricture of ureter without hydronephrosis: Secondary | ICD-10-CM | POA: Diagnosis not present

## 2019-08-04 DIAGNOSIS — Z20828 Contact with and (suspected) exposure to other viral communicable diseases: Secondary | ICD-10-CM | POA: Diagnosis not present

## 2019-08-04 DIAGNOSIS — E1151 Type 2 diabetes mellitus with diabetic peripheral angiopathy without gangrene: Secondary | ICD-10-CM | POA: Insufficient documentation

## 2019-08-04 HISTORY — PX: CYSTOSCOPY W/ URETERAL STENT PLACEMENT: SHX1429

## 2019-08-04 LAB — GLUCOSE, CAPILLARY
Glucose-Capillary: 102 mg/dL — ABNORMAL HIGH (ref 70–99)
Glucose-Capillary: 109 mg/dL — ABNORMAL HIGH (ref 70–99)

## 2019-08-04 SURGERY — CYSTOSCOPY, WITH RETROGRADE PYELOGRAM AND URETERAL STENT INSERTION
Anesthesia: General | Laterality: Left

## 2019-08-04 MED ORDER — PROPOFOL 10 MG/ML IV BOLUS
INTRAVENOUS | Status: AC
  Filled 2019-08-04: qty 20

## 2019-08-04 MED ORDER — CEFAZOLIN SODIUM-DEXTROSE 2-4 GM/100ML-% IV SOLN
2.0000 g | INTRAVENOUS | Status: AC
  Administered 2019-08-04: 14:00:00 2 g via INTRAVENOUS
  Filled 2019-08-04: qty 100

## 2019-08-04 MED ORDER — DIATRIZOATE MEGLUMINE 30 % UR SOLN
URETHRAL | Status: DC | PRN
  Administered 2019-08-04: 14:00:00 10 mL via URETHRAL

## 2019-08-04 MED ORDER — FENTANYL CITRATE (PF) 100 MCG/2ML IJ SOLN: 25.0000 ug | INTRAMUSCULAR | Status: DC | PRN

## 2019-08-04 MED ORDER — STERILE WATER FOR IRRIGATION IR SOLN
Status: DC | PRN
  Administered 2019-08-04: 500 mL

## 2019-08-04 MED ORDER — FENTANYL CITRATE (PF) 100 MCG/2ML IJ SOLN
INTRAMUSCULAR | Status: DC | PRN
  Administered 2019-08-04: 50 ug via INTRAVENOUS

## 2019-08-04 MED ORDER — PROPOFOL 10 MG/ML IV BOLUS
INTRAVENOUS | Status: DC | PRN
  Administered 2019-08-04: 120 mg via INTRAVENOUS

## 2019-08-04 MED ORDER — FENTANYL CITRATE (PF) 100 MCG/2ML IJ SOLN
INTRAMUSCULAR | Status: AC
  Filled 2019-08-04: qty 2

## 2019-08-04 MED ORDER — DIATRIZOATE MEGLUMINE 30 % UR SOLN
URETHRAL | Status: AC
  Filled 2019-08-04: qty 100

## 2019-08-04 MED ORDER — ONDANSETRON HCL 4 MG/2ML IJ SOLN: 4.0000 mg | Freq: Once | INTRAMUSCULAR | Status: DC | PRN

## 2019-08-04 MED ORDER — LACTATED RINGERS IV SOLN: Freq: Once | INTRAVENOUS | Status: DC

## 2019-08-04 MED ORDER — SODIUM CHLORIDE 0.9 % IR SOLN
Status: DC | PRN
  Administered 2019-08-04: 3000 mL

## 2019-08-04 MED ORDER — LACTATED RINGERS IV SOLN
INTRAVENOUS | Status: DC | PRN
  Administered 2019-08-04: 13:00:00 via INTRAVENOUS

## 2019-08-04 SURGICAL SUPPLY — 20 items
BAG DRAIN URO TABLE W/ADPT NS (BAG) ×3 IMPLANT
CATH INTERMIT  6FR 70CM (CATHETERS) ×3 IMPLANT
CLOTH BEACON ORANGE TIMEOUT ST (SAFETY) ×3 IMPLANT
DECANTER SPIKE VIAL GLASS SM (MISCELLANEOUS) ×3 IMPLANT
GLOVE BIO SURGEON STRL SZ8 (GLOVE) ×3 IMPLANT
GLOVE BIOGEL PI IND STRL 7.0 (GLOVE) ×2 IMPLANT
GLOVE BIOGEL PI INDICATOR 7.0 (GLOVE) ×4
GOWN STRL REUS W/TWL LRG LVL3 (GOWN DISPOSABLE) ×3 IMPLANT
GOWN STRL REUS W/TWL XL LVL3 (GOWN DISPOSABLE) ×3 IMPLANT
GUIDEWIRE STR ZIPWIRE 035X150 (MISCELLANEOUS) ×3 IMPLANT
IV NS IRRIG 3000ML ARTHROMATIC (IV SOLUTION) ×3 IMPLANT
KIT TURNOVER CYSTO (KITS) ×3 IMPLANT
MANIFOLD NEPTUNE II (INSTRUMENTS) ×3 IMPLANT
PACK CYSTO (CUSTOM PROCEDURE TRAY) ×3 IMPLANT
PAD ARMBOARD 7.5X6 YLW CONV (MISCELLANEOUS) ×3 IMPLANT
STENT CONTOUR 7FRX24 (STENTS) ×3 IMPLANT
STENT URET 6FRX26 CONTOUR (STENTS) IMPLANT
SYR 10ML LL (SYRINGE) ×3 IMPLANT
TOWEL OR 17X26 4PK STRL BLUE (TOWEL DISPOSABLE) ×3 IMPLANT
WATER STERILE IRR 500ML POUR (IV SOLUTION) ×3 IMPLANT

## 2019-08-04 NOTE — Anesthesia Procedure Notes (Signed)
Procedure Name: LMA Insertion Date/Time: 08/04/2019 2:07 PM Performed by: Ollen Bowl, CRNA Pre-anesthesia Checklist: Patient identified, Patient being monitored, Emergency Drugs available, Timeout performed and Suction available Patient Re-evaluated:Patient Re-evaluated prior to induction Oxygen Delivery Method: Circle System Utilized Preoxygenation: Pre-oxygenation with 100% oxygen Induction Type: IV induction Ventilation: Mask ventilation without difficulty LMA: LMA inserted LMA Size: 5.0 Number of attempts: 1 Placement Confirmation: positive ETCO2 and breath sounds checked- equal and bilateral

## 2019-08-04 NOTE — H&P (Signed)
Urology Admission H&P  Chief Complaint: left ureteral stricture  History of Present Illness: Mr Rock is a 85yo with a hx of a left ureteral stricture managed with an indwelling ureteral stent. He denies any hematuria. No dysuria. He has urinary frequency and urgency which is stable. No fevers/chills/sweats. No nausea/vomititng  Past Medical History:  Diagnosis Date  . AAA (abdominal aortic aneurysm) ([place])   . Anemia   . Arthritis   . B12 deficiency   . Blood transfusion without reported diagnosis   . CAD (coronary artery disease)   . Cancer [facility])    radiation for prostate cancer and lupron.  . Cataract   . Complication of anesthesia    sts,"after hearat surgery my stomach didnt wake up like it was supossed to".  . Depression   . Diverticulitis   . DMII (diabetes mellitus, type 2) ([place]) 2008  . GERD (gastroesophageal reflux disease)   . GI bleed   . GIB (gastrointestinal bleeding) 09/18/2016  . Hiatal hernia   . History of kidney stones   . HTN (hypertension)   . Hyperlipidemia   . Iron deficiency anemia due to chronic blood loss 01/08/2017  . Kidney stone   . PONV (postoperative nausea and vomiting)   . Prostate cancer ([place])   . Sleep apnea    cannot tolerate, PCP aware  . Ureteral stricture, left    Past Surgical History:  Procedure Laterality Date  . APPENDECTOMY    . BACK SURGERY     x2  . CATARACT EXTRACTION Left   . CHOLECYSTECTOMY    . COLONOSCOPY  03/2015   Dr. Britta Mccreedy: Diverticulosis, single tubular adenoma removed.  . COLONOSCOPY WITH PROPOFOL N/A 02/25/2017   Dr. Oneida Alar: non-thrombosed external hemorrhoids, significant looping of left colon. severe diverticulosis in recto-sigmoid colon and sigmoid colon. 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 CORONARY ARTERY BYPASS GRAFT     1995  . CYSTOSCOPY     with stent-left kidney. x2  . CYSTOSCOPY W/ URETERAL  STENT PLACEMENT Left 05/14/2017   Procedure: CYSTOSCOPY WITH RETROGRADE PYELOGRAM/URETERAL STENT EXCHANGE;  Surgeon: Cleon Gustin, MD;  Location: [facility];  Service: Urology;  Laterality: Left;  . CYSTOSCOPY W/ URETERAL STENT PLACEMENT Left 10/13/2017   Procedure: CYSTOSCOPY WITH LEFT RETROGRADE PYELOGRAM/LEFT URETERAL STENT REPLACEMENT;  Surgeon: Cleon Gustin, MD;  Location: [facility];  Service: Urology;  Laterality: Left;  . CYSTOSCOPY W/ URETERAL STENT PLACEMENT Left 01/07/2018   Procedure: CYSTOSCOPY WITH RETROGRADE PYELOGRAM/URETERAL STENT PLACEMENT;  Surgeon: Cleon Gustin, MD;  Location: [facility];  Service: Urology;  Laterality: Left;  30 MINS STENT "REPLACEMENT" [phone] MEDICARE-[id number]  . CYSTOSCOPY W/ URETERAL STENT PLACEMENT Left 04/22/2018   Procedure: CYSTOSCOPY WITH LEFT RETROGRADE PYELOGRAM/LEFT URETERAL STENT PLACEMENT, STENT EXCHANGE;  Surgeon: Cleon Gustin, MD;  Location: [facility];  Service: Urology;  Laterality: Left;  . CYSTOSCOPY W/ URETERAL STENT PLACEMENT Left 07/29/2018   Procedure: CYSTOSCOPY WITH RETROGRADE PYELOGRAM/URETERAL STENT EXCHANGE;  Surgeon: Cleon Gustin, MD;  Location: [facility];  Service: Urology;  Laterality: Left;  30 MINS  STENT EXCHANGE  . CYSTOSCOPY W/ URETERAL STENT PLACEMENT Left 11/04/2018   Procedure: CYSTOSCOPY WITH LEFT RETROGRADE PYELOGRAM; LEFT URETERAL STENT EXCHANGE;  Surgeon: Cleon Gustin, MD;  Location: [facility];  Service: Urology;  Laterality: Left;  . CYSTOSCOPY W/ URETERAL STENT PLACEMENT Left 02/17/2019   Procedure: CYSTOSCOPY WITH RETROGRADE PYELOGRAM/URETERAL STENT exchange;  Surgeon: Cleon Gustin, MD;  Location: [facility];  Service: Urology;  Laterality: Left;  30 MINS  . CYSTOSCOPY WITH FULGERATION  08/13/2017   Procedure: CYSTOSCOPY WITH FULGERATION OF BLADDER;  Surgeon: Cleon Gustin, MD;  Location: [facility];  Service: Urology;;  . Consuela Mimes WITH RETROGRADE PYELOGRAM,  URETEROSCOPY AND STENT PLACEMENT Left 08/13/2017   Procedure: CYSTOSCOPY WITH LEFT RETROGRADE PYELOGRAM, LEFT URETEROSCOPY AND STENT REPLACEMENT;  Surgeon: Cleon Gustin, MD;  Location: [facility];  Service: Urology;  Laterality: Left;  . DESCENDING AORTIC ANEURYSM REPAIR W/ STENT     x2  . ESOPHAGOGASTRODUODENOSCOPY N/A 07/30/2016   Dr. Oneida Alar. Nonobstructing Schatzki ring at GE junction, multiple small sessile polyps with no bleeding or stigmata of recent bleeding in the gastric fundus and gastric body. Benign-appearing intrinsic moderate stenosis found the pylorus status post dilation. Small bowel capsule deployed.  Marland Kitchen GIVENS CAPSULE STUDY     Capsule study is complete to the cecum. No obvious lesions, mass, tumors. Small wisps of blood noted in small bowel secondary to EGD/dilation. Possible few erosions in setting of NSAIDs. No obvious areas of bleeding.  Marland Kitchen GIVENS CAPSULE STUDY N/A 11/29/2017   Dr. Laural Golden: no evidence of active bleeding in stomach or small bowel. Multiple gastric petechia, single small AVM in proximal small bowel, scattered petechia involving small bowel mucosa.   Marland Kitchen HERNIA REPAIR    . HOLMIUM LASER APPLICATION Left 04/17/3789   Procedure: HOLMIUM LASER LITHOTRIPSY OF ENCRUSTED LEFT URETERAL STENT;  Surgeon: Cleon Gustin, MD;  Location: [facility];  Service: Urology;  Laterality: Left;  . SPINE SURGERY     ruptured discs    Home Medications:  Current Facility-Administered Medications  Medication Dose Route Frequency Provider Last Rate Last Dose  . ceFAZolin (ANCEF) IVPB 2g/100 mL premix  2 g Intravenous 30 min Pre-Op McKenzie, Candee Furbish, MD      . sterile water for irrigation for irrigation    PRN Alyson Ingles Candee Furbish, MD   500 mL at 08/04/19 1348   Allergies:  Allergies  Allergen Reactions  . Penicillins Rash and Other (See Comments)    Happened in [phone] Has patient had a PCN reaction causing immediate rash, facial/tongue/throat swelling, SOB or lightheaddness with  hypotension: Yes - pt states he "just broke out a little but" after taking Penicillin Has patient had a PCN reaction causing severe rash involving mucus membranes or skin necrosis: Unknown Has patient had a PCN reaction that required hospitalization: No Has patient had a PCN reaction occurring within the last 10 years: No     Family History  Problem Relation Age of Onset  . Heart disease Other   . Diabetes Other   . Hypertension Other   . Liver cancer Mother   . Arthritis Mother   . Cancer Mother        died at 73  . Heart disease Mother   . Alzheimer's disease Father   . Cancer Brother        lung  . Cancer Son 108       lung  . Hepatitis Brother   . Alcohol abuse Brother   . Heart disease Son 30       heart attack, stents  . Diabetes Son   . Colon cancer Neg Hx    Social History:  reports that he quit smoking about 25 years ago. His smoking use included cigarettes. He started smoking about 68 years ago. He has a 135.00 pack-year smoking history. He has never used smokeless tobacco. He reports  current alcohol use. He reports that he does not use drugs.  Review of Systems  Genitourinary: Positive for frequency and urgency.  All other systems reviewed and are negative.   Physical Exam:  Vital signs in last 24 hours: Temp:  [98.1 F (36.7 C)] 98.1 F (36.7 C) (08/05 1213) Pulse Rate:  [55] 55 (08/05 1213) Resp:  [14] 14 (08/05 1213) BP: (135)/(60) 135/60 (08/05 1213) Weight:  [74.8 kg] 74.8 kg (08/05 1213) Physical Exam  Constitutional: He is oriented to person, place, and time. He appears well-developed and well-nourished.  HENT:  Head: Normocephalic and atraumatic.  Eyes: Pupils are equal, round, and reactive to light. EOM are normal.  Neck: Normal range of motion. No thyromegaly present.  Cardiovascular: Normal rate and regular rhythm.  Respiratory: Effort normal. No respiratory distress.  GI: Soft. He exhibits no distension.  Musculoskeletal: Normal range of  motion.        General: No edema.  Neurological: He is alert and oriented to person, place, and time.  Skin: Skin is warm and dry.  Psychiatric: He has a normal mood and affect. His behavior is normal. Judgment and thought content normal.    Laboratory Data:  Results for orders placed or performed during the hospital encounter of 08/04/19 (from the past 24 hour(s))  Glucose, capillary     Status: Abnormal   Collection Time: 08/04/19 12:08 PM  Result Value Ref Range   Glucose-Capillary 102 (H) 70 - 99 mg/dL   Recent Results (from the past 240 hour(s))  SARS CORONAVIRUS 2 Nasal Swab Aptima Multi Swab     Status: None   Collection Time: 08/02/19  6:57 AM   Specimen: Aptima Multi Swab; Nasal Swab  Result Value Ref Range Status   SARS Coronavirus 2 NEGATIVE NEGATIVE Final    Comment: (NOTE) SARS-CoV-2 target nucleic acids are NOT DETECTED. The SARS-CoV-2 RNA is generally detectable in upper and lower respiratory specimens during the acute phase of infection. Negative results do not preclude SARS-CoV-2 infection, do not rule out co-infections with other pathogens, and should not be used as the sole basis for treatment or other patient management decisions. Negative results must be combined with clinical observations, patient history, and epidemiological information. The expected result is Negative. Fact Sheet for Patients: SugarRoll.be Fact Sheet for Healthcare Providers: https://www.woods-mathews.com/ This test is not yet approved or cleared by the [place] FDA and  has been authorized for detection and/or diagnosis of SARS-CoV-2 by FDA under an Emergency Use Authorization (EUA). This EUA will remain  in effect (meaning this test can be used) for the duration of the COVID-19 declaration under Section 56 4(b)(1) of the Act, 21 U.S.C. section 360bbb-3(b)(1), unless the authorization is terminated or revoked sooner. Performed at [facility], [place] [address], [place], [place] [address]    Creatinine: Recent Labs    08/02/19 1123  CREATININE 4.20*   Baseline Creatinine: 2  Impression/Assessment:  85yo with left ureteral stricture  Plan:  The risks/benefits/alternatives to left ureteral stent exchange was explained to the patient and he understands and wishes to proceed with surgery  Nicolette Bang 08/04/2019, 1:51 PM

## 2019-08-04 NOTE — Discharge Instructions (Signed)
Ureteral Stent Implantation, Care After °This sheet gives you information about how to care for yourself after your procedure. Your health care provider may also give you more specific instructions. If you have problems or questions, contact your health care provider. °What can I expect after the procedure? °After the procedure, it is common to have: °· Nausea. °· Mild pain when you urinate. You may feel this pain in your lower back or lower abdomen. The pain should stop within a few minutes after you urinate. This may last for up to 1 week. °· A small amount of blood in your urine for several days. °Follow these instructions at home: °Medicines °· Take over-the-counter and prescription medicines only as told by your health care provider. °· If you were prescribed an antibiotic medicine, take it as told by your health care provider. Do not stop taking the antibiotic even if you start to feel better. °· Do not drive for 24 hours if you were given a sedative during your procedure. °· Ask your health care provider if the medicine prescribed to you requires you to avoid driving or using heavy machinery. °Activity °· Rest as told by your health care provider. °· Avoid sitting for a long time without moving. Get up to take short walks every 1-2 hours. This is important to improve blood flow and breathing. Ask for help if you feel weak or unsteady. °· Return to your normal activities as told by your health care provider. Ask your health care provider what activities are safe for you. °General instructions ° °· Watch for any blood in your urine. Call your health care provider if the amount of blood in your urine increases. °· If you have a catheter: °? Follow instructions from your health care provider about taking care of your catheter and collection bag. °? Do not take baths, swim, or use a hot tub until your health care provider approves. Ask your health care provider if you may take showers. You may only be allowed to  take sponge baths. °· Drink enough fluid to keep your urine pale yellow. °· Do not use any products that contain nicotine or tobacco, such as cigarettes, e-cigarettes, and chewing tobacco. These can delay healing after surgery. If you need help quitting, ask your health care provider. °· Keep all follow-up visits as told by your health care provider. This is important. °Contact a health care provider if: °· You have pain that gets worse or does not get better with medicine, especially pain when you urinate. °· You have difficulty urinating. °· You feel nauseous or you vomit repeatedly during a period of more than 2 days after the procedure. °Get help right away if: °· Your urine is dark red or has blood clots in it. °· You are leaking urine (have incontinence). °· The end of the stent comes out of your urethra. °· You cannot urinate. °· You have sudden, sharp, or severe pain in your abdomen or lower back. °· You have a fever. °· You have swelling or pain in your legs. °· You have difficulty breathing. °Summary °· After the procedure, it is common to have mild pain when you urinate that goes away within a few minutes after you urinate. This may last for up to 1 week. °· Watch for any blood in your urine. Call your health care provider if the amount of blood in your urine increases. °· Take over-the-counter and prescription medicines only as told by your health care provider. °· Drink   enough fluid to keep your urine pale yellow. °This information is not intended to replace advice given to you by your health care provider. Make sure you discuss any questions you have with your health care provider. °Document Released: 08/18/2013 Document Revised: 09/22/2018 Document Reviewed: 09/23/2018 °Elsevier Patient Education © 2020 Elsevier Inc. ° ° °General Anesthesia, Adult, Care After °This sheet gives you information about how to care for yourself after your procedure. Your health care provider may also give you more specific  instructions. If you have problems or questions, contact your health care provider. °What can I expect after the procedure? °After the procedure, the following side effects are common: °· Pain or discomfort at the IV site. °· Nausea. °· Vomiting. °· Sore throat. °· Trouble concentrating. °· Feeling cold or chills. °· Weak or tired. °· Sleepiness and fatigue. °· Soreness and body aches. These side effects can affect parts of the body that were not involved in surgery. °Follow these instructions at home: ° °For at least 24 hours after the procedure: °· Have a responsible adult stay with you. It is important to have someone help care for you until you are awake and alert. °· Rest as needed. °· Do not: °? Participate in activities in which you could fall or become injured. °? Drive. °? Use heavy machinery. °? Drink alcohol. °? Take sleeping pills or medicines that cause drowsiness. °? Make important decisions or sign legal documents. °? Take care of children on your own. °Eating and drinking °· Follow any instructions from your health care provider about eating or drinking restrictions. °· When you feel hungry, start by eating small amounts of foods that are soft and easy to digest (bland), such as toast. Gradually return to your regular diet. °· Drink enough fluid to keep your urine pale yellow. °· If you vomit, rehydrate by drinking water, juice, or clear broth. °General instructions °· If you have sleep apnea, surgery and certain medicines can increase your risk for breathing problems. Follow instructions from your health care provider about wearing your sleep device: °? Anytime you are sleeping, including during daytime naps. °? While taking prescription pain medicines, sleeping medicines, or medicines that make you drowsy. °· Return to your normal activities as told by your health care provider. Ask your health care provider what activities are safe for you. °· Take over-the-counter and prescription medicines only  as told by your health care provider. °· If you smoke, do not smoke without supervision. °· Keep all follow-up visits as told by your health care provider. This is important. °Contact a health care provider if: °· You have nausea or vomiting that does not get better with medicine. °· You cannot eat or drink without vomiting. °· You have pain that does not get better with medicine. °· You are unable to pass urine. °· You develop a skin rash. °· You have a fever. °· You have redness around your IV site that gets worse. °Get help right away if: °· You have difficulty breathing. °· You have chest pain. °· You have blood in your urine or stool, or you vomit blood. °Summary °· After the procedure, it is common to have a sore throat or nausea. It is also common to feel tired. °· Have a responsible adult stay with you for the first 24 hours after general anesthesia. It is important to have someone help care for you until you are awake and alert. °· When you feel hungry, start by eating small amounts of   foods that are soft and easy to digest (bland), such as toast. Gradually return to your regular diet. °· Drink enough fluid to keep your urine pale yellow. °· Return to your normal activities as told by your health care provider. Ask your health care provider what activities are safe for you. °This information is not intended to replace advice given to you by your health care provider. Make sure you discuss any questions you have with your health care provider. °Document Released: 03/24/2001 Document Revised: 12/19/2017 Document Reviewed: 08/01/2017 °Elsevier Patient Education © 2020 Elsevier Inc. ° ° °

## 2019-08-04 NOTE — Transfer of Care (Signed)
Immediate Anesthesia Transfer of Care Note  Patient: Andrew Zhang  Procedure(s) Performed: CYSTOSCOPY WITH RETROGRADE PYELOGRAM/URETERAL STENT PLACEMENT (Left )  Patient Location: PACU  Anesthesia Type:General  Level of Consciousness: awake  Airway & Oxygen Therapy: Patient Spontanous Breathing  Post-op Assessment: Report given to RN  Post vital signs: Reviewed and stable  Last Vitals:  Vitals Value Taken Time  BP 158/77 08/04/19 1439  Temp    Pulse 67 08/04/19 1440  Resp 16 08/04/19 1440  SpO2 99 % 08/04/19 1440  Vitals shown include unvalidated device data.  Last Pain:  Vitals:   08/04/19 1213  TempSrc: Oral      Patients Stated Pain Goal: 7 (53/31/74 0992)  Complications: No apparent anesthesia complications

## 2019-08-04 NOTE — Anesthesia Preprocedure Evaluation (Signed)
Anesthesia Evaluation  Patient identified by MRN, date of birth, ID band Patient awake    Reviewed: Allergy & Precautions, NPO status , Patient's Chart, lab work & pertinent test results, reviewed documented beta blocker date and time   History of Anesthesia Complications (+) PONV and history of anesthetic complications  Airway Mallampati: III  TM Distance: >3 FB Neck ROM: Full    Dental  (+) Upper Dentures, Partial Lower, Loose,    Pulmonary sleep apnea (unable to use CPAP) , former smoker,    breath sounds clear to auscultation       Cardiovascular Exercise Tolerance: Good METS: 3 - Mets hypertension (took metoprolol around 9am), Pt. on medications and Pt. on home beta blockers + CAD, + CABG and + Peripheral Vascular Disease   Rhythm:Regular Rate:Normal  Sinus  Rhythm  -First degree A-V block  -Frequent pvcs -ventricular bigeminy  PRi = 282 -WPW pattern. ABNORMAL  Stress test - 08/2014- normal   Neuro/Psych PSYCHIATRIC DISORDERS Depression    GI/Hepatic hiatal hernia, GERD  Medicated and Controlled,  Endo/Other  diabetes, Type 2  Renal/GU Renal InsufficiencyRenal disease     Musculoskeletal  (+) Arthritis ,   Abdominal   Peds  Hematology  (+) anemia ,   Anesthesia Other Findings   Reproductive/Obstetrics                             Anesthesia Physical Anesthesia Plan  ASA: III  Anesthesia Plan: General   Post-op Pain Management:    Induction:   PONV Risk Score and Plan:   Airway Management Planned: LMA  Additional Equipment:   Intra-op Plan:   Post-operative Plan: Extubation in OR  Informed Consent: I have reviewed the patients History and Physical, chart, labs and discussed the procedure including the risks, benefits and alternatives for the proposed anesthesia with the patient or authorized representative who has indicated his/her understanding and acceptance.      Dental advisory given  Plan Discussed with:   Anesthesia Plan Comments:         Anesthesia Quick Evaluation

## 2019-08-04 NOTE — Op Note (Signed)
Preoperative diagnosis:left ureteral stricture  Postoperative diagnosis:same  Procedure: 1 cystoscopy 2. Left retrograde pyelography 3. Intraoperative fluoroscopy, under one hour, with interpretation 4. Left 7x 24JJ stent exchange  Attending: Nicolette Bang  Anesthesia: General  Estimated blood loss: None  Drains: Left 7x 24JJ ureteral stent without tether  Specimens:none  Antibiotics: ancef  Findings:moderateto severe lefthydronephrosis.Stent not encrusted.No masses/lesions in the bladder. Ureteral orifices in normal anatomic location.  Indications: Patient is a 85 year old male with a history a left mid ureteral stricture managed with an indwelling stent. After discussing treatment options, he decided proceed with leftstent exchange.  Procedure in detail: The patient was brought to the operating room and a brief timeout was done to ensure correct patient, correct procedure, correct site. General anesthesia was administered patient was placed in dorsal lithotomy position. Hisgenitalia was then prepped and draped in usual sterile fashion. A rigid 71 French cystoscope was passed in the urethra and the bladder. Bladder was inspected free masses or lesions. the ureteral orifices were in the normal orthotopic locations. a 6 french ureteral catheter was then instilled into the left ureteral orifice. a gentle retrograde was obtained and findings noted above. We then used a grasper to bring the ureteral stent to the urethral meatus.we then placed asensorwire through the ureteral stentand advanced up to the renal pelvis. We then removed the stentwe then placed a 7x 24double-j ureteral stent over the original sensorwire. We then removed the wire and good coil was noted in the the renal pelvis under fluoroscopy and the bladder under direct vision. the bladder was then drained and this concluded the procedure which was well tolerated by  patient.  Complications: None  Condition: Stable, extubated, transferred to PACU  Plan: Patient is to be discharged home as to follow-up in 4 weeks with a renal US.

## 2019-08-04 NOTE — Anesthesia Postprocedure Evaluation (Signed)
Anesthesia Post Note  Patient: Andrew Zhang  Procedure(s) Performed: CYSTOSCOPY WITH RETROGRADE PYELOGRAM/URETERAL STENT PLACEMENT (Left )  Patient location during evaluation: PACU Anesthesia Type: General Level of consciousness: awake and alert and oriented Pain management: pain level controlled Vital Signs Assessment: post-procedure vital signs reviewed and stable Respiratory status: spontaneous breathing Cardiovascular status: blood pressure returned to baseline and stable Postop Assessment: no apparent nausea or vomiting Anesthetic complications: no     Last Vitals:  Vitals:   08/04/19 1213 08/04/19 1438  BP: 135/60 (!) 158/77  Pulse: (!) 55 65  Resp: 14 13  Temp: 36.7 C 36.5 C  SpO2:  97%    Last Pain:  Vitals:   08/04/19 1438  TempSrc:   PainSc: 0-No pain                 Lattie Riege

## 2019-08-05 ENCOUNTER — Encounter (HOSPITAL_COMMUNITY): Payer: Self-pay | Admitting: Urology

## 2019-09-01 ENCOUNTER — Ambulatory Visit (INDEPENDENT_AMBULATORY_CARE_PROVIDER_SITE_OTHER): Payer: Medicare Other | Admitting: Urology

## 2019-09-01 DIAGNOSIS — N131 Hydronephrosis with ureteral stricture, not elsewhere classified: Secondary | ICD-10-CM | POA: Diagnosis not present

## 2019-09-01 DIAGNOSIS — N401 Enlarged prostate with lower urinary tract symptoms: Secondary | ICD-10-CM

## 2019-09-07 DIAGNOSIS — D500 Iron deficiency anemia secondary to blood loss (chronic): Secondary | ICD-10-CM | POA: Diagnosis not present

## 2019-09-09 DIAGNOSIS — Z66 Do not resuscitate: Secondary | ICD-10-CM | POA: Diagnosis not present

## 2019-09-09 DIAGNOSIS — C61 Malignant neoplasm of prostate: Secondary | ICD-10-CM | POA: Diagnosis not present

## 2019-09-09 DIAGNOSIS — E611 Iron deficiency: Secondary | ICD-10-CM | POA: Diagnosis not present

## 2019-09-09 DIAGNOSIS — D500 Iron deficiency anemia secondary to blood loss (chronic): Secondary | ICD-10-CM | POA: Diagnosis not present

## 2019-09-14 DIAGNOSIS — D500 Iron deficiency anemia secondary to blood loss (chronic): Secondary | ICD-10-CM | POA: Diagnosis not present

## 2019-09-14 DIAGNOSIS — D631 Anemia in chronic kidney disease: Secondary | ICD-10-CM | POA: Diagnosis not present

## 2019-09-14 DIAGNOSIS — N184 Chronic kidney disease, stage 4 (severe): Secondary | ICD-10-CM | POA: Diagnosis not present

## 2019-09-14 DIAGNOSIS — Z6822 Body mass index (BMI) 22.0-22.9, adult: Secondary | ICD-10-CM | POA: Diagnosis not present

## 2019-09-14 DIAGNOSIS — N189 Chronic kidney disease, unspecified: Secondary | ICD-10-CM | POA: Diagnosis not present

## 2019-10-07 DIAGNOSIS — D509 Iron deficiency anemia, unspecified: Secondary | ICD-10-CM | POA: Diagnosis not present

## 2019-10-13 DIAGNOSIS — Z6821 Body mass index (BMI) 21.0-21.9, adult: Secondary | ICD-10-CM | POA: Diagnosis not present

## 2019-10-13 DIAGNOSIS — N184 Chronic kidney disease, stage 4 (severe): Secondary | ICD-10-CM | POA: Diagnosis not present

## 2019-10-13 DIAGNOSIS — E119 Type 2 diabetes mellitus without complications: Secondary | ICD-10-CM | POA: Diagnosis not present

## 2019-11-08 DIAGNOSIS — D649 Anemia, unspecified: Secondary | ICD-10-CM | POA: Diagnosis not present

## 2019-11-12 DIAGNOSIS — N184 Chronic kidney disease, stage 4 (severe): Secondary | ICD-10-CM | POA: Diagnosis not present

## 2019-11-12 DIAGNOSIS — D631 Anemia in chronic kidney disease: Secondary | ICD-10-CM | POA: Diagnosis not present

## 2019-11-29 DIAGNOSIS — E1122 Type 2 diabetes mellitus with diabetic chronic kidney disease: Secondary | ICD-10-CM | POA: Diagnosis not present

## 2019-11-29 DIAGNOSIS — N184 Chronic kidney disease, stage 4 (severe): Secondary | ICD-10-CM | POA: Diagnosis not present

## 2019-11-29 DIAGNOSIS — C61 Malignant neoplasm of prostate: Secondary | ICD-10-CM | POA: Diagnosis not present

## 2019-11-29 DIAGNOSIS — R634 Abnormal weight loss: Secondary | ICD-10-CM | POA: Diagnosis not present

## 2019-11-29 DIAGNOSIS — N400 Enlarged prostate without lower urinary tract symptoms: Secondary | ICD-10-CM | POA: Diagnosis not present

## 2019-11-29 DIAGNOSIS — Z923 Personal history of irradiation: Secondary | ICD-10-CM | POA: Diagnosis not present

## 2019-11-29 DIAGNOSIS — Z801 Family history of malignant neoplasm of trachea, bronchus and lung: Secondary | ICD-10-CM | POA: Diagnosis not present

## 2019-11-29 DIAGNOSIS — D631 Anemia in chronic kidney disease: Secondary | ICD-10-CM | POA: Diagnosis not present

## 2019-11-29 DIAGNOSIS — K219 Gastro-esophageal reflux disease without esophagitis: Secondary | ICD-10-CM | POA: Diagnosis not present

## 2019-11-29 DIAGNOSIS — D500 Iron deficiency anemia secondary to blood loss (chronic): Secondary | ICD-10-CM | POA: Diagnosis not present

## 2019-11-29 DIAGNOSIS — Z9049 Acquired absence of other specified parts of digestive tract: Secondary | ICD-10-CM | POA: Diagnosis not present

## 2019-11-29 DIAGNOSIS — I2510 Atherosclerotic heart disease of native coronary artery without angina pectoris: Secondary | ICD-10-CM | POA: Diagnosis not present

## 2019-11-29 DIAGNOSIS — Z87891 Personal history of nicotine dependence: Secondary | ICD-10-CM | POA: Diagnosis not present

## 2019-11-29 DIAGNOSIS — Z880 Allergy status to penicillin: Secondary | ICD-10-CM | POA: Diagnosis not present

## 2019-11-29 DIAGNOSIS — E785 Hyperlipidemia, unspecified: Secondary | ICD-10-CM | POA: Diagnosis not present

## 2019-11-29 DIAGNOSIS — Z6820 Body mass index (BMI) 20.0-20.9, adult: Secondary | ICD-10-CM | POA: Diagnosis not present

## 2019-11-29 DIAGNOSIS — R109 Unspecified abdominal pain: Secondary | ICD-10-CM | POA: Diagnosis not present

## 2019-11-29 DIAGNOSIS — Z951 Presence of aortocoronary bypass graft: Secondary | ICD-10-CM | POA: Diagnosis not present

## 2019-11-29 DIAGNOSIS — I129 Hypertensive chronic kidney disease with stage 1 through stage 4 chronic kidney disease, or unspecified chronic kidney disease: Secondary | ICD-10-CM | POA: Diagnosis not present

## 2019-11-29 DIAGNOSIS — Z800 Family history of malignant neoplasm of digestive organs: Secondary | ICD-10-CM | POA: Diagnosis not present

## 2019-12-03 DIAGNOSIS — C61 Malignant neoplasm of prostate: Secondary | ICD-10-CM | POA: Diagnosis not present

## 2019-12-03 DIAGNOSIS — N184 Chronic kidney disease, stage 4 (severe): Secondary | ICD-10-CM | POA: Diagnosis not present

## 2019-12-03 DIAGNOSIS — N1330 Unspecified hydronephrosis: Secondary | ICD-10-CM | POA: Diagnosis not present

## 2019-12-03 DIAGNOSIS — R634 Abnormal weight loss: Secondary | ICD-10-CM | POA: Diagnosis not present

## 2019-12-03 DIAGNOSIS — K5730 Diverticulosis of large intestine without perforation or abscess without bleeding: Secondary | ICD-10-CM | POA: Diagnosis not present

## 2019-12-03 DIAGNOSIS — I700 Atherosclerosis of aorta: Secondary | ICD-10-CM | POA: Diagnosis not present

## 2019-12-03 DIAGNOSIS — K4020 Bilateral inguinal hernia, without obstruction or gangrene, not specified as recurrent: Secondary | ICD-10-CM | POA: Diagnosis not present

## 2019-12-05 DIAGNOSIS — Z87442 Personal history of urinary calculi: Secondary | ICD-10-CM | POA: Diagnosis not present

## 2019-12-05 DIAGNOSIS — Z833 Family history of diabetes mellitus: Secondary | ICD-10-CM | POA: Diagnosis not present

## 2019-12-05 DIAGNOSIS — K219 Gastro-esophageal reflux disease without esophagitis: Secondary | ICD-10-CM | POA: Diagnosis not present

## 2019-12-05 DIAGNOSIS — N329 Bladder disorder, unspecified: Secondary | ICD-10-CM | POA: Diagnosis not present

## 2019-12-05 DIAGNOSIS — Z8546 Personal history of malignant neoplasm of prostate: Secondary | ICD-10-CM | POA: Diagnosis not present

## 2019-12-05 DIAGNOSIS — N1330 Unspecified hydronephrosis: Secondary | ICD-10-CM | POA: Diagnosis not present

## 2019-12-05 DIAGNOSIS — R9341 Abnormal radiologic findings on diagnostic imaging of renal pelvis, ureter, or bladder: Secondary | ICD-10-CM | POA: Diagnosis not present

## 2019-12-05 DIAGNOSIS — Z8249 Family history of ischemic heart disease and other diseases of the circulatory system: Secondary | ICD-10-CM | POA: Diagnosis not present

## 2019-12-05 DIAGNOSIS — E875 Hyperkalemia: Secondary | ICD-10-CM | POA: Diagnosis not present

## 2019-12-05 DIAGNOSIS — Z79899 Other long term (current) drug therapy: Secondary | ICD-10-CM | POA: Diagnosis not present

## 2019-12-05 DIAGNOSIS — C679 Malignant neoplasm of bladder, unspecified: Secondary | ICD-10-CM | POA: Diagnosis not present

## 2019-12-05 DIAGNOSIS — R109 Unspecified abdominal pain: Secondary | ICD-10-CM | POA: Diagnosis not present

## 2019-12-05 DIAGNOSIS — N3289 Other specified disorders of bladder: Secondary | ICD-10-CM | POA: Diagnosis not present

## 2019-12-05 DIAGNOSIS — Z87891 Personal history of nicotine dependence: Secondary | ICD-10-CM | POA: Diagnosis not present

## 2019-12-05 DIAGNOSIS — Z923 Personal history of irradiation: Secondary | ICD-10-CM | POA: Diagnosis not present

## 2019-12-05 DIAGNOSIS — N3001 Acute cystitis with hematuria: Secondary | ICD-10-CM | POA: Diagnosis not present

## 2019-12-05 DIAGNOSIS — N189 Chronic kidney disease, unspecified: Secondary | ICD-10-CM | POA: Diagnosis not present

## 2019-12-05 DIAGNOSIS — Z880 Allergy status to penicillin: Secondary | ICD-10-CM | POA: Diagnosis not present

## 2019-12-05 DIAGNOSIS — R1031 Right lower quadrant pain: Secondary | ICD-10-CM | POA: Diagnosis not present

## 2019-12-05 DIAGNOSIS — I129 Hypertensive chronic kidney disease with stage 1 through stage 4 chronic kidney disease, or unspecified chronic kidney disease: Secondary | ICD-10-CM | POA: Diagnosis not present

## 2019-12-05 DIAGNOSIS — N401 Enlarged prostate with lower urinary tract symptoms: Secondary | ICD-10-CM | POA: Diagnosis not present

## 2019-12-05 DIAGNOSIS — N179 Acute kidney failure, unspecified: Secondary | ICD-10-CM | POA: Diagnosis not present

## 2019-12-05 DIAGNOSIS — K5790 Diverticulosis of intestine, part unspecified, without perforation or abscess without bleeding: Secondary | ICD-10-CM | POA: Diagnosis not present

## 2019-12-05 DIAGNOSIS — E1122 Type 2 diabetes mellitus with diabetic chronic kidney disease: Secondary | ICD-10-CM | POA: Diagnosis not present

## 2019-12-05 DIAGNOSIS — J439 Emphysema, unspecified: Secondary | ICD-10-CM | POA: Diagnosis not present

## 2019-12-06 DIAGNOSIS — R109 Unspecified abdominal pain: Secondary | ICD-10-CM | POA: Diagnosis not present

## 2019-12-06 DIAGNOSIS — N1330 Unspecified hydronephrosis: Secondary | ICD-10-CM | POA: Diagnosis not present

## 2019-12-07 DIAGNOSIS — N179 Acute kidney failure, unspecified: Secondary | ICD-10-CM | POA: Diagnosis not present

## 2019-12-07 DIAGNOSIS — N184 Chronic kidney disease, stage 4 (severe): Secondary | ICD-10-CM | POA: Diagnosis not present

## 2019-12-08 ENCOUNTER — Ambulatory Visit: Payer: Medicare Other | Admitting: Urology

## 2019-12-10 DIAGNOSIS — N179 Acute kidney failure, unspecified: Secondary | ICD-10-CM | POA: Diagnosis not present

## 2019-12-10 DIAGNOSIS — N1330 Unspecified hydronephrosis: Secondary | ICD-10-CM | POA: Diagnosis not present

## 2019-12-10 DIAGNOSIS — N390 Urinary tract infection, site not specified: Secondary | ICD-10-CM | POA: Diagnosis not present

## 2020-03-07 ENCOUNTER — Other Ambulatory Visit: Payer: Self-pay

## 2020-03-07 DIAGNOSIS — N401 Enlarged prostate with lower urinary tract symptoms: Secondary | ICD-10-CM

## 2020-03-07 MED ORDER — OXYBUTYNIN CHLORIDE 5 MG PO TABS: 5.0000 mg | ORAL_TABLET | Freq: Two times a day (BID) | ORAL | 11 refills | Status: AC

## 2020-06-02 ENCOUNTER — Other Ambulatory Visit: Payer: Self-pay | Admitting: Cardiovascular Disease

## 2020-06-02 NOTE — Telephone Encounter (Signed)
° ° ° °  1. Which medications need to be refilled? (please list name of each medication and dose if known)  metoprolol succinate (TOPROL XL) 25 MG 24 hr tablet [[id number]]    2. Which pharmacy/location (including street and city if local pharmacy) is medication to be sent to?   [facility]   3. Do they need a 30 day or 90 day supply?

## 2020-06-08 MED ORDER — METOPROLOL SUCCINATE ER 25 MG PO TB24: 25.0000 mg | ORAL_TABLET | Freq: Every day | ORAL | 2 refills | Status: AC

## 2020-06-08 NOTE — Addendum Note (Signed)
Addended by: Julian Hy T on: 06/08/2020 11:40 AM   Modules accepted: Orders

## 2020-08-09 ENCOUNTER — Ambulatory Visit: Payer: Medicare Other | Admitting: Cardiovascular Disease

## 2021-02-27 DEATH — deceased
